# Patient Record
Sex: Female | Born: 1940 | Race: White | Hispanic: No | Smoking: Former smoker
Health system: Southern US, Community
[De-identification: ages and names within clinical notes are randomized; demographics above are authoritative.]

## PROBLEM LIST (undated history)

## (undated) DIAGNOSIS — R911 Solitary pulmonary nodule: Secondary | ICD-10-CM

## (undated) DIAGNOSIS — I10 Essential (primary) hypertension: Secondary | ICD-10-CM

## (undated) DIAGNOSIS — I639 Cerebral infarction, unspecified: Secondary | ICD-10-CM

## (undated) DIAGNOSIS — E079 Disorder of thyroid, unspecified: Secondary | ICD-10-CM

## (undated) DIAGNOSIS — J45909 Unspecified asthma, uncomplicated: Secondary | ICD-10-CM

## (undated) DIAGNOSIS — IMO0001 Reserved for inherently not codable concepts without codable children: Secondary | ICD-10-CM

## (undated) HISTORY — PX: ABDOMINAL HYSTERECTOMY: SHX81

## (undated) HISTORY — PX: BREAST SURGERY: SHX581

## (undated) HISTORY — DX: Reserved for inherently not codable concepts without codable children: IMO0001

## (undated) HISTORY — DX: Cerebral infarction, unspecified: I63.9

## (undated) HISTORY — PX: VENTRICULOPERITONEAL SHUNT: SHX204

## (undated) HISTORY — DX: Solitary pulmonary nodule: R91.1

---

## 2005-10-25 ENCOUNTER — Ambulatory Visit: Payer: Self-pay | Admitting: Family Medicine

## 2005-11-11 ENCOUNTER — Ambulatory Visit: Payer: Self-pay | Admitting: Family Medicine

## 2005-11-14 ENCOUNTER — Ambulatory Visit: Payer: Self-pay | Admitting: Internal Medicine

## 2006-06-24 DIAGNOSIS — M545 Low back pain: Secondary | ICD-10-CM

## 2006-06-24 DIAGNOSIS — I10 Essential (primary) hypertension: Secondary | ICD-10-CM

## 2006-06-24 DIAGNOSIS — D510 Vitamin B12 deficiency anemia due to intrinsic factor deficiency: Secondary | ICD-10-CM | POA: Insufficient documentation

## 2006-06-24 DIAGNOSIS — R259 Unspecified abnormal involuntary movements: Secondary | ICD-10-CM | POA: Insufficient documentation

## 2006-06-24 DIAGNOSIS — E7800 Pure hypercholesterolemia, unspecified: Secondary | ICD-10-CM

## 2007-07-31 ENCOUNTER — Ambulatory Visit: Payer: Self-pay | Admitting: Family Medicine

## 2007-08-03 ENCOUNTER — Telehealth: Payer: Self-pay | Admitting: Family Medicine

## 2007-08-05 ENCOUNTER — Encounter: Admission: RE | Admit: 2007-08-05 | Discharge: 2007-08-05 | Payer: Self-pay | Admitting: Family Medicine

## 2007-08-18 ENCOUNTER — Ambulatory Visit: Payer: Self-pay | Admitting: Family Medicine

## 2007-08-25 ENCOUNTER — Telehealth: Payer: Self-pay | Admitting: Family Medicine

## 2007-08-25 ENCOUNTER — Telehealth (INDEPENDENT_AMBULATORY_CARE_PROVIDER_SITE_OTHER): Payer: Self-pay | Admitting: *Deleted

## 2007-08-25 DIAGNOSIS — R928 Other abnormal and inconclusive findings on diagnostic imaging of breast: Secondary | ICD-10-CM | POA: Insufficient documentation

## 2007-10-23 ENCOUNTER — Ambulatory Visit: Payer: Self-pay | Admitting: Family Medicine

## 2007-11-24 ENCOUNTER — Ambulatory Visit: Payer: Self-pay | Admitting: Family Medicine

## 2007-11-27 ENCOUNTER — Encounter: Payer: Self-pay | Admitting: Family Medicine

## 2007-11-27 ENCOUNTER — Telehealth (INDEPENDENT_AMBULATORY_CARE_PROVIDER_SITE_OTHER): Payer: Self-pay | Admitting: *Deleted

## 2007-11-30 ENCOUNTER — Encounter: Payer: Self-pay | Admitting: Family Medicine

## 2007-11-30 ENCOUNTER — Telehealth (INDEPENDENT_AMBULATORY_CARE_PROVIDER_SITE_OTHER): Payer: Self-pay | Admitting: *Deleted

## 2007-11-30 LAB — CONVERTED CEMR LAB
ALT: 18 units/L (ref 0–35)
AST: 15 units/L (ref 0–37)
Albumin: 4.5 g/dL (ref 3.5–5.2)
Alkaline Phosphatase: 54 units/L (ref 39–117)
Chloride: 95 meq/L — ABNORMAL LOW (ref 96–112)
Estradiol: 58.6 pg/mL
Free T4: 0.68 ng/dL — ABNORMAL LOW (ref 0.89–1.80)
LDL Cholesterol: 144 mg/dL — ABNORMAL HIGH (ref 0–99)
MCHC: 33.7 g/dL (ref 30.0–36.0)
Platelets: 335 10*3/uL (ref 150–400)
Potassium: 4.2 meq/L (ref 3.5–5.3)
RDW: 13.2 % (ref 11.5–15.5)
Sodium: 131 meq/L — ABNORMAL LOW (ref 135–145)
T3, Free: 2.8 pg/mL (ref 2.3–4.2)
TSH: 9.265 microintl units/mL — ABNORMAL HIGH (ref 0.350–5.50)
Total CHOL/HDL Ratio: 5.3
Total Protein: 7.1 g/dL (ref 6.0–8.3)
VLDL: 48 mg/dL — ABNORMAL HIGH (ref 0–40)

## 2007-12-04 ENCOUNTER — Ambulatory Visit: Payer: Self-pay | Admitting: Family Medicine

## 2007-12-04 DIAGNOSIS — E871 Hypo-osmolality and hyponatremia: Secondary | ICD-10-CM | POA: Insufficient documentation

## 2007-12-04 DIAGNOSIS — E039 Hypothyroidism, unspecified: Secondary | ICD-10-CM | POA: Insufficient documentation

## 2007-12-05 ENCOUNTER — Encounter: Payer: Self-pay | Admitting: Family Medicine

## 2007-12-07 LAB — CONVERTED CEMR LAB: Vitamin B-12: 1528 pg/mL — ABNORMAL HIGH (ref 211–911)

## 2007-12-15 ENCOUNTER — Telehealth (INDEPENDENT_AMBULATORY_CARE_PROVIDER_SITE_OTHER): Payer: Self-pay | Admitting: *Deleted

## 2007-12-18 ENCOUNTER — Ambulatory Visit: Payer: Self-pay | Admitting: Family Medicine

## 2008-01-08 ENCOUNTER — Ambulatory Visit: Payer: Self-pay | Admitting: Family Medicine

## 2008-01-08 DIAGNOSIS — R5381 Other malaise: Secondary | ICD-10-CM | POA: Insufficient documentation

## 2008-01-08 DIAGNOSIS — F329 Major depressive disorder, single episode, unspecified: Secondary | ICD-10-CM

## 2008-01-08 DIAGNOSIS — R5383 Other fatigue: Secondary | ICD-10-CM

## 2008-01-11 ENCOUNTER — Telehealth: Payer: Self-pay | Admitting: Family Medicine

## 2008-01-12 LAB — CONVERTED CEMR LAB: Testosterone Free: 6.5 pg/mL (ref 0.6–6.8)

## 2008-11-03 ENCOUNTER — Telehealth: Payer: Self-pay | Admitting: Family Medicine

## 2008-11-04 ENCOUNTER — Ambulatory Visit: Payer: Self-pay | Admitting: Family Medicine

## 2009-03-07 ENCOUNTER — Encounter: Payer: Self-pay | Admitting: Family Medicine

## 2009-03-07 LAB — CONVERTED CEMR LAB: Cholesterol: 211 mg/dL

## 2009-10-06 ENCOUNTER — Ambulatory Visit: Payer: Self-pay | Admitting: Diagnostic Radiology

## 2009-10-06 ENCOUNTER — Emergency Department (HOSPITAL_BASED_OUTPATIENT_CLINIC_OR_DEPARTMENT_OTHER): Admission: EM | Admit: 2009-10-06 | Discharge: 2009-10-06 | Payer: Self-pay | Admitting: Emergency Medicine

## 2009-10-16 ENCOUNTER — Ambulatory Visit: Payer: Self-pay | Admitting: Family Medicine

## 2010-02-27 ENCOUNTER — Encounter: Payer: Self-pay | Admitting: Family Medicine

## 2010-10-16 NOTE — Assessment & Plan Note (Signed)
Summary: Breast lesion, mood, HTN   Vital Signs:  Patient profile:   70 year old female Height:      64 inches Weight:      166 pounds BMI:     28.60 Pulse rate:   85 / minute BP sitting:   120 / 67  (left arm) Cuff size:   regular  Vitals Entered By: Kathlene November (October 16, 2009 10:59 AM) CC: cyst on right breast   Primary Care Provider:  Linford Arnold, C  CC:  cyst on right breast.  History of Present Illness: cyst on right breast.  Had a bx on this lesions previously that was benign. Has had a knot there ever since the bx. Feels it may be changing but not sure.  Last mammo in  2009. No pain or tenderness.    Has lost 13 pounds without  exercising. Getting over bronchitis adn would like me to check her lungs. COmpleted her ABX. Also given steroid but never had that rx filled because her it could cause sexual dysfunction.   Has been feelingmore anxious. Her renter in [place] broke their rental contract and so now she has to go adn work on the house adn try to find another renter. Already on clonazepam for sleep but says has been using someduring the day, it just makes her feel sleepy. Says she can't remember who really prescibes that med for her.   Contraindications/Deferment of Procedures/Staging:    Test/Procedure: FLU VAX    Reason for deferment: patient declined   Current Medications (verified): 1)  Clonazepam 0.5 Mg  Tabs (Clonazepam) 2)  Hyrdochlorothiazide .... Take One Tablet By Mouth Daily 3)  Lisinopril .... Take 1/2 Tablet By Mouth Once A Day 4)  Depo-Estradiol 5 Mg/ml  Oil (Estradiol Cypionate) .... 0.5mg  Im Q 3-4 Weeks. 5)  Synthroid 50 Mcg  Tabs (Levothyroxine Sodium) .... Take 1 Tablet By Mouth Once A Day 6)  Lipitor 40 Mg Tab (Atorvastatin Calcium) .... Take 1 Tablet By Mouth Each Evening 7)  Naltrexone Hcl 50 Mg Tabs (Naltrexone Hcl) .... Take One Tablet By Mouth At Bedtime 8)  Fish Oil Capsules 2000mg  .... Take One Tablet By Mouth Ion The Mornings and One Tablet By  Mouth in The Evenings 9)  Testim 1 % Gel (Testosterone) .... Apply 1/4 Cc Daily  Allergies (verified): 1)  ! Penicillamine (Penicillamine) 2)  ! Sulfa  Comments:  Nurse/Medical Assistant: The patient's medications and allergies were reviewed with the patient and were updated in the Medication and Allergy Lists.  Past History:  Past Surgical History: Last updated: 07/01/2006 breast reduction,  Hystectomy and oophorectomy age 56  Physical Exam  General:  Well-developed,well-nourished,in no acute distress; alert,appropriate and cooperative throughout examination Head:  Normocephalic and atraumatic without obvious abnormalities. No apparent alopecia or balding. Breasts:  No mass, nodules, thickening, tenderness, bulging, retraction, inflamation, nipple discharge notes. She does have dense fibrocystic breats and does have some well healed scars from her breast reduction.  Lungs:  Normal respiratory effort, chest expands symmetrically. Lungs are clear to auscultation, no crackles or wheezes. Heart:  Normal rate and regular rhythm. S1 and S2 normal without gallop, murmur, click, rub or other extra sounds. Skin:  no rashes.   Psych:  Cognition and judgment appear intact. Alert and cooperative with normal attention span and concentration. No apparent delusions, illusions, hallucinations   Impression & Recommendations:  Problem # 1:  ABNORMAL MAMMOGRAM (ICD-793.80)  Dsicussed that I don't feel any changes on her exam but  she is 2 years overdue for her mammogram. Will refer for diagnostic mammogram for further imaging of the area since she feels is has changed. Area is in the right breaset,  Upper inner quadrant that she is worried about.   Orders: T-Mammography, Diagnostic (bilateral) (16109)  Problem # 2:  DEPRESSION (ICD-311) Discussed that we could start an SSR if she would like to control her sxs. I don't rx her clonazepam so will have to call the provider who writes that for her if  feels her dose needs to be adjusted. Let her know if may take 2-3 weeks for the citalopram to start working. Have to be patient with it. calli fsedating or drastic change in mood.  Her updated medication list for this problem includes:    Clonazepam 0.5 Mg Tabs (Clonazepam)    Citalopram Hydrobromide 20 Mg Tabs (Citalopram hydrobromide) .Marland Kitchen... 1/2 tab by mouth daily  for one week, then increase to whole tab daily  Problem # 3:  HYPERTENSION, BENIGN SYSTEMIC (ICD-401.1) Looks great. She brought in a copy of her most recent labs today. I am not really sure who is following this for her. She has multiple MDs, but her last K, CR in June were normal per her labs and she is well controlled.  Her updated medication list for this problem includes:    Lisinopril 10 Mg Tabs (Lisinopril) .Marland Kitchen... Take 1/2 tablet by mouth once a day  Complete Medication List: 1)  Clonazepam 0.5 Mg Tabs (Clonazepam) 2)  Hyrdochlorothiazide  .... Take one tablet by mouth daily 3)  Lisinopril 10 Mg Tabs (Lisinopril) .... Take 1/2 tablet by mouth once a day 4)  Depo-estradiol 5 Mg/ml Oil (Estradiol cypionate) .... 0.5mg  im q 3-4 weeks. 5)  Synthroid 50 Mcg Tabs (Levothyroxine sodium) .... Take 1 tablet by mouth once a day 6)  Lipitor 40 Mg Tab (Atorvastatin calcium) .... Take 1 tablet by mouth each evening 7)  Naltrexone Hcl 50 Mg Tabs (Naltrexone hcl) .... Take one tablet by mouth at bedtime 8)  Fish Oil Capsules 2000mg   .... Take one tablet by mouth ion the mornings and one tablet by mouth in the evenings 9)  Testim 1 % Gel (Testosterone) .... Apply 1/4 cc daily 10)  Citalopram Hydrobromide 20 Mg Tabs (Citalopram hydrobromide) .... 1/2 tab by mouth daily  for one week, then increase to whole tab daily  Other Orders: Pneumococcal Vaccine (60454) Admin 1st Vaccine (09811) Prescriptions: CITALOPRAM HYDROBROMIDE 20 MG TABS (CITALOPRAM HYDROBROMIDE) 1/2 tab by mouth daily  for one week, then increase to whole tab daily  #30 x 0    Entered and Authorized by:   Nani Gasser MD   Signed by:   Nani Gasser MD on 10/16/2009   Method used:   Print then Give to Patient   RxID:   [id number]   Flu Vaccine Next Due:  Not Indicated TD Result Date:  10/02/2009 TD Result:  given TD Next Due:  10 yr PAP Next Due:  Not Indicated   Immunizations Administered:  Pneumonia Vaccine:    Vaccine Type: Pneumovax    Site: right buttock    Mfr: Merck    Dose: 0.5 ml    Route: IM    Given by: Kathlene November    Exp. Date: 10/13/2010    Lot #: [id number]    VIS given: 04/13/96 version given October 16, 2009.   Lipid Panel Test Date: 03/07/2009  Value        Units        H/L   Reference  Cholesterol:          211          mg/dL              (045-409) LDL Cholesterol:      115          mg/dL              (81-191) HDL Cholesterol:      48           mg/dL              (47-82) Triglyceride:         239          mg/dL              (95-621)  Lab name:             [facility]

## 2011-01-10 ENCOUNTER — Encounter: Payer: Self-pay | Admitting: Family Medicine

## 2011-01-10 ENCOUNTER — Telehealth: Payer: Self-pay | Admitting: Family Medicine

## 2011-01-10 NOTE — Telephone Encounter (Signed)
Pt notified and was advised to try allegra and otc cortisone

## 2011-01-10 NOTE — Telephone Encounter (Signed)
Patient has an appt tomorrow for itching but is there something she can take OTC til tomorrow?

## 2011-01-11 ENCOUNTER — Ambulatory Visit (INDEPENDENT_AMBULATORY_CARE_PROVIDER_SITE_OTHER): Payer: Medicare Other | Admitting: Family Medicine

## 2011-01-11 ENCOUNTER — Encounter: Payer: Self-pay | Admitting: Family Medicine

## 2011-01-11 VITALS — BP 127/78 | HR 95 | Temp 98.3°F | Ht 64.0 in | Wt 175.0 lb

## 2011-01-11 DIAGNOSIS — L282 Other prurigo: Secondary | ICD-10-CM

## 2011-01-11 NOTE — Patient Instructions (Addendum)
Stop the nexium and go back to your prilosec for now.   She can schedule a physical any time.

## 2011-01-11 NOTE — Progress Notes (Signed)
  Subjective:    Patient ID: Kathleen Dunn, female    DOB: 26-Apr-1941, 70 y.o.   MRN: [id number]  HPI Iting on her legs, abdomen, neck and upper legs for over 2 weeks.  No rash. No new lotions, soaps, perfumes, fabric softeners, etc.  She is taking vitamin D. Start nexium on 11/29/10.  She is not using any creams or lotions on it. No worsening or alleviating symptoms.  She is also seeing a naturopath. She ws put on Betaine but had a reaction, but can't remember what the reaction was.  She has had a lot of gas and bloating.  She is already taking vit D 1000 and a MVI.     Review of Systems     Objective:   Physical Exam  Constitutional: She appears well-developed and well-nourished.  Cardiovascular: Normal rate, regular rhythm and normal heart sounds.   Pulmonary/Chest: Effort normal and breath sounds normal.  Skin:       Very faint erythematous macules on her upper chest neck and arms. Several excoriations especially of her forearms and around her neck.   BP 127/78  Pulse 95  Temp(Src) 98.3 F (36.8 C) (Oral)  Ht 5\' 4"  (1.626 m)  Wt 175 lb (79.379 kg)  BMI 30.04 kg/m2  SpO2 96%        Assessment & Plan:  Likely hives from some type of allergic reaction. Interestingly this did start after starting the Prilosec but doesn't quite look like a drug reaction. Certainly it could be causing itching. I recommended that she get back to her omeprazole for now. The rash seems to be getting better the last week so she can continue conservative care. It is not completely resolved in the next 10 days and she cannot wean.

## 2011-01-14 ENCOUNTER — Encounter: Payer: Self-pay | Admitting: Family Medicine

## 2011-05-17 ENCOUNTER — Telehealth: Payer: Self-pay | Admitting: Family Medicine

## 2011-05-17 ENCOUNTER — Ambulatory Visit (INDEPENDENT_AMBULATORY_CARE_PROVIDER_SITE_OTHER): Payer: Medicare Other | Admitting: Family Medicine

## 2011-05-17 ENCOUNTER — Encounter: Payer: Self-pay | Admitting: Family Medicine

## 2011-05-17 ENCOUNTER — Ambulatory Visit
Admission: RE | Admit: 2011-05-17 | Discharge: 2011-05-17 | Disposition: A | Discharge status: Home / Self Care | Payer: Medicare Other | Source: Ambulatory Visit | Attending: Family Medicine | Admitting: Family Medicine

## 2011-05-17 VITALS — BP 118/68 | HR 89 | Wt 170.0 lb

## 2011-05-17 DIAGNOSIS — M25569 Pain in unspecified knee: Secondary | ICD-10-CM

## 2011-05-17 MED ORDER — ESOMEPRAZOLE MAGNESIUM 40 MG PO CPDR: 40.0000 mg | DELAYED_RELEASE_CAPSULE | Freq: Every day | ORAL | Status: DC

## 2011-05-17 MED ORDER — ATORVASTATIN CALCIUM 40 MG PO TABS: 20.0000 mg | ORAL_TABLET | Freq: Every day | ORAL | Status: DC

## 2011-05-17 MED ORDER — MOMETASONE FUROATE 50 MCG/ACT NA SUSP: 2.0000 | Freq: Every day | NASAL | Status: DC | PRN

## 2011-05-17 NOTE — Telephone Encounter (Signed)
Pt.notified

## 2011-05-17 NOTE — Telephone Encounter (Signed)
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  No fracture.  Recommend ice, elevate and ACE wrap.  If not improving then can consider injection in the knee if would like.

## 2011-05-17 NOTE — Progress Notes (Signed)
  Subjective:    Patient ID: Kathleen Dunn, female    DOB: Aug 23, 1941, 70 y.o.   MRN: [id number]  HPI LT knee swollen for about 2 days.  Did have an injury to the knee during a fall in [place] when tripped over a phone cord and fell on her knees.  Feel again on her knees on some step in July.  Pian is mild.  She is more concerned about the swelling itself. No redness. Her right knee does not seem to be swelling as much. She has not really taken any pain relievers for it. No worsening or alleviating symptoms. She does note when she's been standing up for long periods such as doing dishes etc. that it will bulge more.  Review of Systems     Objective:   Physical Exam Lt knee with no crepitus.  NROM. Pain with anterior drawer. I was unable to perform a true anterior drawer because patient would extend her leg every time I try. TEnder along the medial and lateal joint lines.  Non patellar tenderness. No pain with full flexion or full extension. She does have some trace edema. No erythema. No increased warmth.       Assessment & Plan:  Lt knee pain/swelling: Will get xray. Will call with results. I suspect she likely has arthritis. She doesn't have any significant pain at this time. PRICE therapy. Can take an Aleve at bedtime if needed. Make sure to take the Aleve with food and water to avoid GI irritation.  She has several other complaints she would like to discuss such as fatigue. I recommended she make a followup appointment to discuss this further. She was actually double booked today for an acute appointment.

## 2011-05-21 ENCOUNTER — Ambulatory Visit: Payer: Medicare Other | Admitting: Family Medicine

## 2011-05-21 ENCOUNTER — Telehealth: Payer: Self-pay | Admitting: Family Medicine

## 2011-05-21 NOTE — Telephone Encounter (Signed)
Pt.calling and had recent knee xray and needs to know when applying the ace wrap that she was given does she apply over the knee only or the knee and the swelling below the knee. Jarvis Newcomer, LPN [facility]

## 2011-05-27 ENCOUNTER — Telehealth: Payer: Self-pay | Admitting: Family Medicine

## 2011-05-27 NOTE — Telephone Encounter (Signed)
ACE wrap starting below the knee cap and then continue until above the knee cap.

## 2011-05-27 NOTE — Telephone Encounter (Signed)
Pt had called in last week 05-21-11 and asked if she should apply ace wrap to both the knee and below where the swelling was or just on the knee.  When reviewing no reponse.  Please advise. Plan:  Routed to the provider. Jarvis Newcomer, LPN [facility]

## 2011-05-27 NOTE — Telephone Encounter (Signed)
Pt informed. [place] Retana, LPN /Triage  

## 2011-05-29 ENCOUNTER — Other Ambulatory Visit: Payer: Self-pay | Admitting: Family Medicine

## 2011-05-29 MED ORDER — BECLOMETHASONE DIPROPIONATE 40 MCG/ACT IN AERS: 2.0000 | INHALATION_SPRAY | Freq: Two times a day (BID) | RESPIRATORY_TRACT | Status: DC

## 2011-05-29 NOTE — Telephone Encounter (Signed)
Pt called and needs refill of her QVAR since she is out.  Trying to get all her meds switched over since moving here.  [facility].  Pt also is saying went sent nasonex to [facility] for her and whoever sent only sent for #1 nasonex and what happened is she was charged the same as if she had gotten 90 day supply.  Plan:  QVAR Medication [facility].  Also told pt to have [facility] call the triage nurse when she talks with them and triage nurse will update the script to a 90 day supply if they will do. Jarvis Newcomer, LPN [facility]

## 2011-05-30 ENCOUNTER — Telehealth: Payer: Self-pay | Admitting: Family Medicine

## 2011-05-30 NOTE — Telephone Encounter (Signed)
[facility] sent correspondence regarding nasonex sent on 05-17-11 from our office electronically.  This is a mail order and only 1 17 gram nasonex was sent. Plan:  Called and spoke with the pharmacist and gave 90 day supply. Jarvis Newcomer, LPN [facility]

## 2011-06-14 ENCOUNTER — Ambulatory Visit: Admitting: Family Medicine

## 2011-06-14 ENCOUNTER — Other Ambulatory Visit: Payer: Self-pay | Admitting: *Deleted

## 2011-06-14 MED ORDER — CLONAZEPAM 0.5 MG PO TABS: 0.5000 mg | ORAL_TABLET | Freq: Two times a day (BID) | ORAL | Status: DC | PRN

## 2011-06-25 ENCOUNTER — Other Ambulatory Visit: Payer: Self-pay | Admitting: *Deleted

## 2011-06-25 MED ORDER — TRIAMTERENE-HCTZ 37.5-25 MG PO CAPS: 1.0000 | ORAL_CAPSULE | ORAL | Status: DC

## 2011-06-25 NOTE — Telephone Encounter (Signed)
Pt called and wants her BP med sent to [facility]. Plan:  Told the pt to wait til she at least pup 30 day supply at the local pharm and then call me back and I will transfer script to [facility] where she prefers for it to be. Jarvis Newcomer, LPN [facility]

## 2011-07-08 ENCOUNTER — Telehealth: Payer: Self-pay | Admitting: Family Medicine

## 2011-07-08 ENCOUNTER — Other Ambulatory Visit: Payer: Self-pay | Admitting: *Deleted

## 2011-07-08 MED ORDER — TRIAMTERENE-HCTZ 37.5-25 MG PO CAPS: 1.0000 | ORAL_CAPSULE | ORAL | Status: DC

## 2011-07-08 MED ORDER — BECLOMETHASONE DIPROPIONATE 40 MCG/ACT IN AERS: 2.0000 | INHALATION_SPRAY | Freq: Two times a day (BID) | RESPIRATORY_TRACT | Status: DC

## 2011-07-08 NOTE — Telephone Encounter (Signed)
Pt called and left her number and asked for a nurse to return her call. Plan:  Pt call was returned but had to [facility] telling pt they could call back at their convenience. Jarvis Newcomer, LPN [facility]

## 2011-07-09 ENCOUNTER — Telehealth: Payer: Self-pay | Admitting: Family Medicine

## 2011-07-09 DIAGNOSIS — R7989 Other specified abnormal findings of blood chemistry: Secondary | ICD-10-CM

## 2011-07-09 DIAGNOSIS — E039 Hypothyroidism, unspecified: Secondary | ICD-10-CM

## 2011-07-09 MED ORDER — TRIAMTERENE-HCTZ 37.5-25 MG PO CAPS: 1.0000 | ORAL_CAPSULE | ORAL | Status: DC

## 2011-07-09 MED ORDER — CLONAZEPAM 1 MG PO TABS: 1.0000 mg | ORAL_TABLET | Freq: Two times a day (BID) | ORAL | Status: DC | PRN

## 2011-07-09 MED ORDER — LEVOTHYROXINE SODIUM 50 MCG PO TABS: 50.0000 ug | ORAL_TABLET | Freq: Every day | ORAL | Status: DC

## 2011-07-09 NOTE — Telephone Encounter (Signed)
Pt calling and saying the amount sent on Triam/HCTZ 37.5/25 mg is incorrect.  Pt wants local and mail order for this medication.  Corrected this problem. Jarvis Newcomer, LPN [facility]   Pt also says the mg of 0.5 on the klonopin  Is incorrect.  She has always gotten this medication as 1 mg twice daily PRN since she has been taking it.  Feels she might have told the doctor the wrong dose when she became a pt here.  Dr. Lawerance Bach gave and this is the way it has always been ordered.  Please advise on this med and if it can be changed and if so, pt needs [facility] to [facility].    Pt also requested a refill to go to [facility] for her levothyroxine, but pt hasn't had labs since 2009.  Will only do 30 day supply once a lab appt is made.   Will notify the pt about this problem.  Ordered TSH level for the pt and instructed her to go to lab to have TSH checked before add'l refills can be sent for her thyroid med.  Only sent a 30 day supply in the interim until levels obtained. Jarvis Newcomer, LPN [facility]

## 2011-07-09 NOTE — Telephone Encounter (Signed)
Medication dose changed and printed out and faxed to pt pharm. Jarvis Newcomer, LPN [facility]

## 2011-07-09 NOTE — Telephone Encounter (Signed)
OK to adjust the klonopin dose.

## 2011-07-15 ENCOUNTER — Other Ambulatory Visit: Payer: Self-pay | Admitting: Family Medicine

## 2011-07-15 NOTE — Telephone Encounter (Signed)
[place] with [facility] called and said she needed to obtain refills for this pt for her progesterone and lipitor medications. Plan:  Reviewed the pt chart file and the last time pt was seen in our office was 10-16-09 for HTN.  Pt has not had a CPE that could be found.  No labs for cholesterol since 09-2009.  Pt needs to schedule an appt before any refills for 30 day supply will be sent.  Cannot do 90 day supplies until after appt satisfied along with fasting labwork.  Pt informed that Dr. Linford Arnold wanted her to schedule a CPE back in 12-2010.  Pt stated she didn't even needs refills at current. Jarvis Newcomer, LPN [facility]

## 2011-07-19 ENCOUNTER — Other Ambulatory Visit: Payer: Self-pay | Admitting: *Deleted

## 2011-07-19 MED ORDER — PROGESTERONE MICRONIZED 100 MG PO CAPS: 100.0000 mg | ORAL_CAPSULE | Freq: Every day | ORAL | Status: DC

## 2011-07-19 MED ORDER — ATORVASTATIN CALCIUM 40 MG PO TABS: 20.0000 mg | ORAL_TABLET | Freq: Every day | ORAL | Status: DC

## 2011-09-02 ENCOUNTER — Other Ambulatory Visit: Payer: Self-pay | Admitting: Family Medicine

## 2011-09-04 ENCOUNTER — Other Ambulatory Visit: Payer: Self-pay | Admitting: *Deleted

## 2011-09-04 MED ORDER — CLONAZEPAM 1 MG PO TABS: 1.0000 mg | ORAL_TABLET | Freq: Two times a day (BID) | ORAL | Status: DC | PRN

## 2011-09-05 ENCOUNTER — Other Ambulatory Visit: Payer: Self-pay | Admitting: *Deleted

## 2011-09-05 MED ORDER — CLONAZEPAM 1 MG PO TABS: 1.0000 mg | ORAL_TABLET | Freq: Two times a day (BID) | ORAL | Status: DC | PRN

## 2011-09-13 ENCOUNTER — Other Ambulatory Visit: Payer: Self-pay | Admitting: Family Medicine

## 2011-09-16 ENCOUNTER — Ambulatory Visit: Payer: Medicare Other | Admitting: Family Medicine

## 2011-09-25 ENCOUNTER — Other Ambulatory Visit: Payer: Self-pay | Admitting: Family Medicine

## 2011-11-06 ENCOUNTER — Other Ambulatory Visit: Payer: Self-pay | Admitting: *Deleted

## 2011-11-06 MED ORDER — LEVOTHYROXINE SODIUM 50 MCG PO TABS: 50.0000 ug | ORAL_TABLET | Freq: Every day | ORAL | Status: DC

## 2011-11-06 MED ORDER — CLONAZEPAM 1 MG PO TABS: 1.0000 mg | ORAL_TABLET | Freq: Two times a day (BID) | ORAL | Status: DC | PRN

## 2011-11-06 MED ORDER — ESOMEPRAZOLE MAGNESIUM 40 MG PO CPDR: 40.0000 mg | DELAYED_RELEASE_CAPSULE | Freq: Every day | ORAL | Status: DC

## 2011-11-08 ENCOUNTER — Other Ambulatory Visit: Payer: Self-pay | Admitting: Family Medicine

## 2011-11-08 ENCOUNTER — Telehealth: Payer: Self-pay | Admitting: Family Medicine

## 2011-11-08 ENCOUNTER — Ambulatory Visit (INDEPENDENT_AMBULATORY_CARE_PROVIDER_SITE_OTHER): Admitting: Family Medicine

## 2011-11-08 ENCOUNTER — Encounter: Payer: Self-pay | Admitting: Family Medicine

## 2011-11-08 VITALS — BP 117/66 | HR 87 | Wt 177.0 lb

## 2011-11-08 DIAGNOSIS — I10 Essential (primary) hypertension: Secondary | ICD-10-CM

## 2011-11-08 DIAGNOSIS — E039 Hypothyroidism, unspecified: Secondary | ICD-10-CM

## 2011-11-08 DIAGNOSIS — F329 Major depressive disorder, single episode, unspecified: Secondary | ICD-10-CM

## 2011-11-08 DIAGNOSIS — E538 Deficiency of other specified B group vitamins: Secondary | ICD-10-CM

## 2011-11-08 MED ORDER — HYDROXYZINE HCL 10 MG PO TABS: 10.0000 mg | ORAL_TABLET | Freq: Every evening | ORAL | Status: AC | PRN

## 2011-11-08 MED ORDER — AMBULATORY NON FORMULARY MEDICATION: Status: DC

## 2011-11-08 MED ORDER — BUPROPION HCL ER (SR) 150 MG PO TB12: 150.0000 mg | ORAL_TABLET | Freq: Two times a day (BID) | ORAL | Status: DC

## 2011-11-08 NOTE — Progress Notes (Signed)
Subjective:    Patient ID: Kathleen Dunn, female    DOB: 1941-03-09, 71 y.o.   MRN: [id number]  Hypertension This is a chronic problem. The current episode started more than 1 year ago. The problem is unchanged. The problem is controlled. Pertinent negatives include no chest pain or shortness of breath. Agents associated with hypertension include NSAIDs. Risk factors for coronary artery disease include no known risk factors. Past treatments include ACE inhibitors. There are no compliance problems.    she has not been taking her lisinopril. She's been off of it for several weeks.   Sharp pain and drainage in her left ear. She holds her phone in that ear. She has been using vinegar in that ear.   She is stilll having vaginal dryness even though on hormones.  Chest chest and abdomen and moisturizers and says she doesn't like them. She does admit she is very inconsistent with her hormones.  Review of Systems  Respiratory: Negative for shortness of breath.   Cardiovascular: Negative for chest pain.       Objective:   Physical Exam  Constitutional: She is oriented to person, place, and time. She appears well-developed and well-nourished.  HENT:  Head: Normocephalic and atraumatic.  Right Ear: External ear normal.  Left Ear: External ear normal.  Nose: Nose normal.  Mouth/Throat: Oropharynx is clear and moist.       TMs and canals are clear.   Eyes: Conjunctivae and EOM are normal. Pupils are equal, round, and reactive to light.  Neck: Neck supple. No thyromegaly present.  Cardiovascular: Normal rate, regular rhythm and normal heart sounds.   Pulmonary/Chest: Effort normal and breath sounds normal. She has no wheezes.  Lymphadenopathy:    She has no cervical adenopathy.  Neurological: She is alert and oriented to person, place, and time.  Skin: Skin is warm and dry.  Psychiatric: She has a normal mood and affect.          Assessment & Plan:  Hypertension-her blood pressure  looks fantastic today. I told her to ahead and discontinue the lisinopril completely since she is RA not been taking it for a couple weeks and her blood pressure looks great. She says this time her blood pressure does elevate taking a half of a Klonopin seems to bring it down immediately. Currently she is only taking half of a metoprolol. This had to adjust the dosing on the bottle. She is due for a CMP and lipid panel. Though she is not fasting today wants to hold off on lipid panel. She is taking her diuretic twice a day. I do want to check and make sure that her renal function is normal on this. If it's elevated then consider decreasing to once a day.  Hypothyroidism-we will check a TSH today.  Otalgia-her ear exam is normal today. Some of her pressure may be due to allergic rhinitis. She can start the Nasonex and see if this helps. Next  5 B12 deficiency-we'll recheck her level today. She says she gives herself shots 3 times per week. This seems to frequent bouts of am not clear on what dosage she is actually administering to herself.  Depression-overall she feels she's doing well. She did want a refill on the Wellbutrin, though she admits she does not take it regularly. She is still mostly taking half a tab of her Klonopin at bedtime for insomnia.  Vaginal dryness-I discussed with her that the best treatment at this point is symptom moisturizers such as Replens.  I do not recommend adjusting her hormone therapy. We also discussed additional sac seen. She says she will think about it right now she feels she is primarily not interested.

## 2011-11-08 NOTE — Telephone Encounter (Signed)
This to her I would like to see her back in one month to make sure she is doing well.

## 2011-11-11 ENCOUNTER — Other Ambulatory Visit: Payer: Self-pay | Admitting: Family Medicine

## 2011-11-11 LAB — BASIC METABOLIC PANEL
CO2: 28 mEq/L (ref 19–32)
Calcium: 9.9 mg/dL (ref 8.4–10.5)
Creat: 0.96 mg/dL (ref 0.50–1.10)
Glucose, Bld: 75 mg/dL (ref 70–99)

## 2011-11-11 MED ORDER — TRIAMTERENE-HCTZ 37.5-25 MG PO CAPS: 1.0000 | ORAL_CAPSULE | ORAL | Status: DC

## 2011-11-11 NOTE — Telephone Encounter (Signed)
LM on VM for pt to schedule f/u appt.

## 2011-11-14 ENCOUNTER — Telehealth: Payer: Self-pay | Admitting: *Deleted

## 2011-11-14 DIAGNOSIS — M545 Low back pain: Secondary | ICD-10-CM

## 2011-11-14 MED ORDER — MOMETASONE FUROATE 50 MCG/ACT NA SUSP: 2.0000 | Freq: Every day | NASAL | Status: DC | PRN

## 2011-11-14 MED ORDER — TRAMADOL HCL 50 MG PO TABS: 50.0000 mg | ORAL_TABLET | Freq: Four times a day (QID) | ORAL | Status: DC | PRN

## 2011-11-14 MED ORDER — ESOMEPRAZOLE MAGNESIUM 40 MG PO CPDR: 40.0000 mg | DELAYED_RELEASE_CAPSULE | Freq: Every day | ORAL | Status: DC

## 2011-11-14 NOTE — Telephone Encounter (Signed)
Having back pain ? Twisted it the other night when getting out of the tub- pt states it is a achy burn feeling now. Pt has been taking Ibuprofen for it but just isn't helping that much. Pt states would like to know if she can have an order for PT like she has done in the past when this has bothered her. Request a referral for PT but in [place] near her house.

## 2011-11-14 NOTE — Telephone Encounter (Signed)
Addended by: Nani Gasser D on: 11/14/2011 03:25 PM   Modules accepted: Orders

## 2011-11-14 NOTE — Telephone Encounter (Signed)
Will place PT order.

## 2011-12-09 ENCOUNTER — Encounter: Payer: Self-pay | Admitting: Family Medicine

## 2011-12-09 ENCOUNTER — Ambulatory Visit (INDEPENDENT_AMBULATORY_CARE_PROVIDER_SITE_OTHER): Admitting: Family Medicine

## 2011-12-09 ENCOUNTER — Other Ambulatory Visit: Payer: Self-pay | Admitting: Family Medicine

## 2011-12-09 VITALS — BP 125/69 | HR 99 | Ht 64.0 in | Wt 176.0 lb

## 2011-12-09 DIAGNOSIS — G4700 Insomnia, unspecified: Secondary | ICD-10-CM | POA: Diagnosis not present

## 2011-12-09 DIAGNOSIS — I10 Essential (primary) hypertension: Secondary | ICD-10-CM

## 2011-12-09 DIAGNOSIS — Z1231 Encounter for screening mammogram for malignant neoplasm of breast: Secondary | ICD-10-CM

## 2011-12-09 DIAGNOSIS — D510 Vitamin B12 deficiency anemia due to intrinsic factor deficiency: Secondary | ICD-10-CM

## 2011-12-09 DIAGNOSIS — Z9181 History of falling: Secondary | ICD-10-CM

## 2011-12-09 DIAGNOSIS — Z1331 Encounter for screening for depression: Secondary | ICD-10-CM

## 2011-12-09 DIAGNOSIS — E871 Hypo-osmolality and hyponatremia: Secondary | ICD-10-CM | POA: Diagnosis not present

## 2011-12-09 NOTE — Patient Instructions (Addendum)
Can try valerian root tea for sleep as well. We will call you with your lab results. If you don't here from [place] in about a week then please give [place] a call at [phone].

## 2011-12-09 NOTE — Progress Notes (Addendum)
  Subjective:    Patient ID: Kathleen Dunn, female    DOB: 10/08/1940, 71 y.o.   MRN: [id number]  HPI Low sodium - When we checked her labs it was low so we dec her fluid pill to once a day.  She says she was constantly thristy on bid dosing. .  She says this has gotten so much better since dec her fluid pill to one a day. She would like to try another fluid pill.  Says when does forget to take she really doesn't notice much difference  Insomnia- Says the hydroxazine makes her mentally sluggish during the day.  She stopped it.  Says sometime when takes 1/2 clonazepam and her progesterone that will help but not consistantly.  Benadryl keeps her awake.  Says melatonin makes her too drowsy.   HTN- Says her BP shoots up about 2-3 times a week.  Only uses her metoprolol occ for rapid heart rate, but does not take it daily.. She thought I still want her to use her lisinopril and her blood pressure shoots up. I reminded her that we are to discuss this at her last office visit and I told her to quit taking the lisinopril at all. She says when she takes it she feels very weak and washed out and then she can barely lift her arms.   Review of Systems     Objective:   Physical Exam  Constitutional: She is oriented to person, place, and time. She appears well-developed and well-nourished.  HENT:  Head: Normocephalic and atraumatic.  Cardiovascular: Normal rate, regular rhythm and normal heart sounds.   Pulmonary/Chest: Effort normal and breath sounds normal.  Neurological: She is alert and oriented to person, place, and time.  Skin: Skin is warm and dry.  Psychiatric: She has a normal mood and affect. Her behavior is normal.          Assessment & Plan:  Hyponatremia - doing well on once fluids pill a day vs twice a day  Due to recheck BMP.  She really wants to change her fluid pill because she feels it's not working. This really doesn't make sense to me since we decreased the dose she has  decreased thirst et Karie Soda. Certainly we could consider changing her to chlorthalidone I really have no good reason. She probably has a little bit more ankle swelling because we have decreased her dose but clearly this is functionally much better for her body chemistry.  HTN -BP looks good today, even with the reduction of her fluid pill..  Can use the metoprolol when BP shoots up tose 2-3 times a week. I still think these episodes are stress related.   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  Try the valerian root tea. Also ask about low dose melatonin since that worked well for her in the past. Removed Vistaril from her med list.  She has not completed stool cards yet, for colon cancer screening, as she did have some questions about that. I answered her questions today.  Depression screening-PHQ 9 score of 3. Negative for depression.  Followup assessment-score of 2, low risk for falls.

## 2011-12-10 ENCOUNTER — Other Ambulatory Visit: Payer: Self-pay | Admitting: Family Medicine

## 2011-12-10 DIAGNOSIS — M545 Low back pain: Secondary | ICD-10-CM | POA: Diagnosis not present

## 2011-12-10 LAB — BASIC METABOLIC PANEL
CO2: 29 mEq/L (ref 19–32)
Chloride: 93 mEq/L — ABNORMAL LOW (ref 96–112)
Glucose, Bld: 86 mg/dL (ref 70–99)
Potassium: 3.7 mEq/L (ref 3.5–5.3)
Sodium: 133 mEq/L — ABNORMAL LOW (ref 135–145)

## 2011-12-10 MED ORDER — CHLORTHALIDONE 50 MG PO TABS: 50.0000 mg | ORAL_TABLET | Freq: Every day | ORAL | Status: DC

## 2011-12-12 DIAGNOSIS — M545 Low back pain: Secondary | ICD-10-CM | POA: Diagnosis not present

## 2012-01-02 ENCOUNTER — Other Ambulatory Visit: Payer: Self-pay | Admitting: Family Medicine

## 2012-01-03 ENCOUNTER — Other Ambulatory Visit: Payer: Self-pay | Admitting: *Deleted

## 2012-01-03 MED ORDER — CLONAZEPAM 1 MG PO TABS: 1.0000 mg | ORAL_TABLET | Freq: Two times a day (BID) | ORAL | Status: DC | PRN

## 2012-01-06 ENCOUNTER — Telehealth: Payer: Self-pay | Admitting: *Deleted

## 2012-01-06 NOTE — Telephone Encounter (Signed)
Pt calls and states she has recureent UTI's and her MD in [place] always gave her nitrofuraton 100mg  1 capsule twice a day to have on hand. Pt staes can not come in too sick and wants to know if you would give her a few until she felt better then would come in and discuss with you. Havuing urinary frequency and urgency, oliguria, dysuria. I informed pt that you usually would not do this without checking urine but pt ask me to ask you anyway.  Pt states also that she was low on chromium and she wants to know what to do about it. SUpposed to come in she thinks for another bloodtest and states she can have this rechecked then if needs to be.

## 2012-01-06 NOTE — Telephone Encounter (Signed)
She really needs to come in, Even if just for nurse visit for the UTI sxs. Than can schedule appt later.  Can take a chromium supplement OTC if needed.

## 2012-01-07 NOTE — Telephone Encounter (Signed)
Lm for pt with instructions and to call back.

## 2012-01-16 ENCOUNTER — Other Ambulatory Visit: Payer: Self-pay | Admitting: *Deleted

## 2012-01-16 MED ORDER — METOPROLOL TARTRATE 25 MG PO TABS: 25.0000 mg | ORAL_TABLET | Freq: Every day | ORAL | Status: DC

## 2012-01-16 MED ORDER — CHLORTHALIDONE 50 MG PO TABS: 50.0000 mg | ORAL_TABLET | Freq: Every day | ORAL | Status: DC

## 2012-01-23 ENCOUNTER — Other Ambulatory Visit: Payer: Self-pay | Admitting: *Deleted

## 2012-01-23 ENCOUNTER — Telehealth: Payer: Self-pay | Admitting: *Deleted

## 2012-01-23 MED ORDER — METOPROLOL TARTRATE 25 MG PO TABS: 25.0000 mg | ORAL_TABLET | Freq: Every day | ORAL | Status: DC

## 2012-01-23 MED ORDER — BECLOMETHASONE DIPROPIONATE 40 MCG/ACT IN AERS: 2.0000 | INHALATION_SPRAY | Freq: Two times a day (BID) | RESPIRATORY_TRACT | Status: DC

## 2012-01-23 NOTE — Telephone Encounter (Signed)
Chlorthalidone does not have potassium in it. She may or may not need extra potassium. Please see what her followup is for blood pressure. It lives in the next month. We will usually check a BMP at that time to see if she is losing potassium and if she needs a supplement. I am unclear about the chromion level. This is not a lab that we actually checked. So not sure who detected a low level.

## 2012-01-23 NOTE — Telephone Encounter (Signed)
Pt calls and wants to know if the chlorthalidone has potassium in it if not she wonders if she needs to be on one with potassium in it. Also states she was told her chromium level was low and wants to know what to do about this.

## 2012-01-27 NOTE — Telephone Encounter (Signed)
Pt notified via VM. KJ LPN 

## 2012-02-27 ENCOUNTER — Telehealth: Payer: Self-pay | Admitting: *Deleted

## 2012-02-27 DIAGNOSIS — E7800 Pure hypercholesterolemia, unspecified: Secondary | ICD-10-CM

## 2012-02-27 DIAGNOSIS — I10 Essential (primary) hypertension: Secondary | ICD-10-CM

## 2012-02-27 NOTE — Telephone Encounter (Signed)
Yes please add CBC as well.

## 2012-02-27 NOTE — Telephone Encounter (Signed)
pt has called stating she needs to have labwork done that you wanted a while back. She is saying a lipid panel and cmp. Would you like for me to also add a cbc? Please advise.

## 2012-02-28 ENCOUNTER — Ambulatory Visit: Payer: Medicare Other | Admitting: Family Medicine

## 2012-02-28 ENCOUNTER — Other Ambulatory Visit: Payer: Self-pay | Admitting: *Deleted

## 2012-02-28 MED ORDER — RANITIDINE HCL 150 MG PO CAPS: 150.0000 mg | ORAL_CAPSULE | Freq: Two times a day (BID) | ORAL | Status: DC | PRN

## 2012-02-28 MED ORDER — MOMETASONE FUROATE 50 MCG/ACT NA SUSP: 2.0000 | Freq: Every day | NASAL | Status: DC | PRN

## 2012-02-28 MED ORDER — ESOMEPRAZOLE MAGNESIUM 40 MG PO CPDR: 40.0000 mg | DELAYED_RELEASE_CAPSULE | Freq: Every day | ORAL | Status: DC

## 2012-03-05 ENCOUNTER — Other Ambulatory Visit: Payer: Self-pay | Admitting: *Deleted

## 2012-03-05 MED ORDER — BUPROPION HCL ER (SR) 150 MG PO TB12: 150.0000 mg | ORAL_TABLET | Freq: Two times a day (BID) | ORAL | Status: DC

## 2012-03-10 ENCOUNTER — Other Ambulatory Visit: Payer: Self-pay | Admitting: *Deleted

## 2012-03-10 MED ORDER — TRAMADOL HCL 50 MG PO TABS: 50.0000 mg | ORAL_TABLET | Freq: Four times a day (QID) | ORAL | Status: DC | PRN

## 2012-03-13 ENCOUNTER — Other Ambulatory Visit: Payer: Self-pay | Admitting: Family Medicine

## 2012-03-13 ENCOUNTER — Other Ambulatory Visit: Payer: Self-pay | Admitting: *Deleted

## 2012-03-13 MED ORDER — CLONAZEPAM 1 MG PO TABS: 1.0000 mg | ORAL_TABLET | Freq: Two times a day (BID) | ORAL | Status: DC | PRN

## 2012-03-13 MED ORDER — TRAMADOL HCL 50 MG PO TABS: 50.0000 mg | ORAL_TABLET | Freq: Four times a day (QID) | ORAL | Status: DC | PRN

## 2012-03-13 NOTE — Progress Notes (Signed)
Clonazepam was called in.

## 2012-04-07 ENCOUNTER — Ambulatory Visit: Payer: Medicare Other | Admitting: Family Medicine

## 2012-04-09 ENCOUNTER — Ambulatory Visit: Payer: Medicare Other | Admitting: Family Medicine

## 2012-05-24 ENCOUNTER — Other Ambulatory Visit: Payer: Self-pay | Admitting: Family Medicine

## 2012-06-10 ENCOUNTER — Other Ambulatory Visit: Payer: Self-pay | Admitting: Family Medicine

## 2012-06-10 ENCOUNTER — Telehealth: Payer: Self-pay | Admitting: *Deleted

## 2012-06-10 NOTE — Telephone Encounter (Signed)
Just ask her to do her best. Lab opens 8AM

## 2012-06-10 NOTE — Telephone Encounter (Signed)
Pt.notified

## 2012-06-10 NOTE — Telephone Encounter (Signed)
I will be happy to refill it when she goes for her mammogram. It is well overdue and estrogen does increase risk of breast cancer.

## 2012-06-10 NOTE — Telephone Encounter (Signed)
Patient call back and states that if she needs labs she not sure when she can do it because she has difficulty fasting for lab work. Due to fibromyalgia pain and she has to eat with pain medications.

## 2012-06-10 NOTE — Telephone Encounter (Signed)
Patient asking for Depo Estradiol injection, you have not refilled this medication in a while

## 2012-06-26 ENCOUNTER — Telehealth: Payer: Self-pay | Admitting: *Deleted

## 2012-06-26 NOTE — Telephone Encounter (Signed)
Pt been taking the chlorthalidone but would like to go back on the triamterine that you had her on.[facility] supply [facility]  [address]. Pt did want you to know that she isn't taking the Lipitor but taking baby ASA and Niacin

## 2012-06-26 NOTE — Telephone Encounter (Signed)
Due for 6 month BP f/u anyway. Does she have enough until she comes in and then we can switch it back or if needs rx now I can change it.

## 2012-06-28 ENCOUNTER — Other Ambulatory Visit: Payer: Self-pay | Admitting: Family Medicine

## 2012-07-03 ENCOUNTER — Telehealth: Payer: Self-pay | Admitting: *Deleted

## 2012-07-03 DIAGNOSIS — R899 Unspecified abnormal finding in specimens from other organs, systems and tissues: Secondary | ICD-10-CM

## 2012-07-03 DIAGNOSIS — R7989 Other specified abnormal findings of blood chemistry: Secondary | ICD-10-CM | POA: Diagnosis not present

## 2012-07-03 DIAGNOSIS — D518 Other vitamin B12 deficiency anemias: Secondary | ICD-10-CM | POA: Diagnosis not present

## 2012-07-03 DIAGNOSIS — R5383 Other fatigue: Secondary | ICD-10-CM | POA: Diagnosis not present

## 2012-07-03 DIAGNOSIS — E349 Endocrine disorder, unspecified: Secondary | ICD-10-CM | POA: Diagnosis not present

## 2012-07-03 DIAGNOSIS — E039 Hypothyroidism, unspecified: Secondary | ICD-10-CM | POA: Diagnosis not present

## 2012-07-03 DIAGNOSIS — R6889 Other general symptoms and signs: Secondary | ICD-10-CM | POA: Diagnosis not present

## 2012-07-03 DIAGNOSIS — E559 Vitamin D deficiency, unspecified: Secondary | ICD-10-CM | POA: Diagnosis not present

## 2012-07-03 DIAGNOSIS — E569 Vitamin deficiency, unspecified: Secondary | ICD-10-CM | POA: Diagnosis not present

## 2012-07-03 LAB — BASIC METABOLIC PANEL: Creatinine: 0.7 mg/dL (ref 0.5–1.1)

## 2012-07-03 LAB — LIPID PANEL
Cholesterol: 252 mg/dL — AB (ref 0–200)
HDL: 47 mg/dL (ref 35–70)

## 2012-07-07 ENCOUNTER — Encounter: Payer: Self-pay | Admitting: Family Medicine

## 2012-07-07 ENCOUNTER — Other Ambulatory Visit (HOSPITAL_COMMUNITY)
Admission: RE | Admit: 2012-07-07 | Discharge: 2012-07-07 | Disposition: A | Discharge status: Home / Self Care | Payer: Medicare Other | Source: Ambulatory Visit | Attending: Family Medicine | Admitting: Family Medicine

## 2012-07-07 ENCOUNTER — Ambulatory Visit (INDEPENDENT_AMBULATORY_CARE_PROVIDER_SITE_OTHER): Admitting: Family Medicine

## 2012-07-07 ENCOUNTER — Other Ambulatory Visit: Payer: Self-pay | Admitting: Family Medicine

## 2012-07-07 VITALS — BP 141/81 | HR 82 | Ht 62.0 in | Wt 176.0 lb

## 2012-07-07 DIAGNOSIS — Z0000 Encounter for general adult medical examination without abnormal findings: Secondary | ICD-10-CM

## 2012-07-07 DIAGNOSIS — Z01419 Encounter for gynecological examination (general) (routine) without abnormal findings: Secondary | ICD-10-CM | POA: Diagnosis not present

## 2012-07-07 DIAGNOSIS — Z1151 Encounter for screening for human papillomavirus (HPV): Secondary | ICD-10-CM | POA: Insufficient documentation

## 2012-07-07 DIAGNOSIS — R319 Hematuria, unspecified: Secondary | ICD-10-CM | POA: Diagnosis not present

## 2012-07-07 DIAGNOSIS — N760 Acute vaginitis: Secondary | ICD-10-CM

## 2012-07-07 DIAGNOSIS — R5383 Other fatigue: Secondary | ICD-10-CM

## 2012-07-07 DIAGNOSIS — H60509 Unspecified acute noninfective otitis externa, unspecified ear: Secondary | ICD-10-CM

## 2012-07-07 DIAGNOSIS — Z9229 Personal history of other drug therapy: Secondary | ICD-10-CM

## 2012-07-07 DIAGNOSIS — E785 Hyperlipidemia, unspecified: Secondary | ICD-10-CM

## 2012-07-07 DIAGNOSIS — B360 Pityriasis versicolor: Secondary | ICD-10-CM

## 2012-07-07 DIAGNOSIS — I10 Essential (primary) hypertension: Secondary | ICD-10-CM | POA: Diagnosis not present

## 2012-07-07 DIAGNOSIS — F329 Major depressive disorder, single episode, unspecified: Secondary | ICD-10-CM

## 2012-07-07 DIAGNOSIS — R51 Headache: Secondary | ICD-10-CM

## 2012-07-07 DIAGNOSIS — Z1231 Encounter for screening mammogram for malignant neoplasm of breast: Secondary | ICD-10-CM

## 2012-07-07 DIAGNOSIS — H60549 Acute eczematoid otitis externa, unspecified ear: Secondary | ICD-10-CM

## 2012-07-07 LAB — POCT URINALYSIS DIPSTICK
Ketones, UA: NEGATIVE
Protein, UA: NEGATIVE
Spec Grav, UA: 1.01
Urobilinogen, UA: 0.2
pH, UA: 7

## 2012-07-07 MED ORDER — TRAMADOL HCL 50 MG PO TABS: 50.0000 mg | ORAL_TABLET | Freq: Four times a day (QID) | ORAL | Status: DC | PRN

## 2012-07-07 MED ORDER — KETOCONAZOLE 2 % EX CREA: TOPICAL_CREAM | Freq: Two times a day (BID) | CUTANEOUS | Status: DC

## 2012-07-07 MED ORDER — METOPROLOL TARTRATE 25 MG PO TABS: 25.0000 mg | ORAL_TABLET | Freq: Every day | ORAL | Status: DC

## 2012-07-07 MED ORDER — CLONAZEPAM 1 MG PO TABS: 1.0000 mg | ORAL_TABLET | Freq: Two times a day (BID) | ORAL | Status: DC | PRN

## 2012-07-07 MED ORDER — HYDROCORTISONE-ACETIC ACID 1-2 % OT SOLN: 3.0000 [drp] | Freq: Two times a day (BID) | OTIC | Status: DC

## 2012-07-07 MED ORDER — BENZONATATE 100 MG PO CAPS: 100.0000 mg | ORAL_CAPSULE | Freq: Three times a day (TID) | ORAL | Status: DC | PRN

## 2012-07-07 MED ORDER — TRIAMTERENE-HCTZ 37.5-25 MG PO CAPS: 1.0000 | ORAL_CAPSULE | ORAL | Status: DC
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
## 2012-07-08 ENCOUNTER — Encounter: Payer: Self-pay | Admitting: Family Medicine

## 2012-07-10 LAB — WET PREP, GENITAL
WBC, Wet Prep HPF POC: NONE SEEN
Yeast Wet Prep HPF POC: NONE SEEN

## 2012-07-13 ENCOUNTER — Telehealth: Payer: Self-pay | Admitting: *Deleted

## 2012-07-13 ENCOUNTER — Encounter: Payer: Self-pay | Admitting: *Deleted

## 2012-07-13 NOTE — Telephone Encounter (Signed)
Patient notified and had some concerns about having a mole removed- wanted sedation but made aware that we do not sedate for small mole removal.

## 2012-07-13 NOTE — Telephone Encounter (Signed)
Message copied by Florestine Avers on Mon Jul 13, 2012  4:17 PM ------      Message from: Nani Gasser D      Created: Mon Jul 13, 2012  3:37 PM       Call pt: PAP smear is normal. No need ot repeat since had hystrectomy

## 2012-07-14 ENCOUNTER — Telehealth: Payer: Self-pay | Admitting: *Deleted

## 2012-07-14 NOTE — Telephone Encounter (Signed)
Patient called back and requested Z-pack or Clindamycin

## 2012-07-14 NOTE — Telephone Encounter (Signed)
Patient left a voice message and states "you forgot to order her ABT's for her head congestion.

## 2012-07-14 NOTE — Telephone Encounter (Signed)
I want her to try over-the-counter Allegra first. Try at least for 4-5 days and is still not better by Friday then please give the office a call back.

## 2012-07-15 ENCOUNTER — Telehealth: Payer: Self-pay | Admitting: *Deleted

## 2012-07-16 ENCOUNTER — Ambulatory Visit: Payer: Medicare Other

## 2012-07-16 ENCOUNTER — Telehealth: Payer: Self-pay | Admitting: Family Medicine

## 2012-07-16 NOTE — Telephone Encounter (Signed)
Patient left a voicemail requesting that Selena Batten call her back. Thanks

## 2012-07-22 ENCOUNTER — Telehealth: Payer: Self-pay | Admitting: Family Medicine

## 2012-07-22 NOTE — Telephone Encounter (Signed)
Left message to call office back

## 2012-07-22 NOTE — Telephone Encounter (Signed)
Patient was returning call to [facility] but I am sending to you since this is Kim's half day.  Pls call her back today.

## 2012-07-28 ENCOUNTER — Telehealth: Payer: Self-pay | Admitting: *Deleted

## 2012-07-28 ENCOUNTER — Ambulatory Visit: Payer: Medicare Other | Admitting: Family Medicine

## 2012-07-28 MED ORDER — AZITHROMYCIN 250 MG PO TABS: ORAL_TABLET | ORAL | Status: DC

## 2012-07-28 NOTE — Telephone Encounter (Signed)
I don't know of anyone specifically but does aquatic therapy in that area. Certainly if she finds one healthy happy in a referral for her. We can also check with Victorino Dike to see if she knows of anyone in that area.

## 2012-07-28 NOTE — Telephone Encounter (Signed)
Pt notified via V/M

## 2012-07-28 NOTE — Telephone Encounter (Signed)
Pt also calls back again and states she done PT you ordered months ago and the therapist made her do exercises that hurt her knees so she stopped. [place] and wanted to know if there was somewhere you could send her to do aquatic therapy- feels this would be much better for to do

## 2012-07-28 NOTE — Telephone Encounter (Signed)
Pt calls and states that you were gonna give her antibiotics for her ears and states you all had discussed this at her appointment. Said you sent in the drops but not the antibiotic. Ones she can take are Clindamycin HCL 300mg  one tab 4 times a day, also a Zpak can take but doesn't think its strong enough. Ear bothering her last night and today

## 2012-07-28 NOTE — Telephone Encounter (Signed)
I didn't forget, I didn't think she needed them at the time. If still haven nasal congsetion and ear fullness I will call in zpack. If ears not better in one week then needs appt.

## 2012-07-29 NOTE — Telephone Encounter (Signed)
Error see other message

## 2012-07-30 ENCOUNTER — Telehealth: Payer: Self-pay

## 2012-07-30 NOTE — Telephone Encounter (Signed)
Kathleen Dunn states she still has ear pain. She thinks the Z - Pack is not working and wants Clindamycin. I advised her if her ear is worse then it would be more appropriate to come in for an appointment. She states she has an appointment tomorrow.

## 2012-07-30 NOTE — Telephone Encounter (Signed)
See other message

## 2012-07-31 ENCOUNTER — Telehealth: Payer: Self-pay

## 2012-07-31 ENCOUNTER — Ambulatory Visit: Payer: Medicare Other | Admitting: Family Medicine

## 2012-07-31 NOTE — Telephone Encounter (Signed)
Vesna called and left a message for a return call.  I called and left message for patient to call back.

## 2012-08-26 ENCOUNTER — Telehealth: Payer: Self-pay | Admitting: *Deleted

## 2012-08-26 DIAGNOSIS — M545 Low back pain: Secondary | ICD-10-CM

## 2012-08-26 NOTE — Telephone Encounter (Signed)
Pt.notified

## 2012-08-26 NOTE — Telephone Encounter (Signed)
Pt wants a referral to [facility] [phone]) for PT specifically ultrasound and heat for her back pain as well as aquatic therapy. Discussed with [place] in past and was told if finds a place she would refer her

## 2012-08-26 NOTE — Telephone Encounter (Signed)
Order sent. I am not sure about what they will do aka heat/ultrasound. I will let them make judgement and order anything they access a need for.

## 2012-08-27 ENCOUNTER — Telehealth: Payer: Self-pay | Admitting: *Deleted

## 2012-08-31 DIAGNOSIS — Z23 Encounter for immunization: Secondary | ICD-10-CM | POA: Diagnosis not present

## 2012-08-31 DIAGNOSIS — S41109A Unspecified open wound of unspecified upper arm, initial encounter: Secondary | ICD-10-CM | POA: Diagnosis not present

## 2012-08-31 DIAGNOSIS — Z283 Underimmunization status: Secondary | ICD-10-CM | POA: Diagnosis not present

## 2012-08-31 NOTE — Telephone Encounter (Signed)
See other note

## 2012-09-03 DIAGNOSIS — M545 Low back pain: Secondary | ICD-10-CM | POA: Diagnosis not present

## 2012-09-04 ENCOUNTER — Other Ambulatory Visit: Payer: Self-pay | Admitting: Family Medicine

## 2012-09-04 DIAGNOSIS — M25569 Pain in unspecified knee: Secondary | ICD-10-CM

## 2012-09-04 NOTE — Telephone Encounter (Signed)
Referral placed.

## 2012-09-04 NOTE — Telephone Encounter (Signed)
Pt calls and would like a referral for her knees as well sent to [facility] in [place]. 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

## 2012-09-07 ENCOUNTER — Other Ambulatory Visit: Payer: Self-pay | Admitting: Family Medicine

## 2012-10-21 ENCOUNTER — Telehealth: Payer: Self-pay

## 2012-10-21 NOTE — Telephone Encounter (Signed)
Kathleen Dunn states her prescription for her tramadol has been wrong for a year. She states it should read 1-2 tablets every six hours as needed # 120. Please send to [facility].

## 2012-10-21 NOTE — Telephone Encounter (Signed)
The maximum that I prescribe is 3 in one day because of increased seizure risk on high doses.

## 2012-10-21 NOTE — Telephone Encounter (Signed)
She is ok with 60 in a 30 day period.

## 2012-10-27 ENCOUNTER — Other Ambulatory Visit: Payer: Self-pay | Admitting: Family Medicine

## 2012-10-28 ENCOUNTER — Telehealth: Payer: Self-pay | Admitting: *Deleted

## 2012-10-28 ENCOUNTER — Other Ambulatory Visit: Payer: Self-pay | Admitting: *Deleted

## 2012-10-28 MED ORDER — TRAMADOL HCL 50 MG PO TABS
50.0000 mg | ORAL_TABLET | Freq: Four times a day (QID) | ORAL | Status: DC | PRN
Start: 2012-10-28 — End: 2013-02-23

## 2012-11-02 ENCOUNTER — Other Ambulatory Visit: Payer: Self-pay | Admitting: Family Medicine

## 2012-11-03 ENCOUNTER — Other Ambulatory Visit: Payer: Self-pay | Admitting: Family Medicine

## 2012-11-06 ENCOUNTER — Ambulatory Visit: Payer: Medicare Other | Admitting: Sports Medicine

## 2012-11-16 ENCOUNTER — Other Ambulatory Visit: Payer: Self-pay | Admitting: Family Medicine

## 2012-12-08 ENCOUNTER — Telehealth: Payer: Self-pay | Admitting: *Deleted

## 2012-12-08 NOTE — Telephone Encounter (Signed)
Patient called advised that she has seen Dr. Linford Arnold about her bp issues and would like to speak to Dr. Judie Petit or nurse to see if she can change meds or try something different. [phone]. Thank you

## 2013-01-05 ENCOUNTER — Ambulatory Visit (INDEPENDENT_AMBULATORY_CARE_PROVIDER_SITE_OTHER): Payer: Medicare Other | Admitting: Family Medicine

## 2013-01-05 ENCOUNTER — Encounter: Payer: Self-pay | Admitting: Family Medicine

## 2013-01-05 VITALS — BP 134/66 | HR 68 | Wt 180.0 lb

## 2013-01-05 DIAGNOSIS — D235 Other benign neoplasm of skin of trunk: Secondary | ICD-10-CM | POA: Diagnosis not present

## 2013-01-05 DIAGNOSIS — D239 Other benign neoplasm of skin, unspecified: Secondary | ICD-10-CM | POA: Diagnosis not present

## 2013-01-05 DIAGNOSIS — L820 Inflamed seborrheic keratosis: Secondary | ICD-10-CM | POA: Diagnosis not present

## 2013-01-05 DIAGNOSIS — D229 Melanocytic nevi, unspecified: Secondary | ICD-10-CM

## 2013-01-05 NOTE — Patient Instructions (Signed)
He can remove the Band-Aid tomorrow. Can wash area with antibacterial soap in the shower. Do not use any alcohol or peroxide on the area. Apply a little bit of Vaseline and cover with a Band-Aid. He can do this once a day. After about 3 days she can stop using the Band-Aid and just apply Vaseline over the lesion for 10-14 days. Call if any sign of infection. We will call you with the biopsy report is available.

## 2013-01-05 NOTE — Progress Notes (Signed)
  Subjective:    Patient ID: Kathleen Dunn, female    DOB: July 19, 1941, 72 y.o.   MRN: [id number]  HPI She's here for mole removal on her left side. She says it has been getting larger. She noticed it turned black and then syncope loss. If she noticed a line in it. She says it will sometimes swell and then go down a little bit. She's never had prior history of skin cancer. Her daughter is with her here today. She's very nervous about the removal. She reports no problems with iodine or was prior anesthesia with lidocaine.   Review of Systems     Objective:   Physical Exam She does have an approximately 1 x 0.5 cm oval tannish brown raised lesion on her left side.       Assessment & Plan:  Atypical nevus-sent for pathology for further evaluation. It may just be a seborrheic keratosis but because of her complaints of change I recommended that we do a shave biopsy. Please see procedure note below. When care instructions given.   Shave Biopsy Procedure Note  Pre-operative Diagnosis: Dysplastic nevi  Post-operative Diagnosis: same  Locations:left flank   Indications: changing color and size  Anesthesia: Lidocaine 1% without epinephrine    Procedure Details  History of allergy to iodine: no  Patient informed of the risks (including bleeding and infection) and benefits of the  procedure and Verbal informed consent obtained.  The lesion and surrounding area were given a sterile prep using betadyne and draped in the usual sterile fashion. A scalpel was used to shave an area of skin approximately 1cm by 1.5cm.  Hemostasis achieved with alumuninum chloride. Antibiotic ointment and a sterile dressing applied.  The specimen was sent for pathologic examination. The patient tolerated the procedure well.  EBL: 0.1 ml  Findings: none  Condition: Stable  Complications: none.  Plan: 1. Instructed to keep the wound dry and covered for 24-48h and clean thereafter. 2. Warning signs  of infection were reviewed.   3. Recommended that the patient use OTC acetaminophen as needed for pain.  4. Return in prn 5. F/U wound care instructions given.

## 2013-01-19 ENCOUNTER — Telehealth: Payer: Self-pay | Admitting: *Deleted

## 2013-01-19 NOTE — Telephone Encounter (Signed)
Pt stated that she accidentally poured rubbing alcohol on her vienna sausage

## 2013-02-08 ENCOUNTER — Emergency Department (HOSPITAL_BASED_OUTPATIENT_CLINIC_OR_DEPARTMENT_OTHER)
Admission: EM | Admit: 2013-02-08 | Discharge: 2013-02-08 | Disposition: A | Discharge status: Home / Self Care | Payer: Medicare Other | Attending: Emergency Medicine | Admitting: Emergency Medicine

## 2013-02-08 ENCOUNTER — Encounter (HOSPITAL_BASED_OUTPATIENT_CLINIC_OR_DEPARTMENT_OTHER): Payer: Self-pay | Admitting: *Deleted

## 2013-02-08 DIAGNOSIS — I10 Essential (primary) hypertension: Secondary | ICD-10-CM | POA: Insufficient documentation

## 2013-02-08 DIAGNOSIS — E079 Disorder of thyroid, unspecified: Secondary | ICD-10-CM | POA: Insufficient documentation

## 2013-02-08 DIAGNOSIS — L738 Other specified follicular disorders: Secondary | ICD-10-CM | POA: Diagnosis not present

## 2013-02-08 DIAGNOSIS — Z87891 Personal history of nicotine dependence: Secondary | ICD-10-CM | POA: Diagnosis not present

## 2013-02-08 DIAGNOSIS — R000 Tachycardia, unspecified: Secondary | ICD-10-CM | POA: Insufficient documentation

## 2013-02-08 DIAGNOSIS — Z79899 Other long term (current) drug therapy: Secondary | ICD-10-CM | POA: Insufficient documentation

## 2013-02-08 DIAGNOSIS — IMO0002 Reserved for concepts with insufficient information to code with codable children: Secondary | ICD-10-CM | POA: Diagnosis not present

## 2013-02-08 DIAGNOSIS — L739 Follicular disorder, unspecified: Secondary | ICD-10-CM

## 2013-02-08 DIAGNOSIS — J45909 Unspecified asthma, uncomplicated: Secondary | ICD-10-CM | POA: Diagnosis not present

## 2013-02-08 HISTORY — DX: Disorder of thyroid, unspecified: E07.9

## 2013-02-08 HISTORY — DX: Unspecified asthma, uncomplicated: J45.909

## 2013-02-08 HISTORY — DX: Essential (primary) hypertension: I10

## 2013-02-08 MED ORDER — TRIAMCINOLONE ACETONIDE 0.1 % EX CREA: TOPICAL_CREAM | Freq: Two times a day (BID) | CUTANEOUS | Status: DC

## 2013-02-08 MED ORDER — DOXYCYCLINE HYCLATE 100 MG PO CAPS: 100.0000 mg | ORAL_CAPSULE | Freq: Two times a day (BID) | ORAL | Status: DC

## 2013-02-08 NOTE — ED Notes (Signed)
MD at bedside. 

## 2013-02-08 NOTE — ED Provider Notes (Signed)
Medical screening examination/treatment/procedure(s) were conducted as a shared visit with non-physician practitioner(s) and myself.  I personally evaluated the patient during the encounter  Pt with non-specific rash on LE likely mild folliculitis. Also has contact dermatitis over her L lower back.   Cace Osorto B. Bernette Mayers, MD 02/08/13 [phone]

## 2013-02-08 NOTE — ED Notes (Signed)
Rash on her legs x 4 days. Started after having mole removed 5 weeks ago. Worse at night.

## 2013-02-08 NOTE — ED Provider Notes (Signed)
History     CSN: [id number]  Arrival date & time 02/08/13  2028   First MD Initiated Contact with Patient 02/08/13 2036      Chief Complaint  Patient presents with  . Rash    (Consider location/radiation/quality/duration/timing/severity/associated sxs/prior treatment) Patient is a 72 y.o. female presenting with rash. The history is provided by the patient. No language interpreter was used.  Rash Pain location: legs. Pain severity:  No pain Timing:  Constant Relieved by:  Nothing Pt complains of a rash on her legs.  Pt also has a persis ant rash on her back  Pt reports her heart rate was fast at home.  Pt reports she took her lopressor and now it seems normal Past Medical History  Diagnosis Date  . Asthma   . Hypertension   . Thyroid disease     Past Surgical History  Procedure Laterality Date  . Breast surgery      benign bx of left breast  . Abdominal hysterectomy  age 28    oophorectomy    Family History  Problem Relation Age of Onset  . Heart attack Father     History  Substance Use Topics  . Smoking status: Former Smoker    Quit date: 09/17/1971  . Smokeless tobacco: Never Used  . Alcohol Use: No    OB History   Grav Para Term Preterm Abortions TAB SAB Ect Mult Living                  Review of Systems  Skin: Positive for rash.  All other systems reviewed and are negative.    Allergies  Codeine; Erythromycin; Morphine and related; Penicillamine; Sulfonamide derivatives; and Vistaril  Home Medications   Current Outpatient Rx  Name  Route  Sig  Dispense  Refill  . acetic acid-hydrocortisone (VOSOL-HC) otic solution      PLACE 3 DROPS INTO BOTH EARS 2 (TWO) TIMES DAILY.   10 mL   0   . albuterol (PROVENTIL HFA;VENTOLIN HFA) 108 (90 BASE) MCG/ACT inhaler   Inhalation   Inhale 2 puffs into the lungs every 6 (six) hours as needed.           Marland Kitchen amitriptyline (ELAVIL) 10 MG tablet   Oral   Take 10-40 mg by mouth daily as needed. For  Headache         . beclomethasone (QVAR) 40 MCG/ACT inhaler   Inhalation   Inhale 2 puffs into the lungs 2 (two) times daily.   3 Inhaler   1   . buPROPion (WELLBUTRIN SR) 150 MG 12 hr tablet   Oral   Take 150 mg by mouth daily as needed.         . Cholecalciferol (VITAMIN D3) 1000 UNITS CAPS   Oral   Take 1 capsule by mouth daily.           . clonazePAM (KLONOPIN) 1 MG tablet   Oral   Take 1 tablet (1 mg total) by mouth 2 (two) times daily as needed.   60 tablet   4   . cyanocobalamin (,VITAMIN B-12,) 1000 MCG/ML injection      Inject 1/2 cc intramuscularly twice weekly          . DEPO-ESTRADIOL 5 MG/ML injection      USE AS DIRECTED   5 mL   2   . doxycycline (VIBRAMYCIN) 100 MG capsule   Oral   Take 1 capsule (100 mg total) by mouth 2 (two)  times daily.   20 capsule   0   . ketoconazole (NIZORAL) 2 % cream   Topical   Apply topically 2 (two) times daily. Apply the circular lesion on torso X 2 weeks.   60 g   0   . levothyroxine (SYNTHROID, LEVOTHROID) 50 MCG tablet      TAKE 1 TABLET (50 MCG TOTAL) BY MOUTH DAILY   30 tablet   2   . metoprolol tartrate (LOPRESSOR) 25 MG tablet      TAKE 1 TABLET DAILY   30 tablet   0     Patient will need to make an appointment for furth ...   . NASONEX 50 MCG/ACT nasal spray      USE 2 SPRAYS NASALLY DAILY AS NEEDED   51 g   3   . NEXIUM 40 MG capsule      TAKE 1 CAPSULE BY MOUTH DAILY BEFORE BREAKFAST   90 capsule   0   . Omega-3 Fatty Acids (FISH OIL) 1000 MG CAPS   Oral   Take 1,000 mg by mouth 2 (two) times daily.           . progesterone (PROMETRIUM) 100 MG capsule   Oral   Take 1 capsule (100 mg total) by mouth daily. Take 1-2 capsules by mouth at bedtimeTake 1-2 capsules by mouth at bedtime   180 capsule   1   . ranitidine (ZANTAC) 150 MG capsule   Oral   Take 1 capsule (150 mg total) by mouth 2 (two) times daily as needed.   180 capsule   1   . Testosterone (TESTIM TD)    Transdermal   Place onto the skin. Apply 1/4 cc daily          . traMADol (ULTRAM) 50 MG tablet   Oral   Take 1 tablet (50 mg total) by mouth every 6 (six) hours as needed.   90 tablet   1   . triamcinolone cream (KENALOG) 0.1 %   Topical   Apply topically 2 (two) times daily.   45 g   0   . triamterene-hydrochlorothiazide (DYAZIDE) 37.5-25 MG per capsule   Oral   Take 1 each (1 capsule total) by mouth every morning.   90 capsule   1     BP 136/62  Pulse 73  Temp(Src) 98.8 F (37.1 C) (Oral)  Resp 18  Wt 174 lb (78.926 kg)  BMI 31.82 kg/m2  SpO2 95%  Physical Exam  Constitutional: She is oriented to person, place, and time. She appears well-developed and well-nourished.  HENT:  Head: Normocephalic and atraumatic.  Eyes: Pupils are equal, round, and reactive to light.  Neck: Normal range of motion.  Cardiovascular: Normal rate and normal heart sounds.   Pulmonary/Chest: Effort normal and breath sounds normal.  Musculoskeletal: Normal range of motion.  Neurological: She is alert and oriented to person, place, and time. She has normal reflexes.  Skin: Rash noted.  Small pimples lower legs, raised, erythematous,   Left back  bandaid size mark,  Erythema,  Wound in center healing  Psychiatric: She has a normal mood and affect.    ED Course  Procedures (including critical care time)  Labs Reviewed - No data to display No results found.   1. Folliculitis       MDM   Date: 02/08/2013  Rate: 61  Rhythm: normal sinus rhythm  QRS Axis: normal  Intervals: normal  ST/T Wave abnormalities: normal  Conduction  Disutrbances:none  Narrative Interpretation:   Old EKG Reviewed: none available  Dr. Bernette Mayers in to see      Elson Areas, PA-C 02/08/13 2322

## 2013-02-09 ENCOUNTER — Telehealth: Payer: Self-pay | Admitting: *Deleted

## 2013-02-09 NOTE — Telephone Encounter (Signed)
Patient calls and ask to speak with you. Barry Dienes, LPN

## 2013-02-11 ENCOUNTER — Other Ambulatory Visit: Payer: Self-pay | Admitting: *Deleted

## 2013-02-11 ENCOUNTER — Ambulatory Visit: Payer: Medicare Other | Admitting: Family Medicine

## 2013-02-11 MED ORDER — TRIAMCINOLONE ACETONIDE 0.1 % EX CREA: TOPICAL_CREAM | Freq: Two times a day (BID) | CUTANEOUS | Status: DC

## 2013-02-12 ENCOUNTER — Ambulatory Visit (INDEPENDENT_AMBULATORY_CARE_PROVIDER_SITE_OTHER): Payer: Medicare Other | Admitting: Family Medicine

## 2013-02-12 ENCOUNTER — Ambulatory Visit: Payer: Medicare Other | Admitting: Family Medicine

## 2013-02-12 ENCOUNTER — Telehealth: Payer: Self-pay

## 2013-02-12 ENCOUNTER — Encounter: Payer: Self-pay | Admitting: Family Medicine

## 2013-02-12 VITALS — BP 135/78 | HR 64 | Wt 179.0 lb

## 2013-02-12 DIAGNOSIS — B86 Scabies: Secondary | ICD-10-CM

## 2013-02-12 MED ORDER — TRIAMCINOLONE ACETONIDE 0.1 % EX CREA: TOPICAL_CREAM | Freq: Two times a day (BID) | CUTANEOUS | Status: DC

## 2013-02-12 MED ORDER — METHYLPREDNISOLONE ACETATE 80 MG/ML IJ SUSP
80.0000 mg | Freq: Once | INTRAMUSCULAR | Status: AC
  Administered 2013-02-12: 80 mg via INTRAMUSCULAR

## 2013-02-12 MED ORDER — PERMETHRIN 5 % EX CREA: TOPICAL_CREAM | CUTANEOUS | Status: DC

## 2013-02-12 NOTE — Telephone Encounter (Signed)
Contagious typically for 3 days after treatment. Scabies can live on clothing or linens for up to 3 days. All of your recently used clothing, towels, and bed linens should be washed in hot water and then dried in a dryer for at least 20 minutes on high heat. Items that cannot be washed should be enclosed in a plastic bag for at least 3 days.

## 2013-02-12 NOTE — Patient Instructions (Signed)
Scabies  Scabies are small bugs (mites) that burrow under the skin and cause red bumps and severe itching. These bugs can only be seen with a microscope. Scabies are highly contagious. They can spread easily from person to person by direct contact. They are also spread through sharing clothing or linens that have the scabies mites living in them. It is not unusual for an entire family to become infected through shared towels, clothing, or bedding.   HOME CARE INSTRUCTIONS   · Your caregiver may prescribe a cream or lotion to kill the mites. If cream is prescribed, massage the cream into the entire body from the neck to the bottom of both feet. Also massage the cream into the scalp and face if your child is less than 1 year old. Avoid the eyes and mouth. Do not wash your hands after application.  · Leave the cream on for 8 to 12 hours. Your child should bathe or shower after the 8 to 12 hour application period. Sometimes it is helpful to apply the cream to your child right before bedtime.  · One treatment is usually effective and will eliminate approximately 95% of infestations. For severe cases, your caregiver may decide to repeat the treatment in 1 week. Everyone in your household should be treated with one application of the cream.  · New rashes or burrows should not appear within 24 to 48 hours after successful treatment. However, the itching and rash may last for 2 to 4 weeks after successful treatment. Your caregiver may prescribe a medicine to help with the itching or to help the rash go away more quickly.  · Scabies can live on clothing or linens for up to 3 days. All of your child's recently used clothing, towels, stuffed toys, and bed linens should be washed in hot water and then dried in a dryer for at least 20 minutes on high heat. Items that cannot be washed should be enclosed in a plastic bag for at least 3 days.  · To help relieve itching, bathe your child in a cool bath or apply cool washcloths to the  affected areas.  · Your child may return to school after treatment with the prescribed cream.  SEEK MEDICAL CARE IF:   · The itching persists longer than 4 weeks after treatment.  · The rash spreads or becomes infected. Signs of infection include red blisters or yellow-tan crust.  Document Released: 09/02/2005 Document Revised: 11/25/2011 Document Reviewed: 01/11/2009  [employer]® Patient Information ©2014 [employer].

## 2013-02-12 NOTE — Telephone Encounter (Signed)
How long is the scabies contagious?

## 2013-02-12 NOTE — Progress Notes (Signed)
CC: Kathleen Dunn is a 72 y.o. female is here for Rash   Subjective: HPI:  Patient complains of a worsening rash that is localized to the legs forearms buttocks and lower back. This started on the legs on Sunday. Is described as itchy of moderate to severe in severity. Symptoms are present all hours of the day often waking her from sleep. Rashes not changed but itching is somewhat improved with Benadryl. A local emergency room gave her doxycycline for suspected folliculitis and triamcinolone cream which she has been using however she still feels the rash is worsening and can confidently say it is spreading.  She's never had this before. She denies any new personal care products. She lives in an extended stay hotel nobody lives with her.  She denies fevers, chills, nausea, flushing, shortness of breath, wheezing, GI disturbance, bruising   Review Of Systems Outlined In HPI  Past Medical History  Diagnosis Date  . Asthma   . Hypertension   . Thyroid disease      Family History  Problem Relation Age of Onset  . Heart attack Father      History  Substance Use Topics  . Smoking status: Former Smoker    Quit date: 09/17/1971  . Smokeless tobacco: Never Used  . Alcohol Use: No     Objective: Filed Vitals:   02/12/13 1419  BP: 135/78  Pulse: 64    Vital signs reviewed. General: Alert and Oriented, No Acute Distress HEENT: Pupils equal, round, reactive to light. Conjunctivae clear.  External ears unremarkable.  Moist mucous membranes. Lungs: Clear and comfortable work of breathing, speaking in full sentences without accessory muscle use. Cardiac: Regular rate and rhythm.  Extremities: No peripheral edema.  Strong peripheral pulses.  Mental Status: No depression, anxiety, nor agitation. Logical though process. Skin: Warm and dry. Papular erythematous rash with excoriations scattered on the lower legs of moderate severity, mild severity on forearms, mild severity lower back.  She  is scratching frequently during exam   Assessment & Plan: Kathleen Dunn was seen today for rash.  Diagnoses and associated orders for this visit:  Scabies - Discontinue: permethrin (ELIMITE) 5 % cream; Apply cream from head to toe; leave on for 8-14 hours before washing off with water. May repeat in 14 days if not improved. - permethrin (ELIMITE) 5 % cream; Apply cream from head to toe; leave on for 8-14 hours before washing off with water. May repeat in 14 days if not improved.     discussed with patient my suspicion for scabies or bedbugs, she may continue it is not causing side effects to prevent bacterial coinfection.  She will receive 80 mg Depo-Medrol for itching and inflammation went over instructions on how to use permethrin cream appropriately and environmental cleaning.  25 minutes spent face-to-face during visit today of which at least 50% was counseling or coordinating care regarding scabies, etiology and treatment.   Return if symptoms worsen or fail to improve.

## 2013-02-12 NOTE — Telephone Encounter (Signed)
Left detailed message.   

## 2013-02-15 NOTE — Telephone Encounter (Signed)
Spoke with pt .Kathleen Dunn  

## 2013-02-16 ENCOUNTER — Encounter: Payer: Self-pay | Admitting: Family Medicine

## 2013-02-16 ENCOUNTER — Ambulatory Visit (INDEPENDENT_AMBULATORY_CARE_PROVIDER_SITE_OTHER): Payer: Medicare Other | Admitting: Family Medicine

## 2013-02-16 VITALS — BP 147/64 | HR 76 | Wt 177.0 lb

## 2013-02-16 DIAGNOSIS — R21 Rash and other nonspecific skin eruption: Secondary | ICD-10-CM

## 2013-02-16 MED ORDER — METHYLPREDNISOLONE SODIUM SUCC 125 MG IJ SOLR
125.0000 mg | Freq: Once | INTRAMUSCULAR | Status: AC
  Administered 2013-02-16: 125 mg via INTRAMUSCULAR

## 2013-02-16 MED ORDER — METOPROLOL TARTRATE 25 MG PO TABS: ORAL_TABLET | ORAL | Status: DC

## 2013-02-16 NOTE — Progress Notes (Signed)
  Subjective:    Patient ID: Kathleen Dunn, female    DOB: 25-Dec-1940, 72 y.o.   MRN: [id number]  HPI Reviewed note from Dr. Ivan Anchors. Went to ED for rash and was given doxycycline. Says she had little yellow pimples. She ws told to come in and have a cultlure done. She has not pustules currently.   Says the steroid shot gave her a lot of relief. Feels it started helping before she even got home. Did use the premethrin cream that evening. Says her itching is much better overall. Has been suing the topical steroid cream as well. No new past. Lesions. No fever. No shortness of breath.  She really wants another steroid shot today.  She is worried that if she doesn't get another one that the rash will come right back. She denies any new soaps, lotions, perfumes etc.  Review of Systems     Objective:   Physical Exam  Constitutional: She appears well-developed and well-nourished.  HENT:  Head: Normocephalic and atraumatic.  Skin:  She has a few scattered scabs on her lower legs and forearms. Most of them are 1-2 mm.  No active pustules.            Assessment & Plan:  Rash - She is better overall. Itching is 95% better.   Lesions are healing. She really wants another steroid injection today. She's worried about the first of wearing off in the itching coming back. Tried to reassure her that she will likely do well without it. Finally after much discussion I decided to give her Solu-Medrol and 25 mg to help relieve her symptoms completely and to give her reassurance. Says the steroid cream is helping as well.  Encouraged her to scrub her nails daily with and nail brush with anti-bacterial soap.  Call if feels ike it starting again. Avoid scratching as much as possible. Looks like folliculitis today, but id defintely healing well.  I also reassured her that we cannot culture the pustules when she does not have any active pustules.  She also complained of abdominal bloating that has been going on  for a long time. She says after she she consistently. She never did have her screening colonoscopy. I encouraged her to make an appointment in the next week or 2 so we can discuss this further a social sense of a chronic ongoing issue.

## 2013-02-17 ENCOUNTER — Other Ambulatory Visit: Payer: Self-pay | Admitting: Family Medicine

## 2013-02-18 ENCOUNTER — Encounter: Payer: Self-pay | Admitting: Family Medicine

## 2013-02-18 ENCOUNTER — Ambulatory Visit (INDEPENDENT_AMBULATORY_CARE_PROVIDER_SITE_OTHER): Payer: Medicare Other | Admitting: Family Medicine

## 2013-02-18 VITALS — BP 105/65 | HR 70 | Wt 179.0 lb

## 2013-02-18 DIAGNOSIS — Z1159 Encounter for screening for other viral diseases: Secondary | ICD-10-CM | POA: Diagnosis not present

## 2013-02-18 DIAGNOSIS — E039 Hypothyroidism, unspecified: Secondary | ICD-10-CM

## 2013-02-18 DIAGNOSIS — Z113 Encounter for screening for infections with a predominantly sexual mode of transmission: Secondary | ICD-10-CM

## 2013-02-18 DIAGNOSIS — R143 Flatulence: Secondary | ICD-10-CM | POA: Diagnosis not present

## 2013-02-18 DIAGNOSIS — R10816 Epigastric abdominal tenderness: Secondary | ICD-10-CM | POA: Diagnosis not present

## 2013-02-18 DIAGNOSIS — R5382 Chronic fatigue, unspecified: Secondary | ICD-10-CM

## 2013-02-18 DIAGNOSIS — R140 Abdominal distension (gaseous): Secondary | ICD-10-CM

## 2013-02-18 DIAGNOSIS — R5381 Other malaise: Secondary | ICD-10-CM | POA: Diagnosis not present

## 2013-02-18 DIAGNOSIS — R141 Gas pain: Secondary | ICD-10-CM | POA: Diagnosis not present

## 2013-02-18 DIAGNOSIS — R5383 Other fatigue: Secondary | ICD-10-CM | POA: Diagnosis not present

## 2013-02-18 NOTE — Patient Instructions (Signed)
Recommend trial of Align.

## 2013-02-18 NOTE — Progress Notes (Signed)
  Subjective:    Patient ID: Kathleen Dunn, female    DOB: Apr 08, 1941, 72 y.o.   MRN: [id number]  HPI Says can eat a tiny amount and will feel her stomach gets hard and bloated. No pain. No change BMs.  No nausea.  No early satiety. No Dysphagia.  No swollen glands. She says she's planning on going to [place] to visit a friend and doesn't want to be in varus by the bloating. She also says she has frequent gas that the uncontrollable at times. She says it's extremely embarrassing and concerning for her. No diarrhea or constipation.   Review of Systems     Objective:   Physical Exam  Constitutional: She is oriented to person, place, and time. She appears well-developed and well-nourished.  HENT:  Head: Normocephalic and atraumatic.  Cardiovascular: Normal rate, regular rhythm and normal heart sounds.   Pulmonary/Chest: Effort normal and breath sounds normal.  Abdominal: Soft. Bowel sounds are normal. She exhibits no distension and no mass. There is tenderness. There is no rebound and no guarding.  Epigastric tenderness with increased tympany.   Neurological: She is alert and oriented to person, place, and time.  Skin: Skin is warm and dry.  Psychiatric: She has a normal mood and affect. Her behavior is normal.          Assessment & Plan:  Bloating - Recommend probiotic. I think this can help with the bloating and gas that she's experiencing. Can consider testing for milk allergy or gluten. She has declined a colonoscopy. Encouraged her to think about it. Also encouraged her to avoid gas producing foods. I'm hoping that the probiotic as helpful in regulating her digestive track a little bit. Check liver enzymes as well as pancreatic enzymes this. We'll check her thyroid as she is hypothyroid. He believes her last level was about 7 months ago.  STD testing-patient requests STD testing. She was married for over 30 years and her husband cheated on her multiple times. She has not had a  new part her since but would like to be tested for STDs. She denies any vaginal pain discomfort or lesions. She does have the abdominal bloating as above. She has had a hysterectomy. We'll test for HIV, syphilis, herpes simplex, acute hepatitis, and gonorrhea and Chlamydia.  Time spent 25 minutes, greater than 50% time spent counseling her about her abdominal bloating discomfort and STD testing.

## 2013-02-19 ENCOUNTER — Telehealth: Payer: Self-pay | Admitting: *Deleted

## 2013-02-19 LAB — HEPATITIS PANEL, ACUTE
HCV Ab: NEGATIVE
Hep B C IgM: NEGATIVE
Hepatitis B Surface Ag: NEGATIVE

## 2013-02-19 LAB — COMPLETE METABOLIC PANEL WITH GFR
Albumin: 4 g/dL (ref 3.5–5.2)
CO2: 29 mEq/L (ref 19–32)
Chloride: 94 mEq/L — ABNORMAL LOW (ref 96–112)
GFR, Est African American: 84 mL/min
GFR, Est Non African American: 73 mL/min
Glucose, Bld: 86 mg/dL (ref 70–99)
Potassium: 3.9 mEq/L (ref 3.5–5.3)
Sodium: 134 mEq/L — ABNORMAL LOW (ref 135–145)
Total Protein: 7 g/dL (ref 6.0–8.3)

## 2013-02-19 LAB — HIV ANTIBODY (ROUTINE TESTING W REFLEX): HIV: NONREACTIVE

## 2013-02-19 LAB — GLIA (IGA/G) + TTG IGA: Gliadin IgA: 8.3 U/mL (ref <20)

## 2013-02-19 NOTE — Telephone Encounter (Signed)
Pt calls at 2:11pm and left VM to return her call.  Returned pt call back at 3:01pm and had to leave message for pt to return my call. Barry Dienes, LPN

## 2013-02-22 NOTE — Telephone Encounter (Signed)
When pt had blood drawn on Friday got really bruised and she states she felt very uncomfortable with the 2 men that were down there. Pt states she felt like they were gay and they were taking her blood and this set alarms off in her head. Pt states she wants a woman to draw her blood.

## 2013-02-22 NOTE — Telephone Encounter (Signed)
I will take her concerns into consider but I cannot promise there will be a woman there to draw her blood.

## 2013-02-22 NOTE — Telephone Encounter (Signed)
Called pt back again and LM to return our call if needs [place]. Barry Dienes, LPN

## 2013-02-22 NOTE — Telephone Encounter (Signed)
Pt LMOM returning my call at 10:25am. Called pt back and had to leave another message to return my call @ 10:46am. Barry Dienes, LPN

## 2013-02-23 ENCOUNTER — Other Ambulatory Visit: Payer: Self-pay | Admitting: *Deleted

## 2013-02-23 MED ORDER — CLONAZEPAM 1 MG PO TABS: 1.0000 mg | ORAL_TABLET | Freq: Two times a day (BID) | ORAL | Status: DC | PRN

## 2013-02-23 MED ORDER — TRAMADOL HCL 50 MG PO TABS: 50.0000 mg | ORAL_TABLET | Freq: Four times a day (QID) | ORAL | Status: DC | PRN

## 2013-02-23 MED ORDER — TRIAMTERENE-HCTZ 37.5-25 MG PO CAPS: 1.0000 | ORAL_CAPSULE | ORAL | Status: DC

## 2013-02-24 ENCOUNTER — Other Ambulatory Visit: Payer: Self-pay | Admitting: Family Medicine

## 2013-02-24 ENCOUNTER — Ambulatory Visit (INDEPENDENT_AMBULATORY_CARE_PROVIDER_SITE_OTHER): Payer: Medicare Other | Admitting: Physician Assistant

## 2013-02-24 ENCOUNTER — Encounter: Payer: Self-pay | Admitting: Physician Assistant

## 2013-02-24 ENCOUNTER — Telehealth: Payer: Self-pay | Admitting: Physician Assistant

## 2013-02-24 VITALS — BP 112/59 | HR 76 | Wt 179.0 lb

## 2013-02-24 DIAGNOSIS — R300 Dysuria: Secondary | ICD-10-CM | POA: Diagnosis not present

## 2013-02-24 DIAGNOSIS — L293 Anogenital pruritus, unspecified: Secondary | ICD-10-CM | POA: Diagnosis not present

## 2013-02-24 DIAGNOSIS — N898 Other specified noninflammatory disorders of vagina: Secondary | ICD-10-CM

## 2013-02-24 LAB — WET PREP FOR TRICH, YEAST, CLUE
Clue Cells Wet Prep HPF POC: NONE SEEN
Yeast Wet Prep HPF POC: NONE SEEN

## 2013-02-24 LAB — POCT URINALYSIS DIPSTICK
Bilirubin, UA: NEGATIVE
Glucose, UA: NEGATIVE
Ketones, UA: NEGATIVE
Nitrite, UA: NEGATIVE
Protein, UA: NEGATIVE
Spec Grav, UA: 1.015
Urobilinogen, UA: 0.2
pH, UA: 7

## 2013-02-24 MED ORDER — NITROFURANTOIN MONOHYD MACRO 100 MG PO CAPS: 100.0000 mg | ORAL_CAPSULE | Freq: Two times a day (BID) | ORAL | Status: DC

## 2013-02-24 MED ORDER — CIPROFLOXACIN HCL 500 MG PO TABS: 500.0000 mg | ORAL_TABLET | Freq: Two times a day (BID) | ORAL | Status: DC

## 2013-02-24 MED ORDER — CLONAZEPAM 1 MG PO TABS: 1.0000 mg | ORAL_TABLET | Freq: Two times a day (BID) | ORAL | Status: DC | PRN

## 2013-02-24 NOTE — Patient Instructions (Addendum)
Will call with Wet prep results.   Sent klonapin to pharmacy.     Vaginitis Vaginitis in a soreness, swelling and redness (inflammation) of the vagina and vulva. This is not a sexually transmitted infection.  CAUSES  Yeast vaginitis is caused by yeast (candida) that is normally found in your vagina. With a yeast infection, the candida has over grown in number to a point that upsets the chemical balance. SYMPTOMS   White thick vaginal discharge.   Swelling, itching, redness and irritation of the vagina and possibly the lips of the vagina (vulva).   Burning or painful urination.   Painful intercourse.  HOME CARE INSTRUCTIONS   Finish all medication as prescribed.   Do not have sex until treatment is completed or instructed by your healthcare giver.   Take warm sitz baths.   Do not douche.   Do not use tampons, especially scented ones.   Wear cotton underwear.   Avoid tight pants and panty hose.   Tell your sexual partner that you have a yeast infection. They should go to their caregiver if they have symptoms such as mild rash or itching.   Your sexual partner should be treated if your infection is difficult to eliminate.   Practice safer sex. Use condoms.   Some vaginal medications cause latex condoms to fail. Ask your caregiver this.  SEEK MEDICAL CARE IF:   You develop a fever.   The infection is getting worse after 2 days of treatment.   The infection is not getting better after 3 days of treatment.   You develop blisters in or around your vagina.   You develop vaginal bleeding, and it is not your menstrual period.   You have pain when you urinate.   You develop intestinal problems.   You have pain with sexual intercourse.  Document Released: 10/10/2004 Document Revised: 08/22/2011 Document Reviewed: 05/18/2009 [facility] Patient Information 2012 [place], [place].

## 2013-02-24 NOTE — Progress Notes (Signed)
  Subjective:    Patient ID: Kathleen Dunn, female    DOB: October 25, 1940, 72 y.o.   MRN: [id number]  HPI Patient presented to the clinic with vaginal itching for 3 days. She recently started align probiotics and another OTC probiotic powder. She wonders if that could have caused itching. She has tried douches with iodine and they help some but the itching just comes back. She does not noticed discharge. She has been using desitin and get's no relief and did one application of monistat. There is some mild pain with urination. No fever, chills, back pain.no history of recurrent UTI's or yeast infections. Post-menopausal/hysterectomy. On HRT.    Review of Systems     Objective:   Physical Exam  Constitutional: She is oriented to person, place, and time. She appears well-developed and well-nourished.  Cardiovascular: Normal rate, regular rhythm and normal heart sounds.   Pulmonary/Chest: Effort normal and breath sounds normal.  No CVA tenderness   Abdominal: Soft. Bowel sounds are normal. She exhibits no distension.  Mild tenderness over epigastric area.   Genitourinary:  Vaginal mucosa very red and irritated. Whitish yellow discharge present on exam.   Neurological: She is alert and oriented to person, place, and time.  Skin: Skin is warm and dry.  Psychiatric: She has a normal mood and affect. Her behavior is normal.          Assessment & Plan:  Vaginal irritation/dysuria- UA small blood and small leuks. Sent stat wet prep. Culture sent for UA. Discussed to stop Douching. Since leaving to go to [place] for vacation. Went ahead and sent Cipro for infection. Pt called with wet prep results being negative for yeast or BV. Pt was scared she would have an allergic reaction to cipro and wanted macrobid called in. I sent to pharm and stated if culture results give [place] any more info will call patient. Discussed I did not think probiotics were the cause of symptoms. Continue to use Align. I  suspect some of vaginal inflammation could also be some atrophy. Follow up if not improving.   Spent 30 minutes with patient and greater than 50 percent of visit spent counseling regarding vaginal irritation and treatment plan.

## 2013-02-26 ENCOUNTER — Telehealth: Payer: Self-pay | Admitting: *Deleted

## 2013-02-26 NOTE — Telephone Encounter (Signed)
Pt calls & states that she wants to try a dif abx than the macrobid.  She states that she's already taken one dose today & she still feels like there is "flames in her vagina area".  Please advise.

## 2013-02-27 ENCOUNTER — Other Ambulatory Visit: Payer: Self-pay | Admitting: Family Medicine

## 2013-02-27 LAB — URINE CULTURE: Organism ID, Bacteria: NO GROWTH

## 2013-03-10 ENCOUNTER — Encounter (HOSPITAL_BASED_OUTPATIENT_CLINIC_OR_DEPARTMENT_OTHER): Payer: Self-pay | Admitting: *Deleted

## 2013-03-10 ENCOUNTER — Emergency Department (HOSPITAL_BASED_OUTPATIENT_CLINIC_OR_DEPARTMENT_OTHER)
Admission: EM | Admit: 2013-03-10 | Discharge: 2013-03-10 | Disposition: A | Discharge status: Home / Self Care | Payer: Medicare Other | Attending: Emergency Medicine | Admitting: Emergency Medicine

## 2013-03-10 DIAGNOSIS — Y9389 Activity, other specified: Secondary | ICD-10-CM | POA: Insufficient documentation

## 2013-03-10 DIAGNOSIS — T23259A Burn of second degree of unspecified palm, initial encounter: Secondary | ICD-10-CM | POA: Insufficient documentation

## 2013-03-10 DIAGNOSIS — Y9289 Other specified places as the place of occurrence of the external cause: Secondary | ICD-10-CM | POA: Insufficient documentation

## 2013-03-10 DIAGNOSIS — T23109A Burn of first degree of unspecified hand, unspecified site, initial encounter: Secondary | ICD-10-CM | POA: Diagnosis not present

## 2013-03-10 DIAGNOSIS — T310 Burns involving less than 10% of body surface: Secondary | ICD-10-CM | POA: Diagnosis not present

## 2013-03-10 DIAGNOSIS — T23269A Burn of second degree of back of unspecified hand, initial encounter: Secondary | ICD-10-CM | POA: Diagnosis not present

## 2013-03-10 DIAGNOSIS — I10 Essential (primary) hypertension: Secondary | ICD-10-CM | POA: Insufficient documentation

## 2013-03-10 DIAGNOSIS — E079 Disorder of thyroid, unspecified: Secondary | ICD-10-CM | POA: Diagnosis not present

## 2013-03-10 DIAGNOSIS — R21 Rash and other nonspecific skin eruption: Secondary | ICD-10-CM | POA: Diagnosis not present

## 2013-03-10 DIAGNOSIS — J45909 Unspecified asthma, uncomplicated: Secondary | ICD-10-CM | POA: Insufficient documentation

## 2013-03-10 DIAGNOSIS — X12XXXA Contact with other hot fluids, initial encounter: Secondary | ICD-10-CM | POA: Insufficient documentation

## 2013-03-10 DIAGNOSIS — T23101A Burn of first degree of right hand, unspecified site, initial encounter: Secondary | ICD-10-CM

## 2013-03-10 DIAGNOSIS — Z79899 Other long term (current) drug therapy: Secondary | ICD-10-CM | POA: Diagnosis not present

## 2013-03-10 DIAGNOSIS — Z87891 Personal history of nicotine dependence: Secondary | ICD-10-CM | POA: Insufficient documentation

## 2013-03-10 MED ORDER — IBUPROFEN 600 MG PO TABS: 600.0000 mg | ORAL_TABLET | Freq: Four times a day (QID) | ORAL | Status: DC | PRN

## 2013-03-10 MED ORDER — CVS SILVER EX GEL: 2.0000 [in_us] | Freq: Every day | CUTANEOUS | Status: DC
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
## 2013-04-15 ENCOUNTER — Telehealth: Payer: Self-pay | Admitting: *Deleted

## 2013-04-15 DIAGNOSIS — L538 Other specified erythematous conditions: Secondary | ICD-10-CM | POA: Diagnosis not present

## 2013-04-15 DIAGNOSIS — B356 Tinea cruris: Secondary | ICD-10-CM | POA: Diagnosis not present

## 2013-04-15 NOTE — Telephone Encounter (Signed)
Pt left message stating that the skin on her groin area is irritated as well as the skin under her belly fold.  She would like to know if you could call her something in.  As I'm typing this andrea is on the phone with her advising her to schedule an appt with you for this.

## 2013-04-16 ENCOUNTER — Ambulatory Visit: Payer: Medicare Other | Admitting: Physician Assistant

## 2013-04-16 NOTE — Telephone Encounter (Signed)
Pt was advised at least 5 times yesterday that she would have to come in.

## 2013-04-16 NOTE — Telephone Encounter (Signed)
Yes, needs appt

## 2013-04-23 ENCOUNTER — Other Ambulatory Visit: Payer: Self-pay | Admitting: Family Medicine

## 2013-06-28 ENCOUNTER — Other Ambulatory Visit: Payer: Self-pay | Admitting: Family Medicine

## 2013-07-02 ENCOUNTER — Ambulatory Visit: Payer: Medicare Other | Admitting: Family Medicine

## 2013-07-11 ENCOUNTER — Other Ambulatory Visit: Payer: Self-pay | Admitting: Family Medicine

## 2013-07-12 NOTE — Telephone Encounter (Signed)
Ok to order TSH, free T3.

## 2013-07-12 NOTE — Telephone Encounter (Signed)
Patient called and request to know if she can have a lab order to have thyroid, T3 and request to have testing done to see why hair is falling out . Patient request to have lab order sent to [facility] lab. Patient request a call to advise that lab has been faxed. Thanks

## 2013-07-14 ENCOUNTER — Other Ambulatory Visit: Payer: Self-pay | Admitting: *Deleted

## 2013-07-14 DIAGNOSIS — E039 Hypothyroidism, unspecified: Secondary | ICD-10-CM

## 2013-07-14 NOTE — Telephone Encounter (Signed)
Pt informed and orders faxed to [phone].  Pt also stated that she had some problems with insurance paying for labs that were ordered for her back in may? She stated that the dx code would need to be changed due to either medicare or tri [facility] not paying for the labs b/c of it being a screening dx?

## 2013-07-15 NOTE — Telephone Encounter (Signed)
She can call the lab and find out what specifically was not covered.

## 2013-07-19 ENCOUNTER — Other Ambulatory Visit: Payer: Self-pay | Admitting: Family Medicine

## 2013-08-03 ENCOUNTER — Telehealth: Payer: Self-pay | Admitting: *Deleted

## 2013-08-03 DIAGNOSIS — E669 Obesity, unspecified: Secondary | ICD-10-CM

## 2013-08-03 NOTE — Telephone Encounter (Signed)
Referral placed.

## 2013-08-03 NOTE — Telephone Encounter (Signed)
Pt called & is asking for a referral to a nutritionist.  She states that she doesn't eat a lot but continues to gain weight.

## 2013-08-06 ENCOUNTER — Other Ambulatory Visit: Payer: Self-pay | Admitting: Family Medicine

## 2013-08-16 ENCOUNTER — Other Ambulatory Visit: Payer: Self-pay | Admitting: Family Medicine

## 2013-08-16 DIAGNOSIS — E039 Hypothyroidism, unspecified: Secondary | ICD-10-CM | POA: Diagnosis not present

## 2013-08-16 LAB — TSH: TSH: 1.765 u[IU]/mL (ref 0.350–4.500)

## 2013-08-17 NOTE — Progress Notes (Signed)
Quick Note:  All labs are normal. ______ 

## 2013-08-26 ENCOUNTER — Ambulatory Visit (INDEPENDENT_AMBULATORY_CARE_PROVIDER_SITE_OTHER): Payer: Medicare Other | Admitting: Family Medicine

## 2013-08-26 ENCOUNTER — Encounter: Payer: Self-pay | Admitting: Family Medicine

## 2013-08-26 VITALS — BP 122/69 | HR 88 | Wt 186.0 lb

## 2013-08-26 DIAGNOSIS — E7800 Pure hypercholesterolemia, unspecified: Secondary | ICD-10-CM

## 2013-08-26 DIAGNOSIS — Z13220 Encounter for screening for lipoid disorders: Secondary | ICD-10-CM

## 2013-08-26 DIAGNOSIS — E669 Obesity, unspecified: Secondary | ICD-10-CM | POA: Insufficient documentation

## 2013-08-26 DIAGNOSIS — M545 Low back pain, unspecified: Secondary | ICD-10-CM

## 2013-08-26 DIAGNOSIS — L659 Nonscarring hair loss, unspecified: Secondary | ICD-10-CM | POA: Diagnosis not present

## 2013-08-26 DIAGNOSIS — M25569 Pain in unspecified knee: Secondary | ICD-10-CM

## 2013-08-26 DIAGNOSIS — N951 Menopausal and female climacteric states: Secondary | ICD-10-CM

## 2013-08-26 DIAGNOSIS — E66811 Obesity, class 1: Secondary | ICD-10-CM | POA: Insufficient documentation

## 2013-08-26 DIAGNOSIS — R5381 Other malaise: Secondary | ICD-10-CM

## 2013-08-26 DIAGNOSIS — M25561 Pain in right knee: Secondary | ICD-10-CM

## 2013-08-26 DIAGNOSIS — Z1231 Encounter for screening mammogram for malignant neoplasm of breast: Secondary | ICD-10-CM

## 2013-08-26 DIAGNOSIS — R635 Abnormal weight gain: Secondary | ICD-10-CM

## 2013-08-26 MED ORDER — KETOCONAZOLE 2 % EX SHAM: 1.0000 "application " | MEDICATED_SHAMPOO | CUTANEOUS | Status: DC

## 2013-08-26 MED ORDER — BETAMETHASONE VALERATE 0.12 % EX FOAM: CUTANEOUS | Status: DC
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
## 2013-08-30 ENCOUNTER — Other Ambulatory Visit: Payer: Self-pay | Admitting: Family Medicine

## 2013-08-30 DIAGNOSIS — R350 Frequency of micturition: Secondary | ICD-10-CM

## 2013-08-31 ENCOUNTER — Ambulatory Visit: Payer: Medicare Other

## 2013-08-31 ENCOUNTER — Other Ambulatory Visit: Payer: Medicare Other

## 2013-08-31 ENCOUNTER — Ambulatory Visit: Payer: Medicare Other | Attending: Family Medicine | Admitting: Rehabilitation

## 2013-08-31 DIAGNOSIS — I10 Essential (primary) hypertension: Secondary | ICD-10-CM | POA: Insufficient documentation

## 2013-08-31 DIAGNOSIS — M25569 Pain in unspecified knee: Secondary | ICD-10-CM | POA: Insufficient documentation

## 2013-08-31 DIAGNOSIS — J45909 Unspecified asthma, uncomplicated: Secondary | ICD-10-CM | POA: Insufficient documentation

## 2013-08-31 DIAGNOSIS — M545 Low back pain, unspecified: Secondary | ICD-10-CM | POA: Insufficient documentation

## 2013-08-31 DIAGNOSIS — IMO0001 Reserved for inherently not codable concepts without codable children: Secondary | ICD-10-CM | POA: Insufficient documentation

## 2013-09-02 ENCOUNTER — Ambulatory Visit: Payer: Medicare Other | Admitting: Rehabilitation

## 2013-09-02 DIAGNOSIS — M545 Low back pain: Secondary | ICD-10-CM | POA: Diagnosis not present

## 2013-09-02 DIAGNOSIS — J45909 Unspecified asthma, uncomplicated: Secondary | ICD-10-CM | POA: Diagnosis not present

## 2013-09-02 DIAGNOSIS — I10 Essential (primary) hypertension: Secondary | ICD-10-CM | POA: Diagnosis not present

## 2013-09-02 DIAGNOSIS — IMO0001 Reserved for inherently not codable concepts without codable children: Secondary | ICD-10-CM | POA: Diagnosis not present

## 2013-09-02 DIAGNOSIS — M25569 Pain in unspecified knee: Secondary | ICD-10-CM | POA: Diagnosis not present

## 2013-09-03 ENCOUNTER — Ambulatory Visit: Payer: Medicare Other | Admitting: Rehabilitation

## 2013-09-03 DIAGNOSIS — M25569 Pain in unspecified knee: Secondary | ICD-10-CM | POA: Diagnosis not present

## 2013-09-03 DIAGNOSIS — I10 Essential (primary) hypertension: Secondary | ICD-10-CM | POA: Diagnosis not present

## 2013-09-03 DIAGNOSIS — M545 Low back pain: Secondary | ICD-10-CM | POA: Diagnosis not present

## 2013-09-03 DIAGNOSIS — IMO0001 Reserved for inherently not codable concepts without codable children: Secondary | ICD-10-CM | POA: Diagnosis not present

## 2013-09-03 DIAGNOSIS — J45909 Unspecified asthma, uncomplicated: Secondary | ICD-10-CM | POA: Diagnosis not present

## 2013-09-06 ENCOUNTER — Telehealth: Payer: Self-pay | Admitting: *Deleted

## 2013-09-06 ENCOUNTER — Other Ambulatory Visit: Payer: Self-pay | Admitting: Family Medicine

## 2013-09-06 ENCOUNTER — Ambulatory Visit: Payer: Medicare Other | Admitting: Rehabilitation

## 2013-09-06 DIAGNOSIS — M545 Low back pain: Secondary | ICD-10-CM | POA: Diagnosis not present

## 2013-09-06 DIAGNOSIS — M25569 Pain in unspecified knee: Secondary | ICD-10-CM | POA: Diagnosis not present

## 2013-09-06 DIAGNOSIS — J45909 Unspecified asthma, uncomplicated: Secondary | ICD-10-CM | POA: Diagnosis not present

## 2013-09-06 DIAGNOSIS — IMO0001 Reserved for inherently not codable concepts without codable children: Secondary | ICD-10-CM | POA: Diagnosis not present

## 2013-09-06 DIAGNOSIS — I10 Essential (primary) hypertension: Secondary | ICD-10-CM | POA: Diagnosis not present

## 2013-09-06 NOTE — Telephone Encounter (Signed)
Pt is requesting a refill on Lisinopril. It hasn't been prescribed. Please advise if we can send a prescription to pt's pharmacy.  Meyer Cory, LPN

## 2013-09-07 ENCOUNTER — Other Ambulatory Visit: Payer: Self-pay | Admitting: Family Medicine

## 2013-09-07 ENCOUNTER — Encounter: Payer: Medicare Other | Admitting: Rehabilitation

## 2013-09-07 ENCOUNTER — Other Ambulatory Visit: Payer: Self-pay | Admitting: *Deleted

## 2013-09-07 MED ORDER — LISINOPRIL 2.5 MG PO TABS: 2.5000 mg | ORAL_TABLET | Freq: Every day | ORAL | Status: DC

## 2013-09-07 MED ORDER — BENZONATATE 100 MG PO CAPS: 100.0000 mg | ORAL_CAPSULE | Freq: Three times a day (TID) | ORAL | Status: DC | PRN

## 2013-09-07 NOTE — Telephone Encounter (Signed)
Ok to fill for 90 days supply. Rx sent.

## 2013-09-14 ENCOUNTER — Ambulatory Visit: Payer: Medicare Other | Admitting: Rehabilitation

## 2013-09-14 DIAGNOSIS — I10 Essential (primary) hypertension: Secondary | ICD-10-CM | POA: Diagnosis not present

## 2013-09-14 DIAGNOSIS — M545 Low back pain: Secondary | ICD-10-CM | POA: Diagnosis not present

## 2013-09-14 DIAGNOSIS — M25569 Pain in unspecified knee: Secondary | ICD-10-CM | POA: Diagnosis not present

## 2013-09-14 DIAGNOSIS — IMO0001 Reserved for inherently not codable concepts without codable children: Secondary | ICD-10-CM | POA: Diagnosis not present

## 2013-09-14 DIAGNOSIS — J45909 Unspecified asthma, uncomplicated: Secondary | ICD-10-CM | POA: Diagnosis not present

## 2013-09-21 ENCOUNTER — Ambulatory Visit: Payer: Medicare Other | Attending: Family Medicine | Admitting: Rehabilitation

## 2013-09-21 DIAGNOSIS — M545 Low back pain, unspecified: Secondary | ICD-10-CM | POA: Insufficient documentation

## 2013-09-21 DIAGNOSIS — M25569 Pain in unspecified knee: Secondary | ICD-10-CM | POA: Insufficient documentation

## 2013-09-21 DIAGNOSIS — IMO0001 Reserved for inherently not codable concepts without codable children: Secondary | ICD-10-CM | POA: Diagnosis not present

## 2013-09-23 ENCOUNTER — Other Ambulatory Visit: Payer: Self-pay | Admitting: Family Medicine

## 2013-09-23 ENCOUNTER — Encounter: Payer: Medicare Other | Admitting: Rehabilitation

## 2013-09-23 ENCOUNTER — Telehealth: Payer: Self-pay | Admitting: *Deleted

## 2013-09-23 MED ORDER — METOPROLOL TARTRATE 25 MG PO TABS: ORAL_TABLET | ORAL | Status: DC

## 2013-09-23 NOTE — Telephone Encounter (Signed)
Pt has called stating that she received her Lisinopril from her pharmacy and you prescribed her 2.5mg  instead of 20mg . Please advise on the dose of Lisinopril she should be on.  Oscar La, LPN

## 2013-09-23 NOTE — Telephone Encounter (Signed)
OK to change to 20mg .  New Rx entered. I didn't send yet, wasn't sure if local or mail-order.  We had never Rx this before so I think it was entered in error.

## 2013-09-24 MED ORDER — LISINOPRIL 20 MG PO TABS: 20.0000 mg | ORAL_TABLET | Freq: Every day | ORAL | Status: DC

## 2013-09-24 NOTE — Telephone Encounter (Signed)
LMOM for pt letting her know that I have sent a 30 day supply to [facility] and also to [facility].  Oscar La, LPN

## 2013-09-27 ENCOUNTER — Ambulatory Visit: Payer: Medicare Other | Admitting: Rehabilitation

## 2013-09-27 DIAGNOSIS — M545 Low back pain, unspecified: Secondary | ICD-10-CM | POA: Diagnosis not present

## 2013-09-27 DIAGNOSIS — M25569 Pain in unspecified knee: Secondary | ICD-10-CM | POA: Diagnosis not present

## 2013-09-27 DIAGNOSIS — IMO0001 Reserved for inherently not codable concepts without codable children: Secondary | ICD-10-CM | POA: Diagnosis not present

## 2013-09-29 ENCOUNTER — Ambulatory Visit: Payer: Medicare Other | Admitting: Rehabilitation

## 2013-09-29 ENCOUNTER — Other Ambulatory Visit: Payer: Self-pay | Admitting: Family Medicine

## 2013-09-30 ENCOUNTER — Ambulatory Visit: Payer: Medicare Other | Admitting: Rehabilitation

## 2013-10-04 ENCOUNTER — Telehealth: Payer: Self-pay

## 2013-10-04 NOTE — Telephone Encounter (Signed)
Kathleen Dunn states she has bronchitis and wants an antibiotic. She complains of a cough with white sputum. She states she does not want to come in. Denies fever, chills, shortness of breath or sweats. She is concerned that she will bring in germs if she comes in. She states she has been able to call in and get an antibiotic for years. I advised her that Dr Madilyn Fireman is out of the office until tomorrow.

## 2013-10-04 NOTE — Telephone Encounter (Signed)
Needs appt

## 2013-10-05 NOTE — Telephone Encounter (Signed)
LMOMOscar La, LPN

## 2013-10-06 DIAGNOSIS — J40 Bronchitis, not specified as acute or chronic: Secondary | ICD-10-CM | POA: Diagnosis not present

## 2013-10-07 ENCOUNTER — Other Ambulatory Visit: Payer: Self-pay | Admitting: Family Medicine

## 2013-10-25 ENCOUNTER — Other Ambulatory Visit: Payer: Self-pay | Admitting: Family Medicine

## 2013-11-12 ENCOUNTER — Other Ambulatory Visit: Payer: Self-pay | Admitting: Family Medicine

## 2013-11-15 ENCOUNTER — Other Ambulatory Visit: Payer: Self-pay

## 2013-11-15 MED ORDER — HYDROCORTISONE-ACETIC ACID 1-2 % OT SOLN: 3.0000 [drp] | Freq: Three times a day (TID) | OTIC | Status: DC

## 2013-11-30 ENCOUNTER — Other Ambulatory Visit: Payer: Self-pay | Admitting: *Deleted

## 2013-11-30 MED ORDER — CLONAZEPAM 1 MG PO TABS: ORAL_TABLET | ORAL | Status: DC

## 2013-11-30 MED ORDER — CYANOCOBALAMIN 1000 MCG/ML IJ SOLN: 100.0000 ug | INTRAMUSCULAR | Status: DC

## 2013-12-03 ENCOUNTER — Other Ambulatory Visit: Payer: Self-pay | Admitting: Family Medicine

## 2013-12-04 ENCOUNTER — Other Ambulatory Visit: Payer: Self-pay | Admitting: Family Medicine

## 2013-12-09 ENCOUNTER — Other Ambulatory Visit: Payer: Self-pay | Admitting: *Deleted

## 2013-12-09 MED ORDER — CYANOCOBALAMIN 1000 MCG/ML IJ SOLN: 100.0000 ug | INTRAMUSCULAR | Status: DC

## 2013-12-13 ENCOUNTER — Other Ambulatory Visit: Payer: Self-pay | Admitting: *Deleted

## 2013-12-13 MED ORDER — CYANOCOBALAMIN 1000 MCG/ML IJ SOLN
1000.0000 ug | INTRAMUSCULAR | Status: DC
Start: 2013-12-13 — End: 2014-05-20

## 2014-02-01 ENCOUNTER — Other Ambulatory Visit: Payer: Self-pay | Admitting: *Deleted

## 2014-02-01 MED ORDER — LEVOTHYROXINE SODIUM 50 MCG PO TABS: ORAL_TABLET | ORAL | Status: DC

## 2014-02-01 MED ORDER — RANITIDINE HCL 150 MG PO CAPS: 150.0000 mg | ORAL_CAPSULE | Freq: Two times a day (BID) | ORAL | Status: DC | PRN

## 2014-02-01 MED ORDER — CLONAZEPAM 1 MG PO TABS: ORAL_TABLET | ORAL | Status: DC

## 2014-02-01 NOTE — Telephone Encounter (Signed)
Pt is in [place] and would like rx to be sent there.Kathleen Dunn

## 2014-02-10 ENCOUNTER — Other Ambulatory Visit: Payer: Self-pay | Admitting: Family Medicine

## 2014-02-14 ENCOUNTER — Other Ambulatory Visit: Payer: Self-pay | Admitting: Family Medicine

## 2014-02-20 ENCOUNTER — Other Ambulatory Visit: Payer: Self-pay | Admitting: Family Medicine

## 2014-02-28 ENCOUNTER — Other Ambulatory Visit: Payer: Self-pay | Admitting: Family Medicine

## 2014-03-04 ENCOUNTER — Other Ambulatory Visit: Payer: Self-pay | Admitting: *Deleted

## 2014-03-04 MED ORDER — LEVOTHYROXINE SODIUM 50 MCG PO TABS: ORAL_TABLET | ORAL | Status: DC

## 2014-03-23 ENCOUNTER — Other Ambulatory Visit: Payer: Self-pay | Admitting: *Deleted

## 2014-03-23 DIAGNOSIS — R079 Chest pain, unspecified: Secondary | ICD-10-CM | POA: Diagnosis not present

## 2014-03-23 DIAGNOSIS — R110 Nausea: Secondary | ICD-10-CM | POA: Diagnosis not present

## 2014-03-23 DIAGNOSIS — R0602 Shortness of breath: Secondary | ICD-10-CM | POA: Diagnosis not present

## 2014-03-23 MED ORDER — CLONAZEPAM 1 MG PO TABS: ORAL_TABLET | ORAL | Status: DC

## 2014-03-23 NOTE — Telephone Encounter (Signed)
Pt informed that she will need to make a f/u appt for future refills of her meds.Kathleen Dunn [place]

## 2014-03-31 ENCOUNTER — Other Ambulatory Visit: Payer: Self-pay | Admitting: Family Medicine

## 2014-04-07 DIAGNOSIS — F411 Generalized anxiety disorder: Secondary | ICD-10-CM | POA: Diagnosis not present

## 2014-04-07 DIAGNOSIS — K589 Irritable bowel syndrome without diarrhea: Secondary | ICD-10-CM | POA: Diagnosis not present

## 2014-04-12 ENCOUNTER — Telehealth: Payer: Self-pay | Admitting: Family Medicine

## 2014-04-12 NOTE — Telephone Encounter (Signed)
FYI. Dr Suzi Roots patient cancelled her appt for 7/30 because she is currently in [place] and depends on her daughter for transportation. Patient states she wants to come in but is  prevented because of transportaion.

## 2014-04-14 ENCOUNTER — Other Ambulatory Visit: Payer: Self-pay

## 2014-04-14 ENCOUNTER — Ambulatory Visit: Payer: Medicare Other | Admitting: Family Medicine

## 2014-04-14 MED ORDER — ESTRADIOL CYPIONATE 5 MG/ML IM OIL: 5.0000 mg | TOPICAL_OIL | INTRAMUSCULAR | Status: DC

## 2014-04-14 MED ORDER — LEVOTHYROXINE SODIUM 50 MCG PO TABS: ORAL_TABLET | ORAL | Status: DC

## 2014-04-19 DIAGNOSIS — R0989 Other specified symptoms and signs involving the circulatory and respiratory systems: Secondary | ICD-10-CM | POA: Diagnosis not present

## 2014-04-19 DIAGNOSIS — N289 Disorder of kidney and ureter, unspecified: Secondary | ICD-10-CM | POA: Diagnosis not present

## 2014-04-19 DIAGNOSIS — R4182 Altered mental status, unspecified: Secondary | ICD-10-CM | POA: Diagnosis not present

## 2014-04-19 DIAGNOSIS — R1012 Left upper quadrant pain: Secondary | ICD-10-CM | POA: Diagnosis not present

## 2014-04-19 DIAGNOSIS — K5732 Diverticulitis of large intestine without perforation or abscess without bleeding: Secondary | ICD-10-CM | POA: Diagnosis not present

## 2014-04-19 DIAGNOSIS — R0602 Shortness of breath: Secondary | ICD-10-CM | POA: Diagnosis not present

## 2014-04-19 DIAGNOSIS — R109 Unspecified abdominal pain: Secondary | ICD-10-CM | POA: Diagnosis not present

## 2014-04-19 DIAGNOSIS — R0609 Other forms of dyspnea: Secondary | ICD-10-CM | POA: Diagnosis not present

## 2014-04-20 ENCOUNTER — Other Ambulatory Visit: Payer: Self-pay | Admitting: Family Medicine

## 2014-04-22 DIAGNOSIS — K5732 Diverticulitis of large intestine without perforation or abscess without bleeding: Secondary | ICD-10-CM | POA: Diagnosis not present

## 2014-04-22 DIAGNOSIS — R1110 Vomiting, unspecified: Secondary | ICD-10-CM | POA: Diagnosis not present

## 2014-04-22 DIAGNOSIS — K5730 Diverticulosis of large intestine without perforation or abscess without bleeding: Secondary | ICD-10-CM | POA: Diagnosis not present

## 2014-04-27 ENCOUNTER — Other Ambulatory Visit: Payer: Self-pay | Admitting: Family Medicine

## 2014-05-02 DIAGNOSIS — R109 Unspecified abdominal pain: Secondary | ICD-10-CM | POA: Diagnosis not present

## 2014-05-02 DIAGNOSIS — K5732 Diverticulitis of large intestine without perforation or abscess without bleeding: Secondary | ICD-10-CM | POA: Diagnosis not present

## 2014-05-03 DIAGNOSIS — R404 Transient alteration of awareness: Secondary | ICD-10-CM | POA: Diagnosis not present

## 2014-05-03 DIAGNOSIS — R509 Fever, unspecified: Secondary | ICD-10-CM | POA: Diagnosis not present

## 2014-05-03 DIAGNOSIS — R5381 Other malaise: Secondary | ICD-10-CM | POA: Diagnosis not present

## 2014-05-03 DIAGNOSIS — J3489 Other specified disorders of nose and nasal sinuses: Secondary | ICD-10-CM | POA: Diagnosis not present

## 2014-05-03 DIAGNOSIS — E871 Hypo-osmolality and hyponatremia: Secondary | ICD-10-CM | POA: Diagnosis not present

## 2014-05-03 DIAGNOSIS — J0110 Acute frontal sinusitis, unspecified: Secondary | ICD-10-CM | POA: Diagnosis not present

## 2014-05-03 DIAGNOSIS — R110 Nausea: Secondary | ICD-10-CM | POA: Diagnosis not present

## 2014-05-03 DIAGNOSIS — Z79899 Other long term (current) drug therapy: Secondary | ICD-10-CM | POA: Diagnosis not present

## 2014-05-03 DIAGNOSIS — E079 Disorder of thyroid, unspecified: Secondary | ICD-10-CM | POA: Diagnosis not present

## 2014-05-03 DIAGNOSIS — R51 Headache: Secondary | ICD-10-CM | POA: Diagnosis not present

## 2014-05-03 DIAGNOSIS — I10 Essential (primary) hypertension: Secondary | ICD-10-CM | POA: Diagnosis not present

## 2014-05-04 ENCOUNTER — Other Ambulatory Visit: Payer: Self-pay | Admitting: Family Medicine

## 2014-05-11 ENCOUNTER — Other Ambulatory Visit: Payer: Self-pay | Admitting: Family Medicine

## 2014-05-20 ENCOUNTER — Encounter: Payer: Self-pay | Admitting: Family Medicine

## 2014-05-20 ENCOUNTER — Ambulatory Visit (INDEPENDENT_AMBULATORY_CARE_PROVIDER_SITE_OTHER): Payer: Medicare Other | Admitting: Family Medicine

## 2014-05-20 VITALS — BP 128/72 | HR 85 | Wt 182.0 lb

## 2014-05-20 DIAGNOSIS — L659 Nonscarring hair loss, unspecified: Secondary | ICD-10-CM

## 2014-05-20 DIAGNOSIS — Z1211 Encounter for screening for malignant neoplasm of colon: Secondary | ICD-10-CM

## 2014-05-20 DIAGNOSIS — IMO0001 Reserved for inherently not codable concepts without codable children: Secondary | ICD-10-CM

## 2014-05-20 DIAGNOSIS — R911 Solitary pulmonary nodule: Secondary | ICD-10-CM | POA: Insufficient documentation

## 2014-05-20 DIAGNOSIS — I10 Essential (primary) hypertension: Secondary | ICD-10-CM

## 2014-05-20 DIAGNOSIS — K5732 Diverticulitis of large intestine without perforation or abscess without bleeding: Secondary | ICD-10-CM

## 2014-05-20 DIAGNOSIS — E039 Hypothyroidism, unspecified: Secondary | ICD-10-CM

## 2014-05-20 MED ORDER — SIMVASTATIN 40 MG PO TABS: 40.0000 mg | ORAL_TABLET | Freq: Every day | ORAL | Status: DC

## 2014-05-20 MED ORDER — TRIAMCINOLONE ACETONIDE 0.1 % EX CREA: TOPICAL_CREAM | Freq: Two times a day (BID) | CUTANEOUS | Status: DC

## 2014-05-20 MED ORDER — KETOCONAZOLE 2 % EX SHAM: 1.0000 "application " | MEDICATED_SHAMPOO | CUTANEOUS | Status: DC

## 2014-05-20 MED ORDER — CLONAZEPAM 1 MG PO TABS: ORAL_TABLET | ORAL | Status: DC

## 2014-05-20 MED ORDER — CYANOCOBALAMIN 1000 MCG/ML IJ SOLN: INTRAMUSCULAR | Status: DC

## 2014-05-20 NOTE — Assessment & Plan Note (Signed)
42mm seen on CT done in [facility] 03/2014.  Recommend repeat CT in 12 mo

## 2014-05-20 NOTE — Progress Notes (Signed)
Subjective:    Patient ID: Kathleen Dunn, female    DOB: Nov 28, 1940, 73 y.o.   MRN: [id number]  HPI Patient went to the emergency department in [place]. On 04/26/2014. She had a CT scan that showed mild diverticulitis and inflammation in the sigmoid colon. She was also noted to have renal cysts. And small right lung nodule is seen as well that was less than 4 mm. Followup was recommended in 12 months because of smoking history. She was given levaquin 750mg  QD and metronidazole 500mg  TID.  It gave her nausea and diarrhea.  The doctoer there stopped her antibiotic prematurely and put her on something else.    She was also seen in July at [facility] urgent care. It looks like based on the note she was diagnosed with possible sinustis. She was treated with azithromycin, prednisone and given an albuterol inhaler. Says has still has some HA. Diarrhea has resolved.    Hypertension- Pt denies chest pain, SOB, dizziness, or heart palpitations.  Taking meds as directed w/o problems.  Denies medication side effects.    Just complains of occasionally getting choked on food. It seems to particularly be red and bacon. She said comes and goes. She said she has even on a Couple years without it bothering her and it started back again. She says it happens only rarely and has not progressed in frequency or occurrence. She notices it seems to be a little bit better when she is on a PPI.  Hyperlipidemia - Says not sure at [place] she no longer is taking a cholesterol pill. Also knows she is overdue for lab.    Review of Systems  BP 128/72  Pulse 85  Wt 182 lb (82.555 kg)    Allergies  Allergen Reactions  . Codeine   . Erythromycin   . Morphine And Related   . Penicillamine   . Sulfonamide Derivatives   . Tramadol Itching  . Vistaril [Hydroxyzine Hcl] Other (See Comments)    Sedating.     Past Medical History  Diagnosis Date  . Asthma   . Hypertension   . Thyroid disease     Past  Surgical History  Procedure Laterality Date  . Breast surgery      benign bx of left breast  . Abdominal hysterectomy  age 21    oophorectomy    History   Social History  . Marital Status: Divorced    Spouse Name: N/A    Number of Children: N/A  . Years of Education: N/A   Occupational History  . Not on file.   Social History Main Topics  . Smoking status: Former Smoker    Quit date: 09/17/1971  . Smokeless tobacco: Never Used  . Alcohol Use: No  . Drug Use: No  . Sexual Activity: Not on file   Other Topics Concern  . Not on file   Social History Narrative  . No narrative on file    Family History  Problem Relation Age of Onset  . Heart attack Father     Outpatient Encounter Prescriptions as of 05/20/2014  Medication Sig  . Betamethasone Valerate 0.12 % foam Apply once day to the scalp prn  . Cholecalciferol (VITAMIN D3) 1000 UNITS CAPS Take 1 capsule by mouth daily.    . clonazePAM (KLONOPIN) 1 MG tablet TAKE 1 TABLET TWICE A DAY AS NEEDED.  . cyanocobalamin (,VITAMIN B-12,) 1000 MCG/ML injection 0.5 mL into the the muscle every 14 days.  Marland Kitchen estradiol  cypionate (DEPO-ESTRADIOL) 5 MG/ML injection Inject 1 mL (5 mg total) into the muscle every 28 (twenty-eight) days.  Marland Kitchen ketoconazole (NIZORAL) 2 % shampoo Apply 1 application topically 2 (two) times a week.  . levothyroxine (SYNTHROID, LEVOTHROID) 50 MCG tablet TAKE 1 TABLET BY MOUTH EVERY DAY  . lisinopril (PRINIVIL,ZESTRIL) 20 MG tablet TAKE 1 TABLET DAILY  . metoprolol tartrate (LOPRESSOR) 25 MG tablet TAKE 2 TABLETS DAILY  . NASONEX 50 MCG/ACT nasal spray USE 2 SPRAYS NASALLY DAILY AS NEEDED  . NEXIUM 40 MG capsule TAKE 1 CAPSULE DAILY BEFORE BREAKFAST  . QVAR 40 MCG/ACT inhaler USE 2 INHALATIONS INTO THE LUNGS TWICE A DAY  . ranitidine (ZANTAC) 150 MG capsule Take 1 capsule (150 mg total) by mouth 2 (two) times daily as needed.  . traMADol (ULTRAM) 50 MG tablet Take 1 tablet (50 mg total) by mouth every 6 (six)  hours as needed.  . triamcinolone cream (KENALOG) 0.1 % Apply topically 2 (two) times daily.  Marland Kitchen triamterene-hydrochlorothiazide (DYAZIDE) 37.5-25 MG per capsule TAKE ONE CAPSULE BY MOUTH EVERY MORNING  . [DISCONTINUED] acetic acid-hydrocortisone (VOSOL-HC) otic solution Place 3 drops into both ears 3 (three) times daily.  . [DISCONTINUED] clonazePAM (KLONOPIN) 1 MG tablet TAKE 1 TABLET TWICE A DAY AS NEEDED. APPOINTMENT REQUIRED FOR FUTURE REFILLS.  . [DISCONTINUED] cyanocobalamin (,VITAMIN B-12,) 1000 MCG/ML injection Inject 1 mL (1,000 mcg total) into the muscle every 30 (thirty) days.  . [DISCONTINUED] ketoconazole (NIZORAL) 2 % shampoo Apply 1 application topically 2 (two) times a week.  . [DISCONTINUED] triamcinolone cream (KENALOG) 0.1 % Apply topically 2 (two) times daily.  . [DISCONTINUED] Wound Dressings ([employer] SILVER) GEL Apply 2 inches topically daily.  . simvastatin (ZOCOR) 40 MG tablet Take 1 tablet (40 mg total) by mouth at bedtime.  . [DISCONTINUED] ibuprofen (ADVIL,MOTRIN) 600 MG tablet Take 1 tablet (600 mg total) by mouth every 6 (six) hours as needed for pain.  . [DISCONTINUED] lisinopril (PRINIVIL,ZESTRIL) 20 MG tablet TAKE 1 TABLET BY MOUTH EVERY DAY  . [DISCONTINUED] NEXIUM 40 MG capsule TAKE ONE CAPSULE DAILY BEFORE BREAKFAST  . [DISCONTINUED] nitrofurantoin, macrocrystal-monohydrate, (MACROBID) 100 MG capsule Take 1 capsule (100 mg total) by mouth 2 (two) times daily. For 7 days.          Objective:   Physical Exam  Constitutional: She is oriented to person, place, and time. She appears well-developed and well-nourished.  HENT:  Head: Normocephalic and atraumatic.  Right Ear: External ear normal.  Left Ear: External ear normal.  Nose: Nose normal.  Mouth/Throat: Oropharynx is clear and moist.  TMs and canals are clear.   Eyes: Conjunctivae and EOM are normal. Pupils are equal, round, and reactive to light.  Neck: Neck supple. No thyromegaly present.   Cardiovascular: Normal rate, regular rhythm and normal heart sounds.   Pulmonary/Chest: Effort normal and breath sounds normal. She has no wheezes.  Abdominal: Soft. Bowel sounds are normal. She exhibits no distension and no mass. There is tenderness. There is no rebound and no guarding.  Some mild tenderness in the left lower quadrant. No rebound or guarding.  Musculoskeletal: She exhibits no edema.  Lymphadenopathy:    She has no cervical adenopathy.  Neurological: She is alert and oriented to person, place, and time.  Skin: Skin is warm and dry.  Psychiatric: She has a normal mood and affect.          Assessment & Plan:  Diverticulitis - Resolved.  Strongly recommended colonoscopy. Spent 15 min just convincing  her to get a colonoscopy. She is 37 and has never had a screening colonoscopy.  CT lung nodule - Recommend repeat CT in one year.  Former smoker.  She did smoke for about 9 years total.  Acute sinusitis - she is getting better though not completely resolved but want to avoid any antibiotics at this point as she has had 4 courses of antibiotic in the last 2.5 months.  If she suddenly gets worse she can let me know.  Dysphagia-encouraged her to keep an eye on it. Certainly if it happens more frequently or progresses then she may need to be evaluated for possible esophageal stricture. If she's noticing she's having any heartburn and encouraged her to use her PPI.  Hypothyroidism-due to recheck levels.  Hyperlipidemia-Due to recheck lipids.  Will start simvastatin 40mg  daily.   HTN - refilled meds today.  Well controlled on recheck of BP.   Time spent 50 min, >50% spent counseling about diverticulitis, her need for colonoscopy, acute sinusitis, dysphagia, hypothyroidism, hyperlipidemia, hypertension

## 2014-05-31 ENCOUNTER — Ambulatory Visit (INDEPENDENT_AMBULATORY_CARE_PROVIDER_SITE_OTHER): Payer: Medicare Other

## 2014-05-31 ENCOUNTER — Other Ambulatory Visit: Payer: Self-pay | Admitting: Family Medicine

## 2014-05-31 ENCOUNTER — Ambulatory Visit: Payer: Medicare Other

## 2014-05-31 DIAGNOSIS — M899 Disorder of bone, unspecified: Secondary | ICD-10-CM | POA: Diagnosis not present

## 2014-05-31 DIAGNOSIS — Z1231 Encounter for screening mammogram for malignant neoplasm of breast: Secondary | ICD-10-CM

## 2014-05-31 DIAGNOSIS — Z13820 Encounter for screening for osteoporosis: Secondary | ICD-10-CM

## 2014-05-31 DIAGNOSIS — M949 Disorder of cartilage, unspecified: Secondary | ICD-10-CM | POA: Diagnosis not present

## 2014-05-31 DIAGNOSIS — N951 Menopausal and female climacteric states: Secondary | ICD-10-CM

## 2014-06-15 ENCOUNTER — Other Ambulatory Visit: Payer: Medicare Other

## 2014-06-27 ENCOUNTER — Other Ambulatory Visit: Payer: Self-pay | Admitting: Family Medicine

## 2014-07-01 ENCOUNTER — Other Ambulatory Visit: Payer: Self-pay | Admitting: Family Medicine

## 2014-07-04 ENCOUNTER — Other Ambulatory Visit: Payer: Self-pay | Admitting: *Deleted

## 2014-07-04 MED ORDER — CYANOCOBALAMIN 1000 MCG/ML IJ SOLN: INTRAMUSCULAR | Status: DC

## 2014-07-04 MED ORDER — TRIAMTERENE-HCTZ 37.5-25 MG PO CAPS: 1.0000 | ORAL_CAPSULE | Freq: Every day | ORAL | Status: DC

## 2014-07-04 MED ORDER — RANITIDINE HCL 150 MG PO CAPS: 150.0000 mg | ORAL_CAPSULE | Freq: Two times a day (BID) | ORAL | Status: DC | PRN

## 2014-07-08 ENCOUNTER — Other Ambulatory Visit: Payer: Self-pay | Admitting: *Deleted

## 2014-07-08 MED ORDER — CYANOCOBALAMIN 1000 MCG/ML IJ SOLN: INTRAMUSCULAR | Status: DC

## 2014-07-13 ENCOUNTER — Other Ambulatory Visit: Payer: Self-pay | Admitting: *Deleted

## 2014-07-13 MED ORDER — BENZONATATE 100 MG PO CAPS: 100.0000 mg | ORAL_CAPSULE | Freq: Three times a day (TID) | ORAL | Status: DC | PRN

## 2014-07-13 MED ORDER — CYANOCOBALAMIN 1000 MCG/ML IJ SOLN: INTRAMUSCULAR | Status: DC

## 2014-07-13 MED ORDER — CLONAZEPAM 1 MG PO TABS: ORAL_TABLET | ORAL | Status: DC

## 2014-07-20 ENCOUNTER — Other Ambulatory Visit: Payer: Self-pay

## 2014-07-20 MED ORDER — BETAMETHASONE VALERATE 0.12 % EX FOAM: CUTANEOUS | Status: DC

## 2014-07-22 ENCOUNTER — Other Ambulatory Visit: Payer: Self-pay | Admitting: Family Medicine

## 2014-08-08 ENCOUNTER — Other Ambulatory Visit: Payer: Self-pay | Admitting: Family Medicine

## 2014-09-06 ENCOUNTER — Other Ambulatory Visit: Payer: Self-pay | Admitting: Family Medicine

## 2014-09-10 ENCOUNTER — Other Ambulatory Visit: Payer: Self-pay | Admitting: Family Medicine

## 2014-09-13 ENCOUNTER — Other Ambulatory Visit: Payer: Self-pay | Admitting: Family Medicine

## 2014-09-25 ENCOUNTER — Other Ambulatory Visit: Payer: Self-pay | Admitting: Family Medicine

## 2014-10-18 ENCOUNTER — Other Ambulatory Visit: Payer: Self-pay

## 2014-10-18 MED ORDER — CYANOCOBALAMIN 1000 MCG/ML IJ SOLN: INTRAMUSCULAR | Status: DC

## 2014-10-29 ENCOUNTER — Other Ambulatory Visit: Payer: Self-pay | Admitting: Family Medicine

## 2014-10-31 ENCOUNTER — Telehealth: Payer: Self-pay | Admitting: Family Medicine

## 2014-10-31 NOTE — Telephone Encounter (Signed)
Patient called advised she left a messg on tonya vm that she needs a refill for Levothyroxine and benzonatate called into pharmacy today especially for the Levothyroxine she is completely out of that med and said she can be without it. Gideon. Thanks.

## 2014-11-01 ENCOUNTER — Other Ambulatory Visit: Payer: Self-pay

## 2014-11-01 DIAGNOSIS — R5382 Chronic fatigue, unspecified: Secondary | ICD-10-CM

## 2014-11-01 DIAGNOSIS — G9332 Myalgic encephalomyelitis/chronic fatigue syndrome: Secondary | ICD-10-CM

## 2014-11-01 MED ORDER — BENZONATATE 100 MG PO CAPS: 100.0000 mg | ORAL_CAPSULE | Freq: Three times a day (TID) | ORAL | Status: DC | PRN

## 2014-11-01 MED ORDER — LEVOTHYROXINE SODIUM 50 MCG PO TABS: ORAL_TABLET | ORAL | Status: DC

## 2014-11-01 MED ORDER — HYDROCORTISONE-ACETIC ACID 1-2 % OT SOLN: 3.0000 [drp] | Freq: Three times a day (TID) | OTIC | Status: DC

## 2014-11-03 NOTE — Telephone Encounter (Signed)
Kathleen Dunn spoke w/pt.Kathleen Dunn [place]

## 2014-11-21 ENCOUNTER — Other Ambulatory Visit: Payer: Self-pay | Admitting: Family Medicine

## 2014-11-23 ENCOUNTER — Other Ambulatory Visit: Payer: Self-pay | Admitting: Family Medicine

## 2014-12-02 ENCOUNTER — Other Ambulatory Visit: Payer: Self-pay | Admitting: Family Medicine

## 2014-12-02 NOTE — Telephone Encounter (Signed)
Talked with patient and advised we will need to see her for an appointment before any further prescriptions can be written. Patient advised we will write for half a month, but she will need to be seen before any more are written. Patient states she is working for the [employer] and does not know her schedule ahead of time. Advised Pt to contact us and we will put her on the schedule. Verbalized understanding.

## 2014-12-05 ENCOUNTER — Other Ambulatory Visit: Payer: Self-pay | Admitting: Family Medicine

## 2014-12-06 ENCOUNTER — Other Ambulatory Visit: Payer: Self-pay | Admitting: Family Medicine

## 2014-12-06 MED ORDER — ESTRADIOL CYPIONATE 5 MG/ML IM OIL: 5.0000 mg | TOPICAL_OIL | INTRAMUSCULAR | Status: DC

## 2014-12-06 NOTE — Telephone Encounter (Signed)
Spoke with Patient in detail regarding medication refills. Informed patient that blood work and a yearly exam needed to be completed. Patient states she works on [employer] trail and never knows when she will be in town. After 25 minutes, an appointment was made for 01/12/15. Patient was advised to call clinic if she needs to change this appointment. Stressed the importance of being seen for any future refills she may need. Verbalized understanding. No further questions.

## 2014-12-23 ENCOUNTER — Telehealth: Payer: Self-pay | Admitting: Family Medicine

## 2014-12-23 ENCOUNTER — Other Ambulatory Visit: Payer: Self-pay | Admitting: Physician Assistant

## 2014-12-23 MED ORDER — BENZONATATE 100 MG PO CAPS: 100.0000 mg | ORAL_CAPSULE | Freq: Three times a day (TID) | ORAL | Status: DC | PRN

## 2014-12-23 MED ORDER — DOXYCYCLINE HYCLATE 100 MG PO TABS: 100.0000 mg | ORAL_TABLET | Freq: Two times a day (BID) | ORAL | Status: DC

## 2014-12-23 NOTE — Telephone Encounter (Signed)
I will send doxycycline and tessalon pearle to pharmacy. If not better then needs appt.

## 2014-12-23 NOTE — Telephone Encounter (Signed)
Contacted Pt, advised that Rx was sent to the pharmacy. Verbalized understanding. Pt states she will go get Rx and if not better will go by Urgent Care or call us back on Monday. Advised this was fine. No further questions.

## 2014-12-23 NOTE — Telephone Encounter (Signed)
Patient has had a cough for the past 2-3 days, Pt reports coughing up a yellow/green substance. Pt wants to know if we can call in an antibiotic and tessalon pearls to her pharmacy. Pt states her car is in the shop and she doesn't have any friends/family who would be able to take her to Urgent Care. Please advise.

## 2014-12-30 ENCOUNTER — Other Ambulatory Visit: Payer: Self-pay | Admitting: *Deleted

## 2014-12-30 ENCOUNTER — Other Ambulatory Visit: Payer: Self-pay | Admitting: Family Medicine

## 2014-12-30 DIAGNOSIS — E039 Hypothyroidism, unspecified: Secondary | ICD-10-CM | POA: Diagnosis not present

## 2014-12-30 DIAGNOSIS — I10 Essential (primary) hypertension: Secondary | ICD-10-CM | POA: Diagnosis not present

## 2014-12-30 DIAGNOSIS — R7989 Other specified abnormal findings of blood chemistry: Secondary | ICD-10-CM

## 2014-12-30 DIAGNOSIS — L659 Nonscarring hair loss, unspecified: Secondary | ICD-10-CM | POA: Diagnosis not present

## 2014-12-30 DIAGNOSIS — R748 Abnormal levels of other serum enzymes: Secondary | ICD-10-CM

## 2014-12-30 DIAGNOSIS — E870 Hyperosmolality and hypernatremia: Secondary | ICD-10-CM

## 2014-12-30 LAB — COMPLETE METABOLIC PANEL WITH GFR
ALBUMIN: 4 g/dL (ref 3.5–5.2)
ALK PHOS: 62 U/L (ref 39–117)
ALT: 22 U/L (ref 0–35)
AST: 19 U/L (ref 0–37)
BILIRUBIN TOTAL: 0.5 mg/dL (ref 0.2–1.2)
BUN: 13 mg/dL (ref 6–23)
CHLORIDE: 90 meq/L — AB (ref 96–112)
CO2: 29 mEq/L (ref 19–32)
Calcium: 9.5 mg/dL (ref 8.4–10.5)
Creat: 0.82 mg/dL (ref 0.50–1.10)
GFR, EST AFRICAN AMERICAN: 83 mL/min
GFR, Est Non African American: 72 mL/min
Glucose, Bld: 120 mg/dL — ABNORMAL HIGH (ref 70–99)
POTASSIUM: 3.8 meq/L (ref 3.5–5.3)
SODIUM: 130 meq/L — AB (ref 135–145)
Total Protein: 6.9 g/dL (ref 6.0–8.3)

## 2014-12-30 LAB — FERRITIN: FERRITIN: 149 ng/mL (ref 10–291)

## 2014-12-30 LAB — TSH: TSH: 1.613 u[IU]/mL (ref 0.350–4.500)

## 2014-12-30 MED ORDER — LEVOTHYROXINE SODIUM 50 MCG PO TABS: ORAL_TABLET | ORAL | Status: DC

## 2015-01-05 ENCOUNTER — Telehealth: Payer: Self-pay

## 2015-01-05 DIAGNOSIS — E7800 Pure hypercholesterolemia, unspecified: Secondary | ICD-10-CM

## 2015-01-05 NOTE — Telephone Encounter (Signed)
Faxed new order form to [facility] lab in [employer]. FAX [phone]

## 2015-01-09 ENCOUNTER — Other Ambulatory Visit: Payer: Self-pay | Admitting: Physician Assistant

## 2015-01-10 ENCOUNTER — Other Ambulatory Visit: Payer: Self-pay

## 2015-01-10 ENCOUNTER — Other Ambulatory Visit: Payer: Self-pay | Admitting: Family Medicine

## 2015-01-10 MED ORDER — BENZONATATE 100 MG PO CAPS: 100.0000 mg | ORAL_CAPSULE | Freq: Three times a day (TID) | ORAL | Status: DC | PRN

## 2015-01-10 MED ORDER — CLONAZEPAM 1 MG PO TABS: 1.0000 mg | ORAL_TABLET | Freq: Two times a day (BID) | ORAL | Status: DC

## 2015-01-12 ENCOUNTER — Telehealth: Payer: Self-pay | Admitting: Family Medicine

## 2015-01-12 ENCOUNTER — Encounter: Payer: Self-pay | Admitting: Family Medicine

## 2015-01-12 ENCOUNTER — Ambulatory Visit: Payer: Medicare Other | Admitting: Family Medicine

## 2015-01-12 ENCOUNTER — Ambulatory Visit (INDEPENDENT_AMBULATORY_CARE_PROVIDER_SITE_OTHER): Payer: Medicare Other | Admitting: Family Medicine

## 2015-01-12 VITALS — BP 152/82 | HR 85 | Temp 98.0°F | Ht 61.0 in | Wt 178.0 lb

## 2015-01-12 DIAGNOSIS — R7309 Other abnormal glucose: Secondary | ICD-10-CM | POA: Diagnosis not present

## 2015-01-12 DIAGNOSIS — E871 Hypo-osmolality and hyponatremia: Secondary | ICD-10-CM

## 2015-01-12 DIAGNOSIS — R519 Headache, unspecified: Secondary | ICD-10-CM

## 2015-01-12 DIAGNOSIS — R51 Headache: Secondary | ICD-10-CM

## 2015-01-12 DIAGNOSIS — IMO0001 Reserved for inherently not codable concepts without codable children: Secondary | ICD-10-CM

## 2015-01-12 DIAGNOSIS — R911 Solitary pulmonary nodule: Secondary | ICD-10-CM | POA: Diagnosis not present

## 2015-01-12 DIAGNOSIS — Z0000 Encounter for general adult medical examination without abnormal findings: Secondary | ICD-10-CM | POA: Diagnosis not present

## 2015-01-12 DIAGNOSIS — E039 Hypothyroidism, unspecified: Secondary | ICD-10-CM | POA: Diagnosis not present

## 2015-01-12 DIAGNOSIS — R7301 Impaired fasting glucose: Secondary | ICD-10-CM | POA: Diagnosis not present

## 2015-01-12 DIAGNOSIS — D510 Vitamin B12 deficiency anemia due to intrinsic factor deficiency: Secondary | ICD-10-CM | POA: Diagnosis not present

## 2015-01-12 LAB — POCT GLYCOSYLATED HEMOGLOBIN (HGB A1C): HEMOGLOBIN A1C: 6

## 2015-01-12 MED ORDER — SIMVASTATIN 40 MG PO TABS: 40.0000 mg | ORAL_TABLET | Freq: Every day | ORAL | Status: DC

## 2015-01-12 MED ORDER — ESTRADIOL CYPIONATE 5 MG/ML IM OIL: 5.0000 mg | TOPICAL_OIL | INTRAMUSCULAR | Status: DC

## 2015-01-12 MED ORDER — LEVOTHYROXINE SODIUM 50 MCG PO TABS: ORAL_TABLET | ORAL | Status: DC

## 2015-01-12 MED ORDER — RANITIDINE HCL 150 MG PO CAPS: ORAL_CAPSULE | ORAL | Status: DC

## 2015-01-12 MED ORDER — BENZONATATE 100 MG PO CAPS: 100.0000 mg | ORAL_CAPSULE | Freq: Three times a day (TID) | ORAL | Status: DC | PRN

## 2015-01-12 MED ORDER — FLUTICASONE PROPIONATE 50 MCG/ACT NA SUSP
2.0000 | Freq: Every day | NASAL | Status: DC
Start: 2015-01-12 — End: 2015-11-03

## 2015-01-12 MED ORDER — METOPROLOL TARTRATE 25 MG PO TABS: 50.0000 mg | ORAL_TABLET | Freq: Every day | ORAL | Status: DC

## 2015-01-12 MED ORDER — LISINOPRIL 20 MG PO TABS: 20.0000 mg | ORAL_TABLET | Freq: Every day | ORAL | Status: DC

## 2015-01-12 NOTE — Telephone Encounter (Signed)
Pt informed of recommendations. She does not have Advanced directives and has not had an eye exam.Kathleen Dunn, [place] Crocker

## 2015-01-12 NOTE — Telephone Encounter (Signed)
Please call patient: Her sodium and chloride were low on recent lab work. She needs to cut her diuretic down. Recommend take 1 tab every other day and then let's recheck a BMP in about 2 weeks. Also her test for diabetes showed that she is prediabetic. Recommend work on avoiding concentrated sweets and watching portion sizes on carbohydrates. This is in addition to regular exercise and weight loss. We will recheck the A1c in 6 months when she comes back in.

## 2015-01-12 NOTE — Progress Notes (Addendum)
Subjective:    Kathleen Dunn is a 74 y.o. female who presents for Medicare Annual/Subsequent preventive examination.  Preventive Screening-Counseling & Management  Tobacco History  Smoking status  . Former Smoker  . Quit date: 09/17/1971  Smokeless tobacco  . Never Used     Problems Prior to Visit 1. GERD/reflux-she was placed on pantoprazole by her previous physician in [place]. She would like to fill that instead of the Nexium.  2. She also complains of a headache that she's had for well over a week. She said originally she was using Flonase as needed help her headaches. Says it doesn't work. But she has not been using it consistently. She says the headache radiates into the back of her head she still thinks it's possibly allergy related.  3. Says went for her colonoscopy and did the clean out and they doctor told her they didn't have enough infor on her to do it so they didn't do the procedure. Will call to find out what happened.   4. Due for CT of chest in august to f/u on lesion seen. Seh wants to get is done early.    Current Problems (verified) Patient Active Problem List   Diagnosis Date Noted  . Lung nodule < 6cm on CT 05/20/2014  . Obesity (BMI 30.0-34.9) 08/26/2013  . Chronic fatigue 02/18/2013  . DEPRESSION 01/08/2008  . FATIGUE 01/08/2008  . Hypothyroidism 12/04/2007  . HYPONATREMIA 12/04/2007  . ABNORMAL MAMMOGRAM 08/25/2007  . HYPERCHOLESTEROLEMIA 06/24/2006  . PERNICIOUS ANEMIA 06/24/2006  . HYPERTENSION, BENIGN SYSTEMIC 06/24/2006  . LOW BACK PAIN 06/24/2006  . TREMOR 06/24/2006    Medications Prior to Visit Current Outpatient Prescriptions on File Prior to Visit  Medication Sig Dispense Refill  . acetic acid-hydrocortisone (VOSOL-HC) otic solution Place 3 drops into both ears 3 (three) times daily. 10 mL 1  . Betamethasone Valerate 0.12 % foam Apply once day to the scalp prn 100 g 1  . Cholecalciferol (VITAMIN D3) 1000 UNITS CAPS Take 1 capsule  by mouth daily.      . clonazePAM (KLONOPIN) 1 MG tablet Take 1 tablet (1 mg total) by mouth 2 (two) times daily. 60 tablet 0  . cyanocobalamin (,VITAMIN B-12,) 1000 MCG/ML injection 0.5 mL into the the muscle every 14 days. 3 mL 0  . ketoconazole (NIZORAL) 2 % shampoo Apply 1 application topically 2 (two) times a week. 120 mL 2  . QVAR 40 MCG/ACT inhaler USE 2 INHALATIONS TWICE A DAY 3 g 3  . traMADol (ULTRAM) 50 MG tablet Take 1 tablet (50 mg total) by mouth every 6 (six) hours as needed. 90 tablet 1  . triamcinolone cream (KENALOG) 0.1 % Apply topically 2 (two) times daily. 85.2 g 0  . triamterene-hydrochlorothiazide (DYAZIDE) 37.5-25 MG per capsule Take 1 each (1 capsule total) by mouth daily. 90 capsule 0   No current facility-administered medications on file prior to visit.    Current Medications (verified) Current Outpatient Prescriptions  Medication Sig Dispense Refill  . acetic acid-hydrocortisone (VOSOL-HC) otic solution Place 3 drops into both ears 3 (three) times daily. 10 mL 1  . benzonatate (TESSALON) 100 MG capsule Take 1-2 capsules (100-200 mg total) by mouth 3 (three) times daily as needed for cough. 60 capsule 5  . Betamethasone Valerate 0.12 % foam Apply once day to the scalp prn 100 g 1  . Cholecalciferol (VITAMIN D3) 1000 UNITS CAPS Take 1 capsule by mouth daily.      . clonazePAM (KLONOPIN) 1 MG  tablet Take 1 tablet (1 mg total) by mouth 2 (two) times daily. 60 tablet 0  . cyanocobalamin (,VITAMIN B-12,) 1000 MCG/ML injection 0.5 mL into the the muscle every 14 days. 3 mL 0  . estradiol cypionate (DEPO-ESTRADIOL) 5 MG/ML injection Inject 1 mL (5 mg total) into the muscle every 28 (twenty-eight) days. 5 mL 5  . fluticasone (FLONASE) 50 MCG/ACT nasal spray Place 2 sprays into both nostrils daily. 16 g 11  . ketoconazole (NIZORAL) 2 % shampoo Apply 1 application topically 2 (two) times a week. 120 mL 2  . levothyroxine (SYNTHROID, LEVOTHROID) 50 MCG tablet Take 1 tablet by  mouth every day 30 tablet 11  . lisinopril (PRINIVIL,ZESTRIL) 20 MG tablet Take 1 tablet (20 mg total) by mouth daily. 90 tablet 1  . metoprolol tartrate (LOPRESSOR) 25 MG tablet Take 2 tablets (50 mg total) by mouth daily. 180 tablet 1  . QVAR 40 MCG/ACT inhaler USE 2 INHALATIONS TWICE A DAY 3 g 3  . ranitidine (ZANTAC) 150 MG capsule TAKE 1 CAPSULE TWICE A DAY AS NEEDED 180 capsule 1  . simvastatin (ZOCOR) 40 MG tablet Take 1 tablet (40 mg total) by mouth at bedtime. 90 tablet 3  . traMADol (ULTRAM) 50 MG tablet Take 1 tablet (50 mg total) by mouth every 6 (six) hours as needed. 90 tablet 1  . triamcinolone cream (KENALOG) 0.1 % Apply topically 2 (two) times daily. 85.2 g 0  . triamterene-hydrochlorothiazide (DYAZIDE) 37.5-25 MG per capsule Take 1 each (1 capsule total) by mouth daily. 90 capsule 0   No current facility-administered medications for this visit.     Allergies (verified) Codeine; Erythromycin; Morphine and related; Penicillamine; Sulfonamide derivatives; Tramadol; and Vistaril   PAST HISTORY  Family History Family History  Problem Relation Age of Onset  . Heart attack Father     Social History History  Substance Use Topics  . Smoking status: Former Smoker    Quit date: 09/17/1971  . Smokeless tobacco: Never Used  . Alcohol Use: No     Are there smokers in your home (other than you)? No  Risk Factors Current exercise habits: The patient does not participate in regular exercise at present.  Dietary issues discussed: none  Cardiac risk factors: advanced age (older than 60 for men, 59 for women) and hypertension.  Depression Screen (Note: if answer to either of the following is "Yes", a more complete depression screening is indicated)   Over the past two weeks, have you felt down, depressed or hopeless? No  Over the past two weeks, have you felt little interest or pleasure in doing things? No  Have you lost interest or pleasure in daily life? No  Do you often  feel hopeless? No  Do you cry easily over simple problems? No  Activities of Daily Living In your present state of health, do you have any difficulty performing the following activities?:  Driving? Yes Managing money?  Yes Feeding yourself? No Getting from bed to chair? No  Climbing a flight of stairs? No Preparing food and eating?: No Bathing or showering? No Getting dressed: No Getting to the toilet? No Using the toilet:No Moving around from place to place: No In the past year have you fallen or had a near fall?:No   Are you sexually active?  No  Do you have more than one partner?  No  Hearing Difficulties: No Do you often ask people to speak up or repeat themselves? No Do you experience ringing or  noises in your ears? No Do you have difficulty understanding soft or whispered voices? No   Do you feel that you have a problem with memory? No  Do you often misplace items? No  Do you feel safe at home?  Yes  Cognitive Testing  Alert? Yes  Normal Appearance?Yes  Oriented to person? Yes  Place? Yes   Time? Yes  Recall of three objects?  Yes  Can perform simple calculations? Yes  Displays appropriate judgment?Yes  Can read the correct time from a watch face?Yes   6 CIT score of 6/28 ( normal)    Advanced Directives have been discussed with the patient? Not asked.   List the Names of Other Physician/Practitioners you currently use: 1.    Indicate any recent Medical Services you may have received from other than [facility] providers in the past year (date may be approximate).  Immunization History  Administered Date(s) Administered  . Pneumococcal Polysaccharide-23 10/16/2009  . Td 10/02/2009    Screening Tests Health Maintenance  Topic Date Due  . PNA vac Low Risk Adult (2 of 2 - PCV13) 10/16/2010  . INFLUENZA VACCINE  04/17/2015  . MAMMOGRAM  05/31/2016  . TETANUS/TDAP  10/03/2019  . COLONOSCOPY  12/08/2021  . DEXA SCAN  Completed  . ZOSTAVAX  Addressed    All  answers were reviewed with the patient and necessary referrals were made:  Praise Stennett, MD   01/12/2015   History reviewed: allergies, current medications, past family history, past medical history, past social history, past surgical history and problem list  Review of Systems A comprehensive review of systems was negative.    Objective:     Vision by Snellen chart: Patient reports is UTD.   Body mass index is 33.65 kg/(m^2). BP 152/82 mmHg  Pulse 85  Temp(Src) 98 F (36.7 C) (Oral)  Ht 5\' 1"  (1.549 m)  Wt 178 lb (80.74 kg)  BMI 33.65 kg/m2  SpO2 92%  BP 152/82 mmHg  Pulse 85  Temp(Src) 98 F (36.7 C) (Oral)  Ht 5\' 1"  (1.549 m)  Wt 178 lb (80.74 kg)  BMI 33.65 kg/m2  SpO2 92% General appearance: alert, cooperative and appears stated age Head: Normocephalic, without obvious abnormality, atraumatic Eyes: conj clear, EOMi, PEERLA Ears: normal TM's and external ear canals both ears Nose: Nares normal. Septum midline. Mucosa normal. No drainage or sinus tenderness. Throat: lips, mucosa, and tongue normal; teeth and gums normal Neck: no adenopathy, no carotid bruit, no JVD, supple, symmetrical, trachea midline and thyroid not enlarged, symmetric, no tenderness/mass/nodules Back: symmetric, no curvature. ROM normal. No CVA tenderness. Lungs: clear to auscultation bilaterally Heart: regular rate and rhythm, S1, S2 normal, no murmur, click, rub or gallop Abdomen: soft, non-tender; bowel sounds normal; no masses,  no organomegaly Extremities: extremities normal, atraumatic, no cyanosis or edema Pulses: 2+ and symmetric Skin: Skin color, texture, turgor normal. No rashes or lesions Lymph nodes: Cervical, supraclavicular, and axillary nodes normal. Neurologic: Alert and oriented X 3, normal strength and tone. Normal symmetric reflexes. Normal coordination and gait     Assessment:     Annual Medicare Wellness Exam       Plan:     During the course of the visit the  patient was educated and counseled about appropriate screening and preventive services including:    Pneumococcal vaccine    Headache- offered toradol. She declined.  Recommend restart her flonase.    Mammogram is UTD  We did call to get blood work that she  had done about a week ago. It was a CMP, thyroid and ferritin. The glucose was mildly elevated at 120 that she was not fasting. We did a hemoglobin A1c here in the office for further evaluation. A1c was 6.0 which is in the impaired fasting glucose range. Work on diet, weight loss and exercise and we will recheck in 6 months. Thyroid level was normal. Ferritin looked great.  Sodium and chloride were low. Will call patient and have her decrease her diuretic to every other day. And then recheck labs in 2 weeks.  Says went for her colonoscopy and did the clean out and they doctor told her they didn't have enough infor on her to do it so they didn't do the procedure. Will call to find out what happened.    B12 def- it was ordered with her labs it wasn't drawn.  Will refill B12 but will need to recheck soon.  Due to screen lipids     Lung nodule < 6cm on CT - Hali Marry, MD at 05/20/2014 3:43 PM     Status: Written Related Problem: Lung nodule < 6cm on CT   Expand All Collapse All   32mm seen on CT done in [facility] 03/2014. Recommend repeat CT in 12 mo         Diet review for nutrition referral? Yes ____  Not Indicated _X__   Patient Instructions (the written plan) was given to the patient.  Medicare Attestation I have personally reviewed: The patient's medical and social history Their use of alcohol, tobacco or illicit drugs Their current medications and supplements The patient's functional ability including ADLs,fall risks, home safety risks, cognitive, and hearing and visual impairment Diet and physical activities Evidence for depression or mood disorders  The patient's weight, height, BMI, and visual acuity  have been recorded in the chart.  I have made referrals, counseling, and provided education to the patient based on review of the above and I have provided the patient with a written personalized care plan for preventive services.     Madeline Bebout, MD   01/12/2015

## 2015-01-12 NOTE — Telephone Encounter (Signed)
Order for bmp faxed to [facility] for pt to have drawn in 2 wks.Kathleen Dunn [place]

## 2015-01-24 ENCOUNTER — Other Ambulatory Visit: Payer: Medicare Other

## 2015-01-26 ENCOUNTER — Ambulatory Visit (INDEPENDENT_AMBULATORY_CARE_PROVIDER_SITE_OTHER): Payer: Medicare Other

## 2015-01-26 DIAGNOSIS — IMO0001 Reserved for inherently not codable concepts without codable children: Secondary | ICD-10-CM

## 2015-01-26 DIAGNOSIS — R918 Other nonspecific abnormal finding of lung field: Secondary | ICD-10-CM | POA: Diagnosis not present

## 2015-01-26 DIAGNOSIS — R911 Solitary pulmonary nodule: Secondary | ICD-10-CM

## 2015-01-26 DIAGNOSIS — J8410 Pulmonary fibrosis, unspecified: Secondary | ICD-10-CM | POA: Diagnosis not present

## 2015-01-27 ENCOUNTER — Other Ambulatory Visit: Payer: Self-pay | Admitting: Family Medicine

## 2015-01-27 ENCOUNTER — Telehealth: Payer: Self-pay | Admitting: *Deleted

## 2015-01-27 NOTE — Telephone Encounter (Signed)
Pt called back to discuss the results of her CT. I went over her results with her and advised her to continue taking the zocor. Pt reports that she has not been taking the zocor she was given samples of Livalo and states that she was given this a while ago and wanted to know does this do the same as the zocor. She also wanted dr. Madilyn Fireman to know that since she has had so many gi issues she is skeptical about what she takes. She said that she would finish the livalo and start on the zocor. I advised that should she begin to get any stomach upset from the zocor to please call back. Pt voiced understanding and agreed.Audelia Hives [place]

## 2015-02-14 DIAGNOSIS — E780 Pure hypercholesterolemia: Secondary | ICD-10-CM | POA: Diagnosis not present

## 2015-02-14 DIAGNOSIS — E039 Hypothyroidism, unspecified: Secondary | ICD-10-CM | POA: Diagnosis not present

## 2015-02-14 DIAGNOSIS — D510 Vitamin B12 deficiency anemia due to intrinsic factor deficiency: Secondary | ICD-10-CM | POA: Diagnosis not present

## 2015-02-15 ENCOUNTER — Other Ambulatory Visit: Payer: Self-pay | Admitting: Family Medicine

## 2015-02-15 LAB — LIPID PANEL
CHOLESTEROL: 198 mg/dL (ref 0–200)
HDL: 34 mg/dL — AB (ref >=46)
LDL Cholesterol: 105 mg/dL — ABNORMAL HIGH (ref 0–99)
TRIGLYCERIDES: 293 mg/dL — AB (ref <150)
Total CHOL/HDL Ratio: 5.8 Ratio
VLDL: 59 mg/dL — AB (ref 0–40)

## 2015-02-15 LAB — TSH: TSH: 2.429 u[IU]/mL (ref 0.350–4.500)

## 2015-02-15 LAB — VITAMIN B12: Vitamin B-12: >2000 pg/mL — ABNORMAL HIGH (ref 211–911)

## 2015-02-16 ENCOUNTER — Other Ambulatory Visit: Payer: Self-pay

## 2015-02-16 MED ORDER — CLONAZEPAM 1 MG PO TABS: 1.0000 mg | ORAL_TABLET | Freq: Two times a day (BID) | ORAL | Status: DC

## 2015-02-17 ENCOUNTER — Encounter: Payer: Self-pay | Admitting: Family Medicine

## 2015-02-17 ENCOUNTER — Ambulatory Visit (INDEPENDENT_AMBULATORY_CARE_PROVIDER_SITE_OTHER): Payer: Medicare Other | Admitting: Family Medicine

## 2015-02-17 VITALS — BP 143/78 | HR 79 | Ht 61.0 in | Wt 180.0 lb

## 2015-02-17 DIAGNOSIS — R7989 Other specified abnormal findings of blood chemistry: Secondary | ICD-10-CM | POA: Diagnosis not present

## 2015-02-17 DIAGNOSIS — I2510 Atherosclerotic heart disease of native coronary artery without angina pectoris: Secondary | ICD-10-CM

## 2015-02-17 DIAGNOSIS — IMO0001 Reserved for inherently not codable concepts without codable children: Secondary | ICD-10-CM

## 2015-02-17 DIAGNOSIS — R7301 Impaired fasting glucose: Secondary | ICD-10-CM | POA: Diagnosis not present

## 2015-02-17 DIAGNOSIS — R0602 Shortness of breath: Secondary | ICD-10-CM | POA: Diagnosis not present

## 2015-02-17 DIAGNOSIS — R911 Solitary pulmonary nodule: Secondary | ICD-10-CM

## 2015-02-17 DIAGNOSIS — Z87891 Personal history of nicotine dependence: Secondary | ICD-10-CM

## 2015-02-17 DIAGNOSIS — I2584 Coronary atherosclerosis due to calcified coronary lesion: Secondary | ICD-10-CM

## 2015-02-17 MED ORDER — ALBUTEROL SULFATE (2.5 MG/3ML) 0.083% IN NEBU: 2.5000 mg | INHALATION_SOLUTION | Freq: Once | RESPIRATORY_TRACT | Status: DC

## 2015-02-17 MED ORDER — PITAVASTATIN CALCIUM 2 MG PO TABS: 2.0000 mg | ORAL_TABLET | Freq: Every day | ORAL | Status: DC

## 2015-02-17 NOTE — Progress Notes (Signed)
   Subjective:    Patient ID: Kathleen Dunn, female    DOB: 08-07-1941, 74 y.o.   MRN: [id number]  HPI Here for spirometry today. Patient complains of occasional shortness of breath. She's also noted to have some nodules on chest CT which we have been following. She is due to repeat this in August. She is a former smoker. She says she smoked for about 9 years total.  She's also requesting a refill on her tramadol for joint pain. Was last filled it about 2 years ago. Her joints have been bothering her more recently.  She called the office yesterday about this. Hx of low back pain.  Says her neck and wrist bother her as well.  Occ her right sciatic nerve bothers her when she rides in other people's cars.    Coronary artery calcification - She wants to retry the livalo.  Took samples for about 3 weeks a year ago and did well.  Has had intolerance to simvastatin and lipitor    High B12 - we had called t tell her stop B12.  She c/o low energy. She is hesitant to stop it bc of her low energy and she feels like ti does help her    Review of Systems     Objective:   Physical Exam  Constitutional: She is oriented to person, place, and time. She appears well-developed and well-nourished.  HENT:  Head: Normocephalic and atraumatic.  Eyes: Conjunctivae and EOM are normal.  Cardiovascular: Normal rate.   Pulmonary/Chest: Effort normal.  Neurological: She is alert and oriented to person, place, and time.  Skin: Skin is dry. No pallor.  Psychiatric: She has a normal mood and affect. Her behavior is normal.          Assessment & Plan:  Shortness of breath-spirometry today shows FVC of 74% and FEV1 of 80% with a ratio of 83%. She did not want to take the albuterol so we did not do a post nebulizer reading. This would put her into the normal category with her spirometry though with somewhat decreased FVC she may have some early restriction going on.  Recurrent low back pain - Will refill the  tramadol.  Last time she has for refill was about 2 years ago. Lorenz Coaster think she'll be wheezing sparingly. If pain persists then we'll consider further workup and possible physical therapy referral.  Coronary artery calcification - will start livalo. New Rx sent since tolrerate this well.   B12 - Stop B12. Will recheck B12 in a few weeks. Can call before goes to the lab.    Pulm nodule - due to repeat the scan in July.    IFG - due for repeat A1C in October.    Fatigue - unclear etiology.  Stop B12

## 2015-02-23 ENCOUNTER — Other Ambulatory Visit: Payer: Self-pay | Admitting: Family Medicine

## 2015-02-23 MED ORDER — PITAVASTATIN CALCIUM 2 MG PO TABS: 2.0000 mg | ORAL_TABLET | Freq: Every day | ORAL | Status: DC

## 2015-02-23 NOTE — Telephone Encounter (Signed)
Pharmacy requested 90 day supply.

## 2015-03-07 NOTE — Addendum Note (Signed)
Addended by: Darla Lesches T on: 03/07/2015 08:38 AM   Modules accepted: Orders

## 2015-03-24 ENCOUNTER — Other Ambulatory Visit: Payer: Self-pay | Admitting: Family Medicine

## 2015-03-27 ENCOUNTER — Other Ambulatory Visit: Payer: Self-pay | Admitting: Family Medicine

## 2015-03-27 DIAGNOSIS — R5382 Chronic fatigue, unspecified: Secondary | ICD-10-CM

## 2015-03-27 DIAGNOSIS — D510 Vitamin B12 deficiency anemia due to intrinsic factor deficiency: Secondary | ICD-10-CM

## 2015-03-27 DIAGNOSIS — I10 Essential (primary) hypertension: Secondary | ICD-10-CM

## 2015-04-27 ENCOUNTER — Other Ambulatory Visit: Payer: Self-pay | Admitting: Family Medicine

## 2015-04-27 DIAGNOSIS — D510 Vitamin B12 deficiency anemia due to intrinsic factor deficiency: Secondary | ICD-10-CM

## 2015-04-27 DIAGNOSIS — E559 Vitamin D deficiency, unspecified: Secondary | ICD-10-CM

## 2015-04-27 DIAGNOSIS — I10 Essential (primary) hypertension: Secondary | ICD-10-CM

## 2015-05-11 ENCOUNTER — Other Ambulatory Visit: Payer: Self-pay | Admitting: Family Medicine

## 2015-05-11 NOTE — Telephone Encounter (Signed)
Please call the pharmacy back. I'm wondering if this is actually a medication her. We have never prescribed Flovent for her but we have prescribed fluticasone nasal spray which is Flonase. I'm wondering if they may have mixed up the nasal spray versus the inhaler.

## 2015-05-11 NOTE — Telephone Encounter (Signed)
Called and left voicemail for Pt questioning if this Rx for Flovent HFA was correct. Provided callback information.

## 2015-05-19 ENCOUNTER — Other Ambulatory Visit: Payer: Self-pay | Admitting: Family Medicine

## 2015-06-06 ENCOUNTER — Other Ambulatory Visit: Payer: Self-pay | Admitting: Family Medicine

## 2015-07-11 ENCOUNTER — Other Ambulatory Visit: Payer: Self-pay | Admitting: Family Medicine

## 2015-07-14 ENCOUNTER — Telehealth: Payer: Self-pay

## 2015-07-14 MED ORDER — CLONAZEPAM 1 MG PO TABS: 1.0000 mg | ORAL_TABLET | Freq: Two times a day (BID) | ORAL | Status: DC

## 2015-07-14 NOTE — Telephone Encounter (Signed)
Patient left a message for a refill on clonazepam. She will need to schedule an appointment before refills.

## 2015-07-14 NOTE — Telephone Encounter (Signed)
Forwarding to PCP.

## 2015-07-14 NOTE — Telephone Encounter (Signed)
Patient states she would like a [facility] Please advise.

## 2015-07-17 ENCOUNTER — Other Ambulatory Visit: Payer: Self-pay | Admitting: Family Medicine

## 2015-07-17 NOTE — Telephone Encounter (Signed)
Ok to sent 30 day as she is due for appt and then we can refill for 90 day when she is here.

## 2015-07-17 NOTE — Telephone Encounter (Signed)
Left message advising of recommendations.  

## 2015-07-21 ENCOUNTER — Telehealth: Payer: Self-pay | Admitting: *Deleted

## 2015-07-21 MED ORDER — CLONAZEPAM 1 MG PO TABS: 1.0000 mg | ORAL_TABLET | Freq: Two times a day (BID) | ORAL | Status: DC

## 2015-07-21 NOTE — Telephone Encounter (Signed)
Pt called stating that she has not received her clonazepam from [facility] yet, it was shipped out on 10/31.  VO from Dr. Madilyn Fireman to send #14 tabs to local pharm.

## 2015-08-01 DIAGNOSIS — D510 Vitamin B12 deficiency anemia due to intrinsic factor deficiency: Secondary | ICD-10-CM | POA: Diagnosis not present

## 2015-08-01 DIAGNOSIS — E559 Vitamin D deficiency, unspecified: Secondary | ICD-10-CM | POA: Diagnosis not present

## 2015-08-01 DIAGNOSIS — I10 Essential (primary) hypertension: Secondary | ICD-10-CM | POA: Diagnosis not present

## 2015-08-02 DIAGNOSIS — R799 Abnormal finding of blood chemistry, unspecified: Secondary | ICD-10-CM | POA: Diagnosis not present

## 2015-08-02 DIAGNOSIS — D510 Vitamin B12 deficiency anemia due to intrinsic factor deficiency: Secondary | ICD-10-CM | POA: Diagnosis not present

## 2015-08-02 DIAGNOSIS — E039 Hypothyroidism, unspecified: Secondary | ICD-10-CM | POA: Diagnosis not present

## 2015-08-02 DIAGNOSIS — E569 Vitamin deficiency, unspecified: Secondary | ICD-10-CM | POA: Diagnosis not present

## 2015-08-02 DIAGNOSIS — R5382 Chronic fatigue, unspecified: Secondary | ICD-10-CM | POA: Diagnosis not present

## 2015-08-02 DIAGNOSIS — E28310 Symptomatic premature menopause: Secondary | ICD-10-CM | POA: Diagnosis not present

## 2015-08-02 DIAGNOSIS — E559 Vitamin D deficiency, unspecified: Secondary | ICD-10-CM | POA: Diagnosis not present

## 2015-08-02 LAB — VITAMIN B12: Vitamin B-12: 999 pg/mL — ABNORMAL HIGH (ref 211–911)

## 2015-08-02 LAB — COMPLETE METABOLIC PANEL WITH GFR
ALT: 12 U/L (ref 6–29)
AST: 14 U/L (ref 10–35)
Albumin: 3.9 g/dL (ref 3.6–5.1)
Alkaline Phosphatase: 64 U/L (ref 33–130)
BILIRUBIN TOTAL: 0.6 mg/dL (ref 0.2–1.2)
BUN: 15 mg/dL (ref 7–25)
CO2: 28 mmol/L (ref 20–31)
Calcium: 9.5 mg/dL (ref 8.6–10.4)
Chloride: 91 mmol/L — ABNORMAL LOW (ref 98–110)
Creat: 0.84 mg/dL (ref 0.60–0.93)
GFR, Est African American: 80 mL/min (ref >=60)
GFR, Est Non African American: 70 mL/min (ref >=60)
GLUCOSE: 85 mg/dL (ref 65–99)
POTASSIUM: 4.7 mmol/L (ref 3.5–5.3)
SODIUM: 132 mmol/L — AB (ref 135–146)
TOTAL PROTEIN: 7 g/dL (ref 6.1–8.1)

## 2015-08-02 LAB — VITAMIN D 25 HYDROXY (VIT D DEFICIENCY, FRACTURES): Vit D, 25-Hydroxy: 34 ng/mL (ref 30–100)

## 2015-08-25 ENCOUNTER — Other Ambulatory Visit: Payer: Self-pay | Admitting: Family Medicine

## 2015-08-31 ENCOUNTER — Ambulatory Visit: Payer: Medicare Other | Admitting: Osteopathic Medicine

## 2015-09-15 ENCOUNTER — Other Ambulatory Visit: Payer: Self-pay

## 2015-09-15 MED ORDER — METOPROLOL TARTRATE 25 MG PO TABS: 25.0000 mg | ORAL_TABLET | Freq: Two times a day (BID) | ORAL | Status: DC

## 2015-09-15 NOTE — Telephone Encounter (Signed)
Patient adv she spoke with Levada Dy and Levada Dy needed the address to [employer] in [place], [place]. Address [address] [place] [address] [phone] she is out of town and need Clonazepam and one of her heart medicines called to that pharmacy. If any question pt said please call.

## 2015-09-15 NOTE — Telephone Encounter (Signed)
I did send a refill for the metoprolol but I do not think that they will honor the clonazepam. We can certainly call the pharmacy in [place] and see if they would fill a limited supply.

## 2015-09-22 ENCOUNTER — Other Ambulatory Visit: Payer: Self-pay

## 2015-09-22 MED ORDER — CLONAZEPAM 1 MG PO TABS: 1.0000 mg | ORAL_TABLET | Freq: Two times a day (BID) | ORAL | Status: DC | PRN

## 2015-10-04 ENCOUNTER — Telehealth: Payer: Self-pay

## 2015-10-04 MED ORDER — CLONAZEPAM 1 MG PO TABS: 1.0000 mg | ORAL_TABLET | Freq: Two times a day (BID) | ORAL | Status: DC | PRN

## 2015-10-04 NOTE — Telephone Encounter (Signed)
Zanyia advised to come in for a follow up visit before more refills.

## 2015-10-06 ENCOUNTER — Ambulatory Visit: Payer: Medicare Other | Admitting: Family Medicine

## 2015-10-08 ENCOUNTER — Other Ambulatory Visit: Payer: Self-pay | Admitting: Family Medicine

## 2015-10-09 ENCOUNTER — Other Ambulatory Visit: Payer: Self-pay | Admitting: Family Medicine

## 2015-11-03 ENCOUNTER — Encounter: Payer: Self-pay | Admitting: Family Medicine

## 2015-11-03 ENCOUNTER — Ambulatory Visit (INDEPENDENT_AMBULATORY_CARE_PROVIDER_SITE_OTHER): Payer: Medicare Other | Admitting: Family Medicine

## 2015-11-03 VITALS — BP 137/70 | HR 81 | Wt 182.0 lb

## 2015-11-03 DIAGNOSIS — K219 Gastro-esophageal reflux disease without esophagitis: Secondary | ICD-10-CM

## 2015-11-03 DIAGNOSIS — I10 Essential (primary) hypertension: Secondary | ICD-10-CM | POA: Diagnosis not present

## 2015-11-03 DIAGNOSIS — E039 Hypothyroidism, unspecified: Secondary | ICD-10-CM | POA: Diagnosis not present

## 2015-11-03 DIAGNOSIS — R7301 Impaired fasting glucose: Secondary | ICD-10-CM | POA: Diagnosis not present

## 2015-11-03 DIAGNOSIS — F411 Generalized anxiety disorder: Secondary | ICD-10-CM

## 2015-11-03 DIAGNOSIS — E7800 Pure hypercholesterolemia, unspecified: Secondary | ICD-10-CM

## 2015-11-03 LAB — POCT GLYCOSYLATED HEMOGLOBIN (HGB A1C): HEMOGLOBIN A1C: 6

## 2015-11-03 MED ORDER — RANITIDINE HCL 150 MG PO CAPS: ORAL_CAPSULE | ORAL | Status: DC

## 2015-11-03 MED ORDER — LOSARTAN POTASSIUM 25 MG PO TABS: 25.0000 mg | ORAL_TABLET | Freq: Every day | ORAL | Status: DC

## 2015-11-03 MED ORDER — TRIAMTERENE-HCTZ 37.5-25 MG PO CAPS: 1.0000 | ORAL_CAPSULE | Freq: Every day | ORAL | Status: DC

## 2015-11-03 MED ORDER — BETAMETHASONE VALERATE 0.12 % EX FOAM: CUTANEOUS | Status: DC

## 2015-11-03 MED ORDER — ESTRADIOL CYPIONATE 5 MG/ML IM OIL: 5.0000 mg | TOPICAL_OIL | INTRAMUSCULAR | Status: DC

## 2015-11-03 MED ORDER — VITAMIN D3 25 MCG (1000 UT) PO CAPS: 1.0000 | ORAL_CAPSULE | Freq: Every day | ORAL | Status: DC

## 2015-11-03 MED ORDER — PITAVASTATIN CALCIUM 2 MG PO TABS: 2.0000 mg | ORAL_TABLET | Freq: Every day | ORAL | Status: DC

## 2015-11-03 MED ORDER — CLONAZEPAM 1 MG PO TABS: 1.0000 mg | ORAL_TABLET | Freq: Two times a day (BID) | ORAL | Status: DC | PRN

## 2015-11-03 MED ORDER — KETOCONAZOLE 2 % EX SHAM: MEDICATED_SHAMPOO | CUTANEOUS | Status: DC

## 2015-11-03 MED ORDER — LEVOTHYROXINE SODIUM 50 MCG PO TABS: ORAL_TABLET | ORAL | Status: DC

## 2015-11-03 MED ORDER — METOPROLOL TARTRATE 25 MG PO TABS: 25.0000 mg | ORAL_TABLET | Freq: Every day | ORAL | Status: DC

## 2015-11-03 NOTE — Progress Notes (Signed)
   Subjective:    Patient ID: Kathleen Dunn, female    DOB: 04-04-1941, 75 y.o.   MRN: [id number]  HPI Hypothyroid- she denies any significant changes with skin or hair or energy levels. She's been taking an extra half a tab of levothyroxin every third day. She felt like it improved her mood and so that's what she's been doing.  Hypertension- Pt denies chest pain, SOB, dizziness, or heart palpitations.  Taking meds as directed w/o problems.  Denies medication side effects.    He has noticed a intermittent spasmodic type cough. She was unable to tell me how long it's actually been happening. She was hoping to get a refill on [employer]. She denies any recent upper respiratory infections. She denies any fevers or chills.  IFG  - no inc thirst or urination. Last A1c was 6.0. Lab Results  Component Value Date   HGBA1C 6.0 01/12/2015   Also been under a lot of stress recently. Her ex-husband of 36 years died in a plane crash. Says she's been going through a lot of the belongings and he also wrote her a lot of alimony. She says it's been very emotional for her one of her 3 children has been very supportive of the other to have not.  He is also taking some extra vitamin C as well as an immune support which primarily has magnesium minute. She has noticed more loose stools since starting it. That she admits she also feels more hungry. She is requesting refills on her alprazolam.  Review of Systems     Objective:   Physical Exam  Constitutional: She is oriented to person, place, and time. She appears well-developed and well-nourished.  HENT:  Head: Normocephalic and atraumatic.  Cardiovascular: Normal rate, regular rhythm and normal heart sounds.   Pulmonary/Chest: Effort normal and breath sounds normal.  Neurological: She is alert and oriented to person, place, and time.  Skin: Skin is warm and dry.  Psychiatric: She has a normal mood and affect. Her behavior is normal.           Assessment & Plan:  Hypothyroid - she feels like things are overall well controlled but she did adjust her dose on her own. Recommend recheck thyroid level.  IFG - will check A1C. stable. We'll continue to follow this every 6 months. Continue work on diet and exercise.  Cough-I suspect medication reaction. Cough on ACE- will change to losartan.    HTN-well-controlled. We'll switch her losinopril  to losartan. Recommend follow-up in 6 months. I would normally see her back sooner but she travels a lot.  Anxiety - refilled alprazolam. Return to use sparingly.

## 2015-11-06 ENCOUNTER — Encounter: Payer: Self-pay | Admitting: Family Medicine

## 2015-11-06 ENCOUNTER — Telehealth: Payer: Self-pay

## 2015-11-06 DIAGNOSIS — F411 Generalized anxiety disorder: Secondary | ICD-10-CM | POA: Insufficient documentation

## 2015-11-06 MED ORDER — CLONAZEPAM 1 MG PO TABS: 1.0000 mg | ORAL_TABLET | Freq: Two times a day (BID) | ORAL | Status: DC | PRN

## 2015-11-06 NOTE — Telephone Encounter (Signed)
90 day sent 

## 2015-11-06 NOTE — Telephone Encounter (Signed)
Laurren states Dr Madilyn Fireman said ok to a 90 day prescription of clonazepam. The prescription was written for 30 day supply. Please advise.

## 2015-11-06 NOTE — Telephone Encounter (Signed)
If she has Artie filled the prescription then we can refill for 90 day supply when she is due for refill. If she has not filled it and and we can confirm that with the pharmacy then she can send over 90 day supply and have them cancel the 30 day supply.

## 2015-11-24 ENCOUNTER — Telehealth: Payer: Self-pay

## 2015-11-24 NOTE — Telephone Encounter (Signed)
Kathleen Dunn to update her list. I would encourage her to try to take the simvastatin at least twice a week if possible.

## 2015-11-24 NOTE — Telephone Encounter (Signed)
Kathleen Dunn states she is not taking the Livalo. She states she is taking simvastatin 40 mg half a tablet once a week. She wants the Livalo taken off her medication list. She does not need a refill of the simvastatin. 20 minute conversation.

## 2015-11-27 ENCOUNTER — Telehealth: Payer: Self-pay

## 2015-11-27 NOTE — Telephone Encounter (Signed)
Left message to call for recommendation.

## 2015-11-27 NOTE — Telephone Encounter (Signed)
Pt called back and gave recommendation regarding medication.

## 2015-12-22 ENCOUNTER — Other Ambulatory Visit: Payer: Self-pay | Admitting: Family Medicine

## 2016-02-10 DIAGNOSIS — Z8639 Personal history of other endocrine, nutritional and metabolic disease: Secondary | ICD-10-CM | POA: Diagnosis not present

## 2016-02-10 DIAGNOSIS — R531 Weakness: Secondary | ICD-10-CM | POA: Diagnosis not present

## 2016-02-10 DIAGNOSIS — M25511 Pain in right shoulder: Secondary | ICD-10-CM | POA: Diagnosis not present

## 2016-02-10 DIAGNOSIS — G250 Essential tremor: Secondary | ICD-10-CM | POA: Diagnosis not present

## 2016-02-10 DIAGNOSIS — R079 Chest pain, unspecified: Secondary | ICD-10-CM | POA: Diagnosis not present

## 2016-02-10 DIAGNOSIS — R42 Dizziness and giddiness: Secondary | ICD-10-CM | POA: Diagnosis not present

## 2016-02-10 DIAGNOSIS — M791 Myalgia: Secondary | ICD-10-CM | POA: Diagnosis not present

## 2016-02-10 DIAGNOSIS — R296 Repeated falls: Secondary | ICD-10-CM | POA: Diagnosis not present

## 2016-02-10 DIAGNOSIS — R221 Localized swelling, mass and lump, neck: Secondary | ICD-10-CM | POA: Diagnosis not present

## 2016-02-10 DIAGNOSIS — M25561 Pain in right knee: Secondary | ICD-10-CM | POA: Diagnosis not present

## 2016-02-10 DIAGNOSIS — E860 Dehydration: Secondary | ICD-10-CM | POA: Diagnosis not present

## 2016-02-10 DIAGNOSIS — M542 Cervicalgia: Secondary | ICD-10-CM | POA: Diagnosis not present

## 2016-02-11 DIAGNOSIS — R42 Dizziness and giddiness: Secondary | ICD-10-CM | POA: Diagnosis not present

## 2016-02-11 DIAGNOSIS — R079 Chest pain, unspecified: Secondary | ICD-10-CM | POA: Diagnosis not present

## 2016-02-11 DIAGNOSIS — M542 Cervicalgia: Secondary | ICD-10-CM | POA: Diagnosis not present

## 2016-02-20 ENCOUNTER — Other Ambulatory Visit: Payer: Self-pay

## 2016-02-20 NOTE — Telephone Encounter (Signed)
Kathleen Dunn called and states she has chronic bilateral knee pain. She would like a refill on the Tramadol. She hasn't had it filled since 2014. Please advise. She would like it to be faxed to [facility] [address] in [facility], in chart.

## 2016-02-20 NOTE — Telephone Encounter (Signed)
Ok to refill but let her know they may not fill it out of state.

## 2016-02-21 MED ORDER — TRAMADOL HCL 50 MG PO TABS: 50.0000 mg | ORAL_TABLET | Freq: Two times a day (BID) | ORAL | Status: DC | PRN

## 2016-02-21 NOTE — Telephone Encounter (Signed)
Sent prescription

## 2016-02-22 ENCOUNTER — Telehealth: Payer: Self-pay | Admitting: *Deleted

## 2016-02-22 NOTE — Telephone Encounter (Signed)
Patient called wanting to know what type of doctor she can see for her knee pain, chiropractor vs ortho. Told pt ( since she was referred to an ortho doc back in 2013) that an ortho do would be best. The patient then asked if Dr. Madilyn Fireman could send her a referral to [place] where she is currently living. Advised the patient would need to establish care there if that is where she is going to be living and the PCP there could refer after evaluating her for knee pain. Patient voiced understanding.

## 2016-02-28 ENCOUNTER — Telehealth: Payer: Self-pay

## 2016-02-29 NOTE — Telephone Encounter (Signed)
Left message that Dr. Madilyn Fireman said she could try the Crazy water if she would like, and it is up to her if she wants to try #2, #3, or #4.

## 2016-03-09 ENCOUNTER — Other Ambulatory Visit: Payer: Self-pay | Admitting: Family Medicine

## 2016-03-13 ENCOUNTER — Other Ambulatory Visit: Payer: Self-pay | Admitting: Family Medicine

## 2016-03-29 ENCOUNTER — Telehealth: Payer: Self-pay

## 2016-03-29 DIAGNOSIS — K219 Gastro-esophageal reflux disease without esophagitis: Secondary | ICD-10-CM

## 2016-03-29 NOTE — Telephone Encounter (Signed)
Fronnie wants a refill on benzonatate. She states she doesn't need it all the time just every now and then. She would also like a 3 month refill of clonazepam. [facility] She will not be able to follow up next month. She will not be back from [place] until September or October.

## 2016-04-01 MED ORDER — CLONAZEPAM 1 MG PO TABS: 1.0000 mg | ORAL_TABLET | Freq: Two times a day (BID) | ORAL | Status: DC | PRN

## 2016-04-01 MED ORDER — BENZONATATE 100 MG PO CAPS: 100.0000 mg | ORAL_CAPSULE | Freq: Three times a day (TID) | ORAL | Status: DC | PRN

## 2016-04-01 NOTE — Telephone Encounter (Signed)
I refilled the benzonatate. I did refill her clonazepam but only for 90 tabs not 180 tabs. Technically her six-month follow-up would be in August. I am aware that she will not be able to make it until the fall but I'm not going to refill the benzodiazepine for another 180 day supply

## 2016-04-03 NOTE — Telephone Encounter (Signed)
Left a message for patient to call back to give the local pharmacy in [place]. Dr Madilyn Fireman did a 30 day refill for the clonazepam so it can not go to mail order. The prescription is on Kathleen Dunn's deck beside the fan.

## 2016-04-04 DIAGNOSIS — R42 Dizziness and giddiness: Secondary | ICD-10-CM | POA: Diagnosis not present

## 2016-04-04 DIAGNOSIS — Z880 Allergy status to penicillin: Secondary | ICD-10-CM | POA: Diagnosis not present

## 2016-04-04 DIAGNOSIS — R7303 Prediabetes: Secondary | ICD-10-CM | POA: Diagnosis not present

## 2016-04-04 DIAGNOSIS — R55 Syncope and collapse: Secondary | ICD-10-CM | POA: Diagnosis not present

## 2016-04-04 DIAGNOSIS — Z743 Need for continuous supervision: Secondary | ICD-10-CM | POA: Diagnosis not present

## 2016-04-04 DIAGNOSIS — E869 Volume depletion, unspecified: Secondary | ICD-10-CM | POA: Diagnosis not present

## 2016-04-04 DIAGNOSIS — R404 Transient alteration of awareness: Secondary | ICD-10-CM | POA: Diagnosis not present

## 2016-04-04 DIAGNOSIS — Z882 Allergy status to sulfonamides status: Secondary | ICD-10-CM | POA: Diagnosis not present

## 2016-04-04 DIAGNOSIS — Z79899 Other long term (current) drug therapy: Secondary | ICD-10-CM | POA: Diagnosis not present

## 2016-04-04 DIAGNOSIS — Z885 Allergy status to narcotic agent status: Secondary | ICD-10-CM | POA: Diagnosis not present

## 2016-04-04 DIAGNOSIS — R112 Nausea with vomiting, unspecified: Secondary | ICD-10-CM | POA: Diagnosis not present

## 2016-04-04 DIAGNOSIS — R531 Weakness: Secondary | ICD-10-CM | POA: Diagnosis not present

## 2016-04-04 DIAGNOSIS — Z883 Allergy status to other anti-infective agents status: Secondary | ICD-10-CM | POA: Diagnosis not present

## 2016-04-04 DIAGNOSIS — F419 Anxiety disorder, unspecified: Secondary | ICD-10-CM | POA: Diagnosis not present

## 2016-04-04 DIAGNOSIS — I10 Essential (primary) hypertension: Secondary | ICD-10-CM | POA: Diagnosis not present

## 2016-04-05 DIAGNOSIS — E876 Hypokalemia: Secondary | ICD-10-CM | POA: Insufficient documentation

## 2016-04-05 DIAGNOSIS — R42 Dizziness and giddiness: Secondary | ICD-10-CM | POA: Diagnosis not present

## 2016-04-05 DIAGNOSIS — R55 Syncope and collapse: Secondary | ICD-10-CM | POA: Diagnosis not present

## 2016-04-05 DIAGNOSIS — I10 Essential (primary) hypertension: Secondary | ICD-10-CM | POA: Diagnosis not present

## 2016-04-06 DIAGNOSIS — R55 Syncope and collapse: Secondary | ICD-10-CM | POA: Diagnosis not present

## 2016-04-06 DIAGNOSIS — E039 Hypothyroidism, unspecified: Secondary | ICD-10-CM | POA: Diagnosis not present

## 2016-04-06 DIAGNOSIS — I10 Essential (primary) hypertension: Secondary | ICD-10-CM | POA: Diagnosis not present

## 2016-04-07 DIAGNOSIS — E039 Hypothyroidism, unspecified: Secondary | ICD-10-CM | POA: Diagnosis not present

## 2016-04-07 DIAGNOSIS — I10 Essential (primary) hypertension: Secondary | ICD-10-CM | POA: Diagnosis not present

## 2016-04-07 DIAGNOSIS — R55 Syncope and collapse: Secondary | ICD-10-CM | POA: Diagnosis not present

## 2016-04-08 DIAGNOSIS — I340 Nonrheumatic mitral (valve) insufficiency: Secondary | ICD-10-CM | POA: Diagnosis not present

## 2016-04-08 DIAGNOSIS — I10 Essential (primary) hypertension: Secondary | ICD-10-CM | POA: Diagnosis not present

## 2016-04-08 DIAGNOSIS — I6523 Occlusion and stenosis of bilateral carotid arteries: Secondary | ICD-10-CM | POA: Diagnosis not present

## 2016-04-08 DIAGNOSIS — R55 Syncope and collapse: Secondary | ICD-10-CM | POA: Diagnosis not present

## 2016-04-11 DIAGNOSIS — G4700 Insomnia, unspecified: Secondary | ICD-10-CM | POA: Diagnosis not present

## 2016-04-11 DIAGNOSIS — F419 Anxiety disorder, unspecified: Secondary | ICD-10-CM | POA: Diagnosis not present

## 2016-04-11 DIAGNOSIS — R7303 Prediabetes: Secondary | ICD-10-CM | POA: Diagnosis not present

## 2016-04-11 DIAGNOSIS — M6281 Muscle weakness (generalized): Secondary | ICD-10-CM | POA: Diagnosis not present

## 2016-04-11 DIAGNOSIS — E039 Hypothyroidism, unspecified: Secondary | ICD-10-CM | POA: Diagnosis not present

## 2016-04-11 DIAGNOSIS — R55 Syncope and collapse: Secondary | ICD-10-CM | POA: Diagnosis not present

## 2016-04-11 DIAGNOSIS — R251 Tremor, unspecified: Secondary | ICD-10-CM | POA: Diagnosis not present

## 2016-04-11 DIAGNOSIS — I10 Essential (primary) hypertension: Secondary | ICD-10-CM | POA: Diagnosis not present

## 2016-04-11 DIAGNOSIS — J45909 Unspecified asthma, uncomplicated: Secondary | ICD-10-CM | POA: Diagnosis not present

## 2016-04-12 DIAGNOSIS — G4700 Insomnia, unspecified: Secondary | ICD-10-CM | POA: Diagnosis not present

## 2016-04-12 DIAGNOSIS — F419 Anxiety disorder, unspecified: Secondary | ICD-10-CM | POA: Diagnosis not present

## 2016-04-12 DIAGNOSIS — M6281 Muscle weakness (generalized): Secondary | ICD-10-CM | POA: Diagnosis not present

## 2016-04-12 DIAGNOSIS — R7303 Prediabetes: Secondary | ICD-10-CM | POA: Diagnosis not present

## 2016-04-12 DIAGNOSIS — I10 Essential (primary) hypertension: Secondary | ICD-10-CM | POA: Diagnosis not present

## 2016-04-12 DIAGNOSIS — R55 Syncope and collapse: Secondary | ICD-10-CM | POA: Diagnosis not present

## 2016-04-15 DIAGNOSIS — E669 Obesity, unspecified: Secondary | ICD-10-CM | POA: Diagnosis not present

## 2016-04-15 DIAGNOSIS — I10 Essential (primary) hypertension: Secondary | ICD-10-CM | POA: Diagnosis not present

## 2016-04-15 DIAGNOSIS — R55 Syncope and collapse: Secondary | ICD-10-CM | POA: Diagnosis not present

## 2016-04-15 DIAGNOSIS — M150 Primary generalized (osteo)arthritis: Secondary | ICD-10-CM | POA: Diagnosis not present

## 2016-04-16 DIAGNOSIS — M6281 Muscle weakness (generalized): Secondary | ICD-10-CM | POA: Diagnosis not present

## 2016-04-16 DIAGNOSIS — G4700 Insomnia, unspecified: Secondary | ICD-10-CM | POA: Diagnosis not present

## 2016-04-16 DIAGNOSIS — I10 Essential (primary) hypertension: Secondary | ICD-10-CM | POA: Diagnosis not present

## 2016-04-16 DIAGNOSIS — R7303 Prediabetes: Secondary | ICD-10-CM | POA: Diagnosis not present

## 2016-04-16 DIAGNOSIS — F419 Anxiety disorder, unspecified: Secondary | ICD-10-CM | POA: Diagnosis not present

## 2016-04-16 DIAGNOSIS — R55 Syncope and collapse: Secondary | ICD-10-CM | POA: Diagnosis not present

## 2016-04-17 DIAGNOSIS — G4700 Insomnia, unspecified: Secondary | ICD-10-CM | POA: Diagnosis not present

## 2016-04-17 DIAGNOSIS — F419 Anxiety disorder, unspecified: Secondary | ICD-10-CM | POA: Diagnosis not present

## 2016-04-17 DIAGNOSIS — R7303 Prediabetes: Secondary | ICD-10-CM | POA: Diagnosis not present

## 2016-04-17 DIAGNOSIS — I10 Essential (primary) hypertension: Secondary | ICD-10-CM | POA: Diagnosis not present

## 2016-04-17 DIAGNOSIS — R55 Syncope and collapse: Secondary | ICD-10-CM | POA: Diagnosis not present

## 2016-04-17 DIAGNOSIS — M6281 Muscle weakness (generalized): Secondary | ICD-10-CM | POA: Diagnosis not present

## 2016-04-23 DIAGNOSIS — M6281 Muscle weakness (generalized): Secondary | ICD-10-CM | POA: Diagnosis not present

## 2016-04-23 DIAGNOSIS — G4700 Insomnia, unspecified: Secondary | ICD-10-CM | POA: Diagnosis not present

## 2016-04-23 DIAGNOSIS — F419 Anxiety disorder, unspecified: Secondary | ICD-10-CM | POA: Diagnosis not present

## 2016-04-23 DIAGNOSIS — R55 Syncope and collapse: Secondary | ICD-10-CM | POA: Diagnosis not present

## 2016-04-23 DIAGNOSIS — R7303 Prediabetes: Secondary | ICD-10-CM | POA: Diagnosis not present

## 2016-04-23 DIAGNOSIS — I10 Essential (primary) hypertension: Secondary | ICD-10-CM | POA: Diagnosis not present

## 2016-04-25 DIAGNOSIS — F419 Anxiety disorder, unspecified: Secondary | ICD-10-CM | POA: Diagnosis not present

## 2016-04-25 DIAGNOSIS — M6281 Muscle weakness (generalized): Secondary | ICD-10-CM | POA: Diagnosis not present

## 2016-04-25 DIAGNOSIS — R7303 Prediabetes: Secondary | ICD-10-CM | POA: Diagnosis not present

## 2016-04-25 DIAGNOSIS — G4700 Insomnia, unspecified: Secondary | ICD-10-CM | POA: Diagnosis not present

## 2016-04-25 DIAGNOSIS — R55 Syncope and collapse: Secondary | ICD-10-CM | POA: Diagnosis not present

## 2016-04-25 DIAGNOSIS — I10 Essential (primary) hypertension: Secondary | ICD-10-CM | POA: Diagnosis not present

## 2016-04-26 DIAGNOSIS — N390 Urinary tract infection, site not specified: Secondary | ICD-10-CM | POA: Diagnosis not present

## 2016-04-26 DIAGNOSIS — F419 Anxiety disorder, unspecified: Secondary | ICD-10-CM | POA: Diagnosis not present

## 2016-04-26 DIAGNOSIS — I10 Essential (primary) hypertension: Secondary | ICD-10-CM | POA: Diagnosis not present

## 2016-04-26 DIAGNOSIS — G4700 Insomnia, unspecified: Secondary | ICD-10-CM | POA: Diagnosis not present

## 2016-04-26 DIAGNOSIS — R7303 Prediabetes: Secondary | ICD-10-CM | POA: Diagnosis not present

## 2016-04-26 DIAGNOSIS — R55 Syncope and collapse: Secondary | ICD-10-CM | POA: Diagnosis not present

## 2016-04-26 DIAGNOSIS — M6281 Muscle weakness (generalized): Secondary | ICD-10-CM | POA: Diagnosis not present

## 2016-04-30 DIAGNOSIS — M6281 Muscle weakness (generalized): Secondary | ICD-10-CM | POA: Diagnosis not present

## 2016-04-30 DIAGNOSIS — G4700 Insomnia, unspecified: Secondary | ICD-10-CM | POA: Diagnosis not present

## 2016-04-30 DIAGNOSIS — R7303 Prediabetes: Secondary | ICD-10-CM | POA: Diagnosis not present

## 2016-04-30 DIAGNOSIS — I10 Essential (primary) hypertension: Secondary | ICD-10-CM | POA: Diagnosis not present

## 2016-04-30 DIAGNOSIS — F419 Anxiety disorder, unspecified: Secondary | ICD-10-CM | POA: Diagnosis not present

## 2016-04-30 DIAGNOSIS — R55 Syncope and collapse: Secondary | ICD-10-CM | POA: Diagnosis not present

## 2016-05-01 DIAGNOSIS — I10 Essential (primary) hypertension: Secondary | ICD-10-CM | POA: Diagnosis not present

## 2016-05-01 DIAGNOSIS — G4700 Insomnia, unspecified: Secondary | ICD-10-CM | POA: Diagnosis not present

## 2016-05-01 DIAGNOSIS — R55 Syncope and collapse: Secondary | ICD-10-CM | POA: Diagnosis not present

## 2016-05-01 DIAGNOSIS — R7303 Prediabetes: Secondary | ICD-10-CM | POA: Diagnosis not present

## 2016-05-01 DIAGNOSIS — F419 Anxiety disorder, unspecified: Secondary | ICD-10-CM | POA: Diagnosis not present

## 2016-05-01 DIAGNOSIS — M6281 Muscle weakness (generalized): Secondary | ICD-10-CM | POA: Diagnosis not present

## 2016-05-03 DIAGNOSIS — I10 Essential (primary) hypertension: Secondary | ICD-10-CM | POA: Diagnosis not present

## 2016-05-03 DIAGNOSIS — E876 Hypokalemia: Secondary | ICD-10-CM | POA: Diagnosis not present

## 2016-05-03 DIAGNOSIS — R9431 Abnormal electrocardiogram [ECG] [EKG]: Secondary | ICD-10-CM | POA: Diagnosis not present

## 2016-05-03 DIAGNOSIS — R42 Dizziness and giddiness: Secondary | ICD-10-CM | POA: Diagnosis not present

## 2016-05-03 LAB — BASIC METABOLIC PANEL
BUN: 18 mg/dL (ref 4–21)
Creatinine: 0.8 mg/dL (ref 0.5–1.1)
GLUCOSE: 98 mg/dL
Potassium: 4.8 mmol/L (ref 3.4–5.3)
SODIUM: 137 mmol/L (ref 137–147)

## 2016-05-03 LAB — HEPATIC FUNCTION PANEL
ALT: 16 U/L (ref 7–35)
AST: 12 U/L — AB (ref 13–35)
Alkaline Phosphatase: 58 U/L (ref 25–125)
BILIRUBIN, TOTAL: 0.3 mg/dL

## 2016-05-06 DIAGNOSIS — M6281 Muscle weakness (generalized): Secondary | ICD-10-CM | POA: Diagnosis not present

## 2016-05-06 DIAGNOSIS — R7303 Prediabetes: Secondary | ICD-10-CM | POA: Diagnosis not present

## 2016-05-06 DIAGNOSIS — G4700 Insomnia, unspecified: Secondary | ICD-10-CM | POA: Diagnosis not present

## 2016-05-06 DIAGNOSIS — I10 Essential (primary) hypertension: Secondary | ICD-10-CM | POA: Diagnosis not present

## 2016-05-06 DIAGNOSIS — R55 Syncope and collapse: Secondary | ICD-10-CM | POA: Diagnosis not present

## 2016-05-06 DIAGNOSIS — F419 Anxiety disorder, unspecified: Secondary | ICD-10-CM | POA: Diagnosis not present

## 2016-05-07 DIAGNOSIS — I10 Essential (primary) hypertension: Secondary | ICD-10-CM | POA: Diagnosis not present

## 2016-05-07 DIAGNOSIS — M6281 Muscle weakness (generalized): Secondary | ICD-10-CM | POA: Diagnosis not present

## 2016-05-07 DIAGNOSIS — G4700 Insomnia, unspecified: Secondary | ICD-10-CM | POA: Diagnosis not present

## 2016-05-07 DIAGNOSIS — F419 Anxiety disorder, unspecified: Secondary | ICD-10-CM | POA: Diagnosis not present

## 2016-05-07 DIAGNOSIS — R7303 Prediabetes: Secondary | ICD-10-CM | POA: Diagnosis not present

## 2016-05-07 DIAGNOSIS — R55 Syncope and collapse: Secondary | ICD-10-CM | POA: Diagnosis not present

## 2016-05-08 DIAGNOSIS — G4700 Insomnia, unspecified: Secondary | ICD-10-CM | POA: Diagnosis not present

## 2016-05-08 DIAGNOSIS — E039 Hypothyroidism, unspecified: Secondary | ICD-10-CM | POA: Diagnosis not present

## 2016-05-08 DIAGNOSIS — R7303 Prediabetes: Secondary | ICD-10-CM | POA: Diagnosis not present

## 2016-05-08 DIAGNOSIS — I10 Essential (primary) hypertension: Secondary | ICD-10-CM | POA: Diagnosis not present

## 2016-05-08 DIAGNOSIS — M6281 Muscle weakness (generalized): Secondary | ICD-10-CM | POA: Diagnosis not present

## 2016-05-08 DIAGNOSIS — F419 Anxiety disorder, unspecified: Secondary | ICD-10-CM | POA: Diagnosis not present

## 2016-05-08 DIAGNOSIS — J45909 Unspecified asthma, uncomplicated: Secondary | ICD-10-CM | POA: Diagnosis not present

## 2016-05-08 DIAGNOSIS — R55 Syncope and collapse: Secondary | ICD-10-CM | POA: Diagnosis not present

## 2016-05-08 DIAGNOSIS — E785 Hyperlipidemia, unspecified: Secondary | ICD-10-CM | POA: Diagnosis not present

## 2016-05-10 ENCOUNTER — Encounter: Payer: Self-pay | Admitting: Family Medicine

## 2016-05-13 DIAGNOSIS — R55 Syncope and collapse: Secondary | ICD-10-CM | POA: Diagnosis not present

## 2016-05-13 DIAGNOSIS — R7303 Prediabetes: Secondary | ICD-10-CM | POA: Diagnosis not present

## 2016-05-13 DIAGNOSIS — G4700 Insomnia, unspecified: Secondary | ICD-10-CM | POA: Diagnosis not present

## 2016-05-13 DIAGNOSIS — F419 Anxiety disorder, unspecified: Secondary | ICD-10-CM | POA: Diagnosis not present

## 2016-05-13 DIAGNOSIS — M6281 Muscle weakness (generalized): Secondary | ICD-10-CM | POA: Diagnosis not present

## 2016-05-13 DIAGNOSIS — I10 Essential (primary) hypertension: Secondary | ICD-10-CM | POA: Diagnosis not present

## 2016-05-15 DIAGNOSIS — M6281 Muscle weakness (generalized): Secondary | ICD-10-CM | POA: Diagnosis not present

## 2016-05-15 DIAGNOSIS — I10 Essential (primary) hypertension: Secondary | ICD-10-CM | POA: Diagnosis not present

## 2016-05-15 DIAGNOSIS — F419 Anxiety disorder, unspecified: Secondary | ICD-10-CM | POA: Diagnosis not present

## 2016-05-15 DIAGNOSIS — R7303 Prediabetes: Secondary | ICD-10-CM | POA: Diagnosis not present

## 2016-05-15 DIAGNOSIS — R55 Syncope and collapse: Secondary | ICD-10-CM | POA: Diagnosis not present

## 2016-05-15 DIAGNOSIS — G4700 Insomnia, unspecified: Secondary | ICD-10-CM | POA: Diagnosis not present

## 2016-05-16 DIAGNOSIS — M6281 Muscle weakness (generalized): Secondary | ICD-10-CM | POA: Diagnosis not present

## 2016-05-16 DIAGNOSIS — I10 Essential (primary) hypertension: Secondary | ICD-10-CM | POA: Diagnosis not present

## 2016-05-16 DIAGNOSIS — R55 Syncope and collapse: Secondary | ICD-10-CM | POA: Diagnosis not present

## 2016-05-16 DIAGNOSIS — F419 Anxiety disorder, unspecified: Secondary | ICD-10-CM | POA: Diagnosis not present

## 2016-05-17 DIAGNOSIS — G4700 Insomnia, unspecified: Secondary | ICD-10-CM | POA: Diagnosis not present

## 2016-05-17 DIAGNOSIS — M6281 Muscle weakness (generalized): Secondary | ICD-10-CM | POA: Diagnosis not present

## 2016-05-17 DIAGNOSIS — R7303 Prediabetes: Secondary | ICD-10-CM | POA: Diagnosis not present

## 2016-05-17 DIAGNOSIS — R55 Syncope and collapse: Secondary | ICD-10-CM | POA: Diagnosis not present

## 2016-05-17 DIAGNOSIS — F419 Anxiety disorder, unspecified: Secondary | ICD-10-CM | POA: Diagnosis not present

## 2016-05-17 DIAGNOSIS — I10 Essential (primary) hypertension: Secondary | ICD-10-CM | POA: Diagnosis not present

## 2016-05-20 DIAGNOSIS — I10 Essential (primary) hypertension: Secondary | ICD-10-CM | POA: Diagnosis not present

## 2016-05-20 DIAGNOSIS — R55 Syncope and collapse: Secondary | ICD-10-CM | POA: Diagnosis not present

## 2016-05-20 DIAGNOSIS — M6281 Muscle weakness (generalized): Secondary | ICD-10-CM | POA: Diagnosis not present

## 2016-05-20 DIAGNOSIS — F419 Anxiety disorder, unspecified: Secondary | ICD-10-CM | POA: Diagnosis not present

## 2016-05-20 DIAGNOSIS — R7303 Prediabetes: Secondary | ICD-10-CM | POA: Diagnosis not present

## 2016-05-20 DIAGNOSIS — G4700 Insomnia, unspecified: Secondary | ICD-10-CM | POA: Diagnosis not present

## 2016-05-22 DIAGNOSIS — I10 Essential (primary) hypertension: Secondary | ICD-10-CM | POA: Diagnosis not present

## 2016-05-22 DIAGNOSIS — R7303 Prediabetes: Secondary | ICD-10-CM | POA: Diagnosis not present

## 2016-05-22 DIAGNOSIS — F419 Anxiety disorder, unspecified: Secondary | ICD-10-CM | POA: Diagnosis not present

## 2016-05-22 DIAGNOSIS — R55 Syncope and collapse: Secondary | ICD-10-CM | POA: Diagnosis not present

## 2016-05-22 DIAGNOSIS — G4700 Insomnia, unspecified: Secondary | ICD-10-CM | POA: Diagnosis not present

## 2016-05-22 DIAGNOSIS — M6281 Muscle weakness (generalized): Secondary | ICD-10-CM | POA: Diagnosis not present

## 2016-05-24 DIAGNOSIS — I10 Essential (primary) hypertension: Secondary | ICD-10-CM | POA: Diagnosis not present

## 2016-05-24 DIAGNOSIS — F419 Anxiety disorder, unspecified: Secondary | ICD-10-CM | POA: Diagnosis not present

## 2016-05-24 DIAGNOSIS — R55 Syncope and collapse: Secondary | ICD-10-CM | POA: Diagnosis not present

## 2016-05-24 DIAGNOSIS — M6281 Muscle weakness (generalized): Secondary | ICD-10-CM | POA: Diagnosis not present

## 2016-05-24 DIAGNOSIS — G4700 Insomnia, unspecified: Secondary | ICD-10-CM | POA: Diagnosis not present

## 2016-05-24 DIAGNOSIS — R7303 Prediabetes: Secondary | ICD-10-CM | POA: Diagnosis not present

## 2016-05-28 DIAGNOSIS — R55 Syncope and collapse: Secondary | ICD-10-CM | POA: Diagnosis not present

## 2016-05-28 DIAGNOSIS — G4700 Insomnia, unspecified: Secondary | ICD-10-CM | POA: Diagnosis not present

## 2016-05-28 DIAGNOSIS — F419 Anxiety disorder, unspecified: Secondary | ICD-10-CM | POA: Diagnosis not present

## 2016-05-28 DIAGNOSIS — I10 Essential (primary) hypertension: Secondary | ICD-10-CM | POA: Diagnosis not present

## 2016-05-28 DIAGNOSIS — R7303 Prediabetes: Secondary | ICD-10-CM | POA: Diagnosis not present

## 2016-05-28 DIAGNOSIS — M6281 Muscle weakness (generalized): Secondary | ICD-10-CM | POA: Diagnosis not present

## 2016-05-29 DIAGNOSIS — F419 Anxiety disorder, unspecified: Secondary | ICD-10-CM | POA: Diagnosis not present

## 2016-05-29 DIAGNOSIS — I10 Essential (primary) hypertension: Secondary | ICD-10-CM | POA: Diagnosis not present

## 2016-05-29 DIAGNOSIS — R55 Syncope and collapse: Secondary | ICD-10-CM | POA: Diagnosis not present

## 2016-05-29 DIAGNOSIS — R7303 Prediabetes: Secondary | ICD-10-CM | POA: Diagnosis not present

## 2016-05-29 DIAGNOSIS — M6281 Muscle weakness (generalized): Secondary | ICD-10-CM | POA: Diagnosis not present

## 2016-05-29 DIAGNOSIS — G4700 Insomnia, unspecified: Secondary | ICD-10-CM | POA: Diagnosis not present

## 2016-05-30 DIAGNOSIS — I10 Essential (primary) hypertension: Secondary | ICD-10-CM | POA: Diagnosis not present

## 2016-05-30 DIAGNOSIS — G4700 Insomnia, unspecified: Secondary | ICD-10-CM | POA: Diagnosis not present

## 2016-05-30 DIAGNOSIS — M6281 Muscle weakness (generalized): Secondary | ICD-10-CM | POA: Diagnosis not present

## 2016-05-30 DIAGNOSIS — R55 Syncope and collapse: Secondary | ICD-10-CM | POA: Diagnosis not present

## 2016-05-30 DIAGNOSIS — F419 Anxiety disorder, unspecified: Secondary | ICD-10-CM | POA: Diagnosis not present

## 2016-05-30 DIAGNOSIS — R7303 Prediabetes: Secondary | ICD-10-CM | POA: Diagnosis not present

## 2016-05-31 DIAGNOSIS — G4700 Insomnia, unspecified: Secondary | ICD-10-CM | POA: Diagnosis not present

## 2016-05-31 DIAGNOSIS — I10 Essential (primary) hypertension: Secondary | ICD-10-CM | POA: Diagnosis not present

## 2016-05-31 DIAGNOSIS — F419 Anxiety disorder, unspecified: Secondary | ICD-10-CM | POA: Diagnosis not present

## 2016-05-31 DIAGNOSIS — M6281 Muscle weakness (generalized): Secondary | ICD-10-CM | POA: Diagnosis not present

## 2016-05-31 DIAGNOSIS — R55 Syncope and collapse: Secondary | ICD-10-CM | POA: Diagnosis not present

## 2016-05-31 DIAGNOSIS — R7303 Prediabetes: Secondary | ICD-10-CM | POA: Diagnosis not present

## 2016-06-03 DIAGNOSIS — F419 Anxiety disorder, unspecified: Secondary | ICD-10-CM | POA: Diagnosis not present

## 2016-06-03 DIAGNOSIS — G4700 Insomnia, unspecified: Secondary | ICD-10-CM | POA: Diagnosis not present

## 2016-06-03 DIAGNOSIS — R7303 Prediabetes: Secondary | ICD-10-CM | POA: Diagnosis not present

## 2016-06-03 DIAGNOSIS — I10 Essential (primary) hypertension: Secondary | ICD-10-CM | POA: Diagnosis not present

## 2016-06-03 DIAGNOSIS — M6281 Muscle weakness (generalized): Secondary | ICD-10-CM | POA: Diagnosis not present

## 2016-06-03 DIAGNOSIS — R55 Syncope and collapse: Secondary | ICD-10-CM | POA: Diagnosis not present

## 2016-06-04 DIAGNOSIS — M6281 Muscle weakness (generalized): Secondary | ICD-10-CM | POA: Diagnosis not present

## 2016-06-04 DIAGNOSIS — R55 Syncope and collapse: Secondary | ICD-10-CM | POA: Diagnosis not present

## 2016-06-04 DIAGNOSIS — F419 Anxiety disorder, unspecified: Secondary | ICD-10-CM | POA: Diagnosis not present

## 2016-06-04 DIAGNOSIS — G4700 Insomnia, unspecified: Secondary | ICD-10-CM | POA: Diagnosis not present

## 2016-06-04 DIAGNOSIS — I10 Essential (primary) hypertension: Secondary | ICD-10-CM | POA: Diagnosis not present

## 2016-06-04 DIAGNOSIS — R7303 Prediabetes: Secondary | ICD-10-CM | POA: Diagnosis not present

## 2016-06-05 DIAGNOSIS — I10 Essential (primary) hypertension: Secondary | ICD-10-CM | POA: Diagnosis not present

## 2016-06-05 DIAGNOSIS — M6281 Muscle weakness (generalized): Secondary | ICD-10-CM | POA: Diagnosis not present

## 2016-06-05 DIAGNOSIS — F419 Anxiety disorder, unspecified: Secondary | ICD-10-CM | POA: Diagnosis not present

## 2016-06-05 DIAGNOSIS — R55 Syncope and collapse: Secondary | ICD-10-CM | POA: Diagnosis not present

## 2016-06-05 DIAGNOSIS — R7303 Prediabetes: Secondary | ICD-10-CM | POA: Diagnosis not present

## 2016-06-05 DIAGNOSIS — G4700 Insomnia, unspecified: Secondary | ICD-10-CM | POA: Diagnosis not present

## 2016-06-06 DIAGNOSIS — R7303 Prediabetes: Secondary | ICD-10-CM | POA: Diagnosis not present

## 2016-06-06 DIAGNOSIS — M6281 Muscle weakness (generalized): Secondary | ICD-10-CM | POA: Diagnosis not present

## 2016-06-06 DIAGNOSIS — G4700 Insomnia, unspecified: Secondary | ICD-10-CM | POA: Diagnosis not present

## 2016-06-06 DIAGNOSIS — R55 Syncope and collapse: Secondary | ICD-10-CM | POA: Diagnosis not present

## 2016-06-06 DIAGNOSIS — I10 Essential (primary) hypertension: Secondary | ICD-10-CM | POA: Diagnosis not present

## 2016-06-06 DIAGNOSIS — F419 Anxiety disorder, unspecified: Secondary | ICD-10-CM | POA: Diagnosis not present

## 2016-06-10 DIAGNOSIS — E039 Hypothyroidism, unspecified: Secondary | ICD-10-CM | POA: Diagnosis not present

## 2016-06-10 DIAGNOSIS — R251 Tremor, unspecified: Secondary | ICD-10-CM | POA: Diagnosis not present

## 2016-06-10 DIAGNOSIS — G4700 Insomnia, unspecified: Secondary | ICD-10-CM | POA: Diagnosis not present

## 2016-06-10 DIAGNOSIS — J45909 Unspecified asthma, uncomplicated: Secondary | ICD-10-CM | POA: Diagnosis not present

## 2016-06-10 DIAGNOSIS — M6281 Muscle weakness (generalized): Secondary | ICD-10-CM | POA: Diagnosis not present

## 2016-06-10 DIAGNOSIS — I10 Essential (primary) hypertension: Secondary | ICD-10-CM | POA: Diagnosis not present

## 2016-06-10 DIAGNOSIS — F419 Anxiety disorder, unspecified: Secondary | ICD-10-CM | POA: Diagnosis not present

## 2016-06-11 DIAGNOSIS — F419 Anxiety disorder, unspecified: Secondary | ICD-10-CM | POA: Diagnosis not present

## 2016-06-11 DIAGNOSIS — G4700 Insomnia, unspecified: Secondary | ICD-10-CM | POA: Diagnosis not present

## 2016-06-11 DIAGNOSIS — J45909 Unspecified asthma, uncomplicated: Secondary | ICD-10-CM | POA: Diagnosis not present

## 2016-06-11 DIAGNOSIS — M6281 Muscle weakness (generalized): Secondary | ICD-10-CM | POA: Diagnosis not present

## 2016-06-11 DIAGNOSIS — I10 Essential (primary) hypertension: Secondary | ICD-10-CM | POA: Diagnosis not present

## 2016-06-11 DIAGNOSIS — R251 Tremor, unspecified: Secondary | ICD-10-CM | POA: Diagnosis not present

## 2016-06-12 ENCOUNTER — Other Ambulatory Visit: Payer: Self-pay | Admitting: Family Medicine

## 2016-06-12 DIAGNOSIS — M6281 Muscle weakness (generalized): Secondary | ICD-10-CM | POA: Diagnosis not present

## 2016-06-12 DIAGNOSIS — J45909 Unspecified asthma, uncomplicated: Secondary | ICD-10-CM | POA: Diagnosis not present

## 2016-06-12 DIAGNOSIS — I10 Essential (primary) hypertension: Secondary | ICD-10-CM | POA: Diagnosis not present

## 2016-06-12 DIAGNOSIS — R251 Tremor, unspecified: Secondary | ICD-10-CM | POA: Diagnosis not present

## 2016-06-13 DIAGNOSIS — M6281 Muscle weakness (generalized): Secondary | ICD-10-CM | POA: Diagnosis not present

## 2016-06-13 DIAGNOSIS — I10 Essential (primary) hypertension: Secondary | ICD-10-CM | POA: Diagnosis not present

## 2016-06-13 DIAGNOSIS — R251 Tremor, unspecified: Secondary | ICD-10-CM | POA: Diagnosis not present

## 2016-06-13 DIAGNOSIS — G4700 Insomnia, unspecified: Secondary | ICD-10-CM | POA: Diagnosis not present

## 2016-06-13 DIAGNOSIS — J45909 Unspecified asthma, uncomplicated: Secondary | ICD-10-CM | POA: Diagnosis not present

## 2016-06-13 DIAGNOSIS — F419 Anxiety disorder, unspecified: Secondary | ICD-10-CM | POA: Diagnosis not present

## 2016-06-17 DIAGNOSIS — I10 Essential (primary) hypertension: Secondary | ICD-10-CM | POA: Diagnosis not present

## 2016-06-17 DIAGNOSIS — M6281 Muscle weakness (generalized): Secondary | ICD-10-CM | POA: Diagnosis not present

## 2016-06-17 DIAGNOSIS — R55 Syncope and collapse: Secondary | ICD-10-CM | POA: Diagnosis not present

## 2016-06-21 ENCOUNTER — Other Ambulatory Visit: Payer: Self-pay

## 2016-06-21 MED ORDER — HYDROCORTISONE-ACETIC ACID 1-2 % OT SOLN: 3.0000 [drp] | Freq: Three times a day (TID) | OTIC | 1 refills | Status: DC

## 2016-06-21 MED ORDER — DEPO-ESTRADIOL 5 MG/ML IM OIL: 5.0000 mg | TOPICAL_OIL | INTRAMUSCULAR | 0 refills | Status: DC

## 2016-07-02 DIAGNOSIS — M6281 Muscle weakness (generalized): Secondary | ICD-10-CM | POA: Diagnosis not present

## 2016-07-02 DIAGNOSIS — M2480 Other specific joint derangements of unspecified joint, not elsewhere classified: Secondary | ICD-10-CM | POA: Diagnosis not present

## 2016-07-02 DIAGNOSIS — M25569 Pain in unspecified knee: Secondary | ICD-10-CM | POA: Diagnosis not present

## 2016-07-02 DIAGNOSIS — I10 Essential (primary) hypertension: Secondary | ICD-10-CM | POA: Diagnosis not present

## 2016-07-02 DIAGNOSIS — R278 Other lack of coordination: Secondary | ICD-10-CM | POA: Diagnosis not present

## 2016-07-02 DIAGNOSIS — M150 Primary generalized (osteo)arthritis: Secondary | ICD-10-CM | POA: Diagnosis not present

## 2016-07-02 DIAGNOSIS — R262 Difficulty in walking, not elsewhere classified: Secondary | ICD-10-CM | POA: Diagnosis not present

## 2016-07-08 DIAGNOSIS — M2480 Other specific joint derangements of unspecified joint, not elsewhere classified: Secondary | ICD-10-CM | POA: Diagnosis not present

## 2016-07-08 DIAGNOSIS — M25569 Pain in unspecified knee: Secondary | ICD-10-CM | POA: Diagnosis not present

## 2016-07-08 DIAGNOSIS — R262 Difficulty in walking, not elsewhere classified: Secondary | ICD-10-CM | POA: Diagnosis not present

## 2016-07-08 DIAGNOSIS — M150 Primary generalized (osteo)arthritis: Secondary | ICD-10-CM | POA: Diagnosis not present

## 2016-07-08 DIAGNOSIS — M6281 Muscle weakness (generalized): Secondary | ICD-10-CM | POA: Diagnosis not present

## 2016-07-08 DIAGNOSIS — R278 Other lack of coordination: Secondary | ICD-10-CM | POA: Diagnosis not present

## 2016-07-10 DIAGNOSIS — M2480 Other specific joint derangements of unspecified joint, not elsewhere classified: Secondary | ICD-10-CM | POA: Diagnosis not present

## 2016-07-10 DIAGNOSIS — M6281 Muscle weakness (generalized): Secondary | ICD-10-CM | POA: Diagnosis not present

## 2016-07-10 DIAGNOSIS — R278 Other lack of coordination: Secondary | ICD-10-CM | POA: Diagnosis not present

## 2016-07-10 DIAGNOSIS — R262 Difficulty in walking, not elsewhere classified: Secondary | ICD-10-CM | POA: Diagnosis not present

## 2016-07-10 DIAGNOSIS — M150 Primary generalized (osteo)arthritis: Secondary | ICD-10-CM | POA: Diagnosis not present

## 2016-07-10 DIAGNOSIS — M25569 Pain in unspecified knee: Secondary | ICD-10-CM | POA: Diagnosis not present

## 2016-07-16 DIAGNOSIS — R278 Other lack of coordination: Secondary | ICD-10-CM | POA: Diagnosis not present

## 2016-07-16 DIAGNOSIS — M2480 Other specific joint derangements of unspecified joint, not elsewhere classified: Secondary | ICD-10-CM | POA: Diagnosis not present

## 2016-07-16 DIAGNOSIS — M6281 Muscle weakness (generalized): Secondary | ICD-10-CM | POA: Diagnosis not present

## 2016-07-16 DIAGNOSIS — M25569 Pain in unspecified knee: Secondary | ICD-10-CM | POA: Diagnosis not present

## 2016-07-16 DIAGNOSIS — M150 Primary generalized (osteo)arthritis: Secondary | ICD-10-CM | POA: Diagnosis not present

## 2016-07-16 DIAGNOSIS — R262 Difficulty in walking, not elsewhere classified: Secondary | ICD-10-CM | POA: Diagnosis not present

## 2016-07-23 DIAGNOSIS — M2480 Other specific joint derangements of unspecified joint, not elsewhere classified: Secondary | ICD-10-CM | POA: Diagnosis not present

## 2016-07-23 DIAGNOSIS — M6281 Muscle weakness (generalized): Secondary | ICD-10-CM | POA: Diagnosis not present

## 2016-07-23 DIAGNOSIS — R262 Difficulty in walking, not elsewhere classified: Secondary | ICD-10-CM | POA: Diagnosis not present

## 2016-07-23 DIAGNOSIS — I10 Essential (primary) hypertension: Secondary | ICD-10-CM | POA: Diagnosis not present

## 2016-07-23 DIAGNOSIS — R278 Other lack of coordination: Secondary | ICD-10-CM | POA: Diagnosis not present

## 2016-07-23 DIAGNOSIS — M150 Primary generalized (osteo)arthritis: Secondary | ICD-10-CM | POA: Diagnosis not present

## 2016-07-23 DIAGNOSIS — M25569 Pain in unspecified knee: Secondary | ICD-10-CM | POA: Diagnosis not present

## 2016-07-29 DIAGNOSIS — M150 Primary generalized (osteo)arthritis: Secondary | ICD-10-CM | POA: Diagnosis not present

## 2016-07-29 DIAGNOSIS — M2480 Other specific joint derangements of unspecified joint, not elsewhere classified: Secondary | ICD-10-CM | POA: Diagnosis not present

## 2016-07-29 DIAGNOSIS — M6281 Muscle weakness (generalized): Secondary | ICD-10-CM | POA: Diagnosis not present

## 2016-07-29 DIAGNOSIS — R278 Other lack of coordination: Secondary | ICD-10-CM | POA: Diagnosis not present

## 2016-07-29 DIAGNOSIS — R262 Difficulty in walking, not elsewhere classified: Secondary | ICD-10-CM | POA: Diagnosis not present

## 2016-07-29 DIAGNOSIS — M25569 Pain in unspecified knee: Secondary | ICD-10-CM | POA: Diagnosis not present

## 2016-07-30 DIAGNOSIS — R278 Other lack of coordination: Secondary | ICD-10-CM | POA: Diagnosis not present

## 2016-07-30 DIAGNOSIS — M150 Primary generalized (osteo)arthritis: Secondary | ICD-10-CM | POA: Diagnosis not present

## 2016-07-30 DIAGNOSIS — M25569 Pain in unspecified knee: Secondary | ICD-10-CM | POA: Diagnosis not present

## 2016-07-30 DIAGNOSIS — M6281 Muscle weakness (generalized): Secondary | ICD-10-CM | POA: Diagnosis not present

## 2016-07-30 DIAGNOSIS — R262 Difficulty in walking, not elsewhere classified: Secondary | ICD-10-CM | POA: Diagnosis not present

## 2016-07-30 DIAGNOSIS — M2480 Other specific joint derangements of unspecified joint, not elsewhere classified: Secondary | ICD-10-CM | POA: Diagnosis not present

## 2016-08-06 ENCOUNTER — Other Ambulatory Visit: Payer: Self-pay | Admitting: Family Medicine

## 2016-08-06 DIAGNOSIS — R0602 Shortness of breath: Secondary | ICD-10-CM | POA: Diagnosis not present

## 2016-08-14 DIAGNOSIS — M25562 Pain in left knee: Secondary | ICD-10-CM | POA: Diagnosis not present

## 2016-08-14 DIAGNOSIS — M170 Bilateral primary osteoarthritis of knee: Secondary | ICD-10-CM | POA: Diagnosis not present

## 2016-08-14 DIAGNOSIS — M25561 Pain in right knee: Secondary | ICD-10-CM | POA: Diagnosis not present

## 2016-08-14 DIAGNOSIS — Y9389 Activity, other specified: Secondary | ICD-10-CM | POA: Diagnosis not present

## 2016-09-17 DIAGNOSIS — E669 Obesity, unspecified: Secondary | ICD-10-CM | POA: Diagnosis not present

## 2016-09-17 DIAGNOSIS — I6523 Occlusion and stenosis of bilateral carotid arteries: Secondary | ICD-10-CM | POA: Diagnosis not present

## 2016-09-17 DIAGNOSIS — N182 Chronic kidney disease, stage 2 (mild): Secondary | ICD-10-CM | POA: Diagnosis not present

## 2016-09-17 DIAGNOSIS — E559 Vitamin D deficiency, unspecified: Secondary | ICD-10-CM | POA: Diagnosis not present

## 2016-09-17 DIAGNOSIS — G252 Other specified forms of tremor: Secondary | ICD-10-CM | POA: Diagnosis not present

## 2016-09-17 DIAGNOSIS — F419 Anxiety disorder, unspecified: Secondary | ICD-10-CM | POA: Diagnosis not present

## 2016-09-17 DIAGNOSIS — Z6837 Body mass index (BMI) 37.0-37.9, adult: Secondary | ICD-10-CM | POA: Diagnosis not present

## 2016-09-17 DIAGNOSIS — M170 Bilateral primary osteoarthritis of knee: Secondary | ICD-10-CM | POA: Diagnosis not present

## 2016-09-17 DIAGNOSIS — R2681 Unsteadiness on feet: Secondary | ICD-10-CM | POA: Diagnosis not present

## 2016-09-17 DIAGNOSIS — I129 Hypertensive chronic kidney disease with stage 1 through stage 4 chronic kidney disease, or unspecified chronic kidney disease: Secondary | ICD-10-CM | POA: Diagnosis not present

## 2016-09-17 DIAGNOSIS — R55 Syncope and collapse: Secondary | ICD-10-CM | POA: Diagnosis not present

## 2016-09-17 DIAGNOSIS — E039 Hypothyroidism, unspecified: Secondary | ICD-10-CM | POA: Diagnosis not present

## 2016-11-16 DIAGNOSIS — R2689 Other abnormalities of gait and mobility: Secondary | ICD-10-CM | POA: Diagnosis not present

## 2016-11-16 DIAGNOSIS — M6281 Muscle weakness (generalized): Secondary | ICD-10-CM | POA: Diagnosis not present

## 2016-11-16 DIAGNOSIS — Z9181 History of falling: Secondary | ICD-10-CM | POA: Diagnosis not present

## 2016-11-16 DIAGNOSIS — R262 Difficulty in walking, not elsewhere classified: Secondary | ICD-10-CM | POA: Diagnosis not present

## 2016-11-19 DIAGNOSIS — M25561 Pain in right knee: Secondary | ICD-10-CM | POA: Diagnosis not present

## 2016-11-19 DIAGNOSIS — G8929 Other chronic pain: Secondary | ICD-10-CM | POA: Diagnosis not present

## 2016-11-19 DIAGNOSIS — M1710 Unilateral primary osteoarthritis, unspecified knee: Secondary | ICD-10-CM | POA: Diagnosis not present

## 2016-11-19 DIAGNOSIS — M170 Bilateral primary osteoarthritis of knee: Secondary | ICD-10-CM | POA: Diagnosis not present

## 2016-11-19 DIAGNOSIS — M25562 Pain in left knee: Secondary | ICD-10-CM | POA: Diagnosis not present

## 2016-11-20 DIAGNOSIS — G250 Essential tremor: Secondary | ICD-10-CM | POA: Diagnosis not present

## 2016-11-20 DIAGNOSIS — M1710 Unilateral primary osteoarthritis, unspecified knee: Secondary | ICD-10-CM | POA: Insufficient documentation

## 2016-11-20 DIAGNOSIS — F411 Generalized anxiety disorder: Secondary | ICD-10-CM | POA: Diagnosis not present

## 2016-11-20 DIAGNOSIS — R208 Other disturbances of skin sensation: Secondary | ICD-10-CM | POA: Diagnosis not present

## 2016-11-20 DIAGNOSIS — R2689 Other abnormalities of gait and mobility: Secondary | ICD-10-CM | POA: Diagnosis not present

## 2016-11-20 DIAGNOSIS — G4700 Insomnia, unspecified: Secondary | ICD-10-CM | POA: Diagnosis not present

## 2016-11-26 DIAGNOSIS — M1710 Unilateral primary osteoarthritis, unspecified knee: Secondary | ICD-10-CM | POA: Diagnosis not present

## 2016-11-26 DIAGNOSIS — M170 Bilateral primary osteoarthritis of knee: Secondary | ICD-10-CM | POA: Diagnosis not present

## 2016-11-29 DIAGNOSIS — M6281 Muscle weakness (generalized): Secondary | ICD-10-CM | POA: Diagnosis not present

## 2016-11-29 DIAGNOSIS — R2689 Other abnormalities of gait and mobility: Secondary | ICD-10-CM | POA: Diagnosis not present

## 2016-11-29 DIAGNOSIS — Z9181 History of falling: Secondary | ICD-10-CM | POA: Diagnosis not present

## 2016-11-29 DIAGNOSIS — R262 Difficulty in walking, not elsewhere classified: Secondary | ICD-10-CM | POA: Diagnosis not present

## 2016-12-09 DIAGNOSIS — R2689 Other abnormalities of gait and mobility: Secondary | ICD-10-CM | POA: Diagnosis not present

## 2016-12-09 DIAGNOSIS — R262 Difficulty in walking, not elsewhere classified: Secondary | ICD-10-CM | POA: Diagnosis not present

## 2016-12-09 DIAGNOSIS — M6281 Muscle weakness (generalized): Secondary | ICD-10-CM | POA: Diagnosis not present

## 2016-12-09 DIAGNOSIS — Z9181 History of falling: Secondary | ICD-10-CM | POA: Diagnosis not present

## 2016-12-10 DIAGNOSIS — M170 Bilateral primary osteoarthritis of knee: Secondary | ICD-10-CM | POA: Diagnosis not present

## 2016-12-10 DIAGNOSIS — M1710 Unilateral primary osteoarthritis, unspecified knee: Secondary | ICD-10-CM | POA: Diagnosis not present

## 2016-12-11 DIAGNOSIS — R2689 Other abnormalities of gait and mobility: Secondary | ICD-10-CM | POA: Diagnosis not present

## 2016-12-11 DIAGNOSIS — R262 Difficulty in walking, not elsewhere classified: Secondary | ICD-10-CM | POA: Diagnosis not present

## 2016-12-11 DIAGNOSIS — Z9181 History of falling: Secondary | ICD-10-CM | POA: Diagnosis not present

## 2016-12-11 DIAGNOSIS — M6281 Muscle weakness (generalized): Secondary | ICD-10-CM | POA: Diagnosis not present

## 2016-12-16 DIAGNOSIS — M6281 Muscle weakness (generalized): Secondary | ICD-10-CM | POA: Diagnosis not present

## 2016-12-16 DIAGNOSIS — Z9181 History of falling: Secondary | ICD-10-CM | POA: Diagnosis not present

## 2016-12-16 DIAGNOSIS — R2689 Other abnormalities of gait and mobility: Secondary | ICD-10-CM | POA: Diagnosis not present

## 2016-12-16 DIAGNOSIS — R262 Difficulty in walking, not elsewhere classified: Secondary | ICD-10-CM | POA: Diagnosis not present

## 2016-12-20 DIAGNOSIS — R2689 Other abnormalities of gait and mobility: Secondary | ICD-10-CM | POA: Diagnosis not present

## 2016-12-20 DIAGNOSIS — Z9181 History of falling: Secondary | ICD-10-CM | POA: Diagnosis not present

## 2016-12-20 DIAGNOSIS — M6281 Muscle weakness (generalized): Secondary | ICD-10-CM | POA: Diagnosis not present

## 2016-12-20 DIAGNOSIS — R262 Difficulty in walking, not elsewhere classified: Secondary | ICD-10-CM | POA: Diagnosis not present

## 2017-01-15 DIAGNOSIS — M6281 Muscle weakness (generalized): Secondary | ICD-10-CM | POA: Diagnosis not present

## 2017-01-15 DIAGNOSIS — Z9181 History of falling: Secondary | ICD-10-CM | POA: Diagnosis not present

## 2017-01-15 DIAGNOSIS — R2689 Other abnormalities of gait and mobility: Secondary | ICD-10-CM | POA: Diagnosis not present

## 2017-01-15 DIAGNOSIS — R262 Difficulty in walking, not elsewhere classified: Secondary | ICD-10-CM | POA: Diagnosis not present

## 2017-01-20 DIAGNOSIS — R2689 Other abnormalities of gait and mobility: Secondary | ICD-10-CM | POA: Diagnosis not present

## 2017-01-20 DIAGNOSIS — M6281 Muscle weakness (generalized): Secondary | ICD-10-CM | POA: Diagnosis not present

## 2017-01-20 DIAGNOSIS — R262 Difficulty in walking, not elsewhere classified: Secondary | ICD-10-CM | POA: Diagnosis not present

## 2017-01-20 DIAGNOSIS — Z9181 History of falling: Secondary | ICD-10-CM | POA: Diagnosis not present

## 2017-01-29 DIAGNOSIS — E559 Vitamin D deficiency, unspecified: Secondary | ICD-10-CM | POA: Diagnosis not present

## 2017-01-29 DIAGNOSIS — E039 Hypothyroidism, unspecified: Secondary | ICD-10-CM | POA: Diagnosis not present

## 2017-01-29 DIAGNOSIS — N182 Chronic kidney disease, stage 2 (mild): Secondary | ICD-10-CM | POA: Diagnosis not present

## 2017-01-29 DIAGNOSIS — R7303 Prediabetes: Secondary | ICD-10-CM | POA: Diagnosis not present

## 2017-01-29 DIAGNOSIS — E784 Other hyperlipidemia: Secondary | ICD-10-CM | POA: Diagnosis not present

## 2017-01-29 DIAGNOSIS — K219 Gastro-esophageal reflux disease without esophagitis: Secondary | ICD-10-CM | POA: Diagnosis not present

## 2017-01-29 DIAGNOSIS — F419 Anxiety disorder, unspecified: Secondary | ICD-10-CM | POA: Diagnosis not present

## 2017-01-29 DIAGNOSIS — M170 Bilateral primary osteoarthritis of knee: Secondary | ICD-10-CM | POA: Diagnosis not present

## 2017-01-29 DIAGNOSIS — S3141XA Laceration without foreign body of vagina and vulva, initial encounter: Secondary | ICD-10-CM | POA: Diagnosis not present

## 2017-01-29 DIAGNOSIS — I129 Hypertensive chronic kidney disease with stage 1 through stage 4 chronic kidney disease, or unspecified chronic kidney disease: Secondary | ICD-10-CM | POA: Diagnosis not present

## 2017-02-27 ENCOUNTER — Emergency Department (HOSPITAL_BASED_OUTPATIENT_CLINIC_OR_DEPARTMENT_OTHER): Payer: Medicare Other

## 2017-02-27 ENCOUNTER — Encounter (HOSPITAL_BASED_OUTPATIENT_CLINIC_OR_DEPARTMENT_OTHER): Payer: Self-pay | Admitting: Emergency Medicine

## 2017-02-27 ENCOUNTER — Emergency Department (HOSPITAL_BASED_OUTPATIENT_CLINIC_OR_DEPARTMENT_OTHER)
Admission: EM | Admit: 2017-02-27 | Discharge: 2017-02-27 | Disposition: A | Discharge status: Home / Self Care | Payer: Medicare Other | Attending: Emergency Medicine | Admitting: Emergency Medicine

## 2017-02-27 DIAGNOSIS — I10 Essential (primary) hypertension: Secondary | ICD-10-CM | POA: Insufficient documentation

## 2017-02-27 DIAGNOSIS — R55 Syncope and collapse: Secondary | ICD-10-CM | POA: Diagnosis not present

## 2017-02-27 DIAGNOSIS — J45909 Unspecified asthma, uncomplicated: Secondary | ICD-10-CM | POA: Insufficient documentation

## 2017-02-27 DIAGNOSIS — Z79899 Other long term (current) drug therapy: Secondary | ICD-10-CM | POA: Insufficient documentation

## 2017-02-27 DIAGNOSIS — R531 Weakness: Secondary | ICD-10-CM | POA: Diagnosis not present

## 2017-02-27 DIAGNOSIS — R404 Transient alteration of awareness: Secondary | ICD-10-CM | POA: Diagnosis not present

## 2017-02-27 DIAGNOSIS — Z87891 Personal history of nicotine dependence: Secondary | ICD-10-CM | POA: Insufficient documentation

## 2017-02-27 DIAGNOSIS — R220 Localized swelling, mass and lump, head: Secondary | ICD-10-CM | POA: Diagnosis not present

## 2017-02-27 LAB — URINALYSIS, ROUTINE W REFLEX MICROSCOPIC
BILIRUBIN URINE: NEGATIVE
Glucose, UA: NEGATIVE mg/dL
Hgb urine dipstick: NEGATIVE
KETONES UR: NEGATIVE mg/dL
LEUKOCYTES UA: NEGATIVE
NITRITE: NEGATIVE
Protein, ur: NEGATIVE mg/dL
SPECIFIC GRAVITY, URINE: 1.014 (ref 1.005–1.030)
pH: 7 (ref 5.0–8.0)

## 2017-02-27 LAB — CBC WITH DIFFERENTIAL/PLATELET
BASOS ABS: 0 10*3/uL (ref 0.0–0.1)
BASOS PCT: 0 %
EOS ABS: 0.3 10*3/uL (ref 0.0–0.7)
Eosinophils Relative: 3 %
HEMATOCRIT: 43.7 % (ref 36.0–46.0)
HEMOGLOBIN: 15 g/dL (ref 12.0–15.0)
Lymphocytes Relative: 21 %
Lymphs Abs: 2 10*3/uL (ref 0.7–4.0)
MCH: 32.1 pg (ref 26.0–34.0)
MCHC: 34.3 g/dL (ref 30.0–36.0)
MCV: 93.6 fL (ref 78.0–100.0)
MONOS PCT: 8 %
Monocytes Absolute: 0.8 10*3/uL (ref 0.1–1.0)
NEUTROS ABS: 6.3 10*3/uL (ref 1.7–7.7)
NEUTROS PCT: 68 %
Platelets: 271 10*3/uL (ref 150–400)
RBC: 4.67 MIL/uL (ref 3.87–5.11)
RDW: 13.2 % (ref 11.5–15.5)
WBC: 9.3 10*3/uL (ref 4.0–10.5)

## 2017-02-27 LAB — BASIC METABOLIC PANEL
Anion gap: 9 (ref 5–15)
BUN: 16 mg/dL (ref 6–20)
CALCIUM: 9.4 mg/dL (ref 8.9–10.3)
CHLORIDE: 100 mmol/L — AB (ref 101–111)
CO2: 30 mmol/L (ref 22–32)
CREATININE: 0.88 mg/dL (ref 0.44–1.00)
GFR calc non Af Amer: >60 mL/min (ref >60)
Glucose, Bld: 107 mg/dL — ABNORMAL HIGH (ref 65–99)
Potassium: 4.4 mmol/L (ref 3.5–5.1)
SODIUM: 139 mmol/L (ref 135–145)

## 2017-02-27 MED ORDER — SODIUM CHLORIDE 0.9 % IV BOLUS (SEPSIS)
1000.0000 mL | Freq: Once | INTRAVENOUS | Status: AC
  Administered 2017-02-27: 1000 mL via INTRAVENOUS

## 2017-02-27 NOTE — ED Notes (Signed)
Pt on monitor 

## 2017-02-27 NOTE — ED Provider Notes (Signed)
[facility] Provider Note   CSN: [id number] Arrival date & time: 02/27/17  1824  By signing my name below, I, Jeanell Sparrow, attest that this documentation has been prepared under the direction and in the presence of Veryl Speak, MD. Electronically Signed: Jeanell Sparrow, Scribe. 02/27/2017. 6:36 PM.  History   Chief Complaint Chief Complaint  Patient presents with  . Weakness   The history is provided by the patient. No language interpreter was used.  Weakness  Primary symptoms include no focal weakness. This is a new problem. The current episode started 3 to 5 hours ago. The problem has been rapidly improving. There was no focality noted. There has been no fever. Pertinent negatives include no confusion.   HPI Comments: Kathleen Dunn is a 76 y.o. female who presents to the Emergency Department complaining of constant moderate generalized weakness that started today. She reports she ate pie and meatballs today at a hotel before she started feeling nauseous, weak, and lightheaded. No LOC. Her symptoms improved after she laid down, but hotel staff insisted she'd get ED evaluation. She resumed an antibiotic 2 days ago for a UTI. She admits to head injury 3 days ago. Denies any decreased appetite, abdominal pain, dysuria, or other complaints at this time.  Past Medical History:  Diagnosis Date  . Asthma   . Hypertension   . Lung nodule < 6cm on CT   . Thyroid disease     Patient Active Problem List   Diagnosis Date Noted  . GAD (generalized anxiety disorder) 11/06/2015  . Coronary artery calcification 02/17/2015  . IFG (impaired fasting glucose) 01/12/2015  . Lung nodule < 6cm on CT 05/20/2014  . Obesity (BMI 30.0-34.9) 08/26/2013  . Chronic fatigue 02/18/2013  . DEPRESSION 01/08/2008  . FATIGUE 01/08/2008  . Hypothyroidism 12/04/2007  . HYPONATREMIA 12/04/2007  . ABNORMAL MAMMOGRAM 08/25/2007  . HYPERCHOLESTEROLEMIA 06/24/2006  . PERNICIOUS ANEMIA 06/24/2006    . HYPERTENSION, BENIGN SYSTEMIC 06/24/2006  . LOW BACK PAIN 06/24/2006  . TREMOR 06/24/2006    Past Surgical History:  Procedure Laterality Date  . ABDOMINAL HYSTERECTOMY  age 21   oophorectomy  . BREAST SURGERY     benign bx of left breast    OB History    No data available       Home Medications    Prior to Admission medications   Medication Sig Start Date End Date Taking? Authorizing Provider  acetic acid-hydrocortisone (VOSOL-HC) otic solution Place 3 drops into both ears 3 (three) times daily. 06/21/16   Hali Marry, MD  benzonatate (TESSALON) 100 MG capsule Take 1-2 capsules (100-200 mg total) by mouth 3 (three) times daily as needed for cough. 04/01/16   Hali Marry, MD  Cholecalciferol (VITAMIN D3) 1000 units CAPS Take 1 capsule (1,000 Units total) by mouth daily. 11/03/15   Hali Marry, MD  clonazePAM (KLONOPIN) 1 MG tablet Take 1 tablet (1 mg total) by mouth 2 (two) times daily as needed for anxiety. 04/01/16   Hali Marry, MD  DEPO-ESTRADIOL 5 MG/ML injection Inject 1 mL (5 mg total) into the muscle every 28 (twenty-eight) days. 06/21/16   Hali Marry, MD  ketoconazole (NIZORAL) 2 % shampoo APPLY TO AFFECTED AREA TWICE A WEEK 11/03/15   Hali Marry, MD  levothyroxine (SYNTHROID, LEVOTHROID) 50 MCG tablet Take 1 tablet (50 mcg total) by mouth daily. NEED FOLLOW UP VISIT FOR MORE REFILLS 08/06/16   Hali Marry, MD  losartan (COZAAR) 25 MG  tablet Take 1 tablet (25 mg total) by mouth daily. NEED FOLLOW UP VISIT FOR MORE REFILLS 06/12/16   Hali Marry, MD  metoprolol tartrate (LOPRESSOR) 25 MG tablet Take 1 tablet (25 mg total) by mouth daily. 11/03/15   Hali Marry, MD  QVAR 40 MCG/ACT inhaler USE 2 INHALATIONS TWICE A DAY 07/22/14   Hali Marry, MD  ranitidine (ZANTAC) 150 MG capsule TAKE 1 CAPSULE TWICE A DAY AS NEEDED FOR HEARTBURN 11/03/15   Hali Marry, MD  Red Yeast  Rice Extract (RED YEAST RICE PO) Take by mouth.    [provider]  simvastatin (ZOCOR) 40 MG tablet Take 20 mg by mouth daily.    [provider]  traMADol (ULTRAM) 50 MG tablet Take 1 tablet (50 mg total) by mouth every 12 (twelve) hours as needed (knee pain). 02/21/16   Hali Marry, MD  triamterene-hydrochlorothiazide (DYAZIDE) 37.5-25 MG capsule TAKE 1 CAPSULE DAILY 03/14/16   Hali Marry, MD    Family History Family History  Problem Relation Age of Onset  . Heart attack Father     Social History Social History  Substance Use Topics  . Smoking status: Former Smoker    Quit date: 09/17/1971  . Smokeless tobacco: Never Used  . Alcohol use No     Allergies   Albuterol; Codeine; Erythromycin; Lipitor [atorvastatin]; Morphine and related; Penicillamine; Simvastatin; Sulfonamide derivatives; and Vistaril [hydroxyzine hcl]   Review of Systems Review of Systems  Constitutional: Negative for appetite change.  Gastrointestinal: Positive for nausea. Negative for abdominal pain.  Genitourinary: Negative for dysuria.  Neurological: Positive for weakness and light-headedness. Negative for focal weakness and syncope.  Psychiatric/Behavioral: Negative for confusion.  All other systems reviewed and are negative.    Physical Exam Updated Vital Signs BP (!) 149/83 (BP Location: Right Arm)   Pulse (!) 59   Temp 97.7 F (36.5 C) (Oral)   Resp 18   Ht 5\' 1"  (1.549 m)   Wt 190 lb (86.2 kg)   SpO2 96%   BMI 35.90 kg/m   Physical Exam  Constitutional: She is oriented to person, place, and time. She appears well-developed and well-nourished. No distress.  HENT:  Head: Normocephalic and atraumatic.  Mouth/Throat: Oropharynx is clear and moist. No oropharyngeal exudate.  Eyes: Conjunctivae and EOM are normal. Pupils are equal, round, and reactive to light.  Neck: Normal range of motion. Neck supple.  No meningismus.  Cardiovascular: Normal rate,  regular rhythm, normal heart sounds and intact distal pulses.   No murmur heard. Pulmonary/Chest: Effort normal and breath sounds normal. No respiratory distress.  Abdominal: Soft. Bowel sounds are normal. She exhibits no distension. There is no tenderness. There is no rebound and no guarding.  Musculoskeletal: Normal range of motion. She exhibits no edema or tenderness.  Neurological: She is alert and oriented to person, place, and time. No cranial nerve deficit. She exhibits normal muscle tone. Coordination normal.   5/5 strength throughout. CN 2-12 intact.Equal grip strength.   Skin: Skin is warm.  Psychiatric: She has a normal mood and affect. Her behavior is normal.  Nursing note and vitals reviewed.    ED Treatments / Results  DIAGNOSTIC STUDIES: Oxygen Saturation is 96% on RA, normal by my interpretation.    COORDINATION OF CARE: 6:40 PM- Pt advised of plan for treatment and pt agrees.  Labs (all labs ordered are listed, but only abnormal results are displayed) Labs Reviewed  CBC WITH DIFFERENTIAL/PLATELET  BASIC METABOLIC  PANEL  URINALYSIS, ROUTINE W REFLEX MICROSCOPIC    EKG  EKG Interpretation None       Radiology No results found.  Procedures Procedures (including critical care time)  Medications Ordered in ED Medications  sodium chloride 0.9 % bolus 1,000 mL (not administered)     Initial Impression / Assessment and Plan / ED Course  I have reviewed the triage vital signs and the nursing notes.  Pertinent labs & imaging results that were available during my care of the patient were reviewed by me and considered in my medical decision making (see chart for details).  Patient presents here with complaints of dizziness and near syncope that occurred at home. She reports eating meatballs this evening, then became lightheaded and felt as if she was going to pass out. This lasted for several moments, then resolved. She was with a friend at the time who  insisted she come to be evaluated. 911 was called and patient was transported by EMS.  Her physical examination is unremarkable and workup is reassuring. Her laboratory studies are normal, urinalysis is clear, and head CT is negative. Orthostatic vital signs were obtained in the emergency department and did not fluctuate. She was also ambulated throughout the department without difficulty after receiving normal saline.  She will be discharged, to follow-up as needed for any problems.  Final Clinical Impressions(s) / ED Diagnoses   Final diagnoses:  None    New Prescriptions New Prescriptions   No medications on file   I personally performed the services described in this documentation, which was scribed in my presence. The recorded information has been reviewed and is accurate.        Veryl Speak, MD 02/27/17 [phone]

## 2017-02-27 NOTE — ED Triage Notes (Signed)
Per ems: The patient was standing up and started to feel weak all over and the hotel staff where she lives called EMS. The patient reports that she has had multiple respiratory "issues" since December and over the last week she has had a decrease in PO intake. The patient reports that she stood up and did not think she could make it back to her room. Patient reports that she did not even want to come, but the staff called EMS - The patient is awake / a/o x 4 - patient reports denies any SOB, patient able to walk without any difficulty.

## 2017-02-27 NOTE — Discharge Instructions (Signed)
Continue your medications as before.  Follow-up with your primary Dr. if your symptoms are not improving in the next 2-3 days, and return to the ER if symptoms significantly worsen or change.

## 2017-03-15 ENCOUNTER — Encounter (HOSPITAL_BASED_OUTPATIENT_CLINIC_OR_DEPARTMENT_OTHER): Payer: Self-pay | Admitting: *Deleted

## 2017-03-15 ENCOUNTER — Emergency Department (HOSPITAL_BASED_OUTPATIENT_CLINIC_OR_DEPARTMENT_OTHER): Payer: Medicare Other

## 2017-03-15 ENCOUNTER — Observation Stay (HOSPITAL_COMMUNITY): Payer: Medicare Other

## 2017-03-15 ENCOUNTER — Inpatient Hospital Stay (HOSPITAL_BASED_OUTPATIENT_CLINIC_OR_DEPARTMENT_OTHER)
Admission: EM | Admit: 2017-03-15 | Discharge: 2017-04-01 | DRG: 024 | Disposition: A | Discharge status: Skilled Nursing Facility | Payer: Medicare Other | Attending: Family Medicine | Admitting: Family Medicine

## 2017-03-15 DIAGNOSIS — Z888 Allergy status to other drugs, medicaments and biological substances status: Secondary | ICD-10-CM

## 2017-03-15 DIAGNOSIS — I672 Cerebral atherosclerosis: Secondary | ICD-10-CM | POA: Diagnosis present

## 2017-03-15 DIAGNOSIS — E039 Hypothyroidism, unspecified: Secondary | ICD-10-CM | POA: Diagnosis not present

## 2017-03-15 DIAGNOSIS — Z880 Allergy status to penicillin: Secondary | ICD-10-CM

## 2017-03-15 DIAGNOSIS — K5900 Constipation, unspecified: Secondary | ICD-10-CM | POA: Diagnosis not present

## 2017-03-15 DIAGNOSIS — M1711 Unilateral primary osteoarthritis, right knee: Secondary | ICD-10-CM | POA: Diagnosis not present

## 2017-03-15 DIAGNOSIS — R55 Syncope and collapse: Secondary | ICD-10-CM | POA: Diagnosis not present

## 2017-03-15 DIAGNOSIS — Z882 Allergy status to sulfonamides status: Secondary | ICD-10-CM

## 2017-03-15 DIAGNOSIS — F411 Generalized anxiety disorder: Secondary | ICD-10-CM | POA: Diagnosis not present

## 2017-03-15 DIAGNOSIS — I651 Occlusion and stenosis of basilar artery: Secondary | ICD-10-CM

## 2017-03-15 DIAGNOSIS — I6302 Cerebral infarction due to thrombosis of basilar artery: Principal | ICD-10-CM | POA: Diagnosis present

## 2017-03-15 DIAGNOSIS — Z6835 Body mass index (BMI) 35.0-35.9, adult: Secondary | ICD-10-CM

## 2017-03-15 DIAGNOSIS — R402253 Coma scale, best verbal response, oriented, at hospital admission: Secondary | ICD-10-CM | POA: Diagnosis present

## 2017-03-15 DIAGNOSIS — Z7951 Long term (current) use of inhaled steroids: Secondary | ICD-10-CM

## 2017-03-15 DIAGNOSIS — R402363 Coma scale, best motor response, obeys commands, at hospital admission: Secondary | ICD-10-CM | POA: Diagnosis present

## 2017-03-15 DIAGNOSIS — G250 Essential tremor: Secondary | ICD-10-CM | POA: Diagnosis present

## 2017-03-15 DIAGNOSIS — I10 Essential (primary) hypertension: Secondary | ICD-10-CM | POA: Diagnosis present

## 2017-03-15 DIAGNOSIS — E782 Mixed hyperlipidemia: Secondary | ICD-10-CM | POA: Diagnosis present

## 2017-03-15 DIAGNOSIS — E669 Obesity, unspecified: Secondary | ICD-10-CM | POA: Diagnosis present

## 2017-03-15 DIAGNOSIS — R42 Dizziness and giddiness: Secondary | ICD-10-CM | POA: Diagnosis present

## 2017-03-15 DIAGNOSIS — I639 Cerebral infarction, unspecified: Secondary | ICD-10-CM | POA: Diagnosis not present

## 2017-03-15 DIAGNOSIS — J45909 Unspecified asthma, uncomplicated: Secondary | ICD-10-CM | POA: Diagnosis present

## 2017-03-15 DIAGNOSIS — I6300 Cerebral infarction due to thrombosis of unspecified precerebral artery: Secondary | ICD-10-CM

## 2017-03-15 DIAGNOSIS — Z8673 Personal history of transient ischemic attack (TIA), and cerebral infarction without residual deficits: Secondary | ICD-10-CM

## 2017-03-15 DIAGNOSIS — R0982 Postnasal drip: Secondary | ICD-10-CM | POA: Diagnosis not present

## 2017-03-15 DIAGNOSIS — R29700 NIHSS score 0: Secondary | ICD-10-CM | POA: Diagnosis present

## 2017-03-15 DIAGNOSIS — I6322 Cerebral infarction due to unspecified occlusion or stenosis of basilar arteries: Secondary | ICD-10-CM

## 2017-03-15 DIAGNOSIS — R0902 Hypoxemia: Secondary | ICD-10-CM

## 2017-03-15 DIAGNOSIS — Z885 Allergy status to narcotic agent status: Secondary | ICD-10-CM

## 2017-03-15 DIAGNOSIS — Z87891 Personal history of nicotine dependence: Secondary | ICD-10-CM

## 2017-03-15 DIAGNOSIS — I771 Stricture of artery: Secondary | ICD-10-CM

## 2017-03-15 DIAGNOSIS — I739 Peripheral vascular disease, unspecified: Secondary | ICD-10-CM | POA: Diagnosis present

## 2017-03-15 DIAGNOSIS — F329 Major depressive disorder, single episode, unspecified: Secondary | ICD-10-CM | POA: Diagnosis present

## 2017-03-15 DIAGNOSIS — Z79890 Hormone replacement therapy (postmenopausal): Secondary | ICD-10-CM

## 2017-03-15 DIAGNOSIS — Z881 Allergy status to other antibiotic agents status: Secondary | ICD-10-CM

## 2017-03-15 DIAGNOSIS — R51 Headache: Secondary | ICD-10-CM | POA: Diagnosis present

## 2017-03-15 DIAGNOSIS — M545 Low back pain: Secondary | ICD-10-CM | POA: Diagnosis not present

## 2017-03-15 DIAGNOSIS — R0689 Other abnormalities of breathing: Secondary | ICD-10-CM

## 2017-03-15 DIAGNOSIS — E7800 Pure hypercholesterolemia, unspecified: Secondary | ICD-10-CM | POA: Diagnosis present

## 2017-03-15 DIAGNOSIS — R402143 Coma scale, eyes open, spontaneous, at hospital admission: Secondary | ICD-10-CM | POA: Diagnosis present

## 2017-03-15 DIAGNOSIS — I6523 Occlusion and stenosis of bilateral carotid arteries: Secondary | ICD-10-CM

## 2017-03-15 LAB — COMPREHENSIVE METABOLIC PANEL
ALT: 18 U/L (ref 14–54)
ANION GAP: 8 (ref 5–15)
AST: 21 U/L (ref 15–41)
Albumin: 3.7 g/dL (ref 3.5–5.0)
Alkaline Phosphatase: 43 U/L (ref 38–126)
BILIRUBIN TOTAL: 0.4 mg/dL (ref 0.3–1.2)
BUN: 14 mg/dL (ref 6–20)
CHLORIDE: 101 mmol/L (ref 101–111)
CO2: 28 mmol/L (ref 22–32)
Calcium: 9.5 mg/dL (ref 8.9–10.3)
Creatinine, Ser: 0.79 mg/dL (ref 0.44–1.00)
Glucose, Bld: 116 mg/dL — ABNORMAL HIGH (ref 65–99)
POTASSIUM: 4.1 mmol/L (ref 3.5–5.1)
Sodium: 137 mmol/L (ref 135–145)
TOTAL PROTEIN: 6.8 g/dL (ref 6.5–8.1)

## 2017-03-15 LAB — URINALYSIS, ROUTINE W REFLEX MICROSCOPIC
BILIRUBIN URINE: NEGATIVE
Glucose, UA: NEGATIVE mg/dL
Hgb urine dipstick: NEGATIVE
Ketones, ur: NEGATIVE mg/dL
Leukocytes, UA: NEGATIVE
Nitrite: NEGATIVE
PH: 6.5 (ref 5.0–8.0)
Protein, ur: NEGATIVE mg/dL
SPECIFIC GRAVITY, URINE: 1.01 (ref 1.005–1.030)

## 2017-03-15 LAB — CBC WITH DIFFERENTIAL/PLATELET
BASOS ABS: 0 10*3/uL (ref 0.0–0.1)
Basophils Relative: 1 %
EOS PCT: 5 %
Eosinophils Absolute: 0.4 10*3/uL (ref 0.0–0.7)
HEMATOCRIT: 42.6 % (ref 36.0–46.0)
Hemoglobin: 14.9 g/dL (ref 12.0–15.0)
LYMPHS ABS: 2.6 10*3/uL (ref 0.7–4.0)
Lymphocytes Relative: 31 %
MCH: 32.3 pg (ref 26.0–34.0)
MCHC: 35 g/dL (ref 30.0–36.0)
MCV: 92.2 fL (ref 78.0–100.0)
MONO ABS: 0.9 10*3/uL (ref 0.1–1.0)
MONOS PCT: 10 %
Neutro Abs: 4.5 10*3/uL (ref 1.7–7.7)
Neutrophils Relative %: 53 %
PLATELETS: 261 10*3/uL (ref 150–400)
RBC: 4.62 MIL/uL (ref 3.87–5.11)
RDW: 13.2 % (ref 11.5–15.5)
WBC: 8.4 10*3/uL (ref 4.0–10.5)

## 2017-03-15 LAB — RAPID URINE DRUG SCREEN, HOSP PERFORMED
AMPHETAMINES: NOT DETECTED
BENZODIAZEPINES: NOT DETECTED
Barbiturates: NOT DETECTED
Cocaine: NOT DETECTED
OPIATES: NOT DETECTED
Tetrahydrocannabinol: NOT DETECTED

## 2017-03-15 LAB — MAGNESIUM: MAGNESIUM: 1.7 mg/dL (ref 1.7–2.4)

## 2017-03-15 MED ORDER — CLONAZEPAM 1 MG PO TABS
1.0000 mg | ORAL_TABLET | Freq: Two times a day (BID) | ORAL | Status: DC | PRN
  Administered 2017-03-15 – 2017-03-18 (×5): 1 mg via ORAL
  Filled 2017-03-15 (×5): qty 1

## 2017-03-15 MED ORDER — LEVOTHYROXINE SODIUM 50 MCG PO TABS
50.0000 ug | ORAL_TABLET | Freq: Every day | ORAL | Status: DC
  Administered 2017-03-16 – 2017-04-01 (×17): 50 ug via ORAL
  Filled 2017-03-15 (×18): qty 1

## 2017-03-15 MED ORDER — LOSARTAN POTASSIUM 50 MG PO TABS
25.0000 mg | ORAL_TABLET | Freq: Every day | ORAL | Status: DC
  Administered 2017-03-15: 25 mg via ORAL
  Filled 2017-03-15: qty 1

## 2017-03-15 MED ORDER — ACETAMINOPHEN 160 MG/5ML PO SOLN: 650.0000 mg | ORAL | Status: DC | PRN

## 2017-03-15 MED ORDER — METOPROLOL TARTRATE 25 MG PO TABS
25.0000 mg | ORAL_TABLET | Freq: Every day | ORAL | Status: DC
  Administered 2017-03-15 – 2017-03-17 (×3): 25 mg via ORAL
  Administered 2017-03-18: 12.5 mg via ORAL
  Filled 2017-03-15 (×4): qty 1

## 2017-03-15 MED ORDER — BUDESONIDE 0.25 MG/2ML IN SUSP
0.2500 mg | Freq: Two times a day (BID) | RESPIRATORY_TRACT | Status: DC
  Administered 2017-03-18 – 2017-03-30 (×12): 0.25 mg via RESPIRATORY_TRACT
  Filled 2017-03-15 (×29): qty 2

## 2017-03-15 MED ORDER — ACETAMINOPHEN 325 MG PO TABS
650.0000 mg | ORAL_TABLET | Freq: Once | ORAL | Status: AC
  Administered 2017-03-15: 650 mg via ORAL

## 2017-03-15 MED ORDER — ENOXAPARIN SODIUM 40 MG/0.4ML ~~LOC~~ SOLN
40.0000 mg | SUBCUTANEOUS | Status: DC
  Administered 2017-03-15: 40 mg via SUBCUTANEOUS
  Filled 2017-03-15: qty 0.4

## 2017-03-15 MED ORDER — TRIAMTERENE-HCTZ 37.5-25 MG PO CAPS
1.0000 | ORAL_CAPSULE | Freq: Every day | ORAL | Status: DC
  Filled 2017-03-15: qty 1

## 2017-03-15 MED ORDER — STROKE: EARLY STAGES OF RECOVERY BOOK
Freq: Once | Status: DC
  Filled 2017-03-15: qty 1

## 2017-03-15 MED ORDER — SIMVASTATIN 40 MG PO TABS
40.0000 mg | ORAL_TABLET | Freq: Every day | ORAL | Status: DC
  Administered 2017-03-16: 40 mg via ORAL
  Filled 2017-03-15: qty 1

## 2017-03-15 MED ORDER — CLOPIDOGREL BISULFATE 75 MG PO TABS
75.0000 mg | ORAL_TABLET | Freq: Every day | ORAL | Status: DC
  Administered 2017-03-15 – 2017-03-25 (×11): 75 mg via ORAL
  Filled 2017-03-15 (×12): qty 1

## 2017-03-15 MED ORDER — HYDROCORTISONE-ACETIC ACID 1-2 % OT SOLN
3.0000 [drp] | Freq: Three times a day (TID) | OTIC | Status: DC
  Administered 2017-03-16: 3 [drp] via OTIC

## 2017-03-15 MED ORDER — SENNOSIDES-DOCUSATE SODIUM 8.6-50 MG PO TABS
1.0000 | ORAL_TABLET | Freq: Every evening | ORAL | Status: DC | PRN
  Administered 2017-03-29 – 2017-03-30 (×2): 1 via ORAL
  Filled 2017-03-15 (×4): qty 1

## 2017-03-15 MED ORDER — ASPIRIN 81 MG PO CHEW
81.0000 mg | CHEWABLE_TABLET | Freq: Every day | ORAL | Status: DC
  Filled 2017-03-15: qty 1

## 2017-03-15 MED ORDER — LORAZEPAM 2 MG/ML IJ SOLN
2.0000 mg | Freq: Once | INTRAMUSCULAR | Status: AC
  Administered 2017-03-15: 2 mg via INTRAVENOUS
  Filled 2017-03-15: qty 1

## 2017-03-15 MED ORDER — SIMVASTATIN 20 MG PO TABS: 20.0000 mg | ORAL_TABLET | Freq: Every day | ORAL | Status: DC

## 2017-03-15 MED ORDER — ACETAMINOPHEN 650 MG RE SUPP: 650.0000 mg | RECTAL | Status: DC | PRN

## 2017-03-15 MED ORDER — FAMOTIDINE 20 MG PO TABS
20.0000 mg | ORAL_TABLET | Freq: Two times a day (BID) | ORAL | Status: DC
  Administered 2017-03-15 – 2017-04-01 (×33): 20 mg via ORAL
  Filled 2017-03-15 (×34): qty 1

## 2017-03-15 MED ORDER — ACETAMINOPHEN 325 MG PO TABS
ORAL_TABLET | ORAL | Status: AC
  Administered 2017-03-15: 650 mg via ORAL
  Filled 2017-03-15: qty 2

## 2017-03-15 MED ORDER — ACETAMINOPHEN 325 MG PO TABS
650.0000 mg | ORAL_TABLET | ORAL | Status: DC | PRN
  Administered 2017-03-16 – 2017-03-25 (×19): 650 mg via ORAL
  Administered 2017-03-26: 325 mg via ORAL
  Administered 2017-03-26 – 2017-04-01 (×14): 650 mg via ORAL
  Filled 2017-03-15 (×34): qty 2

## 2017-03-15 NOTE — ED Triage Notes (Signed)
Arrived ems  C/o feeling like she is going to pass out but denies passing out  2 days ago her left arm had a "convulsion"  Denies pain

## 2017-03-15 NOTE — ED Notes (Signed)
Pt remains on cardiac monitor and auto VS 

## 2017-03-15 NOTE — H&P (Signed)
History and Physical  Kathleen Dunn EXH:[id number] DOB: Mar 02, 1941 DOA: 03/15/2017  Referring physician: Dr Regenia Skeeter, ED physician PCP: Hali Marry, MD  Outpatient Specialists: none  Patient Coming From: home  Chief Complaint: lightheadedness  HPI: Kathleen Dunn is a 76 y.o. female with a history of asthma, hypertension, thyroid disease, anxiety, on Desogen therapy for hormone replacement. Patient was seen at [facility] for near syncope on June 14 and received a head CT which showed no acute intracranial abnormality but evidence of an old small left cerebellar infarct with small vessel ischemic changes in the white matter. Over the past 2 days the patient has had intermittent shaking and "twisting" of her left hand which last for several minutes. She has been feeling lightheaded over the past few days and took Dramamine twice, which preceded the episodes of shaking and twisting. She denies vertigo, chest pain, weakness, sensory changes. Her CT scan today showed worsening areas of low density in the cerebellum consistent with small acute/subacute cerebellar infarctions.   Review of Systems:   Pt denies any fevers, chills, nausea, vomiting, diarrhea, constipation, abdominal pain, shortness of breath, dyspnea on exertion, orthopnea, cough, wheezing, palpitations, headache, vision changes, lightheadedness, dizziness, melena, rectal bleeding.  Review of systems are otherwise negative  Past Medical History:  Diagnosis Date  . Asthma   . Hypertension   . Lung nodule < 6cm on CT   . Thyroid disease    Past Surgical History:  Procedure Laterality Date  . ABDOMINAL HYSTERECTOMY  age 46   oophorectomy  . BREAST SURGERY     benign bx of left breast   Social History:  reports that she quit smoking about 45 years ago. She has never used smokeless tobacco. She reports that she does not drink alcohol or use drugs. Patient lives at [place]  . Codeine  Shortness Of Breath and Other (See Comments)    Chest pain and swelling  . Hydrocodone-Acetaminophen Anaphylaxis  . Morphine Shortness Of Breath and Other (See Comments)    Chest swelling  . Penicillins Rash    Has patient had a PCN reaction causing immediate rash, facial/tongue/throat swelling, SOB or lightheadedness with hypotension: Yes Has patient had a PCN reaction causing severe rash involving mucus membranes or skin necrosis: No Has patient had a PCN reaction that required hospitalization: No Has patient had a PCN reaction occurring within the last 10 years: No If all of the above answers are "NO", then may proceed with Cephalosporin use.  . Albuterol Other (See Comments)    shaking  . Hydroxyzine Pamoate Nausea And Vomiting    Sedating.   . Lipitor [Atorvastatin] Other (See Comments)    Myalgias  . Morphine And Related   . Simvastatin Other (See Comments)    Stomach ache  . Vistaril [Hydroxyzine Hcl] Other (See Comments)    Sedating.   . Erythromycin Rash  . Sulfa Antibiotics Rash  . Sulfonamide Derivatives Rash    Family History  Problem Relation Age of Onset  . Heart attack Father       Prior to Admission medications   Medication Sig Start Date End Date Taking? Authorizing Provider  acetic acid-hydrocortisone (VOSOL-HC) otic solution Place 3 drops into both ears 3 (three) times daily. 06/21/16   Hali Marry, MD  benzonatate (TESSALON) 100 MG capsule Take 1-2 capsules (100-200 mg total) by mouth 3 (three) times daily as needed for cough. 04/01/16   Hali Marry, MD  Cholecalciferol (VITAMIN D3) 1000  units CAPS Take 1 capsule (1,000 Units total) by mouth daily. 11/03/15   Hali Marry, MD  clonazePAM (KLONOPIN) 1 MG tablet Take 1 tablet (1 mg total) by mouth 2 (two) times daily as needed for anxiety. 04/01/16   Hali Marry, MD  DEPO-ESTRADIOL 5 MG/ML injection Inject 1 mL (5 mg total) into the muscle every 28 (twenty-eight) days. 06/21/16    Hali Marry, MD  ketoconazole (NIZORAL) 2 % shampoo APPLY TO AFFECTED AREA TWICE A WEEK 11/03/15   Hali Marry, MD  levothyroxine (SYNTHROID, LEVOTHROID) 50 MCG tablet Take 1 tablet (50 mcg total) by mouth daily. NEED FOLLOW UP VISIT FOR MORE REFILLS 08/06/16   Hali Marry, MD  losartan (COZAAR) 25 MG tablet Take 1 tablet (25 mg total) by mouth daily. NEED FOLLOW UP VISIT FOR MORE REFILLS 06/12/16   Hali Marry, MD  metoprolol tartrate (LOPRESSOR) 25 MG tablet Take 1 tablet (25 mg total) by mouth daily. 11/03/15   Hali Marry, MD  QVAR 40 MCG/ACT inhaler USE 2 INHALATIONS TWICE A DAY 07/22/14   Hali Marry, MD  ranitidine (ZANTAC) 150 MG capsule TAKE 1 CAPSULE TWICE A DAY AS NEEDED FOR HEARTBURN 11/03/15   Hali Marry, MD  Red Yeast Rice Extract (RED YEAST RICE PO) Take by mouth.    [provider]  simvastatin (ZOCOR) 40 MG tablet Take 20 mg by mouth daily.    [provider]  traMADol (ULTRAM) 50 MG tablet Take 1 tablet (50 mg total) by mouth every 12 (twelve) hours as needed (knee pain). 02/21/16   Hali Marry, MD  triamterene-hydrochlorothiazide (DYAZIDE) 37.5-25 MG capsule TAKE 1 CAPSULE DAILY 03/14/16   Hali Marry, MD    Physical Exam: BP 129/61   Pulse 70   Temp 97.8 F (36.6 C) (Oral)   Resp 18   Ht 5\' 1"  (1.549 m)   Wt 86.2 kg (190 lb)   SpO2 95%   BMI 35.90 kg/m   General: Elderly Caucasian female. Awake and alert and oriented x3. No acute cardiopulmonary distress.  HEENT: Normocephalic atraumatic.  Right and left ears normal in appearance.  Pupils equal, round, reactive to light. Extraocular muscles are intact. Sclerae anicteric and noninjected.  Moist mucosal membranes. No mucosal lesions.  Neck: Neck supple without lymphadenopathy. No carotid bruits. No masses palpated.  Cardiovascular: Regular rate with normal S1-S2 sounds. No murmurs, rubs, gallops auscultated. No JVD.    Respiratory: Good respiratory effort with no wheezes, rales, rhonchi. Lungs clear to auscultation bilaterally.  No accessory muscle use. Abdomen: Soft, nontender, nondistended. Active bowel sounds. No masses or hepatosplenomegaly  Skin: No rashes, lesions, or ulcerations.  Dry, warm to touch. 2+ dorsalis pedis and radial pulses. Musculoskeletal: No calf or leg pain. All major joints not erythematous nontender.  No upper or lower joint deformation.  Good ROM.  No contractures  Psychiatric: Intact judgment and insight. Pleasant and cooperative. Neurologic: No focal neurological deficits. Strength is 5/5 and symmetric in upper and lower extremities.  Coordination is intact. Cranial nerves II through XII are grossly intact.           Labs on Admission: I have personally reviewed following labs and imaging studies  CBC:  Recent Labs Lab 03/15/17 0753  WBC 8.4  NEUTROABS 4.5  HGB 14.9  HCT 42.6  MCV 92.2  PLT 607   Basic Metabolic Panel:  Recent Labs Lab 03/15/17 0753  NA 137  K 4.1  CL  101  CO2 28  GLUCOSE 116*  BUN 14  CREATININE 0.79  CALCIUM 9.5  MG 1.7   GFR: Estimated Creatinine Clearance: 61.6 mL/min (by C-G formula based on SCr of 0.79 mg/dL). Liver Function Tests:  Recent Labs Lab 03/15/17 0753  AST 21  ALT 18  ALKPHOS 43  BILITOT 0.4  PROT 6.8  ALBUMIN 3.7   No results for input(s): LIPASE, AMYLASE in the last 168 hours. No results for input(s): AMMONIA in the last 168 hours. Coagulation Profile: No results for input(s): INR, PROTIME in the last 168 hours. Cardiac Enzymes: No results for input(s): CKTOTAL, CKMB, CKMBINDEX, TROPONINI in the last 168 hours. BNP (last 3 results) No results for input(s): PROBNP in the last 8760 hours. HbA1C: No results for input(s): HGBA1C in the last 72 hours. CBG: No results for input(s): GLUCAP in the last 168 hours. Lipid Profile: No results for input(s): CHOL, HDL, LDLCALC, TRIG, CHOLHDL, LDLDIRECT in the last  72 hours. Thyroid Function Tests: No results for input(s): TSH, T4TOTAL, FREET4, T3FREE, THYROIDAB in the last 72 hours. Anemia Panel: No results for input(s): VITAMINB12, FOLATE, FERRITIN, TIBC, IRON, RETICCTPCT in the last 72 hours. Urine analysis:    Component Value Date/Time   COLORURINE YELLOW 03/15/2017 0825   APPEARANCEUR CLEAR 03/15/2017 0825   LABSPEC 1.010 03/15/2017 0825   PHURINE 6.5 03/15/2017 0825   GLUCOSEU NEGATIVE 03/15/2017 0825   HGBUR NEGATIVE 03/15/2017 0825   BILIRUBINUR NEGATIVE 03/15/2017 0825   BILIRUBINUR neg 02/24/2013 1601   KETONESUR NEGATIVE 03/15/2017 0825   PROTEINUR NEGATIVE 03/15/2017 0825   UROBILINOGEN 0.2 02/24/2013 1601   NITRITE NEGATIVE 03/15/2017 0825   LEUKOCYTESUR NEGATIVE 03/15/2017 0825   Sepsis Labs: @LABRCNTIP (procalcitonin:4,lacticidven:4) )No results found for this or any previous visit (from the past 240 hour(s)).   Radiological Exams on Admission: Dg Lumbar Spine Complete  Result Date: 03/15/2017 CLINICAL DATA:  Pt complains of lower back pain x 5 days with fall, near syncopal episode and dizziness x 2 days EXAM: LUMBAR SPINE - COMPLETE 4+ VIEW COMPARISON:  None. FINDINGS: No fracture.  No spondylolisthesis. Moderate to marked loss of disc height at L1-L2 with endplate sclerosis and osteophytes. Mild loss disc height at T12-L1 and L3-L4. There facet degenerative changes most evident in the lower lumbar spine. Dense aortic vascular calcifications are noted. No evidence of an aneurysm. There are other scattered vascular calcifications. Soft tissues otherwise unremarkable. IMPRESSION: 1. No fracture or acute finding. 2. Degenerative changes as detailed. Electronically Signed   By: Lajean Manes M.D.   On: 03/15/2017 08:26   Ct Head Wo Contrast  Result Date: 03/15/2017 CLINICAL DATA:  Near syncope.  Left arm shaking. EXAM: CT HEAD WITHOUT CONTRAST TECHNIQUE: Contiguous axial images were obtained from the base of the skull through the  vertex without intravenous contrast. COMPARISON:  02/27/2017 FINDINGS: Brain: Worsening of areas of low-density in the cerebellar hemispheres, more notable on the right, consistent with subacute cerebellar infarctions. No sign of hemorrhage or swelling. Chronic small-vessel ischemic changes affect the pons. Cerebral hemispheres show chronic small-vessel ischemic changes of the white matter. No cortical supratentorial infarction identified. No mass lesion, hemorrhage, hydrocephalus or extra-axial collection. Vascular: There is atherosclerotic calcification of the major vessels at the base of the brain. Skull: Negative Sinuses/Orbits: Clear/normal Other: None significant IMPRESSION: Worsening of areas of low-density in the cerebellum consistent with small acute/ subacute cerebellar infarctions. No sign of hemorrhage or mass effect. Electronically Signed   By: Nelson Chimes M.D.   On:  03/15/2017 08:11    EKG: Independently reviewed. Sinus rhythm with low voltage in the precordial leads. No acute ST elevation or depression. No significant change from prior.  Assessment/Plan: Principal Problem:   Cerebellar stroke (HCC) Active Problems:   Hypothyroidism   HYPERCHOLESTEROLEMIA   HYPERTENSION, BENIGN SYSTEMIC   GAD (generalized anxiety disorder)    This patient was discussed with the ED physician, including pertinent vitals, physical exam findings, labs, and imaging.  We also discussed care given by the ED provider.  #1 cerebellar stroke  Observation on telemetry  Appears subacute - no definitive deficits on my exam. At this point, it appears that this may be subacute. Question of whether allowing permissive hypertension would be beneficial. Will allow neurology to weigh in on this.  MRI/MRA head  Carotid Dopplers   Echocardiogram tomorrow  Hemoglobin A1c, lipid panel in the morning  PT/OT/speech therapy consult  Full aspirin #2 hypertension  Continued antihypertensives for now #3  hyperlipidemia  Continue Zocor - will maximize dose #4 hypothyroidism  Continue Synthroid #5 anxiety  We'll continue antianxiety medications.  DVT prophylaxis: Lovenox Consultants: Neurology Code Status: Full code Family Communication: None  Disposition Plan: Observation with Telemetry monitoring   Truett Mainland DO [facility] Pager [phone]  If 7PM-7AM, please contact night-coverage [email or web address] Password [email or web address]

## 2017-03-15 NOTE — ED Notes (Signed)
Patient denies pain and is resting comfortably.  

## 2017-03-15 NOTE — ED Notes (Addendum)
ED Provider at bedside. Pt on cardiac monitor and auto VS 

## 2017-03-15 NOTE — ED Notes (Signed)
Patient transported to CT 

## 2017-03-15 NOTE — ED Provider Notes (Signed)
[facility] Provider Note   CSN: [id number] Arrival date & time: 03/15/17  0712     History   Chief Complaint Chief Complaint  Patient presents with  . Near Syncope    HPI Kathleen Dunn is a 76 y.o. female.  HPI  76 year old female presents with a chief complaint of weakness and left hand shaking. She states that she was seen here on June 14 for near-syncope. However symptoms today are different. Over the last 2 days she's been having intermittent shaking and "twisting" of her left hand. She states it lasts for minutes but less than 10 minutes. No obvious preceding symptoms. No other signs of shaking or twisting in her other extremities. She's been feeling diffusely weak during this time. Since yesterday she's had some intermittent headaches. No neck pain. No nausea or vomiting. Denies chest pain or abdominal pain. She has had some left lower back pain for the last 3 days. No prior history of seizures. She talked to a friend who is a doctor who told her she needs an EEG. She has not had a fever during this time. She took a Dramamine for nausea a little before this started but has not taken any since. She feels off-balance like she needs to hold on to something while walking over the last 2 days. She has also been having intermittent trouble speaking and trouble hearing. These come and go. She's not sure if it's trouble getting the right word out or slurred speech. She thinks these might be coming on at the same time as her hand shaking.  Past Medical History:  Diagnosis Date  . Asthma   . Hypertension   . Lung nodule < 6cm on CT   . Thyroid disease     Patient Active Problem List   Diagnosis Date Noted  . 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 GAD (generalized anxiety disorder) 11/06/2015  . Coronary artery calcification 02/17/2015  . IFG (impaired fasting glucose) 01/12/2015  . Lung nodule < 6cm on CT 05/20/2014  . Obesity (BMI 30.0-34.9) 08/26/2013  . Chronic  fatigue 02/18/2013  . DEPRESSION 01/08/2008  . FATIGUE 01/08/2008  . Hypothyroidism 12/04/2007  . HYPONATREMIA 12/04/2007  . ABNORMAL MAMMOGRAM 08/25/2007  . HYPERCHOLESTEROLEMIA 06/24/2006  . PERNICIOUS ANEMIA 06/24/2006  . HYPERTENSION, BENIGN SYSTEMIC 06/24/2006  . LOW BACK PAIN 06/24/2006  . TREMOR 06/24/2006    Past Surgical History:  Procedure Laterality Date  . ABDOMINAL HYSTERECTOMY  age 77   oophorectomy  . BREAST SURGERY     benign bx of left breast    OB History    No data available       Home Medications    Prior to Admission medications   Medication Sig Start Date End Date Taking? Authorizing Provider  acetic acid-hydrocortisone (VOSOL-HC) otic solution Place 3 drops into both ears 3 (three) times daily. 06/21/16   Hali Marry, MD  benzonatate (TESSALON) 100 MG capsule Take 1-2 capsules (100-200 mg total) by mouth 3 (three) times daily as needed for cough. 04/01/16   Hali Marry, MD  Cholecalciferol (VITAMIN D3) 1000 units CAPS Take 1 capsule (1,000 Units total) by mouth daily. 11/03/15   Hali Marry, MD  clonazePAM (KLONOPIN) 1 MG tablet Take 1 tablet (1 mg total) by mouth 2 (two) times daily as needed for anxiety. 04/01/16   Hali Marry, MD  DEPO-ESTRADIOL 5 MG/ML injection Inject 1 mL (5 mg total) into the muscle every 28 (twenty-eight) days. 06/21/16  Hali Marry, MD  ketoconazole (NIZORAL) 2 % shampoo APPLY TO AFFECTED AREA TWICE A WEEK 11/03/15   Hali Marry, MD  levothyroxine (SYNTHROID, LEVOTHROID) 50 MCG tablet Take 1 tablet (50 mcg total) by mouth daily. NEED FOLLOW UP VISIT FOR MORE REFILLS 08/06/16   Hali Marry, MD  losartan (COZAAR) 25 MG tablet Take 1 tablet (25 mg total) by mouth daily. NEED FOLLOW UP VISIT FOR MORE REFILLS 06/12/16   Hali Marry, MD  metoprolol tartrate (LOPRESSOR) 25 MG tablet Take 1 tablet (25 mg total) by mouth daily. 11/03/15   Hali Marry, MD    QVAR 40 MCG/ACT inhaler USE 2 INHALATIONS TWICE A DAY 07/22/14   Hali Marry, MD  ranitidine (ZANTAC) 150 MG capsule TAKE 1 CAPSULE TWICE A DAY AS NEEDED FOR HEARTBURN 11/03/15   Hali Marry, MD  Red Yeast Rice Extract (RED YEAST RICE PO) Take by mouth.    [provider]  simvastatin (ZOCOR) 40 MG tablet Take 20 mg by mouth daily.    [provider]  traMADol (ULTRAM) 50 MG tablet Take 1 tablet (50 mg total) by mouth every 12 (twelve) hours as needed (knee pain). 02/21/16   Hali Marry, MD  triamterene-hydrochlorothiazide (DYAZIDE) 37.5-25 MG capsule TAKE 1 CAPSULE DAILY 03/14/16   Hali Marry, MD    Family History Family History  Problem Relation Age of Onset  . Heart attack Father     Social History Social History  Substance Use Topics  . Smoking status: Former Smoker    Quit date: 09/17/1971  . Smokeless tobacco: Never Used  . Alcohol use No     Allergies   Albuterol; Codeine; Erythromycin; Lipitor [atorvastatin]; Morphine and related; Penicillamine; Simvastatin; Sulfonamide derivatives; and Vistaril [hydroxyzine hcl]   Review of Systems Review of Systems  Constitutional: Negative for fever.  Respiratory: Negative for shortness of breath.   Cardiovascular: Negative for chest pain.  Gastrointestinal: Negative for abdominal pain and vomiting.  Musculoskeletal: Positive for back pain (left low back).  Neurological: Positive for speech difficulty, weakness and headaches. Negative for dizziness and numbness.  All other systems reviewed and are negative.    Physical Exam Updated Vital Signs BP (!) 154/88   Pulse 61   Temp 97.2 F (36.2 C)   Resp 14   Ht 5\' 1"  (1.549 m)   Wt 86.2 kg (190 lb)   SpO2 98%   BMI 35.90 kg/m   Physical Exam  Constitutional: She is oriented to person, place, and time. She appears well-developed and well-nourished. No distress.  HENT:  Head: Normocephalic and atraumatic.  Right Ear:  External ear normal.  Left Ear: External ear normal.  Nose: Nose normal.  Eyes: EOM are normal. Pupils are equal, round, and reactive to light. Right eye exhibits no discharge. Left eye exhibits no discharge.  Neck: Normal range of motion. Neck supple.  Cardiovascular: Normal rate, regular rhythm and normal heart sounds.   No murmur heard. Pulmonary/Chest: Effort normal and breath sounds normal. She has no wheezes. She has no rales.  Abdominal: Soft. There is no tenderness.  Musculoskeletal:       Lumbar back: She exhibits tenderness (just left of midline of lower lumbar back).       Back:  Neurological: She is alert and oriented to person, place, and time.  CN 3-12 grossly intact. 5/5 strength in all 4 extremities. Grossly normal sensation. Normal finger to nose.   Skin: Skin is warm and  dry. She is not diaphoretic.  Nursing note and vitals reviewed.    ED Treatments / Results  Labs (all labs ordered are listed, but only abnormal results are displayed) Labs Reviewed  COMPREHENSIVE METABOLIC PANEL - Abnormal; Notable for the following:       Result Value   Glucose, Bld 116 (*)    All other components within normal limits  CBC WITH DIFFERENTIAL/PLATELET  MAGNESIUM  RAPID URINE DRUG SCREEN, HOSP PERFORMED  URINALYSIS, ROUTINE W REFLEX MICROSCOPIC    EKG  EKG Interpretation  Date/Time:  Saturday March 15 2017 07:29:27 EDT Ventricular Rate:  60 PR Interval:    QRS Duration: 88 QT Interval:  424 QTC Calculation: 424 R Axis:   66 Text Interpretation:  Sinus rhythm Low voltage, precordial leads Borderline T abnormalities, lateral leads no significant change compared to February 27 2017 Confirmed by Sherwood Gambler [id number]) on 03/15/2017 7:42:43 AM       Radiology Dg Lumbar Spine Complete  Result Date: 03/15/2017 CLINICAL DATA:  Pt complains of lower back pain x 5 days with fall, near syncopal episode and dizziness x 2 days EXAM: LUMBAR SPINE - COMPLETE 4+ VIEW COMPARISON:  None.  FINDINGS: No fracture.  No spondylolisthesis. Moderate to marked loss of disc height at L1-L2 with endplate sclerosis and osteophytes. Mild loss disc height at T12-L1 and L3-L4. There facet degenerative changes most evident in the lower lumbar spine. Dense aortic vascular calcifications are noted. No evidence of an aneurysm. There are other scattered vascular calcifications. Soft tissues otherwise unremarkable. IMPRESSION: 1. No fracture or acute finding. 2. Degenerative changes as detailed. Electronically Signed   By: Lajean Manes M.D.   On: 03/15/2017 08:26   Ct Head Wo Contrast  Result Date: 03/15/2017 CLINICAL DATA:  Near syncope.  Left arm shaking. EXAM: CT HEAD WITHOUT CONTRAST TECHNIQUE: Contiguous axial images were obtained from the base of the skull through the vertex without intravenous contrast. COMPARISON:  02/27/2017 FINDINGS: Brain: Worsening of areas of low-density in the cerebellar hemispheres, more notable on the right, consistent with subacute cerebellar infarctions. No sign of hemorrhage or swelling. Chronic small-vessel ischemic changes affect the pons. Cerebral hemispheres show chronic small-vessel ischemic changes of the white matter. No cortical supratentorial infarction identified. No mass lesion, hemorrhage, hydrocephalus or extra-axial collection. Vascular: There is atherosclerotic calcification of the major vessels at the base of the brain. Skull: Negative Sinuses/Orbits: Clear/normal Other: None significant IMPRESSION: Worsening of areas of low-density in the cerebellum consistent with small acute/ subacute cerebellar infarctions. No sign of hemorrhage or mass effect. Electronically Signed   By: Nelson Chimes M.D.   On: 03/15/2017 08:11    Procedures Procedures (including critical care time)  Medications Ordered in ED Medications - No data to display   Initial Impression / Assessment and Plan / ED Course  I have reviewed the triage vital signs and the nursing  notes.  Pertinent labs & imaging results that were available during my care of the patient were reviewed by me and considered in my medical decision making (see chart for details).     No seizure-like activity in the ED. However her history is concerning for a partial seizure involving her left hand. CT shows what appears to be a developing cerebellar infarct. This would possibly explain why she's been feeling off balanced over the last few days. Given these findings, she will need neurologic consultation and likely EEG and MRI. Discussed with neurology on call, Dr. Orlena Sheldon, who will see patient at [facility] (  needs EEG). Dr. Nehemiah Settle accepts for hospitalist admission and transfer.   Final Clinical Impressions(s) / ED Diagnoses   Final diagnoses:  Cerebellar stroke [facility])    New Prescriptions New Prescriptions   No medications on file     Sherwood Gambler, MD 03/15/17 [phone]

## 2017-03-15 NOTE — Progress Notes (Addendum)
MRI and MRA of brain completed, revealing multiple bilateral subacute cerebellar infarctions and left occipital lobe infarction, as well as severe stenosis of the basilar artery at the location of prominent arterial calcification which was seen on CT head performed earlier today.   The cerebellar infarctions are roughly bilaterally symmetric and may represent watershed infarctions based upon their locations as correlated with images of cerebellar watershed infarctions from a recent (2015) article in the literature. Hypoperfusion secondary to the severe focal atherosclerotic basilar artery stenosis seen on MRA is one possible mechanism. In situ thrombosis of ruptured plaque followed by distal embolization would be a better explanation for the left occipital lobe acute infarction also seen on imaging.   A/R: 1. Adding Plavix to ASA (first dose now). 2. Continue simvastatin. 3. CTA of head to further assess the stenosis at higher resolution (ordered). 4. CTA of neck (ordered).  5. TTE.  6. Endovascular Radiology consult for consideration of cerebral arteriogram and possible basilar artery stenting next week.  7. PT/OT.  Electronically signed: Dr. Kerney Elbe

## 2017-03-15 NOTE — Consult Note (Addendum)
Reason for Consult:  Seizure/stroke Referring Physician:Dr. Glorian Mcdonell is an 76 y.o. female.  HPI: Transferred from outside ER.  She presented with 2 episodes of left arm twisting and uncontrollable movements.  She is a poor historian and cannot demonstrate what happened or corroborate if movements were rhythmic or not.  It lasted a few minutes.  Each time it happened after taking Dramamine for dizziness and imbalance.  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  She has a history of HTN.  No DM, smoking.  She is on a statin.    Past Medical History:  Diagnosis Date  . Asthma   . Hypertension   . Lung nodule < 6cm on CT   . Thyroid disease     Past Surgical History:  Procedure Laterality Date  . ABDOMINAL HYSTERECTOMY  age 45   oophorectomy  . BREAST SURGERY     benign bx of left breast    Family History  Problem Relation Age of Onset  . Heart attack Father     Social History:  reports that she quit smoking about 45 years ago. She has never used smokeless tobacco. She reports that she does not drink alcohol or use drugs.  Allergies:  Allergies  Allergen Reactions  . Codeine Shortness Of Breath and Other (See Comments)    Chest pain and swelling  . Hydrocodone-Acetaminophen Anaphylaxis  . Morphine Shortness Of Breath and Other (See Comments)    Chest swelling  . Penicillins Rash    Has patient had a PCN reaction causing immediate rash, facial/tongue/throat swelling, SOB or lightheadedness with hypotension: Yes Has patient had a PCN reaction causing severe rash involving mucus membranes or skin necrosis: No Has patient had a PCN reaction that required hospitalization: No Has patient had a PCN reaction occurring within the last 10 years: No If all of the above answers are "NO", then may proceed with Cephalosporin use.  . Albuterol Other (See Comments)    shaking   . Hydroxyzine Pamoate Nausea And Vomiting    Sedating.   . Lipitor [Atorvastatin] Other (See Comments)    Myalgias  . Morphine And Related   . Simvastatin Other (See Comments)    Stomach ache  . Vistaril [Hydroxyzine Hcl] Other (See Comments)    Sedating.   . Erythromycin Rash  . Sulfa Antibiotics Rash  . Sulfonamide Derivatives Rash    Prior to Admission medications   Medication Sig Start Date End Date Taking? Authorizing Provider  acetic acid-hydrocortisone (VOSOL-HC) otic solution Place 3 drops into both ears 3 (three) times daily. 06/21/16   Hali Marry, MD  benzonatate (TESSALON) 100 MG capsule Take 1-2 capsules (100-200 mg total) by mouth 3 (three) times daily as needed for cough. 04/01/16   Hali Marry, MD  Cholecalciferol (VITAMIN D3) 1000 units CAPS Take 1 capsule (1,000 Units total) by mouth daily. 11/03/15   Hali Marry, MD  clonazePAM (KLONOPIN) 1 MG tablet Take 1 tablet (1 mg total) by mouth 2 (two) times daily as needed for anxiety. 04/01/16   Hali Marry, MD  DEPO-ESTRADIOL 5 MG/ML injection Inject 1 mL (5 mg total) into the muscle every 28 (twenty-eight) days. 06/21/16   Hali Marry, MD  ketoconazole (NIZORAL) 2 % shampoo APPLY TO AFFECTED AREA TWICE A WEEK 11/03/15   Hali Marry, MD  levothyroxine (SYNTHROID, LEVOTHROID) 50 MCG tablet  Take 1 tablet (50 mcg total) by mouth daily. NEED FOLLOW UP VISIT FOR MORE REFILLS 08/06/16   Hali Marry, MD  losartan (COZAAR) 25 MG tablet Take 1 tablet (25 mg total) by mouth daily. NEED FOLLOW UP VISIT FOR MORE REFILLS 06/12/16   Hali Marry, MD  metoprolol tartrate (LOPRESSOR) 25 MG tablet Take 1 tablet (25 mg total) by mouth daily. 11/03/15   Hali Marry, MD  QVAR 40 MCG/ACT inhaler USE 2 INHALATIONS TWICE A DAY 07/22/14   Hali Marry, MD  ranitidine (ZANTAC) 150 MG capsule TAKE 1 CAPSULE TWICE A DAY AS NEEDED FOR HEARTBURN 11/03/15   Hali Marry, MD  Red Yeast Rice Extract (RED YEAST RICE PO) Take by mouth.    [provider]  simvastatin (ZOCOR) 40 MG tablet Take 20 mg by mouth daily.    [provider]  traMADol (ULTRAM) 50 MG tablet Take 1 tablet (50 mg total) by mouth every 12 (twelve) hours as needed (knee pain). 02/21/16   Hali Marry, MD  triamterene-hydrochlorothiazide (DYAZIDE) 37.5-25 MG capsule TAKE 1 CAPSULE DAILY 03/14/16   Hali Marry, MD    Medications:  Scheduled: .  stroke: mapping our early stages of recovery book   Does not apply Once  . acetic acid-hydrocortisone  3 drop Both EARS TID  . budesonide (PULMICORT) nebulizer solution  0.25 mg Nebulization BID  . enoxaparin (LOVENOX) injection  40 mg Subcutaneous Q24H  . famotidine  20 mg Oral BID  . levothyroxine  50 mcg Oral Daily  . LORazepam  2 mg Intravenous Once  . losartan  25 mg Oral Daily  . metoprolol tartrate  25 mg Oral Daily  . [START ON 03/16/2017] simvastatin  40 mg Oral Daily  . triamterene-hydrochlorothiazide  1 capsule Oral Daily    Results for orders placed or performed during the hospital encounter of 03/15/17 (from the past 48 hour(s))  CBC WITH DIFFERENTIAL     Status: None   Collection Time: 03/15/17  7:53 AM  Result Value Ref Range   WBC 8.4 4.0 - 10.5 K/uL   RBC 4.62 3.87 - 5.11 MIL/uL   Hemoglobin 14.9 12.0 - 15.0 g/dL   HCT 42.6 36.0 - 46.0 %   MCV 92.2 78.0 - 100.0 fL   MCH 32.3 26.0 - 34.0 pg   MCHC 35.0 30.0 - 36.0 g/dL   RDW 13.2 11.5 - 15.5 %   Platelets 261 150 - 400 K/uL   Neutrophils Relative % 53 %   Neutro Abs 4.5 1.7 - 7.7 K/uL   Lymphocytes Relative 31 %   Lymphs Abs 2.6 0.7 - 4.0 K/uL   Monocytes Relative 10 %   Monocytes Absolute 0.9 0.1 - 1.0 K/uL   Eosinophils Relative 5 %   Eosinophils Absolute 0.4 0.0 - 0.7 K/uL   Basophils Relative 1 %   Basophils Absolute 0.0 0.0 - 0.1 K/uL  Comprehensive metabolic panel     Status: Abnormal   Collection Time: 03/15/17  7:53  AM  Result Value Ref Range   Sodium 137 135 - 145 mmol/L   Potassium 4.1 3.5 - 5.1 mmol/L   Chloride 101 101 - 111 mmol/L   CO2 28 22 - 32 mmol/L   Glucose, Bld 116 (H) 65 - 99 mg/dL   BUN 14 6 - 20 mg/dL   Creatinine, Ser 0.79 0.44 - 1.00 mg/dL   Calcium 9.5 8.9 - 10.3 mg/dL   Total Protein 6.8 6.5 -  8.1 g/dL   Albumin 3.7 3.5 - 5.0 g/dL   AST 21 15 - 41 U/L   ALT 18 14 - 54 U/L   Alkaline Phosphatase 43 38 - 126 U/L   Total Bilirubin 0.4 0.3 - 1.2 mg/dL   GFR calc non Af Amer >60 >60 mL/min   GFR calc Af Amer >60 >60 mL/min    Comment: (NOTE) The eGFR has been calculated using the CKD EPI equation. This calculation has not been validated in all clinical situations. eGFR's persistently <60 mL/min signify possible Chronic Kidney Disease.    Anion gap 8 5 - 15  Magnesium     Status: None   Collection Time: 03/15/17  7:53 AM  Result Value Ref Range   Magnesium 1.7 1.7 - 2.4 mg/dL  Urine rapid drug screen (hosp performed)     Status: None   Collection Time: 03/15/17  8:25 AM  Result Value Ref Range   Opiates NONE DETECTED NONE DETECTED   Cocaine NONE DETECTED NONE DETECTED   Benzodiazepines NONE DETECTED NONE DETECTED   Amphetamines NONE DETECTED NONE DETECTED   Tetrahydrocannabinol NONE DETECTED NONE DETECTED   Barbiturates NONE DETECTED NONE DETECTED    Comment:        DRUG SCREEN FOR MEDICAL PURPOSES ONLY.  IF CONFIRMATION IS NEEDED FOR ANY PURPOSE, NOTIFY LAB WITHIN 5 DAYS.        LOWEST DETECTABLE LIMITS FOR URINE DRUG SCREEN Drug Class       Cutoff (ng/mL) Amphetamine      1000 Barbiturate      200 Benzodiazepine   924 Tricyclics       268 Opiates          300 Cocaine          300 THC              50   Urinalysis, Routine w reflex microscopic     Status: None   Collection Time: 03/15/17  8:25 AM  Result Value Ref Range   Color, Urine YELLOW YELLOW   APPearance CLEAR CLEAR   Specific Gravity, Urine 1.010 1.005 - 1.030   pH 6.5 5.0 - 8.0   Glucose, UA  NEGATIVE NEGATIVE mg/dL   Hgb urine dipstick NEGATIVE NEGATIVE   Bilirubin Urine NEGATIVE NEGATIVE   Ketones, ur NEGATIVE NEGATIVE mg/dL   Protein, ur NEGATIVE NEGATIVE mg/dL   Nitrite NEGATIVE NEGATIVE   Leukocytes, UA NEGATIVE NEGATIVE    Comment: Microscopic not done on urines with negative protein, blood, leukocytes, nitrite, or glucose < 500 mg/dL.    Dg Lumbar Spine Complete  Result Date: 03/15/2017 CLINICAL DATA:  Pt complains of lower back pain x 5 days with fall, near syncopal episode and dizziness x 2 days EXAM: LUMBAR SPINE - COMPLETE 4+ VIEW COMPARISON:  None. FINDINGS: No fracture.  No spondylolisthesis. Moderate to marked loss of disc height at L1-L2 with endplate sclerosis and osteophytes. Mild loss disc height at T12-L1 and L3-L4. There facet degenerative changes most evident in the lower lumbar spine. Dense aortic vascular calcifications are noted. No evidence of an aneurysm. There are other scattered vascular calcifications. Soft tissues otherwise unremarkable. IMPRESSION: 1. No fracture or acute finding. 2. Degenerative changes as detailed. Electronically Signed   By: Lajean Manes M.D.   On: 03/15/2017 08:26   Ct Head Wo Contrast  Result Date: 03/15/2017 CLINICAL DATA:  Near syncope.  Left arm shaking. EXAM: CT HEAD WITHOUT CONTRAST TECHNIQUE: Contiguous axial images were obtained from the  base of the skull through the vertex without intravenous contrast. COMPARISON:  02/27/2017 FINDINGS: Brain: Worsening of areas of low-density in the cerebellar hemispheres, more notable on the right, consistent with subacute cerebellar infarctions. No sign of hemorrhage or swelling. Chronic small-vessel ischemic changes affect the pons. Cerebral hemispheres show chronic small-vessel ischemic changes of the white matter. No cortical supratentorial infarction identified. No mass lesion, hemorrhage, hydrocephalus or extra-axial collection. Vascular: There is atherosclerotic calcification of the  major vessels at the base of the brain. Skull: Negative Sinuses/Orbits: Clear/normal Other: None significant IMPRESSION: Worsening of areas of low-density in the cerebellum consistent with small acute/ subacute cerebellar infarctions. No sign of hemorrhage or mass effect. Electronically Signed   By: Nelson Chimes M.D.   On: 03/15/2017 08:11    ROS Blood pressure 129/61, pulse 70, temperature 97.8 F (36.6 C), temperature source Oral, resp. rate 18, height '5\' 1"'$  (1.549 m), weight 86.2 kg (190 lb), SpO2 95 %. Neurologic Examination:  Awake, alert, fully oriented.   Language - intact. Moderate 10 Hz essential tremors in both hands.   No resting tremor.   No weakness. No sensory disturbances. Finger to nose, heel to shin, and RAM are intact bilaterally. Gait- normal.    Assessment/Plan:  1. ?left arm focal motor seizures vs dystonic reaction to Dramamine - EEG. 2. ?subacute right cerebellar infarct- may have happened 2 weeks ago and currently has no symptoms of cerebellar dysfunction - MRI / MRA  Brain pending.  Will start ASA for secondary stroke prevention.   3. Essential tremor- will consider increasing the Metoprolol to get better control while here.    Rogue Jury, MD 03/15/2017, 6:51 PM

## 2017-03-15 NOTE — ED Notes (Addendum)
Gait slightly unsteady, assisted up to BR,  tol well. States she thinks taking dramamine has been causing her to have shaking to her arms . Last dose was 2 days ago

## 2017-03-16 ENCOUNTER — Observation Stay (HOSPITAL_BASED_OUTPATIENT_CLINIC_OR_DEPARTMENT_OTHER): Payer: Medicare Other

## 2017-03-16 ENCOUNTER — Encounter (HOSPITAL_COMMUNITY): Payer: Self-pay | Admitting: General Surgery

## 2017-03-16 DIAGNOSIS — R41841 Cognitive communication deficit: Secondary | ICD-10-CM | POA: Diagnosis not present

## 2017-03-16 DIAGNOSIS — Z87891 Personal history of nicotine dependence: Secondary | ICD-10-CM | POA: Diagnosis not present

## 2017-03-16 DIAGNOSIS — M1711 Unilateral primary osteoarthritis, right knee: Secondary | ICD-10-CM | POA: Diagnosis not present

## 2017-03-16 DIAGNOSIS — E7800 Pure hypercholesterolemia, unspecified: Secondary | ICD-10-CM

## 2017-03-16 DIAGNOSIS — I63231 Cerebral infarction due to unspecified occlusion or stenosis of right carotid arteries: Secondary | ICD-10-CM | POA: Diagnosis not present

## 2017-03-16 DIAGNOSIS — I688 Other cerebrovascular disorders in diseases classified elsewhere: Secondary | ICD-10-CM | POA: Diagnosis not present

## 2017-03-16 DIAGNOSIS — G464 Cerebellar stroke syndrome: Secondary | ICD-10-CM | POA: Diagnosis not present

## 2017-03-16 DIAGNOSIS — G8190 Hemiplegia, unspecified affecting unspecified side: Secondary | ICD-10-CM | POA: Diagnosis not present

## 2017-03-16 DIAGNOSIS — R55 Syncope and collapse: Secondary | ICD-10-CM | POA: Diagnosis not present

## 2017-03-16 DIAGNOSIS — G250 Essential tremor: Secondary | ICD-10-CM | POA: Diagnosis present

## 2017-03-16 DIAGNOSIS — I639 Cerebral infarction, unspecified: Secondary | ICD-10-CM | POA: Diagnosis not present

## 2017-03-16 DIAGNOSIS — R0982 Postnasal drip: Secondary | ICD-10-CM | POA: Diagnosis not present

## 2017-03-16 DIAGNOSIS — I771 Stricture of artery: Secondary | ICD-10-CM | POA: Diagnosis present

## 2017-03-16 DIAGNOSIS — I10 Essential (primary) hypertension: Secondary | ICD-10-CM | POA: Diagnosis not present

## 2017-03-16 DIAGNOSIS — I658 Occlusion and stenosis of other precerebral arteries: Secondary | ICD-10-CM | POA: Diagnosis not present

## 2017-03-16 DIAGNOSIS — M6281 Muscle weakness (generalized): Secondary | ICD-10-CM | POA: Diagnosis not present

## 2017-03-16 DIAGNOSIS — R402363 Coma scale, best motor response, obeys commands, at hospital admission: Secondary | ICD-10-CM | POA: Diagnosis present

## 2017-03-16 DIAGNOSIS — R278 Other lack of coordination: Secondary | ICD-10-CM | POA: Diagnosis not present

## 2017-03-16 DIAGNOSIS — I5030 Unspecified diastolic (congestive) heart failure: Secondary | ICD-10-CM

## 2017-03-16 DIAGNOSIS — I6521 Occlusion and stenosis of right carotid artery: Secondary | ICD-10-CM | POA: Diagnosis not present

## 2017-03-16 DIAGNOSIS — F411 Generalized anxiety disorder: Secondary | ICD-10-CM

## 2017-03-16 DIAGNOSIS — R402253 Coma scale, best verbal response, oriented, at hospital admission: Secondary | ICD-10-CM | POA: Diagnosis present

## 2017-03-16 DIAGNOSIS — Z8673 Personal history of transient ischemic attack (TIA), and cerebral infarction without residual deficits: Secondary | ICD-10-CM | POA: Diagnosis not present

## 2017-03-16 DIAGNOSIS — R0689 Other abnormalities of breathing: Secondary | ICD-10-CM | POA: Diagnosis not present

## 2017-03-16 DIAGNOSIS — I6309 Cerebral infarction due to thrombosis of other precerebral artery: Secondary | ICD-10-CM | POA: Diagnosis not present

## 2017-03-16 DIAGNOSIS — R51 Headache: Secondary | ICD-10-CM | POA: Diagnosis present

## 2017-03-16 DIAGNOSIS — E039 Hypothyroidism, unspecified: Secondary | ICD-10-CM

## 2017-03-16 DIAGNOSIS — F329 Major depressive disorder, single episode, unspecified: Secondary | ICD-10-CM | POA: Diagnosis not present

## 2017-03-16 DIAGNOSIS — I6523 Occlusion and stenosis of bilateral carotid arteries: Secondary | ICD-10-CM | POA: Diagnosis not present

## 2017-03-16 DIAGNOSIS — E669 Obesity, unspecified: Secondary | ICD-10-CM | POA: Diagnosis not present

## 2017-03-16 DIAGNOSIS — I651 Occlusion and stenosis of basilar artery: Secondary | ICD-10-CM | POA: Diagnosis not present

## 2017-03-16 DIAGNOSIS — I739 Peripheral vascular disease, unspecified: Secondary | ICD-10-CM | POA: Diagnosis present

## 2017-03-16 DIAGNOSIS — E782 Mixed hyperlipidemia: Secondary | ICD-10-CM | POA: Diagnosis not present

## 2017-03-16 DIAGNOSIS — I672 Cerebral atherosclerosis: Secondary | ICD-10-CM | POA: Diagnosis present

## 2017-03-16 DIAGNOSIS — R402143 Coma scale, eyes open, spontaneous, at hospital admission: Secondary | ICD-10-CM | POA: Diagnosis present

## 2017-03-16 DIAGNOSIS — K5900 Constipation, unspecified: Secondary | ICD-10-CM | POA: Diagnosis not present

## 2017-03-16 DIAGNOSIS — R42 Dizziness and giddiness: Secondary | ICD-10-CM | POA: Diagnosis not present

## 2017-03-16 DIAGNOSIS — I6302 Cerebral infarction due to thrombosis of basilar artery: Secondary | ICD-10-CM | POA: Diagnosis present

## 2017-03-16 DIAGNOSIS — Z6835 Body mass index (BMI) 35.0-35.9, adult: Secondary | ICD-10-CM | POA: Diagnosis not present

## 2017-03-16 DIAGNOSIS — J45909 Unspecified asthma, uncomplicated: Secondary | ICD-10-CM | POA: Diagnosis present

## 2017-03-16 DIAGNOSIS — R2681 Unsteadiness on feet: Secondary | ICD-10-CM | POA: Diagnosis not present

## 2017-03-16 DIAGNOSIS — R29700 NIHSS score 0: Secondary | ICD-10-CM | POA: Diagnosis present

## 2017-03-16 DIAGNOSIS — I669 Occlusion and stenosis of unspecified cerebral artery: Secondary | ICD-10-CM | POA: Diagnosis not present

## 2017-03-16 LAB — ECHOCARDIOGRAM COMPLETE
HEIGHTINCHES: 61 in
Weight: 3040 oz

## 2017-03-16 LAB — LIPID PANEL
Cholesterol: 272 mg/dL — ABNORMAL HIGH (ref 0–200)
HDL: 30 mg/dL — ABNORMAL LOW (ref >40)
LDL CALC: UNDETERMINED mg/dL (ref 0–99)
Total CHOL/HDL Ratio: 9.1 RATIO
Triglycerides: 783 mg/dL — ABNORMAL HIGH (ref <150)
VLDL: UNDETERMINED mg/dL (ref 0–40)

## 2017-03-16 MED ORDER — IOPAMIDOL (ISOVUE-370) INJECTION 76%
INTRAVENOUS | Status: AC
  Filled 2017-03-16: qty 50

## 2017-03-16 MED ORDER — HYDROCORTISONE-ACETIC ACID 1-2 % OT SOLN: 3.0000 [drp] | Freq: Three times a day (TID) | OTIC | Status: DC | PRN

## 2017-03-16 MED ORDER — ONDANSETRON HCL 4 MG/2ML IJ SOLN
4.0000 mg | Freq: Four times a day (QID) | INTRAMUSCULAR | Status: DC | PRN
  Administered 2017-03-16 – 2017-04-01 (×19): 4 mg via INTRAVENOUS
  Filled 2017-03-16 (×20): qty 2

## 2017-03-16 MED ORDER — ROSUVASTATIN CALCIUM 40 MG PO TABS
40.0000 mg | ORAL_TABLET | Freq: Every day | ORAL | Status: DC
  Administered 2017-03-16 – 2017-03-31 (×16): 40 mg via ORAL
  Filled 2017-03-16: qty 1
  Filled 2017-03-16 (×2): qty 2
  Filled 2017-03-16 (×2): qty 1
  Filled 2017-03-16: qty 2
  Filled 2017-03-16 (×2): qty 1
  Filled 2017-03-16 (×5): qty 2
  Filled 2017-03-16: qty 1
  Filled 2017-03-16 (×2): qty 2

## 2017-03-16 MED ORDER — ENOXAPARIN SODIUM 40 MG/0.4ML ~~LOC~~ SOLN
40.0000 mg | SUBCUTANEOUS | Status: DC
  Administered 2017-03-17 – 2017-03-24 (×8): 40 mg via SUBCUTANEOUS
  Filled 2017-03-16 (×8): qty 0.4

## 2017-03-16 MED ORDER — CLOPIDOGREL BISULFATE 75 MG PO TABS
150.0000 mg | ORAL_TABLET | Freq: Once | ORAL | Status: AC
  Administered 2017-03-16: 150 mg via ORAL
  Filled 2017-03-16: qty 2

## 2017-03-16 MED ORDER — ASPIRIN EC 325 MG PO TBEC
325.0000 mg | DELAYED_RELEASE_TABLET | Freq: Every day | ORAL | Status: DC
  Administered 2017-03-16 – 2017-03-28 (×12): 325 mg via ORAL
  Filled 2017-03-16 (×13): qty 1

## 2017-03-16 MED ORDER — EZETIMIBE 10 MG PO TABS
10.0000 mg | ORAL_TABLET | Freq: Every day | ORAL | Status: DC
  Administered 2017-03-16 – 2017-04-01 (×16): 10 mg via ORAL
  Filled 2017-03-16 (×17): qty 1

## 2017-03-16 MED ORDER — SODIUM CHLORIDE 0.9 % IV SOLN
INTRAVENOUS | Status: DC
  Administered 2017-03-16 – 2017-03-28 (×12): via INTRAVENOUS

## 2017-03-16 NOTE — Progress Notes (Signed)
PROGRESS NOTE    Kathleen Dunn  XTK:[id number] DOB: November 30, 1940 DOA: 03/15/2017 PCP: Hali Marry, MD   Chief Complaint  Patient presents with  . Near Syncope    Brief Narrative:  HPI on 03/15/2017 by Dr. Loma Boston Kathleen Dunn is a 76 y.o. female with a history of asthma, hypertension, thyroid disease, anxiety, on Desogen therapy for hormone replacement. Patient was seen at [facility] for near syncope on June 14 and received a head CT which showed no acute intracranial abnormality but evidence of an old small left cerebellar infarct with small vessel ischemic changes in the white matter. Over the past 2 days the patient has had intermittent shaking and "twisting" of her left hand which last for several minutes. She has been feeling lightheaded over the past few days and took Dramamine twice, which preceded the episodes of shaking and twisting. She denies vertigo, chest pain, weakness, sensory changes. Her CT scan today showed worsening areas of low density in the cerebellum consistent with small acute/subacute cerebellar infarctions. Assessment & Plan   Acute CVA/Cerebellar stroke -Presented with near syncope, complains of visual changes -Patient currently having word/phrase repetition -CT head: Worsening of areas of low density in the cerebellum consistent with small acute/subacute cerebellar infarctions -MRI brain: Scattered acute infarcts in both cerebellar hemispheres and left PCA territory -MRA head: Age-indeterminate occlusion of the nondominant distal right vertebral artery, severe proximal basilar artery stenosis -Pending carotid Doppler, echocardiogram -Hemoglobin A1c pending -LDL unable to calculate -Pending PT, OT, speech therapy consults -Continue aspirin, statin -Neurology consulted and appreciated -Interventional radiology consulted for cerebral angiogram -Plavix added  Essential hypertension -Continue metoprolol, will discontinue  triamterene/HCTZ  Hyperlipidemia -Lipid panel: Total cholesterol 272, HDL 30, LDL unable to calculate, triglycerides 783 -Currently on statin, will likely need to increase dose or switch statins and add fibrate  Hypothyroidism -Continue Synthroid  Anxiety -Continue Klonopin as needed  DVT Prophylaxis  Lovenox  Code Status: Full  Family Communication: None at bedside  Disposition Plan: Currently in observation. Pending further stroke workup  Consultants Neurology  Procedures  None  Antibiotics   Anti-infectives    None      Subjective:   Angelice Piech seen and examined today.  Patient states she's not feeling well, feels sick. 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 Currently denies chest pain, shortness of breath, abdominal pain, constipation, diarrhea. Denies headache.  Objective:   Vitals:   03/15/17 1459 03/15/17 1500 03/15/17 1700 03/15/17 2236  BP:  126/63 129/61 128/62  Pulse:   70 (!) 105  Resp:  16 18 18   Temp: 98.3 F (36.8 C)  97.8 F (36.6 C) 97.7 F (36.5 C)  TempSrc: Oral  Oral Oral  SpO2:   95% 96%  Weight:      Height:       No intake or output data in the 24 hours ending 03/16/17 1130 Filed Weights   03/15/17 0726  Weight: 86.2 kg (190 lb)    Exam  General: Well developed, well nourished, NAD, appears stated age  HEENT: NCAT, PERRLA, EOMI, Anicteic Sclera, mucous membranes moist.   Neck: Supple, no JVD, no masses  Cardiovascular: S1 S2 auscultated, no rubs, murmurs or gallops. Regular rate and rhythm.  Respiratory: Clear to auscultation bilaterally with equal chest rise  Abdomen: Soft, obese, nontender, nondistended, + bowel sounds  Extremities: warm dry without cyanosis clubbing or edema  Neuro: AAOx3, repeats stories, otherwise nonfocal  Skin: Without rashes exudates  or nodules  Psych: appropriate   Data Reviewed: I have personally reviewed following labs and imaging  studies  CBC:  Recent Labs Lab 03/15/17 0753  WBC 8.4  NEUTROABS 4.5  HGB 14.9  HCT 42.6  MCV 92.2  PLT 921   Basic Metabolic Panel:  Recent Labs Lab 03/15/17 0753  NA 137  K 4.1  CL 101  CO2 28  GLUCOSE 116*  BUN 14  CREATININE 0.79  CALCIUM 9.5  MG 1.7   GFR: Estimated Creatinine Clearance: 61.6 mL/min (by C-G formula based on SCr of 0.79 mg/dL). Liver Function Tests:  Recent Labs Lab 03/15/17 0753  AST 21  ALT 18  ALKPHOS 43  BILITOT 0.4  PROT 6.8  ALBUMIN 3.7   No results for input(s): LIPASE, AMYLASE in the last 168 hours. No results for input(s): AMMONIA in the last 168 hours. Coagulation Profile: No results for input(s): INR, PROTIME in the last 168 hours. Cardiac Enzymes: No results for input(s): CKTOTAL, CKMB, CKMBINDEX, TROPONINI in the last 168 hours. BNP (last 3 results) No results for input(s): PROBNP in the last 8760 hours. HbA1C: No results for input(s): HGBA1C in the last 72 hours. CBG: No results for input(s): GLUCAP in the last 168 hours. Lipid Profile:  Recent Labs  03/16/17 0717  CHOL 272*  HDL 30*  LDLCALC UNABLE TO CALCULATE IF TRIGLYCERIDE OVER 400 mg/dL  TRIG 783*  CHOLHDL 9.1   Thyroid Function Tests: No results for input(s): TSH, T4TOTAL, FREET4, T3FREE, THYROIDAB in the last 72 hours. Anemia Panel: No results for input(s): VITAMINB12, FOLATE, FERRITIN, TIBC, IRON, RETICCTPCT in the last 72 hours. Urine analysis:    Component Value Date/Time   COLORURINE YELLOW 03/15/2017 0825   APPEARANCEUR CLEAR 03/15/2017 0825   LABSPEC 1.010 03/15/2017 0825   PHURINE 6.5 03/15/2017 0825   GLUCOSEU NEGATIVE 03/15/2017 0825   HGBUR NEGATIVE 03/15/2017 0825   BILIRUBINUR NEGATIVE 03/15/2017 0825   BILIRUBINUR neg 02/24/2013 1601   KETONESUR NEGATIVE 03/15/2017 0825   PROTEINUR NEGATIVE 03/15/2017 0825   UROBILINOGEN 0.2 02/24/2013 1601   NITRITE NEGATIVE 03/15/2017 0825   LEUKOCYTESUR NEGATIVE 03/15/2017 0825   Sepsis  Labs: @LABRCNTIP (procalcitonin:4,lacticidven:4)  )No results found for this or any previous visit (from the past 240 hour(s)).    Radiology Studies: Dg Lumbar Spine Complete  Result Date: 03/15/2017 CLINICAL DATA:  Pt complains of lower back pain x 5 days with fall, near syncopal episode and dizziness x 2 days EXAM: LUMBAR SPINE - COMPLETE 4+ VIEW COMPARISON:  None. FINDINGS: No fracture.  No spondylolisthesis. Moderate to marked loss of disc height at L1-L2 with endplate sclerosis and osteophytes. Mild loss disc height at T12-L1 and L3-L4. There facet degenerative changes most evident in the lower lumbar spine. Dense aortic vascular calcifications are noted. No evidence of an aneurysm. There are other scattered vascular calcifications. Soft tissues otherwise unremarkable. IMPRESSION: 1. No fracture or acute finding. 2. Degenerative changes as detailed. Electronically Signed   By: Lajean Manes M.D.   On: 03/15/2017 08:26   Ct Head Wo Contrast  Result Date: 03/15/2017 CLINICAL DATA:  Near syncope.  Left arm shaking. EXAM: CT HEAD WITHOUT CONTRAST TECHNIQUE: Contiguous axial images were obtained from the base of the skull through the vertex without intravenous contrast. COMPARISON:  02/27/2017 FINDINGS: Brain: Worsening of areas of low-density in the cerebellar hemispheres, more notable on the right, consistent with subacute cerebellar infarctions. No sign of hemorrhage or swelling. Chronic small-vessel ischemic changes affect the pons. Cerebral hemispheres show  chronic small-vessel ischemic changes of the white matter. No cortical supratentorial infarction identified. No mass lesion, hemorrhage, hydrocephalus or extra-axial collection. Vascular: There is atherosclerotic calcification of the major vessels at the base of the brain. Skull: Negative Sinuses/Orbits: Clear/normal Other: None significant IMPRESSION: Worsening of areas of low-density in the cerebellum consistent with small acute/ subacute  cerebellar infarctions. No sign of hemorrhage or mass effect. Electronically Signed   By: Nelson Chimes M.D.   On: 03/15/2017 08:11   Mr Brain Wo Contrast  Result Date: 03/15/2017 CLINICAL DATA:  76 year old female with with evidence of new cerebellar infarcts from earlier this month on head CT today performed for abnormal left upper extremity movements and near syncope. EXAM: MRI HEAD WITHOUT CONTRAST MRA HEAD WITHOUT CONTRAST TECHNIQUE: Multiplanar, multiecho pulse sequences of the brain and surrounding structures were obtained without intravenous contrast. Angiographic images of the head were obtained using MRA technique without contrast. COMPARISON:  Head CT 0805 hours today.  Head CT 02/27/2017. FINDINGS: MRI HEAD FINDINGS Brain: Patchy restricted diffusion in both cerebellar hemispheres (series 6, image 12). No brainstem involvement. No deep gray matter nuclei involvement, but there are also several scattered small foci of restricted diffusion in the left occipital lobe. Associated T2 and FLAIR hyperintensity. No associated hemorrhage or mass effect. No anterior circulation restricted diffusion. No midline shift, mass effect, evidence of mass lesion, ventriculomegaly, extra-axial collection or acute intracranial hemorrhage. Cervicomedullary junction and pituitary are within normal limits. Patchy bilateral cerebral white matter T2 and FLAIR hyperintensity. No chronic cortical encephalomalacia or chronic cerebral blood products are identified. Signal in the deep gray matter nuclei and brainstem is normal for age. Vascular: Major intracranial vascular flow voids are preserved, the distal left vertebral artery appears dominant and is somewhat dolichoectatic. There is mild generalized intracranial artery tortuosity. Skull and upper cervical spine: Negative. Sinuses/Orbits: Normal orbits soft tissues. Visualized paranasal sinuses and mastoids are stable and well pneumatized. Other: Visible internal auditory  structures appear normal. Negative scalp soft tissues. MRA HEAD FINDINGS Antegrade flow in the dominant distal left vertebral artery. No distal left vertebral artery stenosis. Absent flow in the non dominant appearing right vertebral artery V4 segment, although there is faint flow signal in the distal right vertebral artery at the upper cervical spine. The basilar artery is patent, but there is severe proximal basilar stenosis, corresponding to an area of prominent calcified plaque on the CT head today (series 755, image 10). This is proximal to a dominant appearing right AICA origin. The distal basilar is irregular without additional stenosis. SCA and left PCA origins are normal. There is a fetal type right PCA origin. The left posterior communicating artery is diminutive or absent. There is mild irregularity and attenuated distal flow in the left PCA. The distal right PCA appears normal. Antegrade flow in both ICA siphons. Evidence of a tortuous retropharyngeal course of 1 or board carotid arteries in the neck. No ICA siphon stenosis. Normal ophthalmic artery origins. Patent carotid termini. Normal MCA and ACA origins. Dominant left A1 segment. Anterior communicating artery and visible ACA branches are within normal limits. Visible bilateral MCA branches are within normal limits. IMPRESSION: 1. 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 No associated hemorrhage or mass effect. 2. 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 3. 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 4. Attenuated flow in the distal left PCA. 5. Mild to moderate for  age nonspecific cerebral white matter signal changes. 6. Negative anterior circulation. 7. Salient findings discussed by telephone with Dr. Jeanene Erb on 03/15/2017 at 20:33 . Electronically Signed   By: Genevie Ann M.D.   On: 03/15/2017 20:33   Mr Jodene Nam  Head/brain BH Cm  Result Date: 03/15/2017 CLINICAL DATA:  76 year old female with with evidence of new cerebellar infarcts from earlier this month on head CT today performed for abnormal left upper extremity movements and near syncope. EXAM: MRI HEAD WITHOUT CONTRAST MRA HEAD WITHOUT CONTRAST TECHNIQUE: Multiplanar, multiecho pulse sequences of the brain and surrounding structures were obtained without intravenous contrast. Angiographic images of the head were obtained using MRA technique without contrast. COMPARISON:  Head CT 0805 hours today.  Head CT 02/27/2017. FINDINGS: MRI HEAD FINDINGS Brain: Patchy restricted diffusion in both cerebellar hemispheres (series 6, image 12). No brainstem involvement. No deep gray matter nuclei involvement, but there are also several scattered small foci of restricted diffusion in the left occipital lobe. Associated T2 and FLAIR hyperintensity. No associated hemorrhage or mass effect. No anterior circulation restricted diffusion. No midline shift, mass effect, evidence of mass lesion, ventriculomegaly, extra-axial collection or acute intracranial hemorrhage. Cervicomedullary junction and pituitary are within normal limits. Patchy bilateral cerebral white matter T2 and FLAIR hyperintensity. No chronic cortical encephalomalacia or chronic cerebral blood products are identified. Signal in the deep gray matter nuclei and brainstem is normal for age. Vascular: Major intracranial vascular flow voids are preserved, the distal left vertebral artery appears dominant and is somewhat dolichoectatic. There is mild generalized intracranial artery tortuosity. Skull and upper cervical spine: Negative. Sinuses/Orbits: Normal orbits soft tissues. Visualized paranasal sinuses and mastoids are stable and well pneumatized. Other: Visible internal auditory structures appear normal. Negative scalp soft tissues. MRA HEAD FINDINGS Antegrade flow in the dominant distal left vertebral artery. No distal  left vertebral artery stenosis. Absent flow in the non dominant appearing right vertebral artery V4 segment, although there is faint flow signal in the distal right vertebral artery at the upper cervical spine. The basilar artery is patent, but there is severe proximal basilar stenosis, corresponding to an area of prominent calcified plaque on the CT head today (series 755, image 10). This is proximal to a dominant appearing right AICA origin. The distal basilar is irregular without additional stenosis. SCA and left PCA origins are normal. There is a fetal type right PCA origin. The left posterior communicating artery is diminutive or absent. There is mild irregularity and attenuated distal flow in the left PCA. The distal right PCA appears normal. Antegrade flow in both ICA siphons. Evidence of a tortuous retropharyngeal course of 1 or board carotid arteries in the neck. No ICA siphon stenosis. Normal ophthalmic artery origins. Patent carotid termini. Normal MCA and ACA origins. Dominant left A1 segment. Anterior communicating artery and visible ACA branches are within normal limits. Visible bilateral MCA branches are within normal limits. IMPRESSION: 1. 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 No associated hemorrhage or mass effect. 2. 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 3. 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 4. Attenuated flow in the distal left PCA. 5. Mild to moderate for age nonspecific cerebral white matter signal changes. 6. Negative anterior circulation. 7. Salient findings discussed by telephone with Dr. Jeanene Erb on 03/15/2017 at 20:33 . Electronically Signed   By: Genevie Ann M.D.   On: 03/15/2017  20:33     Scheduled Meds: . iopamidol      .  stroke: mapping our early stages of recovery book   Does not apply Once  . acetic acid-hydrocortisone  3  drop Both EARS TID  . aspirin EC  325 mg Oral Daily  . budesonide (PULMICORT) nebulizer solution  0.25 mg Nebulization BID  . clopidogrel  150 mg Oral Once  . clopidogrel  75 mg Oral Daily  . [START ON 03/17/2017] enoxaparin (LOVENOX) injection  40 mg Subcutaneous Q24H  . famotidine  20 mg Oral BID  . iopamidol      . levothyroxine  50 mcg Oral QAC breakfast  . metoprolol tartrate  25 mg Oral Daily  . simvastatin  40 mg Oral Daily  . triamterene-hydrochlorothiazide  1 capsule Oral Daily   Continuous Infusions: . sodium chloride       LOS: 0 days   Time Spent in minutes   30 minutes  Chelse Matas D.O. on 03/16/2017 at 11:30 AM  Between 7am to 7pm - Pager - [phone]  After 7pm go to [email or web address] - password [email or web address]  And look for the night coverage person covering for me after hours  [facility] Office  [phone]

## 2017-03-16 NOTE — Evaluation (Signed)
Physical Therapy Evaluation Patient Details Name: Kathleen Dunn MRN: [id number] DOB: Jul 15, 1941 Today's Date: 03/16/2017   History of Present Illness  Pt is a 76 yo female admitted through ED on 03/15/17 due to an episode of L arm twisting, dizziness and fatigue. Pt was diagnosed with subacute cerebellar stroke. PMH significant for asthma, HTN, thyroid disease, anxiety.    Clinical Impression  Pt presents with the above diagnosis and below deficits for therapy evaluation. Prior to admission, pt lived alone in a single level apartment at a corporate apartment complex. Pt requires Min guard to supervision for mobility this session with no LOB noted. Pt is considering moving back to [place] with her daughter at discharge depending on her function at discharge. Pt will benefit from continued acute rehab services in order to improve mobility prior to discharge.     Follow Up Recommendations No PT follow up    Equipment Recommendations  None recommended by PT    Recommendations for Other Services       Precautions / Restrictions Precautions Precautions: Fall Restrictions Weight Bearing Restrictions: No      Mobility  Bed Mobility Overal bed mobility: Modified Independent             General bed mobility comments: Pt able to get EOB without any assistance. Just increased time  Transfers Overall transfer level: Needs assistance Equipment used: None Transfers: Sit to/from Stand Sit to Stand: Min guard         General transfer comment: Min gaurd for safety from EOB  Ambulation/Gait Ambulation/Gait assistance: Supervision Ambulation Distance (Feet): 500 Feet Assistive device: None Gait Pattern/deviations: Step-through pattern Gait velocity: decreased Gait velocity interpretation: Below normal speed for age/gender General Gait Details: slow, steady gait. No LOB noted, pt does have increased c/o fatigue and dizziness following activity  Stairs             Wheelchair Mobility    Modified Rankin (Stroke Patients Only)       Balance Overall balance assessment: Needs assistance Sitting-balance support: No upper extremity supported;Feet supported Sitting balance-Leahy Scale: Normal     Standing balance support: No upper extremity supported Standing balance-Leahy Scale: Fair                               Pertinent Vitals/Pain Pain Assessment: No/denies pain    Home Living Family/patient expects to be discharged to:: Private residence Living Arrangements: Alone Available Help at Discharge: Family;Available PRN/intermittently Type of Home: Apartment Home Access: Elevator     Home Layout: One level Home Equipment: Cane - single point      Prior Function Level of Independence: Independent               Hand Dominance   Dominant Hand: Right    Extremity/Trunk Assessment   Upper Extremity Assessment Upper Extremity Assessment: Defer to OT evaluation    Lower Extremity Assessment Lower Extremity Assessment: Generalized weakness    Cervical / Trunk Assessment Cervical / Trunk Assessment: Normal  Communication   Communication: No difficulties  Cognition Arousal/Alertness: Awake/alert Behavior During Therapy: WFL for tasks assessed/performed Overall Cognitive Status: Within Functional Limits for tasks assessed                                        General Comments      Exercises  Assessment/Plan    PT Assessment Patient needs continued PT services  PT Problem List Decreased strength;Decreased activity tolerance;Decreased balance;Decreased mobility       PT Treatment Interventions Gait training;Stair training;Functional mobility training;Therapeutic activities;Therapeutic exercise;Balance training    PT Goals (Current goals can be found in the Care Plan section)  Acute Rehab PT Goals Patient Stated Goal: to feel better and get back home PT Goal Formulation: With  patient Time For Goal Achievement: 03/30/17 Potential to Achieve Goals: Good    Frequency Min 3X/week   Barriers to discharge        Co-evaluation               AM-PAC PT "6 Clicks" Daily Activity  Outcome Measure Difficulty turning over in bed (including adjusting bedclothes, sheets and blankets)?: None Difficulty moving from lying on back to sitting on the side of the bed? : None Difficulty sitting down on and standing up from a chair with arms (e.g., wheelchair, bedside commode, etc,.)?: A Little Help needed moving to and from a bed to chair (including a wheelchair)?: A Little Help needed walking in hospital room?: A Little Help needed climbing 3-5 steps with a railing? : A Little 6 Click Score: 20    End of Session Equipment Utilized During Treatment: Gait belt Activity Tolerance: Patient tolerated treatment well Patient left: with call bell/phone within reach;in bed Nurse Communication: Mobility status PT Visit Diagnosis: Unsteadiness on feet (R26.81)    Time: 1019-1050 PT Time Calculation (min) (ACUTE ONLY): 31 min   Charges:   PT Evaluation $PT Eval Low Complexity: 1 Procedure PT Treatments $Gait Training: 8-22 mins   PT G Codes:   PT G-Codes **NOT FOR INPATIENT CLASS** Functional Assessment Tool Used: AM-PAC 6 Clicks Basic Mobility;Clinical judgement Functional Limitation: Mobility: Walking and moving around Mobility: Walking and Moving Around Current Status (D5520): At least 20 percent but less than 40 percent impaired, limited or restricted Mobility: Walking and Moving Around Goal Status [phone]): 0 percent impaired, limited or restricted    Scheryl Marten PT, DPT  [phone]   Shanon Rosser 03/16/2017, 1:10 PM

## 2017-03-16 NOTE — Consult Note (Signed)
Chief Complaint: CVA  Referring Physician:Dr. Kerney Elbe  Supervising Physician: [name]  Patient Status: [facility] - In-pt  HPI: Kathleen Dunn is a 76 y.o. female with multiple medical problems who presented to the ED with uncontrolled movements of her LUE and weakness along with weakness of her LLE.  She felt dizzy.  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  She was brought to the ED where she was found to have a new right cerebellar infarct compared to a CT she had 2 weeks ago.  She then had an MRA as well.  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  NIR has been asked to see her for evaluation for a diagnostic angiogram.    Past Medical History:  Past Medical History:  Diagnosis Date  . Asthma   . Hypertension   . Lung nodule < 6cm on CT   . Thyroid disease     Past Surgical History:  Past Surgical History:  Procedure Laterality Date  . ABDOMINAL HYSTERECTOMY  age 81   oophorectomy  . BREAST SURGERY     benign bx of left breast    Family History:  Family History  Problem Relation Age of Onset  . Heart attack Father     Social History:  reports that she quit smoking about 45 years ago. She has never used smokeless tobacco. She reports that she does not drink alcohol or use drugs.  Allergies:  Allergies  Allergen Reactions  . Codeine Shortness Of Breath and Other (See Comments)    Chest pain and swelling  . Hydrocodone-Acetaminophen Anaphylaxis  . Morphine Shortness Of Breath and Other (See Comments)    Chest swelling  . Penicillins Rash    Has patient had a PCN reaction causing immediate rash, facial/tongue/throat swelling, SOB or lightheadedness with hypotension: Yes Has patient had a PCN reaction causing severe rash involving mucus membranes or skin necrosis: No Has patient had a PCN reaction that required hospitalization: No Has patient had a PCN reaction occurring  within the last 10 years: No If all of the above answers are "NO", then may proceed with Cephalosporin use.  . Albuterol Other (See Comments)    Shaking (1 puff does not cause the shaking)  . Hydroxyzine Pamoate Nausea And Vomiting    Sedating.   . Lipitor [Atorvastatin] Other (See Comments)    Myalgias  . Morphine And Related   . Simvastatin Other (See Comments)    Stomach ache  . Vistaril [Hydroxyzine Hcl] Other (See Comments)    Sedating.   . Erythromycin Rash  . Sulfa Antibiotics Rash  . Sulfonamide Derivatives Rash    Medications: Medications reviewed in epic  Please HPI for pertinent positives, otherwise complete 10 system ROS negative.  Mallampati Score: MD Evaluation Airway: WNL Heart: WNL Abdomen: WNL Chest/ Lungs: WNL ASA  Classification: 3 Mallampati/Airway Score: One  Physical Exam: BP 128/62 (BP Location: Right Arm)   Pulse (!) 105   Temp 97.7 F (36.5 C) (Oral)   Resp 18   Ht '5\' 1"'$  (1.549 m)   Wt 190 lb (86.2 kg)   SpO2 96%   BMI 35.90 kg/m  Body mass index is 35.9 kg/m. General: elderly obese white female who is laying in bed in NAD HEENT: head is normocephalic, atraumatic.  Sclera are noninjected.  PERRL.  Ears and nose without any masses or lesions.  Mouth is pink  and moist Heart: regular, rate, and rhythm.  Normal s1,s2. No obvious murmurs, gallops, or rubs noted.  Palpable radial and pedal pulses bilaterally Lungs: CTAB, no wheezes, rhonchi, or rales noted.  Respiratory effort nonlabored Abd: soft, NT, ND, +BS, no masses, hernias, or organomegaly Psych: A&Ox3 with an appropriate affect, but at times likes to talk about random things that do not pertain to the conversation at hand.   Labs: Results for orders placed or performed during the hospital encounter of 03/15/17 (from the past 48 hour(s))  CBC WITH DIFFERENTIAL     Status: None   Collection Time: 03/15/17  7:53 AM  Result Value Ref Range   WBC 8.4 4.0 - 10.5 K/uL   RBC 4.62 3.87 -  5.11 MIL/uL   Hemoglobin 14.9 12.0 - 15.0 g/dL   HCT 42.6 36.0 - 46.0 %   MCV 92.2 78.0 - 100.0 fL   MCH 32.3 26.0 - 34.0 pg   MCHC 35.0 30.0 - 36.0 g/dL   RDW 13.2 11.5 - 15.5 %   Platelets 261 150 - 400 K/uL   Neutrophils Relative % 53 %   Neutro Abs 4.5 1.7 - 7.7 K/uL   Lymphocytes Relative 31 %   Lymphs Abs 2.6 0.7 - 4.0 K/uL   Monocytes Relative 10 %   Monocytes Absolute 0.9 0.1 - 1.0 K/uL   Eosinophils Relative 5 %   Eosinophils Absolute 0.4 0.0 - 0.7 K/uL   Basophils Relative 1 %   Basophils Absolute 0.0 0.0 - 0.1 K/uL  Comprehensive metabolic panel     Status: Abnormal   Collection Time: 03/15/17  7:53 AM  Result Value Ref Range   Sodium 137 135 - 145 mmol/L   Potassium 4.1 3.5 - 5.1 mmol/L   Chloride 101 101 - 111 mmol/L   CO2 28 22 - 32 mmol/L   Glucose, Bld 116 (H) 65 - 99 mg/dL   BUN 14 6 - 20 mg/dL   Creatinine, Ser 0.79 0.44 - 1.00 mg/dL   Calcium 9.5 8.9 - 10.3 mg/dL   Total Protein 6.8 6.5 - 8.1 g/dL   Albumin 3.7 3.5 - 5.0 g/dL   AST 21 15 - 41 U/L   ALT 18 14 - 54 U/L   Alkaline Phosphatase 43 38 - 126 U/L   Total Bilirubin 0.4 0.3 - 1.2 mg/dL   GFR calc non Af Amer >60 >60 mL/min   GFR calc Af Amer >60 >60 mL/min    Comment: (NOTE) The eGFR has been calculated using the CKD EPI equation. This calculation has not been validated in all clinical situations. eGFR's persistently <60 mL/min signify possible Chronic Kidney Disease.    Anion gap 8 5 - 15  Magnesium     Status: None   Collection Time: 03/15/17  7:53 AM  Result Value Ref Range   Magnesium 1.7 1.7 - 2.4 mg/dL  Urine rapid drug screen (hosp performed)     Status: None   Collection Time: 03/15/17  8:25 AM  Result Value Ref Range   Opiates NONE DETECTED NONE DETECTED   Cocaine NONE DETECTED NONE DETECTED   Benzodiazepines NONE DETECTED NONE DETECTED   Amphetamines NONE DETECTED NONE DETECTED   Tetrahydrocannabinol NONE DETECTED NONE DETECTED   Barbiturates NONE DETECTED NONE DETECTED     Comment:        DRUG SCREEN FOR MEDICAL PURPOSES ONLY.  IF CONFIRMATION IS NEEDED FOR ANY PURPOSE, NOTIFY LAB WITHIN 5 DAYS.  LOWEST DETECTABLE LIMITS FOR URINE DRUG SCREEN Drug Class       Cutoff (ng/mL) Amphetamine      1000 Barbiturate      200 Benzodiazepine   062 Tricyclics       376 Opiates          300 Cocaine          300 THC              50   Urinalysis, Routine w reflex microscopic     Status: None   Collection Time: 03/15/17  8:25 AM  Result Value Ref Range   Color, Urine YELLOW YELLOW   APPearance CLEAR CLEAR   Specific Gravity, Urine 1.010 1.005 - 1.030   pH 6.5 5.0 - 8.0   Glucose, UA NEGATIVE NEGATIVE mg/dL   Hgb urine dipstick NEGATIVE NEGATIVE   Bilirubin Urine NEGATIVE NEGATIVE   Ketones, ur NEGATIVE NEGATIVE mg/dL   Protein, ur NEGATIVE NEGATIVE mg/dL   Nitrite NEGATIVE NEGATIVE   Leukocytes, UA NEGATIVE NEGATIVE    Comment: Microscopic not done on urines with negative protein, blood, leukocytes, nitrite, or glucose < 500 mg/dL.  Lipid panel     Status: Abnormal   Collection Time: 03/16/17  7:17 AM  Result Value Ref Range   Cholesterol 272 (H) 0 - 200 mg/dL   Triglycerides 783 (H) <150 mg/dL   HDL 30 (L) >40 mg/dL   Total CHOL/HDL Ratio 9.1 RATIO   VLDL UNABLE TO CALCULATE IF TRIGLYCERIDE OVER 400 mg/dL 0 - 40 mg/dL   LDL Cholesterol UNABLE TO CALCULATE IF TRIGLYCERIDE OVER 400 mg/dL 0 - 99 mg/dL    Comment:        Total Cholesterol/HDL:CHD Risk Coronary Heart Disease Risk Table                     Men   Women  1/2 Average Risk   3.4   3.3  Average Risk       5.0   4.4  2 X Average Risk   9.6   7.1  3 X Average Risk  23.4   11.0        Use the calculated Patient Ratio above and the CHD Risk Table to determine the patient's CHD Risk.        ATP III CLASSIFICATION (LDL):  <100     mg/dL   Optimal  100-129  mg/dL   Near or Above                    Optimal  130-159  mg/dL   Borderline  160-189  mg/dL   High  >190     mg/dL   Very High      Imaging: Dg Lumbar Spine Complete  Result Date: 03/15/2017 CLINICAL DATA:  Pt complains of lower back pain x 5 days with fall, near syncopal episode and dizziness x 2 days EXAM: LUMBAR SPINE - COMPLETE 4+ VIEW COMPARISON:  None. FINDINGS: No fracture.  No spondylolisthesis. Moderate to marked loss of disc height at L1-L2 with endplate sclerosis and osteophytes. Mild loss disc height at T12-L1 and L3-L4. There facet degenerative changes most evident in the lower lumbar spine. Dense aortic vascular calcifications are noted. No evidence of an aneurysm. There are other scattered vascular calcifications. Soft tissues otherwise unremarkable. IMPRESSION: 1. No fracture or acute finding. 2. Degenerative changes as detailed. Electronically Signed   By: Lajean Manes M.D.   On: 03/15/2017 08:26  Ct Head Wo Contrast  Result Date: 03/15/2017 CLINICAL DATA:  Near syncope.  Left arm shaking. EXAM: CT HEAD WITHOUT CONTRAST TECHNIQUE: Contiguous axial images were obtained from the base of the skull through the vertex without intravenous contrast. COMPARISON:  02/27/2017 FINDINGS: Brain: Worsening of areas of low-density in the cerebellar hemispheres, more notable on the right, consistent with subacute cerebellar infarctions. No sign of hemorrhage or swelling. Chronic small-vessel ischemic changes affect the pons. Cerebral hemispheres show chronic small-vessel ischemic changes of the white matter. No cortical supratentorial infarction identified. No mass lesion, hemorrhage, hydrocephalus or extra-axial collection. Vascular: There is atherosclerotic calcification of the major vessels at the base of the brain. Skull: Negative Sinuses/Orbits: Clear/normal Other: None significant IMPRESSION: Worsening of areas of low-density in the cerebellum consistent with small acute/ subacute cerebellar infarctions. No sign of hemorrhage or mass effect. Electronically Signed   By: Nelson Chimes M.D.   On: 03/15/2017 08:11   Mr Brain  Wo Contrast  Result Date: 03/15/2017 CLINICAL DATA:  76 year old female with with evidence of new cerebellar infarcts from earlier this month on head CT today performed for abnormal left upper extremity movements and near syncope. EXAM: MRI HEAD WITHOUT CONTRAST MRA HEAD WITHOUT CONTRAST TECHNIQUE: Multiplanar, multiecho pulse sequences of the brain and surrounding structures were obtained without intravenous contrast. Angiographic images of the head were obtained using MRA technique without contrast. COMPARISON:  Head CT 0805 hours today.  Head CT 02/27/2017. FINDINGS: MRI HEAD FINDINGS Brain: Patchy restricted diffusion in both cerebellar hemispheres (series 6, image 12). No brainstem involvement. No deep gray matter nuclei involvement, but there are also several scattered small foci of restricted diffusion in the left occipital lobe. Associated T2 and FLAIR hyperintensity. No associated hemorrhage or mass effect. No anterior circulation restricted diffusion. No midline shift, mass effect, evidence of mass lesion, ventriculomegaly, extra-axial collection or acute intracranial hemorrhage. Cervicomedullary junction and pituitary are within normal limits. Patchy bilateral cerebral white matter T2 and FLAIR hyperintensity. No chronic cortical encephalomalacia or chronic cerebral blood products are identified. Signal in the deep gray matter nuclei and brainstem is normal for age. Vascular: Major intracranial vascular flow voids are preserved, the distal left vertebral artery appears dominant and is somewhat dolichoectatic. There is mild generalized intracranial artery tortuosity. Skull and upper cervical spine: Negative. Sinuses/Orbits: Normal orbits soft tissues. Visualized paranasal sinuses and mastoids are stable and well pneumatized. Other: Visible internal auditory structures appear normal. Negative scalp soft tissues. MRA HEAD FINDINGS Antegrade flow in the dominant distal left vertebral artery. No distal left  vertebral artery stenosis. Absent flow in the non dominant appearing right vertebral artery V4 segment, although there is faint flow signal in the distal right vertebral artery at the upper cervical spine. The basilar artery is patent, but there is severe proximal basilar stenosis, corresponding to an area of prominent calcified plaque on the CT head today (series 755, image 10). This is proximal to a dominant appearing right AICA origin. The distal basilar is irregular without additional stenosis. SCA and left PCA origins are normal. There is a fetal type right PCA origin. The left posterior communicating artery is diminutive or absent. There is mild irregularity and attenuated distal flow in the left PCA. The distal right PCA appears normal. Antegrade flow in both ICA siphons. Evidence of a tortuous retropharyngeal course of 1 or board carotid arteries in the neck. No ICA siphon stenosis. Normal ophthalmic artery origins. Patent carotid termini. Normal MCA and ACA origins. Dominant left A1 segment. Anterior  communicating artery and visible ACA branches are within normal limits. Visible bilateral MCA branches are within normal limits. IMPRESSION: 1. 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 No associated hemorrhage or mass effect. 2. 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 3. 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 4. Attenuated flow in the distal left PCA. 5. Mild to moderate for age nonspecific cerebral white matter signal changes. 6. Negative anterior circulation. 7. Salient findings discussed by telephone with Dr. Jeanene Erb on 03/15/2017 at 20:33 . Electronically Signed   By: Genevie Ann M.D.   On: 03/15/2017 20:33   Mr Jodene Nam Head/brain EH Cm  Result Date: 03/15/2017 CLINICAL DATA:  76 year old female with with evidence of new cerebellar infarcts from earlier this month  on head CT today performed for abnormal left upper extremity movements and near syncope. EXAM: MRI HEAD WITHOUT CONTRAST MRA HEAD WITHOUT CONTRAST TECHNIQUE: Multiplanar, multiecho pulse sequences of the brain and surrounding structures were obtained without intravenous contrast. Angiographic images of the head were obtained using MRA technique without contrast. COMPARISON:  Head CT 0805 hours today.  Head CT 02/27/2017. FINDINGS: MRI HEAD FINDINGS Brain: Patchy restricted diffusion in both cerebellar hemispheres (series 6, image 12). No brainstem involvement. No deep gray matter nuclei involvement, but there are also several scattered small foci of restricted diffusion in the left occipital lobe. Associated T2 and FLAIR hyperintensity. No associated hemorrhage or mass effect. No anterior circulation restricted diffusion. No midline shift, mass effect, evidence of mass lesion, ventriculomegaly, extra-axial collection or acute intracranial hemorrhage. Cervicomedullary junction and pituitary are within normal limits. Patchy bilateral cerebral white matter T2 and FLAIR hyperintensity. No chronic cortical encephalomalacia or chronic cerebral blood products are identified. Signal in the deep gray matter nuclei and brainstem is normal for age. Vascular: Major intracranial vascular flow voids are preserved, the distal left vertebral artery appears dominant and is somewhat dolichoectatic. There is mild generalized intracranial artery tortuosity. Skull and upper cervical spine: Negative. Sinuses/Orbits: Normal orbits soft tissues. Visualized paranasal sinuses and mastoids are stable and well pneumatized. Other: Visible internal auditory structures appear normal. Negative scalp soft tissues. MRA HEAD FINDINGS Antegrade flow in the dominant distal left vertebral artery. No distal left vertebral artery stenosis. Absent flow in the non dominant appearing right vertebral artery V4 segment, although there is faint flow signal in  the distal right vertebral artery at the upper cervical spine. The basilar artery is patent, but there is severe proximal basilar stenosis, corresponding to an area of prominent calcified plaque on the CT head today (series 755, image 10). This is proximal to a dominant appearing right AICA origin. The distal basilar is irregular without additional stenosis. SCA and left PCA origins are normal. There is a fetal type right PCA origin. The left posterior communicating artery is diminutive or absent. There is mild irregularity and attenuated distal flow in the left PCA. The distal right PCA appears normal. Antegrade flow in both ICA siphons. Evidence of a tortuous retropharyngeal course of 1 or board carotid arteries in the neck. No ICA siphon stenosis. Normal ophthalmic artery origins. Patent carotid termini. Normal MCA and ACA origins. Dominant left A1 segment. Anterior communicating artery and visible ACA branches are within normal limits. Visible bilateral MCA branches are within normal limits. IMPRESSION: 1. 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 No associated hemorrhage or mass effect. 2.  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 3. 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 4. Attenuated flow in the distal left PCA. 5. Mild to moderate for age nonspecific cerebral white matter signal changes. 6. Negative anterior circulation. 7. Salient findings discussed by telephone with Dr. Jeanene Erb on 03/15/2017 at 20:33 . Electronically Signed   By: Genevie Ann M.D.   On: 03/15/2017 20:33    Assessment/Plan 1. CVA, basilar artery stenosis  I have had a thorough discussion with the patient regarding a diagnostic cerebral angiogram to further evaluate this area of stenosis.  At times, the patient felt overwhelmed and broke down and cried.  She stated she did not want to consent to  this yet until she could talk to her daughters.  One of her daughters is in [place] and one in [place].  She was hoping that both daughters could be here, which I think, at least for the [place] daughter, is unreasonable to think she is going to be here by tomorrow to help her consent for this procedure.  I told her to call her children today and discuss this with them on the phone and try to determine what she would like to do.  I informed her that our scheduling would allow for an angio either Monday or Tuesday.  I have spoken with Dr. Estanislado Pandy and he would like to proceed on Monday if the patient becomes agreeable by then.  We will check on her in the am to see what she has decided after speaking to her family today.  Thank you for this interesting consult.  I greatly enjoyed meeting Kathleen Dunn and look forward to participating in their care.  A copy of this report was sent to the requesting provider on this date.  Electronically Signed: Henreitta Cea 03/16/2017, 10:29 AM   I spent a total of 40 Minutes    in face to face in clinical consultation, greater than 50% of which was counseling/coordinating care for basilar artery stenosis

## 2017-03-16 NOTE — Progress Notes (Signed)
  Echocardiogram 2D Echocardiogram has been performed.  Jennette Dubin 03/16/2017, 4:16 PM

## 2017-03-16 NOTE — Progress Notes (Signed)
STROKE TEAM PROGRESS NOTE   HISTORY OF PRESENT ILLNESS (per record) Kathleen Dunn is a 76 y.o. female transferred from an outside ER.  She presented with 2 episodes of left arm twisting and uncontrollable movements. She is a poor historian and cannot demonstrate what happened or corroborate if movements were rhythmic or not.  It lasted a few minutes.  Each time it happened after taking Dramamine for dizziness and imbalance.  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  She has a history of HTN.  No DM, smoking.  She is on a statin.     SUBJECTIVE (INTERVAL HISTORY) No family is at the bedside.  Pt stated that she had slight HA this am and some nausea but otherwise not complains. Denies any dizziness, weakness.    OBJECTIVE Temp:  [97.7 F (36.5 C)-98.3 F (36.8 C)] 97.7 F (36.5 C) (06/30 2236) Pulse Rate:  [61-105] 105 (06/30 2236) Cardiac Rhythm: Sinus bradycardia (07/01 0701) Resp:  [15-25] 18 (06/30 2236) BP: (103-129)/(48-63) 128/62 (06/30 2236) SpO2:  [95 %-96 %] 96 % (06/30 2236)  CBC:   Recent Labs Lab 03/15/17 0753  WBC 8.4  NEUTROABS 4.5  HGB 14.9  HCT 42.6  MCV 92.2  PLT 175    Basic Metabolic Panel:   Recent Labs Lab 03/15/17 0753  NA 137  K 4.1  CL 101  CO2 28  GLUCOSE 116*  BUN 14  CREATININE 0.79  CALCIUM 9.5  MG 1.7    Lipid Panel:     Component Value Date/Time   CHOL 272 (H) 03/16/2017 0717   TRIG 783 (H) 03/16/2017 0717   TRIG 239 03/07/2009   HDL 30 (L) 03/16/2017 0717   CHOLHDL 9.1 03/16/2017 0717   VLDL UNABLE TO CALCULATE IF TRIGLYCERIDE OVER 400 mg/dL 03/16/2017 0717   LDLCALC UNABLE TO CALCULATE IF TRIGLYCERIDE OVER 400 mg/dL 03/16/2017 0717   HgbA1c:  Lab Results  Component Value Date   HGBA1C 6.0 11/03/2015   Urine Drug Screen:     Component Value Date/Time   LABOPIA NONE DETECTED 03/15/2017 0825   COCAINSCRNUR NONE  DETECTED 03/15/2017 0825   LABBENZ NONE DETECTED 03/15/2017 0825   AMPHETMU NONE DETECTED 03/15/2017 0825   THCU NONE DETECTED 03/15/2017 0825   LABBARB NONE DETECTED 03/15/2017 0825    Alcohol Level No results found for: [place] I have personally reviewed the radiological images below and agree with the radiology interpretations.  Ct Head Wo Contrast 03/15/2017 Worsening of areas of low-density in the cerebellum consistent with small acute/ subacute cerebellar infarctions. No sign of hemorrhage or mass effect.    Mr Jodene Nam Head/brain Wo Cm 03/15/2017 1. 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 No associated hemorrhage or mass effect.  2. 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  3. 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  4. Attenuated flow in the distal left PCA.  5. Mild to moderate for age nonspecific cerebral white matter signal changes.  6. Negative anterior circulation.   CTA head and neck pending   DSA pending  TTE pending  EEG pending   PHYSICAL EXAM  Temp:  [97.7 F (36.5 C)-98.3 F (36.8 C)] 97.7 F (36.5 C) (06/30 2236) Pulse Rate:  [70-105] 105 (06/30 2236) Resp:  [15-25] 18 (06/30 2236) BP: (  108-129)/(59-63) 128/62 (06/30 2236) SpO2:  [95 %-96 %] 96 % (06/30 2236)  General - Well nourished, well developed, in no apparent distress.  Ophthalmologic - Fundi not visualized due to noncooperation.  Cardiovascular - Regular rate and rhythm.  Mental Status -  Level of arousal and orientation to time, place, and person were intact. Language including expression, naming, repetition, comprehension was assessed and found intact. Fund of Knowledge was assessed and was intact.  Cranial Nerves II - XII - II - Visual field intact OU. III, IV, VI - Extraocular movements intact. V - Facial sensation intact  bilaterally. VII - Facial movement intact bilaterally. VIII - Hearing & vestibular intact bilaterally. X - Palate elevates symmetrically. XI - Chin turning & shoulder shrug intact bilaterally. XII - Tongue protrusion intact.  Motor Strength - The patient's strength was normal in all extremities and pronator drift was absent.  Bulk was normal and fasciculations were absent.   Motor Tone - Muscle tone was assessed at the neck and appendages and was normal.  Reflexes - The patient's reflexes were 1+ in all extremities and she had no pathological reflexes.  Sensory - Light touch, temperature/pinprick were assessed and were symmetrical.    Coordination - The patient had normal movements in the hands and feet with no ataxia or dysmetria.  Tremor was absent.  Gait and Station - deferred   ASSESSMENT/PLAN [name] is a 76 y.o. female with history of hypertension, asthma, known lung nodule, and thyroid disease presenting with uncontrollable movements of the left upper extremity. She did not receive IV t-PA due to transient deficits.  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  Resultant  No deficit  CT head - small acute/ subacute cerebellar infarctions.  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  CTA H&N - pending  Cerebral angio - pending  2D Echo - pending  LDL - unable to calculate - triglycerides 783  HgbA1c - pending  VTE prophylaxis - Lovenox Diet Heart Room service appropriate? Yes; Fluid consistency: Thin Diet NPO time specified Except for: Sips with Meds  No antithrombotic prior to admission, now on aspirin 325 mg daily and clopidogrel 75 mg daily. Will give plavix load.   Patient counseled to be compliant with her  antithrombotic medications  Ongoing aggressive stroke risk factor management  Therapy recommendations: pending  Disposition:  Pending  Posterior circulation athero  MRA showed BA severe stenosis, right VA occlusion and left PCA decreased flow  CTA head and neck pending  DSA pending  On DAPT with plavix load  Hypertension  Stable  Permissive hypertension (OK if < 220/120) but gradually normalize in 5-7 days  BP goal 130-160 due to intracranial stenosis  Hyperlipidemia  Home meds:  No lipid lowering medications prior to admission  LDL could not be calculated secondary to elevated triglycerides, goal < 70  Now on crestor 40 and zetia 10  Continue at discharge  Other Stroke Risk Factors  Advanced age  Former cigarette smoker - quit 45 years ago.  Obesity, Body mass index is 35.9 kg/m., recommend weight loss, diet and exercise as appropriate   Depo Estradiol PTA  Other Active Problems   Hospital day # 0  Rosalin Hawking, MD PhD Stroke Neurology 03/16/2017 2:19 PM  To contact Stroke Continuity provider, please refer to [email or web address] After hours, contact General Neurology

## 2017-03-17 ENCOUNTER — Other Ambulatory Visit (HOSPITAL_COMMUNITY): Payer: Self-pay | Admitting: *Deleted

## 2017-03-17 ENCOUNTER — Inpatient Hospital Stay (HOSPITAL_COMMUNITY): Payer: Medicare Other

## 2017-03-17 DIAGNOSIS — I639 Cerebral infarction, unspecified: Secondary | ICD-10-CM

## 2017-03-17 LAB — CBC
HEMATOCRIT: 40.9 % (ref 36.0–46.0)
Hemoglobin: 13.5 g/dL (ref 12.0–15.0)
MCH: 30.8 pg (ref 26.0–34.0)
MCHC: 33 g/dL (ref 30.0–36.0)
MCV: 93.2 fL (ref 78.0–100.0)
PLATELETS: 232 10*3/uL (ref 150–400)
RBC: 4.39 MIL/uL (ref 3.87–5.11)
RDW: 13.3 % (ref 11.5–15.5)
WBC: 7.3 10*3/uL (ref 4.0–10.5)

## 2017-03-17 LAB — BASIC METABOLIC PANEL
ANION GAP: 8 (ref 5–15)
BUN: 16 mg/dL (ref 6–20)
CALCIUM: 8.6 mg/dL — AB (ref 8.9–10.3)
CO2: 24 mmol/L (ref 22–32)
Chloride: 104 mmol/L (ref 101–111)
Creatinine, Ser: 0.92 mg/dL (ref 0.44–1.00)
GFR, EST NON AFRICAN AMERICAN: 60 mL/min — AB (ref >60)
Glucose, Bld: 98 mg/dL (ref 65–99)
POTASSIUM: 4.2 mmol/L (ref 3.5–5.1)
Sodium: 136 mmol/L (ref 135–145)

## 2017-03-17 LAB — HEMOGLOBIN A1C
Hgb A1c MFr Bld: 5.8 % — ABNORMAL HIGH (ref 4.8–5.6)
MEAN PLASMA GLUCOSE: 120 mg/dL

## 2017-03-17 LAB — PROTIME-INR
INR: 1.09
Prothrombin Time: 14.1 seconds (ref 11.4–15.2)

## 2017-03-17 MED ORDER — CLONAZEPAM 0.125 MG PO TBDP
0.2500 mg | ORAL_TABLET | Freq: Once | ORAL | Status: AC
  Administered 2017-03-18: 0.25 mg via ORAL
  Filled 2017-03-17: qty 2

## 2017-03-17 MED ORDER — LOSARTAN POTASSIUM 50 MG PO TABS
25.0000 mg | ORAL_TABLET | Freq: Every day | ORAL | Status: DC
  Administered 2017-03-17 – 2017-03-18 (×2): 25 mg via ORAL
  Filled 2017-03-17 (×2): qty 1

## 2017-03-17 MED ORDER — IOPAMIDOL (ISOVUE-370) INJECTION 76%
INTRAVENOUS | Status: AC
  Administered 2017-03-17: 50 mL
  Filled 2017-03-17: qty 50

## 2017-03-17 NOTE — Progress Notes (Signed)
OT Cancellation Note  Patient Details Name: Kathleen Dunn MRN: [id number] DOB: 08-12-1941   Cancelled Treatment:    Reason Eval/Treat Not Completed: Fatigue/lethargy limiting ability to participate;Patient declined, no reason specified. Pt politely declined participation in OT evaluation this afternoon despite encouragement and education concerning benefits of participation in ADL to maximize recovery. She reports that "I have been very busy today and need to rest." OT will check back as able to complete evaluation.   Norman Herrlich, MS OTR/L  Pager: [phone]   Norman Herrlich 03/17/2017, 4:48 PM

## 2017-03-17 NOTE — Progress Notes (Signed)
STROKE TEAM PROGRESS NOTE   SUBJECTIVE (INTERVAL HISTORY) No family is at the bedside.  No acute event overnight. Pt has b/l essential tremor and she said if she is not on her BP meds, she will have the tremor.    OBJECTIVE Temp:  [97.4 F (36.3 C)-98 F (36.7 C)] 97.4 F (36.3 C) (07/02 0621) Pulse Rate:  [56-72] 58 (07/02 0621) Cardiac Rhythm: Sinus bradycardia (07/02 0700) Resp:  [16-20] 20 (07/02 0621) BP: (125-146)/(45-57) 128/45 (07/02 0621) SpO2:  [94 %-97 %] 94 % (07/02 0621)  CBC:   Recent Labs Lab 03/15/17 0753 03/17/17 0630  WBC 8.4 7.3  NEUTROABS 4.5  --   HGB 14.9 13.5  HCT 42.6 40.9  MCV 92.2 93.2  PLT 261 253    Basic Metabolic Panel:   Recent Labs Lab 03/15/17 0753 03/17/17 0630  NA 137 136  K 4.1 4.2  CL 101 104  CO2 28 24  GLUCOSE 116* 98  BUN 14 16  CREATININE 0.79 0.92  CALCIUM 9.5 8.6*  MG 1.7  --     Lipid Panel:     Component Value Date/Time   CHOL 272 (H) 03/16/2017 0717   TRIG 783 (H) 03/16/2017 0717   TRIG 239 03/07/2009   HDL 30 (L) 03/16/2017 0717   CHOLHDL 9.1 03/16/2017 0717   VLDL UNABLE TO CALCULATE IF TRIGLYCERIDE OVER 400 mg/dL 03/16/2017 0717   LDLCALC UNABLE TO CALCULATE IF TRIGLYCERIDE OVER 400 mg/dL 03/16/2017 0717   HgbA1c:  Lab Results  Component Value Date   HGBA1C 5.8 (H) 03/16/2017   Urine Drug Screen:     Component Value Date/Time   LABOPIA NONE DETECTED 03/15/2017 0825   COCAINSCRNUR NONE DETECTED 03/15/2017 0825   LABBENZ NONE DETECTED 03/15/2017 0825   AMPHETMU NONE DETECTED 03/15/2017 0825   THCU NONE DETECTED 03/15/2017 0825   LABBARB NONE DETECTED 03/15/2017 0825    Alcohol Level No results found for: [place] I have personally reviewed the radiological images below and agree with the radiology interpretations.  Ct Head Wo Contrast 03/15/2017 Worsening of areas of low-density in the cerebellum consistent with small acute/ subacute cerebellar infarctions. No sign of hemorrhage or mass  effect.    Mr Jodene Nam Head/brain Wo Cm 03/15/2017 1. 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 No associated hemorrhage or mass effect.  2. 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  3. 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  4. Attenuated flow in the distal left PCA.  5. Mild to moderate for age nonspecific cerebral white matter signal changes.  6. Negative anterior circulation.   CTA head and neck pending   DSA pending  TTE - Left ventricle: The cavity size was normal. Wall thickness was   increased in a pattern of moderate LVH. Systolic function was   normal. The estimated ejection fraction was in the range of 60%   to 65%. Wall motion was normal; there were no regional wall   motion abnormalities. Doppler parameters are consistent with   abnormal left ventricular relaxation (grade 1 diastolic   dysfunction). - Aortic valve: There was trivial regurgitation. - Pericardium, extracardiac: A trivial pericardial effusion was   identified.  EEG pending   PHYSICAL EXAM  Temp:  [97.4 F (36.3 C)-98 F (36.7 C)] 97.4 F (36.3 C) (07/02 0621) Pulse Rate:  [56-72] 58 (07/02 0621) Resp:  [16-20] 20 (07/02  7253) BP: (125-146)/(45-57) 128/45 (07/02 0621) SpO2:  [94 %-97 %] 94 % (07/02 0621)  General - Well nourished, well developed, in no apparent distress.  Ophthalmologic - Fundi not visualized due to noncooperation.  Cardiovascular - Regular rate and rhythm.  Mental Status -  Level of arousal and orientation to time, place, and person were intact. Language including expression, naming, repetition, comprehension was assessed and found intact. Fund of Knowledge was assessed and was intact.  Cranial Nerves II - XII - II - Visual field intact OU. III, IV, VI - Extraocular movements intact. V - Facial sensation intact bilaterally. VII -  Facial movement intact bilaterally. VIII - Hearing & vestibular intact bilaterally. X - Palate elevates symmetrically. XI - Chin turning & shoulder shrug intact bilaterally. XII - Tongue protrusion intact.  Motor Strength - The patient's strength was normal in all extremities and pronator drift was absent.  Bulk was normal and fasciculations were absent.   Motor Tone - Muscle tone was assessed at the neck and appendages and was normal.  Reflexes - The patient's reflexes were 1+ in all extremities and she had no pathological reflexes.  Sensory - Light touch, temperature/pinprick were assessed and were symmetrical.    Coordination - The patient had normal movements in the hands and feet with no ataxia or dysmetria.  Tremor was absent.  Gait and Station - deferred   ASSESSMENT/PLAN [name] is a 76 y.o. female with history of hypertension, asthma, known lung nodule, and thyroid disease presenting with uncontrollable movements of the left upper extremity. She did not receive IV t-PA due to transient deficits.  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  Resultant  No deficit  CT head - small acute/ subacute cerebellar infarctions.  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  CTA H&N - pending  Cerebral angio - pending  EEG - pending  2D Echo - unremarkable  LDL - unable to calculate - triglycerides 783  HgbA1c 5.8  VTE prophylaxis - Lovenox Diet NPO time specified Except for: Sips with Meds  No antithrombotic prior to admission, now on aspirin 325 mg daily and clopidogrel 75 mg daily.  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  Obesity, Body mass index is 35.9 kg/m., recommend weight loss, diet and exercise as appropriate   Depo Estradiol PTA  Other Active Problems  Essential tremor - on metoprolol  Hospital day # 1  Rosalin Hawking, MD PhD Stroke Neurology 03/17/2017 12:29 PM   To contact  Stroke Continuity provider, please refer to [email or web address] After hours, contact General Neurology

## 2017-03-17 NOTE — Progress Notes (Signed)
Pt anxious about test scheduled for tomorrow, klonopin already given at 2144. According to pt it did not help. Pt requested ativan to help her sleep. Baltazar Najjar, NP notified. Awaiting response.

## 2017-03-17 NOTE — Procedures (Signed)
ELECTROENCEPHALOGRAM REPORT  Date of Study: 03/17/2017  Patient's Name: Kathleen Dunn MRN: [id number] Date of Birth: 05/03/1941  Referring Provider: Rogue Jury, MD  Clinical History: 76 year old woman with cerebellar infarcts and episodes of shaking.  Medications: acetaminophen (TYLENOL) tablet 650 mg  aspirin EC tablet 325 mg  clonazePAM (KLONOPIN) tablet 1 mg  clopidogrel (PLAVIX) tablet 75 mg  enoxaparin (LOVENOX) injection 40 mg  ezetimibe (ZETIA) tablet 10 mg  famotidine (PEPCID) tablet 20 mg  levothyroxine (SYNTHROID, LEVOTHROID) tablet 50 mcg  metoprolol tartrate (LOPRESSOR) tablet 25 mg  ondansetron (ZOFRAN) injection 4 mg  rosuvastatin (CRESTOR) tablet 40 mg   Technical Summary: A multichannel digital EEG recording measured by the international 10-20 system with electrodes applied with paste and impedances below 5000 ohms performed in our laboratory with EKG monitoring in an awake and drowsy patient.  Hyperventilation and photic stimulation were not performed.  The digital EEG was referentially recorded, reformatted, and digitally filtered in a variety of bipolar and referential montages for optimal display.    Description: The patient is awake and drowsy during the recording.  During maximal wakefulness, there is a symmetric, medium voltage 10 Hz posterior dominant rhythm that attenuates with eye opening.  The record is symmetric.  During drowsiness, there is an increase in theta slowing of the background.  Stage 2 sleep is not seen.  There were no epileptiform discharges or electrographic seizures seen.    EKG lead was unremarkable.  Impression: This awake and drowsy EEG is normal.    Clinical Correlation: A normal EEG does not exclude a clinical diagnosis of epilepsy.  If further clinical questions remain, prolonged EEG may be helpful.  Clinical correlation is advised.   Metta Clines, DO

## 2017-03-17 NOTE — Progress Notes (Signed)
PROGRESS NOTE    Kathleen Dunn  IWP:[id number] DOB: 05-24-1941 DOA: 03/15/2017 PCP: Hali Marry, MD   Chief Complaint  Patient presents with  . Near Syncope    Brief Narrative:  HPI on 03/15/2017 by Dr. Loma Boston Kathleen Dunn is a 76 y.o. female with a history of asthma, hypertension, thyroid disease, anxiety, on Desogen therapy for hormone replacement. Patient was seen at [facility] for near syncope on June 14 and received a head CT which showed no acute intracranial abnormality but evidence of an old small left cerebellar infarct with small vessel ischemic changes in the white matter. Over the past 2 days the patient has had intermittent shaking and "twisting" of her left hand which last for several minutes. She has been feeling lightheaded over the past few days and took Dramamine twice, which preceded the episodes of shaking and twisting. She denies vertigo, chest pain, weakness, sensory changes. Her CT scan today showed worsening areas of low density in the cerebellum consistent with small acute/subacute cerebellar infarctions.  Interim history  Found to have Acute CVA on MRI. Neuro consulted. Plan for cerebral angiogram today.  Assessment & Plan   Acute CVA/Cerebellar stroke -Presented with near syncope, complains of visual changes -Patient currently having word/phrase repetition -CT head: Worsening of areas of low density in the cerebellum consistent with small acute/subacute cerebellar infarctions -MRI brain: Scattered acute infarcts in both cerebellar hemispheres and left PCA territory -MRA head: Age-indeterminate occlusion of the nondominant distal right vertebral artery, severe proximal basilar artery stenosis -Echocardiogram: EF 50-53%, grade 1 diastolic dysfunction. No RWMA -Hemoglobin A1c 5.8 -LDL unable to calculate -PT consulted and patient has no further needs -pending speech and OT consults (patient does live alone) -Continue aspirin, statin,  plavix -Neurology consulted and appreciated -Interventional radiology consulted for cerebral angiogram  Essential hypertension -Continue metoprolol, will hold triamterene/HCTZ  Hyperlipidemia -Lipid panel: Total cholesterol 272, HDL 30, LDL unable to calculate, triglycerides 783 -Currently on statin, however switched to Crestor 40mg  daily and Zetia added  Hypothyroidism -Continue Synthroid  Anxiety -Continue Klonopin as needed  DVT Prophylaxis  Lovenox  Code Status: Full  Family Communication: None at bedside  Disposition Plan: Admitted. Pending further workup and evaluations. Dispo TBD  Consultants Neurology Interventional radiology   Procedures  Echocardiogram  Antibiotics   Anti-infectives    None      Subjective:   Kathleen Dunn seen and examined today.  Patient state it is too early to ask her questions. Denies current chest pain, shortness of breath, abdominal pain, headache.  Objective:   Vitals:   03/16/17 1635 03/16/17 2212 03/17/17 0218 03/17/17 0621  BP: (!) 125/57 (!) 131/56 (!) 146/55 (!) 128/45  Pulse: (!) 56 63 72 (!) 58  Resp: 16 16 16 20   Temp: 97.4 F (36.3 C) 98 F (36.7 C) 97.6 F (36.4 C) 97.4 F (36.3 C)  TempSrc: Oral Oral    SpO2: 97% 97% 95% 94%  Weight:      Height:        Intake/Output Summary (Last 24 hours) at 03/17/17 0942 Last data filed at 03/16/17 1859  Gross per 24 hour  Intake           165.83 ml  Output                0 ml  Net           165.83 ml   Filed Weights   03/15/17 0726  Weight: 86.2 kg (190 lb)  Exam  General: Well developed, well nourished, no distress  HEENT: NCAT, mucous membranes moist.   Cardiovascular: S1 S2 auscultated, no rubs, murmurs or gallops. Regular rate and rhythm.  Respiratory: Clear to auscultation bilaterally with equal chest rise  Abdomen: Soft, nontender, nondistended, + bowel sounds  Extremities: warm dry without cyanosis clubbing or edema  Neuro: AAOx3,  nonfocal  Skin: Without rashes exudates or nodules  Psych: Appropriate and pleasant  Data Reviewed: I have personally reviewed following labs and imaging studies  CBC:  Recent Labs Lab 03/15/17 0753 03/17/17 0630  WBC 8.4 7.3  NEUTROABS 4.5  --   HGB 14.9 13.5  HCT 42.6 40.9  MCV 92.2 93.2  PLT 261 130   Basic Metabolic Panel:  Recent Labs Lab 03/15/17 0753 03/17/17 0630  NA 137 136  K 4.1 4.2  CL 101 104  CO2 28 24  GLUCOSE 116* 98  BUN 14 16  CREATININE 0.79 0.92  CALCIUM 9.5 8.6*  MG 1.7  --    GFR: Estimated Creatinine Clearance: 53.5 mL/min (by C-G formula based on SCr of 0.92 mg/dL). Liver Function Tests:  Recent Labs Lab 03/15/17 0753  AST 21  ALT 18  ALKPHOS 43  BILITOT 0.4  PROT 6.8  ALBUMIN 3.7   No results for input(s): LIPASE, AMYLASE in the last 168 hours. No results for input(s): AMMONIA in the last 168 hours. Coagulation Profile:  Recent Labs Lab 03/17/17 0630  INR 1.09   Cardiac Enzymes: No results for input(s): CKTOTAL, CKMB, CKMBINDEX, TROPONINI in the last 168 hours. BNP (last 3 results) No results for input(s): PROBNP in the last 8760 hours. HbA1C:  Recent Labs  03/16/17 0717  HGBA1C 5.8*   CBG: No results for input(s): GLUCAP in the last 168 hours. Lipid Profile:  Recent Labs  03/16/17 0717  CHOL 272*  HDL 30*  LDLCALC UNABLE TO CALCULATE IF TRIGLYCERIDE OVER 400 mg/dL  TRIG 783*  CHOLHDL 9.1   Thyroid Function Tests: No results for input(s): TSH, T4TOTAL, FREET4, T3FREE, THYROIDAB in the last 72 hours. Anemia Panel: No results for input(s): VITAMINB12, FOLATE, FERRITIN, TIBC, IRON, RETICCTPCT in the last 72 hours. Urine analysis:    Component Value Date/Time   COLORURINE YELLOW 03/15/2017 0825   APPEARANCEUR CLEAR 03/15/2017 0825   LABSPEC 1.010 03/15/2017 0825   PHURINE 6.5 03/15/2017 0825   GLUCOSEU NEGATIVE 03/15/2017 0825   HGBUR NEGATIVE 03/15/2017 0825   BILIRUBINUR NEGATIVE 03/15/2017 0825    BILIRUBINUR neg 02/24/2013 1601   KETONESUR NEGATIVE 03/15/2017 0825   PROTEINUR NEGATIVE 03/15/2017 0825   UROBILINOGEN 0.2 02/24/2013 1601   NITRITE NEGATIVE 03/15/2017 0825   LEUKOCYTESUR NEGATIVE 03/15/2017 0825   Sepsis Labs: @LABRCNTIP (procalcitonin:4,lacticidven:4)  )No results found for this or any previous visit (from the past 240 hour(s)).    Radiology Studies: Mr Brain 15 Contrast  Result Date: 03/15/2017 CLINICAL DATA:  76 year old female with with evidence of new cerebellar infarcts from earlier this month on head CT today performed for abnormal left upper extremity movements and near syncope. EXAM: MRI HEAD WITHOUT CONTRAST MRA HEAD WITHOUT CONTRAST TECHNIQUE: Multiplanar, multiecho pulse sequences of the brain and surrounding structures were obtained without intravenous contrast. Angiographic images of the head were obtained using MRA technique without contrast. COMPARISON:  Head CT 0805 hours today.  Head CT 02/27/2017. FINDINGS: MRI HEAD FINDINGS Brain: Patchy restricted diffusion in both cerebellar hemispheres (series 6, image 12). No brainstem involvement. No deep gray matter nuclei involvement, but there are also several scattered  small foci of restricted diffusion in the left occipital lobe. Associated T2 and FLAIR hyperintensity. No associated hemorrhage or mass effect. No anterior circulation restricted diffusion. No midline shift, mass effect, evidence of mass lesion, ventriculomegaly, extra-axial collection or acute intracranial hemorrhage. Cervicomedullary junction and pituitary are within normal limits. Patchy bilateral cerebral white matter T2 and FLAIR hyperintensity. No chronic cortical encephalomalacia or chronic cerebral blood products are identified. Signal in the deep gray matter nuclei and brainstem is normal for age. Vascular: Major intracranial vascular flow voids are preserved, the distal left vertebral artery appears dominant and is somewhat dolichoectatic.  There is mild generalized intracranial artery tortuosity. Skull and upper cervical spine: Negative. Sinuses/Orbits: Normal orbits soft tissues. Visualized paranasal sinuses and mastoids are stable and well pneumatized. Other: Visible internal auditory structures appear normal. Negative scalp soft tissues. MRA HEAD FINDINGS Antegrade flow in the dominant distal left vertebral artery. No distal left vertebral artery stenosis. Absent flow in the non dominant appearing right vertebral artery V4 segment, although there is faint flow signal in the distal right vertebral artery at the upper cervical spine. The basilar artery is patent, but there is severe proximal basilar stenosis, corresponding to an area of prominent calcified plaque on the CT head today (series 755, image 10). This is proximal to a dominant appearing right AICA origin. The distal basilar is irregular without additional stenosis. SCA and left PCA origins are normal. There is a fetal type right PCA origin. The left posterior communicating artery is diminutive or absent. There is mild irregularity and attenuated distal flow in the left PCA. The distal right PCA appears normal. Antegrade flow in both ICA siphons. Evidence of a tortuous retropharyngeal course of 1 or board carotid arteries in the neck. No ICA siphon stenosis. Normal ophthalmic artery origins. Patent carotid termini. Normal MCA and ACA origins. Dominant left A1 segment. Anterior communicating artery and visible ACA branches are within normal limits. Visible bilateral MCA branches are within normal limits. IMPRESSION: 1. 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 No associated hemorrhage or mass effect. 2. 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 3. 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 4. Attenuated flow in the distal left PCA. 5. Mild to moderate for  age nonspecific cerebral white matter signal changes. 6. Negative anterior circulation. 7. Salient findings discussed by telephone with Dr. Jeanene Erb on 03/15/2017 at 20:33 . Electronically Signed   By: Genevie Ann M.D.   On: 03/15/2017 20:33   Mr Jodene Nam Head/brain IH Cm  Result Date: 03/15/2017 CLINICAL DATA:  76 year old female with with evidence of new cerebellar infarcts from earlier this month on head CT today performed for abnormal left upper extremity movements and near syncope. EXAM: MRI HEAD WITHOUT CONTRAST MRA HEAD WITHOUT CONTRAST TECHNIQUE: Multiplanar, multiecho pulse sequences of the brain and surrounding structures were obtained without intravenous contrast. Angiographic images of the head were obtained using MRA technique without contrast. COMPARISON:  Head CT 0805 hours today.  Head CT 02/27/2017. FINDINGS: MRI HEAD FINDINGS Brain: Patchy restricted diffusion in both cerebellar hemispheres (series 6, image 12). No brainstem involvement. No deep gray matter nuclei involvement, but there are also several scattered small foci of restricted diffusion in the left occipital lobe. Associated T2 and FLAIR hyperintensity. No associated hemorrhage or mass effect. No anterior circulation restricted diffusion. No midline shift, mass effect, evidence of mass lesion, ventriculomegaly, extra-axial collection or  acute intracranial hemorrhage. Cervicomedullary junction and pituitary are within normal limits. Patchy bilateral cerebral white matter T2 and FLAIR hyperintensity. No chronic cortical encephalomalacia or chronic cerebral blood products are identified. Signal in the deep gray matter nuclei and brainstem is normal for age. Vascular: Major intracranial vascular flow voids are preserved, the distal left vertebral artery appears dominant and is somewhat dolichoectatic. There is mild generalized intracranial artery tortuosity. Skull and upper cervical spine: Negative. Sinuses/Orbits: Normal orbits soft tissues.  Visualized paranasal sinuses and mastoids are stable and well pneumatized. Other: Visible internal auditory structures appear normal. Negative scalp soft tissues. MRA HEAD FINDINGS Antegrade flow in the dominant distal left vertebral artery. No distal left vertebral artery stenosis. Absent flow in the non dominant appearing right vertebral artery V4 segment, although there is faint flow signal in the distal right vertebral artery at the upper cervical spine. The basilar artery is patent, but there is severe proximal basilar stenosis, corresponding to an area of prominent calcified plaque on the CT head today (series 755, image 10). This is proximal to a dominant appearing right AICA origin. The distal basilar is irregular without additional stenosis. SCA and left PCA origins are normal. There is a fetal type right PCA origin. The left posterior communicating artery is diminutive or absent. There is mild irregularity and attenuated distal flow in the left PCA. The distal right PCA appears normal. Antegrade flow in both ICA siphons. Evidence of a tortuous retropharyngeal course of 1 or board carotid arteries in the neck. No ICA siphon stenosis. Normal ophthalmic artery origins. Patent carotid termini. Normal MCA and ACA origins. Dominant left A1 segment. Anterior communicating artery and visible ACA branches are within normal limits. Visible bilateral MCA branches are within normal limits. IMPRESSION: 1. 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 No associated hemorrhage or mass effect. 2. 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 3. 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 4. Attenuated flow in the distal left PCA. 5. Mild to moderate for age nonspecific cerebral white matter signal changes. 6. Negative anterior circulation. 7. Salient findings discussed by telephone with Dr.  Jeanene Erb on 03/15/2017 at 20:33 . Electronically Signed   By: Genevie Ann M.D.   On: 03/15/2017 20:33     Scheduled Meds: .  stroke: mapping our early stages of recovery book   Does not apply Once  . aspirin EC  325 mg Oral Daily  . budesonide (PULMICORT) nebulizer solution  0.25 mg Nebulization BID  . clopidogrel  75 mg Oral Daily  . enoxaparin (LOVENOX) injection  40 mg Subcutaneous Q24H  . ezetimibe  10 mg Oral Daily  . famotidine  20 mg Oral BID  . levothyroxine  50 mcg Oral QAC breakfast  . metoprolol tartrate  25 mg Oral Daily  . rosuvastatin  40 mg Oral q1800   Continuous Infusions: . sodium chloride 50 mL/hr at 03/16/17 1806     LOS: 1 day   Time Spent in minutes   30 minutes  Saoirse Legere D.O. on 03/17/2017 at 9:42 AM  Between 7am to 7pm - Pager - [phone]  After 7pm go to [email or web address] - password [email or web address]  And look for the night coverage person covering for me after hours  [facility] Office  [phone]

## 2017-03-17 NOTE — Progress Notes (Signed)
EEG completed, results pending. 

## 2017-03-17 NOTE — Evaluation (Signed)
Speech Language Pathology Evaluation Patient Details Name: Kathleen Dunn MRN: [id number] DOB: [birth year] Today's Date: 03/17/2017 Time: 0160-1093 SLP Time Calculation (min) (ACUTE ONLY): 23 min  Problem List:  Patient Active Problem List   Diagnosis Date Noted  . Stroke (cerebrum) ([place]) 03/15/2017  . GAD (generalized anxiety disorder) 11/06/2015  . Coronary artery calcification 02/17/2015  . IFG (impaired fasting glucose) 01/12/2015  . Lung nodule < 6cm on CT 05/20/2014  . Obesity (BMI 30.0-34.9) 08/26/2013  . Chronic fatigue 02/18/2013  . DEPRESSION 01/08/2008  . FATIGUE 01/08/2008  . Hypothyroidism 12/04/2007  . HYPONATREMIA 12/04/2007  . ABNORMAL MAMMOGRAM 08/25/2007  . HYPERCHOLESTEROLEMIA 06/24/2006  . PERNICIOUS ANEMIA 06/24/2006  . HYPERTENSION, BENIGN SYSTEMIC 06/24/2006  . LOW BACK PAIN 06/24/2006  . TREMOR 06/24/2006   Past Medical History:  Past Medical History:  Diagnosis Date  . Asthma   . Hypertension   . Lung nodule < 6cm on CT   . Thyroid disease    Past Surgical History:  Past Surgical History:  Procedure Laterality Date  . ABDOMINAL HYSTERECTOMY  age 51   oophorectomy  . BREAST SURGERY     benign bx of left breast   HPI:  Kathleen Saundersis a 76 y.o.femalewith a history of asthma, hypertension, thyroid disease, anxiety. Per chart patient was seen at [facility] for near syncope on June 14 and received a head CT which showed no acute intracranial abnormality but evidence of an old small left cerebellar infarct with small vessel ischemic changes in the white matter. Admitted with intermittent shaking and "twisting" of her left hand which last for several minutes. CT scan today showed worsening areas of low density in the cerebellum consistent with small acute/subacute cerebellar infarctions.   Assessment / Plan / Recommendation Clinical Impression  Pt given most subtests of the Cognistat. She demonstrated mild difficulty in the areas of sustained  attention, pragmatics and word retrieval and reports she is presently very tire, hasn't eaten today and isn't thinking "at her best" but agreeable to participate in evaluation. She is tangential and verbose. No family present to confirm but suspect this may baseline personality versus new impairments. She states her cognition was not effected. SLP recommends one additional treatment to further establish pt's cognitive safety and function.       SLP Assessment  SLP Recommendation/Assessment: Patient needs continued Speech Lanaguage Pathology Services SLP Visit Diagnosis: Cognitive communication deficit (R41.841)    Follow Up Recommendations  None    Frequency and Duration min 1 x/week  1 week      SLP Evaluation Cognition  Overall Cognitive Status: Difficult to assess Arousal/Alertness: Awake/alert Orientation Level: Oriented X4 Attention: Sustained Sustained Attention: Impaired Sustained Attention Impairment: Verbal basic Memory: Impaired Memory Impairment: Retrieval deficit Awareness: Appears intact Problem Solving: Appears intact Safety/Judgment: Appears intact       Comprehension  Auditory Comprehension Overall Auditory Comprehension: Appears within functional limits for tasks assessed Interfering Components: Attention Visual Recognition/Discrimination Discrimination: Not tested Reading Comprehension Reading Status:  (TBA)    Expression Expression Primary Mode of Expression: Verbal Verbal Expression Overall Verbal Expression: Appears within functional limits for tasks assessed Level of Generative/Spontaneous Verbalization: Conversation Repetition: No impairment Naming: No impairment Pragmatics: Impairment Impairments: Turn Taking;Topic maintenance Interfering Components: Attention Written Expression Dominant Hand: Right Written Expression:  (TBA)   Oral / Motor  Oral Motor/Sensory Function Overall Oral Motor/Sensory Function: Within functional limits Motor  Speech Overall Motor Speech: Appears within functional limits for tasks assessed Respiration: Within functional limits Phonation: Normal Resonance: Within  functional limits Articulation: Within functional limitis Intelligibility: Intelligible Motor Planning: Witnin functional limits   GO                    Houston Siren 03/17/2017, 3:15 PM  Orbie Pyo Colvin Caroli.[employer] [phone]

## 2017-03-18 ENCOUNTER — Inpatient Hospital Stay (HOSPITAL_COMMUNITY): Payer: Medicare Other

## 2017-03-18 DIAGNOSIS — I6523 Occlusion and stenosis of bilateral carotid arteries: Secondary | ICD-10-CM

## 2017-03-18 DIAGNOSIS — E782 Mixed hyperlipidemia: Secondary | ICD-10-CM

## 2017-03-18 DIAGNOSIS — I651 Occlusion and stenosis of basilar artery: Secondary | ICD-10-CM

## 2017-03-18 MED ORDER — LOSARTAN POTASSIUM 25 MG PO TABS: 12.5000 mg | ORAL_TABLET | Freq: Every day | ORAL | Status: DC

## 2017-03-18 MED ORDER — MELOXICAM 7.5 MG PO TABS
3.7500 mg | ORAL_TABLET | Freq: Every day | ORAL | Status: DC | PRN
  Administered 2017-03-19: 3.75 mg via ORAL
  Filled 2017-03-18: qty 0.5

## 2017-03-18 MED ORDER — LOSARTAN POTASSIUM 50 MG PO TABS
25.0000 mg | ORAL_TABLET | Freq: Every day | ORAL | Status: DC
  Administered 2017-03-19: 25 mg via ORAL
  Filled 2017-03-18 (×2): qty 1

## 2017-03-18 MED ORDER — LORAZEPAM 1 MG PO TABS
1.0000 mg | ORAL_TABLET | Freq: Two times a day (BID) | ORAL | Status: DC | PRN
  Administered 2017-03-18: 1 mg via ORAL
  Filled 2017-03-18: qty 1

## 2017-03-18 MED ORDER — METOPROLOL TARTRATE 12.5 MG HALF TABLET: 12.5000 mg | ORAL_TABLET | Freq: Every day | ORAL | Status: DC

## 2017-03-18 NOTE — Progress Notes (Signed)
PROGRESS NOTE    Kathleen Dunn  XIP:[id number] DOB: 1940/09/17 DOA: 03/15/2017 PCP: Hali Marry, MD   Chief Complaint  Patient presents with  . Near Syncope    Brief Narrative:  HPI on 03/15/2017 by Dr. Loma Boston Kathleen Dunn is a 76 y.o. female with a history of asthma, hypertension, thyroid disease, anxiety, on Desogen therapy for hormone replacement. Patient was seen at [facility] for near syncope on June 14 and received a head CT which showed no acute intracranial abnormality but evidence of an old small left cerebellar infarct with small vessel ischemic changes in the white matter. Over the past 2 days the patient has had intermittent shaking and "twisting" of her left hand which last for several minutes. She has been feeling lightheaded over the past few days and took Dramamine twice, which preceded the episodes of shaking and twisting. She denies vertigo, chest pain, weakness, sensory changes. Her CT scan today showed worsening areas of low density in the cerebellum consistent with small acute/subacute cerebellar infarctions.  Interim history  Found to have Acute CVA on MRI. Neuro consulted. Plan for cerebral angiogram.  Assessment & Plan   Acute CVA/Cerebellar stroke -Presented with near syncope, complains of visual changes -Patient currently having word/phrase repetition -CT head: Worsening of areas of low density in the cerebellum consistent with small acute/subacute cerebellar infarctions -MRI brain: Scattered acute infarcts in both cerebellar hemispheres and left PCA territory -MRA head: Age-indeterminate occlusion of the nondominant distal right vertebral artery, severe proximal basilar artery stenosis -Echocardiogram: EF 67-34%, grade 1 diastolic dysfunction. No RWMA -Hemoglobin A1c 5.8 -LDL unable to calculate -PT and speech consulted and patient has no further needs -pending speech and OT consults (patient does live alone) -Continue aspirin, statin,  plavix -Neurology consulted and appreciated -Interventional radiology consulted for cerebral angiogram- pending.  -Patient is hesitant and wanting more information before having additional testing down. Explained to patient that she should discuss this with her family and neurology.  -EEG: normal    Essential hypertension -Continue metoprolol, will hold triamterene/HCTZ  Hyperlipidemia -Lipid panel: Total cholesterol 272, HDL 30, LDL unable to calculate, triglycerides 783 -Currently on statin, however switched to Crestor 40mg  daily and Zetia added  Hypothyroidism -Continue Synthroid  Anxiety -Continue Klonopin as needed  DVT Prophylaxis  Lovenox  Code Status: Full  Family Communication: None at bedside  Disposition Plan: Admitted. Pending further workup and evaluations. Dispo TBD  Consultants Neurology Interventional radiology   Procedures  Echocardiogram  Antibiotics   Anti-infectives    None      Subjective:   Carrieanne Kleen seen and examined today.  Patient feels she is confused regarding upcoming testing that she is to have. She feels she has received too much information and cannot make a decision. She currently denies chest pain, shortness of breath, abdominal pain, N/V/D/C.   Objective:   Vitals:   03/18/17 0329 03/18/17 0607 03/18/17 0900 03/18/17 0924  BP: (!) 125/52 (!) 126/47    Pulse: (!) 55 (!) 58  66  Resp: 18 20    Temp: 97.8 F (36.6 C) 97.6 F (36.4 C)    TempSrc: Oral Oral    SpO2: 95% 99% 94%   Weight:      Height:        Intake/Output Summary (Last 24 hours) at 03/18/17 1148 Last data filed at 03/18/17 0521  Gross per 24 hour  Intake          1718.33 ml  Output  400 ml  Net          1318.33 ml   Filed Weights   03/15/17 0726  Weight: 86.2 kg (190 lb)   Exam  General: Well developed, well nourished, NAD, appears stated age  42: NCAT, mucous membranes moist.   Cardiovascular: S1 S2 auscultated, RRR, no  murmurs   Respiratory: Clear to auscultation bilaterally with equal chest rise  Abdomen: Soft, nontender, nondistended, + bowel sounds  Extremities: warm dry without cyanosis clubbing or edema  Neuro: AAOx3, nonfocal. Gait normal   Psych: Anxoius, however appropriate  Data Reviewed: I have personally reviewed following labs and imaging studies  CBC:  Recent Labs Lab 03/15/17 0753 03/17/17 0630  WBC 8.4 7.3  NEUTROABS 4.5  --   HGB 14.9 13.5  HCT 42.6 40.9  MCV 92.2 93.2  PLT 261 161   Basic Metabolic Panel:  Recent Labs Lab 03/15/17 0753 03/17/17 0630  NA 137 136  K 4.1 4.2  CL 101 104  CO2 28 24  GLUCOSE 116* 98  BUN 14 16  CREATININE 0.79 0.92  CALCIUM 9.5 8.6*  MG 1.7  --    GFR: Estimated Creatinine Clearance: 53.5 mL/min (by C-G formula based on SCr of 0.92 mg/dL). Liver Function Tests:  Recent Labs Lab 03/15/17 0753  AST 21  ALT 18  ALKPHOS 43  BILITOT 0.4  PROT 6.8  ALBUMIN 3.7   No results for input(s): LIPASE, AMYLASE in the last 168 hours. No results for input(s): AMMONIA in the last 168 hours. Coagulation Profile:  Recent Labs Lab 03/17/17 0630  INR 1.09   Cardiac Enzymes: No results for input(s): CKTOTAL, CKMB, CKMBINDEX, TROPONINI in the last 168 hours. BNP (last 3 results) No results for input(s): PROBNP in the last 8760 hours. HbA1C:  Recent Labs  03/16/17 0717  HGBA1C 5.8*   CBG: No results for input(s): GLUCAP in the last 168 hours. Lipid Profile:  Recent Labs  03/16/17 0717  CHOL 272*  HDL 30*  LDLCALC UNABLE TO CALCULATE IF TRIGLYCERIDE OVER 400 mg/dL  TRIG 783*  CHOLHDL 9.1   Thyroid Function Tests: No results for input(s): TSH, T4TOTAL, FREET4, T3FREE, THYROIDAB in the last 72 hours. Anemia Panel: No results for input(s): VITAMINB12, FOLATE, FERRITIN, TIBC, IRON, RETICCTPCT in the last 72 hours. Urine analysis:    Component Value Date/Time   COLORURINE YELLOW 03/15/2017 0825   APPEARANCEUR CLEAR  03/15/2017 0825   LABSPEC 1.010 03/15/2017 0825   PHURINE 6.5 03/15/2017 0825   GLUCOSEU NEGATIVE 03/15/2017 0825   HGBUR NEGATIVE 03/15/2017 0825   BILIRUBINUR NEGATIVE 03/15/2017 0825   BILIRUBINUR neg 02/24/2013 1601   KETONESUR NEGATIVE 03/15/2017 0825   PROTEINUR NEGATIVE 03/15/2017 0825   UROBILINOGEN 0.2 02/24/2013 1601   NITRITE NEGATIVE 03/15/2017 0825   LEUKOCYTESUR NEGATIVE 03/15/2017 0825   Sepsis Labs: @LABRCNTIP (procalcitonin:4,lacticidven:4)  )No results found for this or any previous visit (from the past 240 hour(s)).    Radiology Studies: Ct Angio Head W Or Wo Contrast  Result Date: 03/17/2017 CLINICAL DATA:  Stroke follow-up. EXAM: CT ANGIOGRAPHY HEAD AND NECK TECHNIQUE: Multidetector CT imaging of the head and neck was performed using the standard protocol during bolus administration of intravenous contrast. Multiplanar CT image reconstructions and MIPs were obtained to evaluate the vascular anatomy. Carotid stenosis measurements (when applicable) are obtained utilizing NASCET criteria, using the distal internal carotid diameter as the denominator. CONTRAST:  50 cc Isovue 350 intravenous COMPARISON:  Brain MRI 03/15/2017 FINDINGS: CT HEAD FINDINGS Brain: Patchy  acute to subacute infarcts in the bilateral cerebrum and parasagittal left occipital lobe as seen on previous brain MRI. No evidence of infarct progression. No hemorrhagic conversion. No hydrocephalus. Chronic microvascular ischemia in the cerebral white matter. Vascular: See below Skull: No acute or aggressive finding Sinuses: Negative Orbits: Negative Review of the MIP images confirms the above findings CTA NECK FINDINGS Aortic arch: Atherosclerosis without aneurysm or acute finding. Three vessel branching. Right carotid system: Atheromatous plaque at the common carotid origin without stenosis. There is prominent noncalcified plaque on the common carotid distally; stenosis measures 40-50%. Moderate calcified plaque at  the common carotid bifurcation with stenosis measuring up to 60%. No superimposed ulceration. ICA tortuosity with vessel reaching the midline retropharynx. Left carotid system: Comparatively mild calcified and noncalcified plaque at the common carotid bifurcation and ICA bulb without stenosis or ulceration. Prominent ICA tortuosity reaching the midline retropharynx. Vertebral arteries: Left dominant vertebral artery that is widely patent to the dura. Thready flow in the diminutive right vertebral artery. Skeleton: No acute or aggressive finding. Other neck: No evidence of incidental mass or inflammation. Upper chest: Calcified granuloma in the left upper lobe. Review of the MIP images confirms the above findings CTA HEAD FINDINGS Anterior circulation: Calcified plaquing in the carotid siphons without flow limiting stenosis. Hypoplastic right A1 segment. No major branch occlusion is seen. There is atheromatous irregularity of bilateral MCA vessels without flow limiting and reversible stenosis. Posterior circulation: No visible flow within the hypoplastic distal right V4 segment. 2 left vertebral stenoses, mild proximally and moderate distally. At the anticipated level of proximal basilar artery there is severe short segment stenosis or occlusion. The mid basilar shows a more moderate narrowing. The right P1 segment is aplastic with fetal type circulation. There is atheromatous irregularity of the left more than right PCA with up to moderate left P2 segment stenoses Venous sinuses: Patent Anatomic variants: Fetal type right PCA Delayed phase: No abnormal intracranial enhancement. Review of the MIP images confirms the above findings IMPRESSION: 1. Advanced atherosclerosis in the head and neck. 2. Short segment atheromatous occlusion or critical stenosis in the proximal basilar. 3. Moderate atheromatous narrowing in the distal left V4 segment and mid basilar. The hypoplastic right vertebral artery that is effectively  occluded at the dura. 4. 60-70% proximal right ICA and 40-50% right common carotid stenosis due to atherosclerotic plaque. 5. Known acute infarcts in the bilateral cerebellum and left occipital lobe (there is a fetal type right PCA). No hemorrhagic conversion or evidence of progression. Electronically Signed   By: Monte Fantasia M.D.   On: 03/17/2017 14:25   Ct Angio Neck W Or Wo Contrast  Result Date: 03/17/2017 CLINICAL DATA:  Stroke follow-up. EXAM: CT ANGIOGRAPHY HEAD AND NECK TECHNIQUE: Multidetector CT imaging of the head and neck was performed using the standard protocol during bolus administration of intravenous contrast. Multiplanar CT image reconstructions and MIPs were obtained to evaluate the vascular anatomy. Carotid stenosis measurements (when applicable) are obtained utilizing NASCET criteria, using the distal internal carotid diameter as the denominator. CONTRAST:  50 cc Isovue 350 intravenous COMPARISON:  Brain MRI 03/15/2017 FINDINGS: CT HEAD FINDINGS Brain: Patchy acute to subacute infarcts in the bilateral cerebrum and parasagittal left occipital lobe as seen on previous brain MRI. No evidence of infarct progression. No hemorrhagic conversion. No hydrocephalus. Chronic microvascular ischemia in the cerebral white matter. Vascular: See below Skull: No acute or aggressive finding Sinuses: Negative Orbits: Negative Review of the MIP images confirms the above findings CTA  NECK FINDINGS Aortic arch: Atherosclerosis without aneurysm or acute finding. Three vessel branching. Right carotid system: Atheromatous plaque at the common carotid origin without stenosis. There is prominent noncalcified plaque on the common carotid distally; stenosis measures 40-50%. Moderate calcified plaque at the common carotid bifurcation with stenosis measuring up to 60%. No superimposed ulceration. ICA tortuosity with vessel reaching the midline retropharynx. Left carotid system: Comparatively mild calcified and  noncalcified plaque at the common carotid bifurcation and ICA bulb without stenosis or ulceration. Prominent ICA tortuosity reaching the midline retropharynx. Vertebral arteries: Left dominant vertebral artery that is widely patent to the dura. Thready flow in the diminutive right vertebral artery. Skeleton: No acute or aggressive finding. Other neck: No evidence of incidental mass or inflammation. Upper chest: Calcified granuloma in the left upper lobe. Review of the MIP images confirms the above findings CTA HEAD FINDINGS Anterior circulation: Calcified plaquing in the carotid siphons without flow limiting stenosis. Hypoplastic right A1 segment. No major branch occlusion is seen. There is atheromatous irregularity of bilateral MCA vessels without flow limiting and reversible stenosis. Posterior circulation: No visible flow within the hypoplastic distal right V4 segment. 2 left vertebral stenoses, mild proximally and moderate distally. At the anticipated level of proximal basilar artery there is severe short segment stenosis or occlusion. The mid basilar shows a more moderate narrowing. The right P1 segment is aplastic with fetal type circulation. There is atheromatous irregularity of the left more than right PCA with up to moderate left P2 segment stenoses Venous sinuses: Patent Anatomic variants: Fetal type right PCA Delayed phase: No abnormal intracranial enhancement. Review of the MIP images confirms the above findings IMPRESSION: 1. Advanced atherosclerosis in the head and neck. 2. Short segment atheromatous occlusion or critical stenosis in the proximal basilar. 3. Moderate atheromatous narrowing in the distal left V4 segment and mid basilar. The hypoplastic right vertebral artery that is effectively occluded at the dura. 4. 60-70% proximal right ICA and 40-50% right common carotid stenosis due to atherosclerotic plaque. 5. Known acute infarcts in the bilateral cerebellum and left occipital lobe (there is a  fetal type right PCA). No hemorrhagic conversion or evidence of progression. Electronically Signed   By: Monte Fantasia M.D.   On: 03/17/2017 14:25     Scheduled Meds: .  stroke: mapping our early stages of recovery book   Does not apply Once  . aspirin EC  325 mg Oral Daily  . budesonide (PULMICORT) nebulizer solution  0.25 mg Nebulization BID  . clopidogrel  75 mg Oral Daily  . enoxaparin (LOVENOX) injection  40 mg Subcutaneous Q24H  . ezetimibe  10 mg Oral Daily  . famotidine  20 mg Oral BID  . levothyroxine  50 mcg Oral QAC breakfast  . [START ON 03/19/2017] losartan  25 mg Oral Daily  . [START ON 03/19/2017] metoprolol tartrate  12.5 mg Oral Daily  . rosuvastatin  40 mg Oral q1800   Continuous Infusions: . sodium chloride 50 mL/hr at 03/18/17 0929     LOS: 2 days   Time Spent in minutes   30 minutes  Taurus Willis D.O. on 03/18/2017 at 11:48 AM  Between 7am to 7pm - Pager - [phone]  After 7pm go to [email or web address] - password [email or web address]  And look for the night coverage person covering for me after hours  [facility] Office  [phone]

## 2017-03-18 NOTE — Progress Notes (Signed)
Patient ID: Kathleen Dunn, female   DOB: 18-Feb-1941, 76 y.o.   MRN: [id number]   Dr Estanislado Pandy has spoken to Dtr and pt at length regarding cerebral arteriogram Plan for procedure 7/5 am  Plan will be to review all results with pt and dtr Will decide if there is any pathology to merit intervention  And if so, discuss all options  With pt and family as to next step. If intervention is being considered---would plan for early next week  Pt and daughter have good understanding of this plan

## 2017-03-18 NOTE — Progress Notes (Signed)
STROKE TEAM PROGRESS NOTE   SUBJECTIVE (INTERVAL HISTORY) No family is at the bedside.  No acute event overnight. Pt stated that she takes metoprolol 12.5mg  at home. BP stable 120s - 130s. CTA neck again showed proximal BA high grade stenosis. Pending cerebral angio in the pm. Pt would like [place] to discuss with her daughter details before proceeding any procedures. Daughter will come at noon.     OBJECTIVE Temp:  [97.4 F (36.3 C)-97.9 F (36.6 C)] 97.6 F (36.4 C) (07/03 0607) Pulse Rate:  [55-83] 66 (07/03 0924) Cardiac Rhythm: Sinus bradycardia (07/03 0700) Resp:  [16-20] 20 (07/03 0607) BP: (117-138)/(47-73) 126/47 (07/03 0607) SpO2:  [94 %-99 %] 94 % (07/03 0900)  CBC:   Recent Labs Lab 03/15/17 0753 03/17/17 0630  WBC 8.4 7.3  NEUTROABS 4.5  --   HGB 14.9 13.5  HCT 42.6 40.9  MCV 92.2 93.2  PLT 261 466    Basic Metabolic Panel:   Recent Labs Lab 03/15/17 0753 03/17/17 0630  NA 137 136  K 4.1 4.2  CL 101 104  CO2 28 24  GLUCOSE 116* 98  BUN 14 16  CREATININE 0.79 0.92  CALCIUM 9.5 8.6*  MG 1.7  --     Lipid Panel:     Component Value Date/Time   CHOL 272 (H) 03/16/2017 0717   TRIG 783 (H) 03/16/2017 0717   TRIG 239 03/07/2009   HDL 30 (L) 03/16/2017 0717   CHOLHDL 9.1 03/16/2017 0717   VLDL UNABLE TO CALCULATE IF TRIGLYCERIDE OVER 400 mg/dL 03/16/2017 0717   LDLCALC UNABLE TO CALCULATE IF TRIGLYCERIDE OVER 400 mg/dL 03/16/2017 0717   HgbA1c:  Lab Results  Component Value Date   HGBA1C 5.8 (H) 03/16/2017   Urine Drug Screen:     Component Value Date/Time   LABOPIA NONE DETECTED 03/15/2017 0825   COCAINSCRNUR NONE DETECTED 03/15/2017 0825   LABBENZ NONE DETECTED 03/15/2017 0825   AMPHETMU NONE DETECTED 03/15/2017 0825   THCU NONE DETECTED 03/15/2017 0825   LABBARB NONE DETECTED 03/15/2017 0825    Alcohol Level No results found for: [place] I have personally reviewed the radiological images below and agree with the radiology  interpretations.  Ct Head Wo Contrast 03/15/2017 Worsening of areas of low-density in the cerebellum consistent with small acute/ subacute cerebellar infarctions. No sign of hemorrhage or mass effect.    Mr Jodene Nam Head/brain Wo Cm 03/15/2017 1. 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 No associated hemorrhage or mass effect.  2. 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  3. 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  4. Attenuated flow in the distal left PCA.  5. Mild to moderate for age nonspecific cerebral white matter signal changes.  6. Negative anterior circulation.   CTA head and neck  1. Advanced atherosclerosis in the head and neck. 2. Short segment atheromatous occlusion or critical stenosis in the proximal basilar. 3. Moderate atheromatous narrowing in the distal left V4 segment and mid basilar. The hypoplastic right vertebral artery that is effectively occluded at the dura. 4. 60-70% proximal right ICA and 40-50% right common carotid stenosis due to atherosclerotic plaque. 5. Known acute infarcts in the bilateral cerebellum and left occipital lobe (there is a fetal type right PCA). No hemorrhagic conversion or evidence of progression.  DSA pending  TTE - Left ventricle: The cavity size was normal. Wall thickness  was   increased in a pattern of moderate LVH. Systolic function was   normal. The estimated ejection fraction was in the range of 60%   to 65%. Wall motion was normal; there were no regional wall   motion abnormalities. Doppler parameters are consistent with   abnormal left ventricular relaxation (grade 1 diastolic   dysfunction). - Aortic valve: There was trivial regurgitation. - Pericardium, extracardiac: A trivial pericardial effusion was   identified.  EEG - A normal EEG does not exclude a clinical diagnosis of epilepsy.  If  further clinical questions remain, prolonged EEG may be helpful.  Clinical correlation is advised.   PHYSICAL EXAM  Temp:  [97.4 F (36.3 C)-97.9 F (36.6 C)] 97.6 F (36.4 C) (07/03 0607) Pulse Rate:  [55-83] 66 (07/03 0924) Resp:  [16-20] 20 (07/03 0607) BP: (117-138)/(47-73) 126/47 (07/03 0607) SpO2:  [94 %-99 %] 94 % (07/03 0900)  General - Well nourished, well developed, in no apparent distress.  Ophthalmologic - Fundi not visualized due to noncooperation.  Cardiovascular - Regular rate and rhythm.  Mental Status -  Level of arousal and orientation to time, place, and person were intact. Language including expression, naming, repetition, comprehension was assessed and found intact. Fund of Knowledge was assessed and was intact.  Cranial Nerves II - XII - II - Visual field intact OU. III, IV, VI - Extraocular movements intact. V - Facial sensation intact bilaterally. VII - Facial movement intact bilaterally. VIII - Hearing & vestibular intact bilaterally. X - Palate elevates symmetrically. XI - Chin turning & shoulder shrug intact bilaterally. XII - Tongue protrusion intact.  Motor Strength - The patient's strength was normal in all extremities and pronator drift was absent.  Bulk was normal and fasciculations were absent.   Motor Tone - Muscle tone was assessed at the neck and appendages and was normal.  Reflexes - The patient's reflexes were 1+ in all extremities and she had no pathological reflexes.  Sensory - Light touch, temperature/pinprick were assessed and were symmetrical.    Coordination - The patient had normal movements in the hands and feet with no ataxia or dysmetria.  Essential tremor was present b/l, more on the right than left.  Gait and Station - deferred   ASSESSMENT/PLAN [name] is a 76 y.o. female with history of hypertension, asthma, known lung nodule, and thyroid disease presenting with uncontrollable movements of the left upper  extremity. She did not receive IV t-PA due to transient deficits.  Strokes:  scattered acute infarcts in both cerebellar hemispheres and the Lt PCA territory - likely embolic from [facility].  Resultant  No deficit  CT head - small acute/ subacute cerebellar infarctions.  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  CTA H&N - proximal BA high-grade stenosis, mid BA atherosclerosis with post stenotic dilatation, left VA atherosclerosis, right VA hypoplastic with distal occlusion, proximal right ICA 60-70% stenosis and proximal left ICA 40-50% stenosis.  Cerebral angio - pending  EEG - normal  2D Echo - unremarkable  LDL - unable to calculate - triglycerides 783  HgbA1c 5.8  VTE prophylaxis - Lovenox Diet NPO time specified Except for: Sips with Meds  No antithrombotic prior to admission, now on aspirin 325 mg daily and clopidogrel 75 mg daily.  Patient counseled to be compliant with her antithrombotic medications  Ongoing aggressive stroke risk factor management  Therapy recommendations: pending  Disposition:  Pending  Posterior circulation extensive athero  MRA showed BA severe stenosis, right VA occlusion and left PCA decreased flow  CTA head and neck proximal BA high-grade stenosis, mid BA atherosclerosis with post stenotic dilatation, left VA atherosclerosis, right VA hypoplastic with distal occlusion  DSA pending  On DAPT with plavix load  Carotid stenosis  CTA head and neck - proximal right ICA 60-70% stenosis and proximal left ICA 40-50% stenosis.  DSA pending  On DAPT  Hypertension  Stable  On home meds - low dose metoprolol and losartan BP goal 130-160 due to intracranial stenosis  Hyperlipidemia  Home meds:  No lipid lowering medications prior to admission  LDL could not be calculated secondary to  elevated triglycerides, goal < 70  Now on crestor 40 and zetia 10  Continue at discharge  Other Stroke Risk Factors  Advanced age  Former cigarette smoker - quit 45 years ago.  Obesity, Body mass index is 35.9 kg/m., recommend weight loss, diet and exercise as appropriate   Depo Estradiol PTA  Other Active Problems  Essential tremor - on low-dose metoprolol  Hospital day # 2  Kathleen Hawking, MD PhD Stroke Neurology 03/18/2017 12:24 PM   To contact Stroke Continuity provider, please refer to [email or web address] After hours, contact General Neurology

## 2017-03-18 NOTE — Progress Notes (Signed)
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  Patient was scheduled for today.   Attempted to bring patient to unit this AM, however she refused and stated she wished to speak with MD.  This PA and Dr. Estanislado Pandy went to patient's room this AM.  Spent >30 minutes with patient discussing plans and answering questions.  PA Saverio Danker also spent time with patient on 03/16/18 discussing procedure, benefits, risks, and answering questions.  Patient requests that a family member be present for discussion.  She states her daughter will be here at noon today.  Plan was made to meet with patient, daughter, and Dr. Estanislado Pandy around noon today.  Floor RN is to call IR once daughter available.  IR has been in contact with floor RN to continue to work towards meeting with patient and her daughter.  RN has not yet heard from patient's daughter.  Patient was informed that a delay in proceeding today would likely delay her procedure until Thursday.  Will allow patient to eat today and place NPO after midnight on Thursday in the hopes that based on schedule and availability of daughter to meet that angiogram can be scheduled this week.   Brynda Greathouse, MMS RDN PA-C

## 2017-03-18 NOTE — Progress Notes (Signed)
Physical Therapy Treatment Patient Details Name: Kathleen Dunn MRN: [id number] DOB: Jan 04, 1941 Today's Date: 03/18/2017    History of Present Illness Pt is a 76 yo female admitted through ED on 03/15/17 due to an episode of L arm twisting, dizziness and fatigue. Pt was diagnosed with subacute cerebellar stroke. PMH significant for asthma, HTN, thyroid disease, anxiety.      PT Comments    Patient more unsteady on her feet today and required min guard/min A for balance with ambulation.  Pt scored 14 on DGI. Pt reported feeling tired and has been NPO today. Continue to progress as tolerated.   Follow Up Recommendations  No PT follow up     Equipment Recommendations  None recommended by PT    Recommendations for Other Services       Precautions / Restrictions Precautions Precautions: Fall Restrictions Weight Bearing Restrictions: No    Mobility  Bed Mobility Overal bed mobility: Modified Independent             General bed mobility comments: increased time/effort  Transfers Overall transfer level: Needs assistance Equipment used: None Transfers: Sit to/from Stand Sit to Stand: Min guard         General transfer comment: min guard for safety; pt a little unsteady upon standing and reached for sink to hold onto  Ambulation/Gait Ambulation/Gait assistance: Min guard;Min assist Ambulation Distance (Feet): 300 Feet Assistive device: None Gait Pattern/deviations: Step-through pattern;Decreased stride length;Drifts right/left;Wide base of support Gait velocity: decreased   General Gait Details: pt unsteady at times with LOB X2 with horizontal head turn and when reaching outside BOS requiring min A to recover   Stairs Stairs: Yes   Stair Management: Two rails;Forwards;Step to pattern Number of Stairs: 2 General stair comments: min guard for safety  Wheelchair Mobility    Modified Rankin (Stroke Patients Only)       Balance Overall balance assessment:  Needs assistance Sitting-balance support: No upper extremity supported;Feet supported Sitting balance-Leahy Scale: Normal     Standing balance support: No upper extremity supported Standing balance-Leahy Scale: Fair                   Standardized Balance Assessment Standardized Balance Assessment : Dynamic Gait Index   Dynamic Gait Index Level Surface: Mild Impairment Change in Gait Speed: Mild Impairment Gait with Horizontal Head Turns: Moderate Impairment Gait with Vertical Head Turns: Mild Impairment Gait and Pivot Turn: Mild Impairment Step Over Obstacle: Mild Impairment Step Around Obstacles: Mild Impairment Steps: Moderate Impairment Total Score: 14      Cognition Arousal/Alertness: Awake/alert Behavior During Therapy: WFL for tasks assessed/performed Overall Cognitive Status: Within Functional Limits for tasks assessed                                        Exercises      General Comments        Pertinent Vitals/Pain Pain Assessment: No/denies pain    Home Living                      Prior Function            PT Goals (current goals can now be found in the care plan section) Acute Rehab PT Goals Patient Stated Goal: to feel better and get back home PT Goal Formulation: With patient Time For Goal Achievement: 03/30/17 Potential to Achieve Goals: Good Progress  towards PT goals: Progressing toward goals    Frequency    Min 3X/week      PT Plan Current plan remains appropriate    Co-evaluation              AM-PAC PT "6 Clicks" Daily Activity  Outcome Measure  Difficulty turning over in bed (including adjusting bedclothes, sheets and blankets)?: None Difficulty moving from lying on back to sitting on the side of the bed? : None Difficulty sitting down on and standing up from a chair with arms (e.g., wheelchair, bedside commode, etc,.)?: A Little Help needed moving to and from a bed to chair (including a  wheelchair)?: A Little Help needed walking in hospital room?: A Little Help needed climbing 3-5 steps with a railing? : A Little 6 Click Score: 20    End of Session Equipment Utilized During Treatment: Gait belt Activity Tolerance: Patient tolerated treatment well Patient left: with call bell/phone within reach;in bed Nurse Communication: Mobility status PT Visit Diagnosis: Unsteadiness on feet (R26.81)     Time: 5501-5868 PT Time Calculation (min) (ACUTE ONLY): 23 min  Charges:  $Gait Training: 23-37 mins                    G Codes:       Earney Navy, PTA Pager: [phone]     Darliss Cheney 03/18/2017, 4:13 PM

## 2017-03-18 NOTE — Evaluation (Signed)
Occupational Therapy Evaluation Patient Details Name: Kathleen Dunn MRN: [id number] DOB: 09-22-1940 Today's Date: 03/18/2017    History of Present Illness Pt is a 76 yo female admitted through ED on 03/15/17 due to an episode of L arm twisting, dizziness and fatigue. Pt was diagnosed with subacute cerebellar stroke. PMH significant for asthma, HTN, thyroid disease, anxiety.     Clinical Impression   PTA, pt was independent with ADL and functional mobility and was living alone. She reports independence with all home management tasks. She currently requires overall supervision for ADL participation due to decreased dynamic standing balance and decreased activity tolerance for ADL. Initiated educated concerning safety and energy conservation during recovery. Pt noted to have decreased smoothness of horizontal visual tracking this session but no difficulties with functional use of vision. She would benefit from continued OT services while admitted to maximize safety and independence with ADL and functional mobility in order to facilitate safe D/C home.    Follow Up Recommendations  No OT follow up    Equipment Recommendations  3 in 1 bedside commode    Recommendations for Other Services       Precautions / Restrictions Precautions Precautions: Fall Restrictions Weight Bearing Restrictions: No      Mobility Bed Mobility Overal bed mobility: Modified Independent                Transfers Overall transfer level: Needs assistance Equipment used: None Transfers: Sit to/from Stand Sit to Stand: Supervision         General transfer comment: Supervision for safety.     Balance Overall balance assessment: Needs assistance Sitting-balance support: No upper extremity supported;Feet supported Sitting balance-Leahy Scale: Normal     Standing balance support: No upper extremity supported;During functional activity Standing balance-Leahy Scale: Fair Standing balance comment:  Reaching for furniture                           ADL either performed or assessed with clinical judgement   ADL Overall ADL's : Needs assistance/impaired Eating/Feeding: Set up;Sitting   Grooming: Supervision/safety;Standing   Upper Body Bathing: Set up;Sitting   Lower Body Bathing: Supervison/ safety;Sit to/from stand   Upper Body Dressing : Set up;Sitting   Lower Body Dressing: Supervision/safety;Sit to/from stand   Toilet Transfer: Supervision/safety;Ambulation;Regular Toilet   Toileting- Clothing Manipulation and Hygiene: Set up;Sitting/lateral lean       Functional mobility during ADLs: Supervision/safety General ADL Comments: Pt able to demonstrate alternating attention to complete higher level cognitive tasks while ambulating in hallway.      Vision Baseline Vision/History: Wears glasses Wears Glasses:  (for "close up only") Patient Visual Report: No change from baseline Vision Assessment?: Yes Eye Alignment: Within Functional Limits Ocular Range of Motion: Within Functional Limits Alignment/Gaze Preference: Within Defined Limits Tracking/Visual Pursuits: Decreased smoothness of horizontal tracking Saccades: Within functional limits Convergence: Within functional limits Visual Fields: No apparent deficits Additional Comments: Pt with slight nystagmus during horizontal tracking bilaterally. No functional impact on ADL participation noted.      Perception     Praxis      Pertinent Vitals/Pain Pain Assessment: Faces Faces Pain Scale: Hurts a little bit Pain Location: generalized Pain Intervention(s): Monitored during session     Hand Dominance Right   Extremity/Trunk Assessment Upper Extremity Assessment Upper Extremity Assessment: Overall WFL for tasks assessed   Lower Extremity Assessment Lower Extremity Assessment: Generalized weakness       Communication Communication Communication: No difficulties  Cognition Arousal/Alertness:  Awake/alert Behavior During Therapy: WFL for tasks assessed/performed Overall Cognitive Status: Difficult to assess                                     General Comments       Exercises     Shoulder Instructions      Home Living Family/patient expects to be discharged to:: Private residence Living Arrangements: Alone Available Help at Discharge: Family;Available PRN/intermittently Type of Home: Apartment Home Access: Elevator     Home Layout: One level     Bathroom Shower/Tub: Occupational psychologist: Standard     Home Equipment: Kasandra Knudsen - single point      Lives With: Alone    Prior Functioning/Environment Level of Independence: Independent        Comments: Does all of her home management and drives.        OT Problem List: Impaired balance (sitting and/or standing);Decreased activity tolerance;Decreased safety awareness;Decreased knowledge of use of DME or AE;Decreased knowledge of precautions      OT Treatment/Interventions: Self-care/ADL training;Therapeutic exercise;Energy conservation;DME and/or AE instruction;Therapeutic activities;Patient/family education;Balance training    OT Goals(Current goals can be found in the care plan section) Acute Rehab OT Goals Patient Stated Goal: to feel better and get back home OT Goal Formulation: With patient Time For Goal Achievement: 04/01/17 Potential to Achieve Goals: Good ADL Goals Pt Will Perform Grooming: Independently;standing Pt Will Perform Upper Body Dressing: Independently;sitting Pt Will Perform Lower Body Dressing: Independently;sit to/from stand Pt Will Transfer to Toilet: Independently;ambulating;regular height toilet Pt Will Perform Toileting - Clothing Manipulation and hygiene: Independently;sit to/from stand Pt Will Perform Tub/Shower Transfer: Shower transfer;Independently;3 in 1;ambulating  OT Frequency: Min 2X/week   Barriers to D/C:            Co-evaluation               AM-PAC PT "6 Clicks" Daily Activity     Outcome Measure Help from another person eating meals?: None Help from another person taking care of personal grooming?: A Little Help from another person toileting, which includes using toliet, bedpan, or urinal?: A Little Help from another person bathing (including washing, rinsing, drying)?: A Little Help from another person to put on and taking off regular upper body clothing?: None Help from another person to put on and taking off regular lower body clothing?: A Little 6 Click Score: 20   End of Session Equipment Utilized During Treatment: Gait belt  Activity Tolerance: Patient tolerated treatment well Patient left: in bed;with call bell/phone within reach (with IR MD and PA present)  OT Visit Diagnosis: Unsteadiness on feet (R26.81);Muscle weakness (generalized) (M62.81)                Time: 2841-3244 OT Time Calculation (min): 39 min Charges:  OT General Charges $OT Visit: 1 Procedure OT Evaluation $OT Eval Moderate Complexity: 1 Procedure OT Treatments $Self Care/Home Management : 23-37 mins G-Codes:     Norman Herrlich, MS OTR/L  Pager: Kathleen Dunn 03/18/2017, 12:10 PM

## 2017-03-19 LAB — GLUCOSE, CAPILLARY: Glucose-Capillary: 124 mg/dL — ABNORMAL HIGH (ref 65–99)

## 2017-03-19 MED ORDER — CLONAZEPAM 1 MG PO TABS
1.0000 mg | ORAL_TABLET | Freq: Two times a day (BID) | ORAL | Status: DC | PRN
  Administered 2017-03-19 – 2017-03-20 (×3): 1 mg via ORAL
  Filled 2017-03-19 (×3): qty 1

## 2017-03-19 MED ORDER — SODIUM CHLORIDE 0.9 % IV SOLN
INTRAVENOUS | Status: AC
  Administered 2017-03-19: 19:00:00 via INTRAVENOUS

## 2017-03-19 MED ORDER — CLONAZEPAM 1 MG PO TABS
2.0000 mg | ORAL_TABLET | Freq: Every evening | ORAL | Status: DC | PRN
  Filled 2017-03-19: qty 2

## 2017-03-19 MED ORDER — DICLOFENAC SODIUM 1 % TD GEL
2.0000 g | Freq: Four times a day (QID) | TRANSDERMAL | Status: DC | PRN
  Administered 2017-03-20 – 2017-03-31 (×9): 2 g via TOPICAL
  Filled 2017-03-19 (×3): qty 100

## 2017-03-19 NOTE — Progress Notes (Signed)
Patient ID: Kathleen Dunn, female   DOB: 04-14-1941, 76 y.o.   MRN: [id number]                                                                PROGRESS NOTE                                                                                                                                                                                                             Patient Demographics:    Kathleen Dunn, is a 76 y.o. female, DOB - Oct 28, 1940, WUJ:[id number]  Admit date - 03/15/2017   Admitting Physician Truett Mainland, DO  Outpatient Primary MD for the patient is Hali Marry, MD  LOS - 3  Outpatient Specialists:     Chief Complaint  Patient presents with  . Near Syncope       Brief Narrative    HPI on 03/15/2017 by Dr. Loma Boston Zell Saundersis a 76 y.o.femalewith a history of asthma, hypertension, thyroid disease, anxiety, on Desogen therapy for hormone replacement. Patient was seen at [facility] for near syncope on June 14 and received a head CT which showed no acute intracranial abnormality but evidence of an old small left cerebellar infarct with small vessel ischemic changes in the white matter. Over the past 2 days the patient has had intermittent shaking and "twisting" of her left hand which last for several minutes. She has been feeling lightheaded over the past few days and took Dramamine twice, which preceded the episodes of shaking and twisting. She denies vertigo, chest pain, weakness, sensory changes. Her CT scan today showed worsening areas of low density in the cerebellum consistent with small acute/subacute cerebellar infarctions.  Interim history  Found to have Acute CVA on MRI. Neuro consulted. Plan for cerebral angiogram.   Subjective:    Angy Swearengin today has severe anxiety.  Clonazepam has worked very well in the past.  Pt also requesting something for knee pain.  ,No headache presently , No chest pain, No abdominal pain - No Nausea, No new weakness  tingling or numbness, No Cough - SOB.    Assessment  & Plan :    Principal Problem:   Cerebellar stroke ([place]) Active Problems:   Hypothyroidism   HYPERCHOLESTEROLEMIA   HYPERTENSION, BENIGN SYSTEMIC   GAD (generalized  anxiety disorder)   Mixed hyperlipidemia   Basilar artery stenosis   Bilateral carotid artery stenosis   Acute CVA/Cerebellar stroke -Presented with near syncope, complains of visual changes -Patient currently having word/phrase repetition -CT head: Worsening of areas of low density in the cerebellum consistent with small acute/subacute cerebellar infarctions -MRI brain: Scattered acute infarcts in both cerebellar hemispheres and left PCA territory -MRA head: Age-indeterminate occlusion of the nondominant distal right vertebral artery, severe proximal basilar artery stenosis -Echocardiogram: EF 73-53%, grade 1 diastolic dysfunction. No RWMA -Hemoglobin A1c 5.8 -LDL unable to calculate -PT and speech consulted and patient has no further needs -pending speech and OT consults (patient does live alone) -Continue aspirin, statin, plavix -Neurology consulted and appreciated -Interventional radiology consulted for cerebral angiogram- pending.  -Patient is hesitant and wanting more information before having additional testing down. Explained to patient that she should discuss this with her family and neurology.  -EEG: normal   Cerebral angiogram 7/5 NPO afeter [place]  Essential hypertension -Continue metoprolol, will hold triamterene/HCTZ  Hyperlipidemia -Lipid panel: Total cholesterol 272, HDL 30, LDL unable to calculate, triglycerides 783 -Currently on statin, however switched to Crestor 40mg  daily and Zetia added Add vascepa  Hypothyroidism -Continue Synthroid  Anxiety -d/c lorazepam. Continue Klonopin as needed  Knee pain voltaren gel 2gm topically qid prn   DVT Prophylaxis  Lovenox  Code Status: Full  Family Communication: None at  bedside  Disposition Plan: Admitted. Pending further workup and evaluations. Dispo TBD  Consultants Neurology Interventional radiology   Procedures  Echocardiogram 7/1=>EF 60-65%, trivial pericardial effusion, grade 1 diastolic dysfunction MRI brain 6/30=>  IMPRESSION: 1. 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 No associated hemorrhage or mass effect. 2. 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 3. 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 4. Attenuated flow in the distal left PCA. 5. Mild to moderate for age nonspecific cerebral white matter signal changes. 6. Negative anterior circulation.  CTA head/neck 7/2=> IMPRESSION: 1. Advanced atherosclerosis in the head and neck. 2. Short segment atheromatous occlusion or critical stenosis in the proximal basilar. 3. Moderate atheromatous narrowing in the distal left V4 segment and mid basilar. The hypoplastic right vertebral artery that is effectively occluded at the dura. 4. 60-70% proximal right ICA and 40-50% right common carotid stenosis due to atherosclerotic plaque. 5. Known acute infarcts in the bilateral cerebellum and left occipital lobe (there is a fetal type right PCA). No hemorrhagic conversion or evidence of progression.  Antibiotics      Anti-infectives    None      Lab Results  Component Value Date   PLT 232 03/17/2017      Anti-infectives    None        Objective:   Vitals:   03/18/17 0924 03/18/17 1515 03/18/17 2157 03/19/17 0615  BP:  117/64 (!) 123/56 (!) 132/51  Pulse: 66 60 64 (!) 54  Resp:  16 17 18   Temp:  97.7 F (36.5 C) (!) 97.5 F (36.4 C) (!) 97.4 F (36.3 C)  TempSrc:  Oral Oral Oral  SpO2:  98% 97% 97%  Weight:      Height:        Wt Readings from Last 3 Encounters:  03/15/17 86.2 kg (190 lb)  02/27/17 86.2 kg (190 lb)   11/03/15 82.6 kg (182 lb)     Intake/Output Summary (Last 24 hours) at  03/19/17 0952 Last data filed at 03/18/17 1800  Gross per 24 hour  Intake              220 ml  Output             1050 ml  Net             -830 ml     Physical Exam  Awake Alert, Oriented X 3, No new F.N deficits, Normal affect [place].AT,PERRAL Supple Neck,No JVD, No cervical lymphadenopathy appriciated.  Symmetrical Chest wall movement, Good air movement bilaterally, CTAB RRR,No Gallops,Rubs or new Murmurs, No Parasternal Heave +ve B.Sounds, Abd Soft, No tenderness, No organomegaly appriciated, No rebound - guarding or rigidity. No Cyanosis, Clubbing or edema, No new Rash or bruise  , slight crepitus bilateral knee    Data Review:    CBC  Recent Labs Lab 03/15/17 0753 03/17/17 0630  WBC 8.4 7.3  HGB 14.9 13.5  HCT 42.6 40.9  PLT 261 232  MCV 92.2 93.2  MCH 32.3 30.8  MCHC 35.0 33.0  RDW 13.2 13.3  LYMPHSABS 2.6  --   MONOABS 0.9  --   EOSABS 0.4  --   BASOSABS 0.0  --     Chemistries   Recent Labs Lab 03/15/17 0753 03/17/17 0630  NA 137 136  K 4.1 4.2  CL 101 104  CO2 28 24  GLUCOSE 116* 98  BUN 14 16  CREATININE 0.79 0.92  CALCIUM 9.5 8.6*  MG 1.7  --   AST 21  --   ALT 18  --   ALKPHOS 43  --   BILITOT 0.4  --    ------------------------------------------------------------------------------------------------------------------ No results for input(s): CHOL, HDL, LDLCALC, TRIG, CHOLHDL, LDLDIRECT in the last 72 hours.  Lab Results  Component Value Date   HGBA1C 5.8 (H) 03/16/2017   ------------------------------------------------------------------------------------------------------------------ No results for input(s): TSH, T4TOTAL, T3FREE, THYROIDAB in the last 72 hours.  Invalid input(s): FREET3 ------------------------------------------------------------------------------------------------------------------ No results for input(s): VITAMINB12, FOLATE, FERRITIN, TIBC,  IRON, RETICCTPCT in the last 72 hours.  Coagulation profile  Recent Labs Lab 03/17/17 0630  INR 1.09    No results for input(s): DDIMER in the last 72 hours.  Cardiac Enzymes No results for input(s): CKMB, TROPONINI, MYOGLOBIN in the last 168 hours.  Invalid input(s): CK ------------------------------------------------------------------------------------------------------------------ No results found for: BNP  Inpatient Medications  Scheduled Meds: .  stroke: mapping our early stages of recovery book   Does not apply Once  . aspirin EC  325 mg Oral Daily  . budesonide (PULMICORT) nebulizer solution  0.25 mg Nebulization BID  . clopidogrel  75 mg Oral Daily  . enoxaparin (LOVENOX) injection  40 mg Subcutaneous Q24H  . ezetimibe  10 mg Oral Daily  . famotidine  20 mg Oral BID  . levothyroxine  50 mcg Oral QAC breakfast  . losartan  25 mg Oral Daily  . metoprolol tartrate  12.5 mg Oral Daily  . rosuvastatin  40 mg Oral q1800   Continuous Infusions: . sodium chloride 50 mL/hr at 03/19/17 0326   PRN Meds:.acetaminophen **OR** acetaminophen (TYLENOL) oral liquid 160 mg/5 mL **OR** acetaminophen, acetic acid-hydrocortisone, LORazepam, meloxicam, ondansetron (ZOFRAN) IV, senna-docusate  Micro Results No results found for this or any previous visit (from the past 240 hour(s)).  Radiology Reports Ct Angio Head W Or Wo Contrast  Result Date: 03/17/2017 CLINICAL DATA:  Stroke follow-up. EXAM: CT ANGIOGRAPHY HEAD AND NECK TECHNIQUE: Multidetector CT imaging of the head and neck was performed  using the standard protocol during bolus administration of intravenous contrast. Multiplanar CT image reconstructions and MIPs were obtained to evaluate the vascular anatomy. Carotid stenosis measurements (when applicable) are obtained utilizing NASCET criteria, using the distal internal carotid diameter as the denominator. CONTRAST:  50 cc Isovue 350 intravenous COMPARISON:  Brain MRI 03/15/2017  FINDINGS: CT HEAD FINDINGS Brain: Patchy acute to subacute infarcts in the bilateral cerebrum and parasagittal left occipital lobe as seen on previous brain MRI. No evidence of infarct progression. No hemorrhagic conversion. No hydrocephalus. Chronic microvascular ischemia in the cerebral white matter. Vascular: See below Skull: No acute or aggressive finding Sinuses: Negative Orbits: Negative Review of the MIP images confirms the above findings CTA NECK FINDINGS Aortic arch: Atherosclerosis without aneurysm or acute finding. Three vessel branching. Right carotid system: Atheromatous plaque at the common carotid origin without stenosis. There is prominent noncalcified plaque on the common carotid distally; stenosis measures 40-50%. Moderate calcified plaque at the common carotid bifurcation with stenosis measuring up to 60%. No superimposed ulceration. ICA tortuosity with vessel reaching the midline retropharynx. Left carotid system: Comparatively mild calcified and noncalcified plaque at the common carotid bifurcation and ICA bulb without stenosis or ulceration. Prominent ICA tortuosity reaching the midline retropharynx. Vertebral arteries: Left dominant vertebral artery that is widely patent to the dura. Thready flow in the diminutive right vertebral artery. Skeleton: No acute or aggressive finding. Other neck: No evidence of incidental mass or inflammation. Upper chest: Calcified granuloma in the left upper lobe. Review of the MIP images confirms the above findings CTA HEAD FINDINGS Anterior circulation: Calcified plaquing in the carotid siphons without flow limiting stenosis. Hypoplastic right A1 segment. No major branch occlusion is seen. There is atheromatous irregularity of bilateral MCA vessels without flow limiting and reversible stenosis. Posterior circulation: No visible flow within the hypoplastic distal right V4 segment. 2 left vertebral stenoses, mild proximally and moderate distally. At the  anticipated level of proximal basilar artery there is severe short segment stenosis or occlusion. The mid basilar shows a more moderate narrowing. The right P1 segment is aplastic with fetal type circulation. There is atheromatous irregularity of the left more than right PCA with up to moderate left P2 segment stenoses Venous sinuses: Patent Anatomic variants: Fetal type right PCA Delayed phase: No abnormal intracranial enhancement. Review of the MIP images confirms the above findings IMPRESSION: 1. Advanced atherosclerosis in the head and neck. 2. Short segment atheromatous occlusion or critical stenosis in the proximal basilar. 3. Moderate atheromatous narrowing in the distal left V4 segment and mid basilar. The hypoplastic right vertebral artery that is effectively occluded at the dura. 4. 60-70% proximal right ICA and 40-50% right common carotid stenosis due to atherosclerotic plaque. 5. Known acute infarcts in the bilateral cerebellum and left occipital lobe (there is a fetal type right PCA). No hemorrhagic conversion or evidence of progression. Electronically Signed   By: Monte Fantasia M.D.   On: 03/17/2017 14:25   Dg Lumbar Spine Complete  Result Date: 03/15/2017 CLINICAL DATA:  Pt complains of lower back pain x 5 days with fall, near syncopal episode and dizziness x 2 days EXAM: LUMBAR SPINE - COMPLETE 4+ VIEW COMPARISON:  None. FINDINGS: No fracture.  No spondylolisthesis. Moderate to marked loss of disc height at L1-L2 with endplate sclerosis and osteophytes. Mild loss disc height at T12-L1 and L3-L4. There facet degenerative changes most evident in the lower lumbar spine. Dense aortic vascular calcifications are noted. No evidence of an aneurysm. There are other  scattered vascular calcifications. Soft tissues otherwise unremarkable. IMPRESSION: 1. No fracture or acute finding. 2. Degenerative changes as detailed. Electronically Signed   By: Lajean Manes M.D.   On: 03/15/2017 08:26   Ct Head Wo  Contrast  Result Date: 03/15/2017 CLINICAL DATA:  Near syncope.  Left arm shaking. EXAM: CT HEAD WITHOUT CONTRAST TECHNIQUE: Contiguous axial images were obtained from the base of the skull through the vertex without intravenous contrast. COMPARISON:  02/27/2017 FINDINGS: Brain: Worsening of areas of low-density in the cerebellar hemispheres, more notable on the right, consistent with subacute cerebellar infarctions. No sign of hemorrhage or swelling. Chronic small-vessel ischemic changes affect the pons. Cerebral hemispheres show chronic small-vessel ischemic changes of the white matter. No cortical supratentorial infarction identified. No mass lesion, hemorrhage, hydrocephalus or extra-axial collection. Vascular: There is atherosclerotic calcification of the major vessels at the base of the brain. Skull: Negative Sinuses/Orbits: Clear/normal Other: None significant IMPRESSION: Worsening of areas of low-density in the cerebellum consistent with small acute/ subacute cerebellar infarctions. No sign of hemorrhage or mass effect. Electronically Signed   By: Nelson Chimes M.D.   On: 03/15/2017 08:11   Ct Head Wo Contrast  Result Date: 02/27/2017 CLINICAL DATA:  Syncope, congestion and flu like symptoms EXAM: CT HEAD WITHOUT CONTRAST TECHNIQUE: Contiguous axial images were obtained from the base of the skull through the vertex without intravenous contrast. COMPARISON:  None. FINDINGS: Brain: No acute territorial infarction, hemorrhage or intracranial mass is seen. Probable old small infarct in the left cerebellum. Mild periventricular small vessel ischemic changes of the white matter. Mild atrophy. Ventricles size within normal limits. Vascular: No hyperdense vessels. Carotid artery calcification. Vertebral artery calcification. Skull: No fracture or suspicious bone lesion. Sinuses/Orbits: Mucosal thickening in the ethmoid sinuses. No acute orbital abnormality. Other: Small scalp swelling right posterior vertex.  IMPRESSION: 1. No definite CT evidence for acute intracranial abnormality. 2. Suspect old small left cerebellar infarct. Mild small vessel ischemic changes of the white matter 3. Small right posterior scalp swelling Electronically Signed   By: Donavan Foil M.D.   On: 02/27/2017 19:37   Ct Angio Neck W Or Wo Contrast  Result Date: 03/17/2017 CLINICAL DATA:  Stroke follow-up. EXAM: CT ANGIOGRAPHY HEAD AND NECK TECHNIQUE: Multidetector CT imaging of the head and neck was performed using the standard protocol during bolus administration of intravenous contrast. Multiplanar CT image reconstructions and MIPs were obtained to evaluate the vascular anatomy. Carotid stenosis measurements (when applicable) are obtained utilizing NASCET criteria, using the distal internal carotid diameter as the denominator. CONTRAST:  50 cc Isovue 350 intravenous COMPARISON:  Brain MRI 03/15/2017 FINDINGS: CT HEAD FINDINGS Brain: Patchy acute to subacute infarcts in the bilateral cerebrum and parasagittal left occipital lobe as seen on previous brain MRI. No evidence of infarct progression. No hemorrhagic conversion. No hydrocephalus. Chronic microvascular ischemia in the cerebral white matter. Vascular: See below Skull: No acute or aggressive finding Sinuses: Negative Orbits: Negative Review of the MIP images confirms the above findings CTA NECK FINDINGS Aortic arch: Atherosclerosis without aneurysm or acute finding. Three vessel branching. Right carotid system: Atheromatous plaque at the common carotid origin without stenosis. There is prominent noncalcified plaque on the common carotid distally; stenosis measures 40-50%. Moderate calcified plaque at the common carotid bifurcation with stenosis measuring up to 60%. No superimposed ulceration. ICA tortuosity with vessel reaching the midline retropharynx. Left carotid system: Comparatively mild calcified and noncalcified plaque at the common carotid bifurcation and ICA bulb without  stenosis or ulceration.  Prominent ICA tortuosity reaching the midline retropharynx. Vertebral arteries: Left dominant vertebral artery that is widely patent to the dura. Thready flow in the diminutive right vertebral artery. Skeleton: No acute or aggressive finding. Other neck: No evidence of incidental mass or inflammation. Upper chest: Calcified granuloma in the left upper lobe. Review of the MIP images confirms the above findings CTA HEAD FINDINGS Anterior circulation: Calcified plaquing in the carotid siphons without flow limiting stenosis. Hypoplastic right A1 segment. No major branch occlusion is seen. There is atheromatous irregularity of bilateral MCA vessels without flow limiting and reversible stenosis. Posterior circulation: No visible flow within the hypoplastic distal right V4 segment. 2 left vertebral stenoses, mild proximally and moderate distally. At the anticipated level of proximal basilar artery there is severe short segment stenosis or occlusion. The mid basilar shows a more moderate narrowing. The right P1 segment is aplastic with fetal type circulation. There is atheromatous irregularity of the left more than right PCA with up to moderate left P2 segment stenoses Venous sinuses: Patent Anatomic variants: Fetal type right PCA Delayed phase: No abnormal intracranial enhancement. Review of the MIP images confirms the above findings IMPRESSION: 1. Advanced atherosclerosis in the head and neck. 2. Short segment atheromatous occlusion or critical stenosis in the proximal basilar. 3. Moderate atheromatous narrowing in the distal left V4 segment and mid basilar. The hypoplastic right vertebral artery that is effectively occluded at the dura. 4. 60-70% proximal right ICA and 40-50% right common carotid stenosis due to atherosclerotic plaque. 5. Known acute infarcts in the bilateral cerebellum and left occipital lobe (there is a fetal type right PCA). No hemorrhagic conversion or evidence of progression.  Electronically Signed   By: Monte Fantasia M.D.   On: 03/17/2017 14:25   Mr Brain Wo Contrast  Result Date: 03/15/2017 CLINICAL DATA:  76 year old female with with evidence of new cerebellar infarcts from earlier this month on head CT today performed for abnormal left upper extremity movements and near syncope. EXAM: MRI HEAD WITHOUT CONTRAST MRA HEAD WITHOUT CONTRAST TECHNIQUE: Multiplanar, multiecho pulse sequences of the brain and surrounding structures were obtained without intravenous contrast. Angiographic images of the head were obtained using MRA technique without contrast. COMPARISON:  Head CT 0805 hours today.  Head CT 02/27/2017. FINDINGS: MRI HEAD FINDINGS Brain: Patchy restricted diffusion in both cerebellar hemispheres (series 6, image 12). No brainstem involvement. No deep gray matter nuclei involvement, but there are also several scattered small foci of restricted diffusion in the left occipital lobe. Associated T2 and FLAIR hyperintensity. No associated hemorrhage or mass effect. No anterior circulation restricted diffusion. No midline shift, mass effect, evidence of mass lesion, ventriculomegaly, extra-axial collection or acute intracranial hemorrhage. Cervicomedullary junction and pituitary are within normal limits. Patchy bilateral cerebral white matter T2 and FLAIR hyperintensity. No chronic cortical encephalomalacia or chronic cerebral blood products are identified. Signal in the deep gray matter nuclei and brainstem is normal for age. Vascular: Major intracranial vascular flow voids are preserved, the distal left vertebral artery appears dominant and is somewhat dolichoectatic. There is mild generalized intracranial artery tortuosity. Skull and upper cervical spine: Negative. Sinuses/Orbits: Normal orbits soft tissues. Visualized paranasal sinuses and mastoids are stable and well pneumatized. Other: Visible internal auditory structures appear normal. Negative scalp soft tissues. MRA HEAD  FINDINGS Antegrade flow in the dominant distal left vertebral artery. No distal left vertebral artery stenosis. Absent flow in the non dominant appearing right vertebral artery V4 segment, although there is faint flow signal in the distal  right vertebral artery at the upper cervical spine. The basilar artery is patent, but there is severe proximal basilar stenosis, corresponding to an area of prominent calcified plaque on the CT head today (series 755, image 10). This is proximal to a dominant appearing right AICA origin. The distal basilar is irregular without additional stenosis. SCA and left PCA origins are normal. There is a fetal type right PCA origin. The left posterior communicating artery is diminutive or absent. There is mild irregularity and attenuated distal flow in the left PCA. The distal right PCA appears normal. Antegrade flow in both ICA siphons. Evidence of a tortuous retropharyngeal course of 1 or board carotid arteries in the neck. No ICA siphon stenosis. Normal ophthalmic artery origins. Patent carotid termini. Normal MCA and ACA origins. Dominant left A1 segment. Anterior communicating artery and visible ACA branches are within normal limits. Visible bilateral MCA branches are within normal limits. IMPRESSION: 1. 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 No associated hemorrhage or mass effect. 2. 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 3. 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 4. Attenuated flow in the distal left PCA. 5. Mild to moderate for age nonspecific cerebral white matter signal changes. 6. Negative anterior circulation. 7. Salient findings discussed by telephone with Dr. Jeanene Erb on 03/15/2017 at 20:33 . Electronically Signed   By: Genevie Ann M.D.   On: 03/15/2017 20:33   Mr Jodene Nam Head/brain LK Cm  Result Date: 03/15/2017 CLINICAL DATA:   76 year old female with with evidence of new cerebellar infarcts from earlier this month on head CT today performed for abnormal left upper extremity movements and near syncope. EXAM: MRI HEAD WITHOUT CONTRAST MRA HEAD WITHOUT CONTRAST TECHNIQUE: Multiplanar, multiecho pulse sequences of the brain and surrounding structures were obtained without intravenous contrast. Angiographic images of the head were obtained using MRA technique without contrast. COMPARISON:  Head CT 0805 hours today.  Head CT 02/27/2017. FINDINGS: MRI HEAD FINDINGS Brain: Patchy restricted diffusion in both cerebellar hemispheres (series 6, image 12). No brainstem involvement. No deep gray matter nuclei involvement, but there are also several scattered small foci of restricted diffusion in the left occipital lobe. Associated T2 and FLAIR hyperintensity. No associated hemorrhage or mass effect. No anterior circulation restricted diffusion. No midline shift, mass effect, evidence of mass lesion, ventriculomegaly, extra-axial collection or acute intracranial hemorrhage. Cervicomedullary junction and pituitary are within normal limits. Patchy bilateral cerebral white matter T2 and FLAIR hyperintensity. No chronic cortical encephalomalacia or chronic cerebral blood products are identified. Signal in the deep gray matter nuclei and brainstem is normal for age. Vascular: Major intracranial vascular flow voids are preserved, the distal left vertebral artery appears dominant and is somewhat dolichoectatic. There is mild generalized intracranial artery tortuosity. Skull and upper cervical spine: Negative. Sinuses/Orbits: Normal orbits soft tissues. Visualized paranasal sinuses and mastoids are stable and well pneumatized. Other: Visible internal auditory structures appear normal. Negative scalp soft tissues. MRA HEAD FINDINGS Antegrade flow in the dominant distal left vertebral artery. No distal left vertebral artery stenosis. Absent flow in the non  dominant appearing right vertebral artery V4 segment, although there is faint flow signal in the distal right vertebral artery at the upper cervical spine. The basilar artery is patent, but there is severe proximal basilar stenosis, corresponding to an area of prominent calcified plaque on the CT head today (series 755, image 10). This is  proximal to a dominant appearing right AICA origin. The distal basilar is irregular without additional stenosis. SCA and left PCA origins are normal. There is a fetal type right PCA origin. The left posterior communicating artery is diminutive or absent. There is mild irregularity and attenuated distal flow in the left PCA. The distal right PCA appears normal. Antegrade flow in both ICA siphons. Evidence of a tortuous retropharyngeal course of 1 or board carotid arteries in the neck. No ICA siphon stenosis. Normal ophthalmic artery origins. Patent carotid termini. Normal MCA and ACA origins. Dominant left A1 segment. Anterior communicating artery and visible ACA branches are within normal limits. Visible bilateral MCA branches are within normal limits. IMPRESSION: 1. 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 No associated hemorrhage or mass effect. 2. 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 3. 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 4. Attenuated flow in the distal left PCA. 5. Mild to moderate for age nonspecific cerebral white matter signal changes. 6. Negative anterior circulation. 7. Salient findings discussed by telephone with Dr. Jeanene Erb on 03/15/2017 at 20:33 . Electronically Signed   By: Genevie Ann M.D.   On: 03/15/2017 20:33    Time Spent in minutes  30   Jani Gravel M.D on 03/19/2017 at 9:52 AM  Between 7am to 7pm - Pager - [phone]  After 7pm go to [email or web address] - password [facility]  [facility] -  Office   [phone]

## 2017-03-19 NOTE — Progress Notes (Signed)
STROKE TEAM PROGRESS NOTE   SUBJECTIVE (INTERVAL HISTORY) No family is at the bedside.  No acute event overnight. Pt requested to speak with Pharmacy and attending physician Dr. Maudie Mercury to further reconcile home medications.  Request relayed to Dr. Maudie Mercury and the patient's nurse.  Dr. Estanislado Pandy discussed cerebral angiogram procedure with patient, and has placed her on procedure schedule for tomorrow.   OBJECTIVE Temp:  [97.4 F (36.3 C)-97.7 F (36.5 C)] 97.4 F (36.3 C) (07/04 0615) Pulse Rate:  [54-64] 54 (07/04 0615) Cardiac Rhythm: Sinus bradycardia (07/04 0700) Resp:  [16-18] 18 (07/04 0615) BP: (117-132)/(51-64) 132/51 (07/04 0615) SpO2:  [97 %-98 %] 97 % (07/04 0615)  CBC:   Recent Labs Lab 03/15/17 0753 03/17/17 0630  WBC 8.4 7.3  NEUTROABS 4.5  --   HGB 14.9 13.5  HCT 42.6 40.9  MCV 92.2 93.2  PLT 261 010    Basic Metabolic Panel:   Recent Labs Lab 03/15/17 0753 03/17/17 0630  NA 137 136  K 4.1 4.2  CL 101 104  CO2 28 24  GLUCOSE 116* 98  BUN 14 16  CREATININE 0.79 0.92  CALCIUM 9.5 8.6*  MG 1.7  --     Lipid Panel:     Component Value Date/Time   CHOL 272 (H) 03/16/2017 0717   TRIG 783 (H) 03/16/2017 0717   TRIG 239 03/07/2009   HDL 30 (L) 03/16/2017 0717   CHOLHDL 9.1 03/16/2017 0717   VLDL UNABLE TO CALCULATE IF TRIGLYCERIDE OVER 400 mg/dL 03/16/2017 0717   LDLCALC UNABLE TO CALCULATE IF TRIGLYCERIDE OVER 400 mg/dL 03/16/2017 0717   HgbA1c:  Lab Results  Component Value Date   HGBA1C 5.8 (H) 03/16/2017   Urine Drug Screen:     Component Value Date/Time   LABOPIA NONE DETECTED 03/15/2017 0825   COCAINSCRNUR NONE DETECTED 03/15/2017 0825   LABBENZ NONE DETECTED 03/15/2017 0825   AMPHETMU NONE DETECTED 03/15/2017 0825   THCU NONE DETECTED 03/15/2017 0825   LABBARB NONE DETECTED 03/15/2017 0825    Alcohol Level No results found for: [place] I have personally reviewed the radiological images below and agree with the radiology  interpretations.  Ct Head Wo Contrast 03/15/2017 Worsening of areas of low-density in the cerebellum consistent with small acute/ subacute cerebellar infarctions. No sign of hemorrhage or mass effect.    Mr Jodene Nam Head/brain Wo Cm 03/15/2017 1. 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 No associated hemorrhage or mass effect.  2. 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  3. 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  4. Attenuated flow in the distal left PCA.  5. Mild to moderate for age nonspecific cerebral white matter signal changes.  6. Negative anterior circulation.   CTA head and neck  1. Advanced atherosclerosis in the head and neck. 2. Short segment atheromatous occlusion or critical stenosis in the proximal basilar. 3. Moderate atheromatous narrowing in the distal left V4 segment and mid basilar. The hypoplastic right vertebral artery that is effectively occluded at the dura. 4. 60-70% proximal right ICA and 40-50% right common carotid stenosis due to atherosclerotic plaque. 5. Known acute infarcts in the bilateral cerebellum and left occipital lobe (there is a fetal type right PCA). No hemorrhagic conversion or evidence of progression.  DSA pending  TTE - Left ventricle: The cavity size was normal. Wall thickness was   increased in a  pattern of moderate LVH. Systolic function was   normal. The estimated ejection fraction was in the range of 60%   to 65%. Wall motion was normal; there were no regional wall   motion abnormalities. Doppler parameters are consistent with   abnormal left ventricular relaxation (grade 1 diastolic   dysfunction). - Aortic valve: There was trivial regurgitation. - Pericardium, extracardiac: A trivial pericardial effusion was   identified.  EEG - A normal EEG does not exclude a clinical diagnosis of epilepsy.  If  further clinical questions remain, prolonged EEG may be helpful.  Clinical correlation is advised.   PHYSICAL EXAM  Temp:  [97.4 F (36.3 C)-97.7 F (36.5 C)] 97.4 F (36.3 C) (07/04 0615) Pulse Rate:  [54-64] 54 (07/04 0615) Resp:  [16-18] 18 (07/04 0615) BP: (117-132)/(51-64) 132/51 (07/04 0615) SpO2:  [97 %-98 %] 97 % (07/04 0615)  General - Well nourished, well developed, in no apparent distress.  Ophthalmologic - Fundi not visualized due to noncooperation.  Cardiovascular - Regular rate and rhythm.  Mental Status -  Level of arousal and orientation to time, place, and person were intact. Language including expression, naming, repetition, comprehension was assessed and found intact. Fund of Knowledge was assessed and was intact.  Cranial Nerves II - XII - II - Visual field intact OU. III, IV, VI - Extraocular movements intact. V - Facial sensation intact bilaterally. VII - Facial movement intact bilaterally. VIII - Hearing & vestibular intact bilaterally. X - Palate elevates symmetrically. XI - Chin turning & shoulder shrug intact bilaterally. XII - Tongue protrusion intact.  Motor Strength - The patient's strength was normal in all extremities and pronator drift was absent.  Bulk was normal and fasciculations were absent.   Motor Tone - Muscle tone was assessed at the neck and appendages and was normal.  Reflexes - The patient's reflexes were 1+ in all extremities and she had no pathological reflexes.  Sensory - Light touch, temperature/pinprick were assessed and were symmetrical.    Coordination - The patient had normal movements in the hands and feet with no ataxia or dysmetria.  Essential tremor was present b/l, more on the right than left.  Gait and Station - deferred   ASSESSMENT/PLAN [name] is a 76 y.o. female with history of hypertension, asthma, known lung nodule, and thyroid disease presenting with uncontrollable movements of the left upper  extremity. She did not receive IV t-PA due to transient deficits.  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  Resultant  No deficit  CT head - small acute/ subacute cerebellar infarctions.  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  CTA H&N - proximal BA high-grade stenosis, mid BA atherosclerosis with post stenotic dilatation, left VA atherosclerosis, right VA hypoplastic with distal occlusion, proximal right ICA 60-70% stenosis and proximal left ICA 40-50% stenosis.  Cerebral angio - pending  EEG - normal  2D Echo - unremarkable  LDL - unable to calculate - triglycerides 783  HgbA1c 5.8  VTE prophylaxis - Lovenox Diet NPO time specified Except for: Sips with Meds Diet NPO time specified Except for: Sips with Meds  No antithrombotic prior to admission, now on aspirin 325 mg daily and clopidogrel 75 mg daily.  Patient counseled to be compliant with  her antithrombotic medications  Ongoing aggressive stroke risk factor management  Therapy recommendations: pending  Disposition:  Pending  Posterior circulation extensive athero  MRA showed BA severe stenosis, right VA occlusion and left PCA decreased flow  CTA head and neck proximal BA high-grade stenosis, mid BA atherosclerosis with post stenotic dilatation, left VA atherosclerosis, right VA hypoplastic with distal occlusion  DSA pending  On DAPT with plavix load  Carotid stenosis  CTA head and neck - proximal right ICA 60-70% stenosis and proximal left ICA 40-50% stenosis.  DSA pending  On DAPT  Hypertension  Stable  On home meds - low dose metoprolol and losartan  BP goal 130-160 due to intracranial stenosis  Hyperlipidemia  Home meds:  No lipid lowering medications prior to  admission  LDL could not be calculated secondary to elevated triglycerides, goal < 70  Now on crestor 40 and zetia 10  Continue at discharge  Other Stroke Risk Factors  Advanced age  Former cigarette smoker - quit 45 years ago.  Obesity, Body mass index is 35.9 kg/m., recommend weight loss, diet and exercise as appropriate   Depo Estradiol PTA  Other Active Problems    Hospital day # 3  Rosalin Hawking, MD PhD Stroke Neurology 03/19/2017 11:29 AM   To contact Stroke Continuity provider, please refer to [email or web address] After hours, contact General Neurology

## 2017-03-19 NOTE — Progress Notes (Signed)
OT Cancellation Note  Patient Details Name: Kathleen Dunn MRN: [id number] DOB: 18-Mar-1941   Cancelled Treatment:    Reason Eval/Treat Not Completed: Patient declined, no reason specified. Pt politely declined participation in OT treatment this afternoon despite encouragement and education concerning benefits of participation in ADL to maximize recovery and to ensure safety in home environment.  [place] 03/19/2017, 4:31 PM  Hulda Humphrey OTR/L [phone]

## 2017-03-19 NOTE — Care Management Important Message (Signed)
Important Message  Patient Details  Name: Kathleen Dunn MRN: [id number] Date of Birth: October 01, 1940   Medicare Important Message Given:  Yes    Nathen May 03/19/2017, 9:44 AM

## 2017-03-19 NOTE — Progress Notes (Signed)
  Speech Language Pathology Treatment: Cognitive-Linquistic  Patient Details Name: Kathleen Dunn MRN: [id number] DOB: 29-Aug-1941 Today's Date: 03/19/2017 Time: 1110-1140 SLP Time Calculation (min) (ACUTE ONLY): 30 min  Assessment / Plan / Recommendation Clinical Impression  Pt seen for diagnostic treatment of cognitive-linguistic skills. Pt reported feeling more alert today compared to previous SLP visit. Pt continues to be tangential in conversation; no word finding difficulties noted and speech is 100% intelligible. Pt endorsed having occasional short-term memory difficulties PTA; feels it depends on her level of interest/ level of focus. Reviewed general compensatory strategies such as utilizing a calendar, taking notes for reminders. Pt also feels her anxiety impacts her performance on tasks such as the cognitive-linguistic evaluation. Pt is at baseline level of functioning; Dunn sign off at this time. Provided education to pt that if short-term memory difficulties persist or worsen, outpatient speech therapy services could be beneficial in the future.   HPI HPI: Milinda Saundersis a 76 y.o.femalewith a history of asthma, hypertension, thyroid disease, anxiety. Per chart patient was seen at [facility] for near syncope on June 14 and received a head CT which showed no acute intracranial abnormality but evidence of an old small left cerebellar infarct with small vessel ischemic changes in the white matter. Admitted with intermittent shaking and "twisting" of her left hand which last for several minutes. CT scan today showed worsening areas of low density in the cerebellum consistent with small acute/subacute cerebellar infarctions.      SLP Plan  All goals met       Recommendations                   Follow up Recommendations: None SLP Visit Diagnosis: Cognitive communication deficit (S85.462) Plan: All goals met       [place], [place], CCC-SLP 03/19/2017,  11:47 AM [id number]

## 2017-03-20 ENCOUNTER — Inpatient Hospital Stay (HOSPITAL_COMMUNITY): Payer: Medicare Other

## 2017-03-20 DIAGNOSIS — I6300 Cerebral infarction due to thrombosis of unspecified precerebral artery: Secondary | ICD-10-CM

## 2017-03-20 DIAGNOSIS — I651 Occlusion and stenosis of basilar artery: Secondary | ICD-10-CM

## 2017-03-20 HISTORY — PX: IR ANGIO INTRA EXTRACRAN SEL COM CAROTID INNOMINATE BILAT MOD SED: IMG5360

## 2017-03-20 HISTORY — PX: IR ANGIO VERTEBRAL SEL SUBCLAVIAN INNOMINATE UNI R MOD SED: IMG5365

## 2017-03-20 HISTORY — PX: IR ANGIO VERTEBRAL SEL VERTEBRAL UNI L MOD SED: IMG5367

## 2017-03-20 LAB — COMPREHENSIVE METABOLIC PANEL
ALT: 17 U/L (ref 14–54)
ANION GAP: 6 (ref 5–15)
AST: 19 U/L (ref 15–41)
Albumin: 3.1 g/dL — ABNORMAL LOW (ref 3.5–5.0)
Alkaline Phosphatase: 35 U/L — ABNORMAL LOW (ref 38–126)
BILIRUBIN TOTAL: 0.6 mg/dL (ref 0.3–1.2)
BUN: 7 mg/dL (ref 6–20)
CO2: 24 mmol/L (ref 22–32)
Calcium: 8.1 mg/dL — ABNORMAL LOW (ref 8.9–10.3)
Chloride: 109 mmol/L (ref 101–111)
Creatinine, Ser: 0.87 mg/dL (ref 0.44–1.00)
Glucose, Bld: 92 mg/dL (ref 65–99)
Potassium: 3.8 mmol/L (ref 3.5–5.1)
Sodium: 139 mmol/L (ref 135–145)
TOTAL PROTEIN: 5.4 g/dL — AB (ref 6.5–8.1)

## 2017-03-20 LAB — CBC
HEMATOCRIT: 36.6 % (ref 36.0–46.0)
HEMOGLOBIN: 12.3 g/dL (ref 12.0–15.0)
MCH: 31.6 pg (ref 26.0–34.0)
MCHC: 33.6 g/dL (ref 30.0–36.0)
MCV: 94.1 fL (ref 78.0–100.0)
Platelets: 219 10*3/uL (ref 150–400)
RBC: 3.89 MIL/uL (ref 3.87–5.11)
RDW: 13.4 % (ref 11.5–15.5)
WBC: 6.1 10*3/uL (ref 4.0–10.5)

## 2017-03-20 LAB — PROTIME-INR
INR: 1.03
Prothrombin Time: 13.5 seconds (ref 11.4–15.2)

## 2017-03-20 MED ORDER — IOPAMIDOL (ISOVUE-300) INJECTION 61%
INTRAVENOUS | Status: AC
  Administered 2017-03-20: 96 mL
  Filled 2017-03-20: qty 150

## 2017-03-20 MED ORDER — HEPARIN SODIUM (PORCINE) 1000 UNIT/ML IJ SOLN
INTRAMUSCULAR | Status: AC
  Filled 2017-03-20: qty 1

## 2017-03-20 MED ORDER — MIDAZOLAM HCL 2 MG/2ML IJ SOLN
INTRAMUSCULAR | Status: AC
  Filled 2017-03-20: qty 2

## 2017-03-20 MED ORDER — FENTANYL CITRATE (PF) 100 MCG/2ML IJ SOLN
INTRAMUSCULAR | Status: AC
  Filled 2017-03-20: qty 2

## 2017-03-20 MED ORDER — LOSARTAN POTASSIUM 50 MG PO TABS
50.0000 mg | ORAL_TABLET | Freq: Every day | ORAL | Status: DC
  Administered 2017-03-20 – 2017-04-01 (×12): 50 mg via ORAL
  Filled 2017-03-20 (×12): qty 1

## 2017-03-20 MED ORDER — DIPHENHYDRAMINE HCL 50 MG/ML IJ SOLN
INTRAMUSCULAR | Status: AC
  Administered 2017-03-20: 50 mg via INTRAVENOUS
  Filled 2017-03-20: qty 1

## 2017-03-20 MED ORDER — IOPAMIDOL (ISOVUE-300) INJECTION 61%
INTRAVENOUS | Status: AC
  Filled 2017-03-20: qty 50

## 2017-03-20 MED ORDER — LIDOCAINE HCL 1 % IJ SOLN
INTRAMUSCULAR | Status: AC | PRN
  Administered 2017-03-20: 10 mL

## 2017-03-20 MED ORDER — LIDOCAINE HCL (PF) 1 % IJ SOLN
INTRAMUSCULAR | Status: AC
  Filled 2017-03-20: qty 30

## 2017-03-20 MED ORDER — FENTANYL CITRATE (PF) 100 MCG/2ML IJ SOLN
INTRAMUSCULAR | Status: AC | PRN
  Administered 2017-03-20: 50 ug via INTRAVENOUS

## 2017-03-20 MED ORDER — CLONAZEPAM 1 MG PO TABS
1.0000 mg | ORAL_TABLET | Freq: Three times a day (TID) | ORAL | Status: DC | PRN
Start: 2017-03-20 — End: 2017-04-01
  Administered 2017-03-21 – 2017-04-01 (×15): 1 mg via ORAL
  Filled 2017-03-20 (×16): qty 1

## 2017-03-20 MED ORDER — CLONAZEPAM 1 MG PO TABS
1.0000 mg | ORAL_TABLET | Freq: Once | ORAL | Status: AC
  Administered 2017-03-20: 1 mg via ORAL
  Filled 2017-03-20: qty 1

## 2017-03-20 MED ORDER — CLONAZEPAM 1 MG PO TABS
1.0000 mg | ORAL_TABLET | Freq: Every day | ORAL | Status: DC
  Administered 2017-03-20 – 2017-03-31 (×13): 1 mg via ORAL
  Filled 2017-03-20 (×15): qty 1

## 2017-03-20 MED ORDER — SODIUM CHLORIDE 0.9 % IV SOLN
INTRAVENOUS | Status: AC
  Administered 2017-03-20: 12:00:00 via INTRAVENOUS

## 2017-03-20 MED ORDER — MIDAZOLAM HCL 2 MG/2ML IJ SOLN
INTRAMUSCULAR | Status: AC | PRN
  Administered 2017-03-20: 1 mg via INTRAVENOUS

## 2017-03-20 MED ORDER — DIPHENHYDRAMINE HCL 50 MG/ML IJ SOLN
50.0000 mg | Freq: Once | INTRAMUSCULAR | Status: AC
  Administered 2017-03-20: 50 mg via INTRAVENOUS

## 2017-03-20 MED ORDER — CLONAZEPAM 0.5 MG PO TABS
0.5000 mg | ORAL_TABLET | Freq: Every day | ORAL | Status: AC | PRN
  Administered 2017-03-20: 0.5 mg via ORAL
  Filled 2017-03-20: qty 1

## 2017-03-20 MED ORDER — HEPARIN SODIUM (PORCINE) 1000 UNIT/ML IJ SOLN
INTRAMUSCULAR | Status: AC | PRN
  Administered 2017-03-20: 1000 [IU] via INTRAVENOUS

## 2017-03-20 NOTE — Sedation Documentation (Signed)
Patient is resting comfortably. 

## 2017-03-20 NOTE — Progress Notes (Signed)
   03/20/17 1350  Clinical Encounter Type  Visited With Patient and family together  Visit Type Initial  Spiritual Encounters  Spiritual Needs Emotional  Stress Factors  Patient Stress Factors Health changes  Family Stress Factors None identified  Introduction to Pt and family. Request from nursing of visiting Pt daily. Discussed following up with Pt per request.

## 2017-03-20 NOTE — Progress Notes (Signed)
Patient ID: Kathleen Dunn, female   DOB: June 24, 1941, 76 y.o.   MRN: [id number]                                                                PROGRESS NOTE                                                                                                                                                                                                             Patient Demographics:    Kathleen Dunn, is a 76 y.o. female, DOB - 04/15/1941, SNK:[id number]  Admit date - 03/15/2017   Admitting Physician Kathleen Mainland, DO  Outpatient Primary MD for the patient is Kathleen Marry, MD  LOS - 4  Outpatient Specialists:    Chief Complaint  Patient presents with  . Near Syncope       Brief Narrative     HPI on 03/15/2017 by Dr. Loma Dunn Kathleen Saundersis a 76 y.o.femalewith a history of asthma, hypertension, thyroid disease, anxiety, on Desogen therapy for hormone replacement. Patient was seen at [facility] for near syncope on June 14 and received a head CT which showed no acute intracranial abnormality but evidence of an old small left cerebellar infarct with small vessel ischemic changes in the white matter. Over the past 2 days the patient has had intermittent shaking and "twisting" of her left hand which last for several minutes. She has been feeling lightheaded over the past few days and took Dramamine twice, which preceded the episodes of shaking and twisting. She denies vertigo, chest pain, weakness, sensory changes. Her CT scan today showed worsening areas of low density in the cerebellum consistent with small acute/subacute cerebellar infarctions.  Interim history  Found to have Acute CVA on MRI. Neuro consulted. Plan for cerebral angiogram.   Subjective:    Kathleen Dunn today rested well with clonazepam,  Doesn't feel as anxious.  Awaiting angiogram today.  , No headache, No chest pain, No abdominal pain - No Nausea, No new weakness tingling or numbness, No Cough - SOB.      Assessment  & Plan :    Principal Problem:   Cerebellar stroke (HCC) Active Problems:   Hypothyroidism   HYPERCHOLESTEROLEMIA   HYPERTENSION, BENIGN SYSTEMIC   GAD (generalized anxiety disorder)   Mixed hyperlipidemia  Basilar artery stenosis   Bilateral carotid artery stenosis   Acute CVA/Cerebellar stroke -Presented with near syncope, complains of visual changes -Patient currently having word/phrase repetition -CT head: Worsening of areas of low density in the cerebellum consistent with small acute/subacute cerebellar infarctions -MRI brain: Scattered acute infarcts in both cerebellar hemispheres and left PCA territory -MRA head: Age-indeterminate occlusion of the nondominant distal right vertebral artery, severe proximal basilar artery stenosis -Echocardiogram: EF 75-64%, grade 1 diastolic dysfunction. No RWMA -Hemoglobin A1c 5.8 -LDL unable to calculate -PT and speech consulted and patient has no further needs -pending speech and OT consults (patient does live alone) -Continue aspirin, statin, plavix -Neurology consulted and appreciated -Interventional radiology consulted for cerebral angiogram- pending.  -Patient is hesitant and wanting more information before having additional testing down. Explained to patient that she should discuss this with her family and neurology.  -EEG: normal  Cerebral angiogram 7/5 NPO afeter [place]  Essential hypertension -Continue metoprolol, will hold triamterene/HCTZ  Hyperlipidemia -Lipid panel: Total cholesterol 272, HDL 30, LDL unable to calculate, triglycerides 783 -Currently on statin, however switched to Crestor 40mg  daily and Zetia added Add vascepa (not on formulary, will add at discharge)  Hypothyroidism -Continue Synthroid  Anxiety -d/c lorazepam. Continue Klonopin as needed  Knee pain voltaren gel 2gm topically qid prn   DVT ProphylaxisLovenox  Code Status:Full  Family Communication:None at  bedside  Disposition Plan:Admitted. Pending further workup and evaluations. Dispo TBD  Consultants Neurology Interventional radiology   Procedures  Echocardiogram 7/1=>EF 60-65%, trivial pericardial effusion, grade 1 diastolic dysfunction MRI brain 6/30=>  IMPRESSION: 1. 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 No associated hemorrhage or mass effect. 2. 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 3. 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 4. Attenuated flow in the distal left PCA. 5. Mild to moderate for age nonspecific cerebral white matter signal changes. 6. Negative anterior circulation.  CTA head/neck 7/2=> IMPRESSION: 1. Advanced atherosclerosis in the head and neck. 2. Short segment atheromatous occlusion or critical stenosis in the proximal basilar. 3. Moderate atheromatous narrowing in the distal left V4 segment and mid basilar. The hypoplastic right vertebral artery that is effectively occluded at the dura. 4. 60-70% proximal right ICA and 40-50% right common carotid stenosis due to atherosclerotic plaque. 5. Known acute infarcts in the bilateral cerebellum and left occipital lobe (there is a fetal type right PCA). No hemorrhagic conversion or evidence of progression.  Antibiotics    Anti-infectives   None     Lab Results  Component Value Date   PLT 232 03/17/2017      Anti-infectives    None        Objective:   Vitals:   03/19/17 1710 03/19/17 1852 03/19/17 2105 03/20/17 0123  BP: (!) 165/57  (!) 150/90 137/68  Pulse: 67  65 66  Resp: 17  16 16   Temp:  98.3 F (36.8 C) 97.8 F (36.6 C) 98 F (36.7 C)  TempSrc:  Oral Oral Oral  SpO2: 96%  97% 99%  Weight:      Height:        Wt Readings from Last 3 Encounters:  03/15/17 86.2 kg (190 lb)  02/27/17 86.2 kg (190 lb)  11/03/15 82.6  kg (182 lb)     Intake/Output Summary (Last 24 hours) at 03/20/17 3329 Last data filed at 03/20/17 0400  Gross per 24 hour  Intake          3276.25 ml  Output                0 ml  Net          3276.25 ml     Physical Exam  Awake Alert, Oriented X 3, No new F.N deficits, Normal affect [place].AT,PERRAL Supple Neck,No JVD, No cervical lymphadenopathy appriciated.  Symmetrical Chest wall movement, Good air movement bilaterally, CTAB RRR,No Gallops,Rubs or new Murmurs, No Parasternal Heave +ve B.Sounds, Abd Soft, No tenderness, No organomegaly appriciated, No rebound - guarding or rigidity. No Cyanosis, Clubbing or edema, No new Rash or bruise   Good finger to nose.     Data Review:    CBC  Recent Labs Lab 03/15/17 0753 03/17/17 0630  WBC 8.4 7.3  HGB 14.9 13.5  HCT 42.6 40.9  PLT 261 232  MCV 92.2 93.2  MCH 32.3 30.8  MCHC 35.0 33.0  RDW 13.2 13.3  LYMPHSABS 2.6  --   MONOABS 0.9  --   EOSABS 0.4  --   BASOSABS 0.0  --     Chemistries   Recent Labs Lab 03/15/17 0753 03/17/17 0630  NA 137 136  K 4.1 4.2  CL 101 104  CO2 28 24  GLUCOSE 116* 98  BUN 14 16  CREATININE 0.79 0.92  CALCIUM 9.5 8.6*  MG 1.7  --   AST 21  --   ALT 18  --   ALKPHOS 43  --   BILITOT 0.4  --    ------------------------------------------------------------------------------------------------------------------ No results for input(s): CHOL, HDL, LDLCALC, TRIG, CHOLHDL, LDLDIRECT in the last 72 hours.  Lab Results  Component Value Date   HGBA1C 5.8 (H) 03/16/2017   ------------------------------------------------------------------------------------------------------------------ No results for input(s): TSH, T4TOTAL, T3FREE, THYROIDAB in the last 72 hours.  Invalid input(s): FREET3 ------------------------------------------------------------------------------------------------------------------ No results for input(s): VITAMINB12, FOLATE, FERRITIN, TIBC, IRON, RETICCTPCT in the  last 72 hours.  Coagulation profile  Recent Labs Lab 03/17/17 0630  INR 1.09    No results for input(s): DDIMER in the last 72 hours.  Cardiac Enzymes No results for input(s): CKMB, TROPONINI, MYOGLOBIN in the last 168 hours.  Invalid input(s): CK ------------------------------------------------------------------------------------------------------------------ No results found for: BNP  Inpatient Medications  Scheduled Meds: .  stroke: mapping our early stages of recovery book   Does not apply Once  . aspirin EC  325 mg Oral Daily  . budesonide (PULMICORT) nebulizer solution  0.25 mg Nebulization BID  . clopidogrel  75 mg Oral Daily  . enoxaparin (LOVENOX) injection  40 mg Subcutaneous Q24H  . ezetimibe  10 mg Oral Daily  . famotidine  20 mg Oral BID  . levothyroxine  50 mcg Oral QAC breakfast  . losartan  25 mg Oral Daily  . rosuvastatin  40 mg Oral q1800   Continuous Infusions: . sodium chloride 50 mL/hr at 03/19/17 0326   PRN Meds:.acetaminophen **OR** acetaminophen (TYLENOL) oral liquid 160 mg/5 mL **OR** acetaminophen, acetic acid-hydrocortisone, clonazePAM, clonazePAM, diclofenac sodium, ondansetron (ZOFRAN) IV, senna-docusate  Micro Results No results found for this or any previous visit (from the past 240 hour(s)).  Radiology Reports Ct Angio Head W Or Wo Contrast  Result Date: 03/17/2017 CLINICAL DATA:  Stroke follow-up. EXAM: CT ANGIOGRAPHY HEAD AND NECK TECHNIQUE: Multidetector CT imaging of the head and neck was performed using the standard protocol during bolus administration of intravenous contrast. Multiplanar CT image reconstructions and MIPs were obtained to evaluate the vascular anatomy. Carotid  stenosis measurements (when applicable) are obtained utilizing NASCET criteria, using the distal internal carotid diameter as the denominator. CONTRAST:  50 cc Isovue 350 intravenous COMPARISON:  Brain MRI 03/15/2017 FINDINGS: CT HEAD FINDINGS Brain: Patchy acute  to subacute infarcts in the bilateral cerebrum and parasagittal left occipital lobe as seen on previous brain MRI. No evidence of infarct progression. No hemorrhagic conversion. No hydrocephalus. Chronic microvascular ischemia in the cerebral white matter. Vascular: See below Skull: No acute or aggressive finding Sinuses: Negative Orbits: Negative Review of the MIP images confirms the above findings CTA NECK FINDINGS Aortic arch: Atherosclerosis without aneurysm or acute finding. Three vessel branching. Right carotid system: Atheromatous plaque at the common carotid origin without stenosis. There is prominent noncalcified plaque on the common carotid distally; stenosis measures 40-50%. Moderate calcified plaque at the common carotid bifurcation with stenosis measuring up to 60%. No superimposed ulceration. ICA tortuosity with vessel reaching the midline retropharynx. Left carotid system: Comparatively mild calcified and noncalcified plaque at the common carotid bifurcation and ICA bulb without stenosis or ulceration. Prominent ICA tortuosity reaching the midline retropharynx. Vertebral arteries: Left dominant vertebral artery that is widely patent to the dura. Thready flow in the diminutive right vertebral artery. Skeleton: No acute or aggressive finding. Other neck: No evidence of incidental mass or inflammation. Upper chest: Calcified granuloma in the left upper lobe. Review of the MIP images confirms the above findings CTA HEAD FINDINGS Anterior circulation: Calcified plaquing in the carotid siphons without flow limiting stenosis. Hypoplastic right A1 segment. No major branch occlusion is seen. There is atheromatous irregularity of bilateral MCA vessels without flow limiting and reversible stenosis. Posterior circulation: No visible flow within the hypoplastic distal right V4 segment. 2 left vertebral stenoses, mild proximally and moderate distally. At the anticipated level of proximal basilar artery there is  severe short segment stenosis or occlusion. The mid basilar shows a more moderate narrowing. The right P1 segment is aplastic with fetal type circulation. There is atheromatous irregularity of the left more than right PCA with up to moderate left P2 segment stenoses Venous sinuses: Patent Anatomic variants: Fetal type right PCA Delayed phase: No abnormal intracranial enhancement. Review of the MIP images confirms the above findings IMPRESSION: 1. Advanced atherosclerosis in the head and neck. 2. Short segment atheromatous occlusion or critical stenosis in the proximal basilar. 3. Moderate atheromatous narrowing in the distal left V4 segment and mid basilar. The hypoplastic right vertebral artery that is effectively occluded at the dura. 4. 60-70% proximal right ICA and 40-50% right common carotid stenosis due to atherosclerotic plaque. 5. Known acute infarcts in the bilateral cerebellum and left occipital lobe (there is a fetal type right PCA). No hemorrhagic conversion or evidence of progression. Electronically Signed   By: Monte Fantasia M.D.   On: 03/17/2017 14:25   Dg Lumbar Spine Complete  Result Date: 03/15/2017 CLINICAL DATA:  Pt complains of lower back pain x 5 days with fall, near syncopal episode and dizziness x 2 days EXAM: LUMBAR SPINE - COMPLETE 4+ VIEW COMPARISON:  None. FINDINGS: No fracture.  No spondylolisthesis. Moderate to marked loss of disc height at L1-L2 with endplate sclerosis and osteophytes. Mild loss disc height at T12-L1 and L3-L4. There facet degenerative changes most evident in the lower lumbar spine. Dense aortic vascular calcifications are noted. No evidence of an aneurysm. There are other scattered vascular calcifications. Soft tissues otherwise unremarkable. IMPRESSION: 1. No fracture or acute finding. 2. Degenerative changes as detailed. Electronically Signed   By:  Lajean Manes M.D.   On: 03/15/2017 08:26   Ct Head Wo Contrast  Result Date: 03/15/2017 CLINICAL DATA:  Near  syncope.  Left arm shaking. EXAM: CT HEAD WITHOUT CONTRAST TECHNIQUE: Contiguous axial images were obtained from the base of the skull through the vertex without intravenous contrast. COMPARISON:  02/27/2017 FINDINGS: Brain: Worsening of areas of low-density in the cerebellar hemispheres, more notable on the right, consistent with subacute cerebellar infarctions. No sign of hemorrhage or swelling. Chronic small-vessel ischemic changes affect the pons. Cerebral hemispheres show chronic small-vessel ischemic changes of the white matter. No cortical supratentorial infarction identified. No mass lesion, hemorrhage, hydrocephalus or extra-axial collection. Vascular: There is atherosclerotic calcification of the major vessels at the base of the brain. Skull: Negative Sinuses/Orbits: Clear/normal Other: None significant IMPRESSION: Worsening of areas of low-density in the cerebellum consistent with small acute/ subacute cerebellar infarctions. No sign of hemorrhage or mass effect. Electronically Signed   By: Nelson Chimes M.D.   On: 03/15/2017 08:11   Ct Head Wo Contrast  Result Date: 02/27/2017 CLINICAL DATA:  Syncope, congestion and flu like symptoms EXAM: CT HEAD WITHOUT CONTRAST TECHNIQUE: Contiguous axial images were obtained from the base of the skull through the vertex without intravenous contrast. COMPARISON:  None. FINDINGS: Brain: No acute territorial infarction, hemorrhage or intracranial mass is seen. Probable old small infarct in the left cerebellum. Mild periventricular small vessel ischemic changes of the white matter. Mild atrophy. Ventricles size within normal limits. Vascular: No hyperdense vessels. Carotid artery calcification. Vertebral artery calcification. Skull: No fracture or suspicious bone lesion. Sinuses/Orbits: Mucosal thickening in the ethmoid sinuses. No acute orbital abnormality. Other: Small scalp swelling right posterior vertex. IMPRESSION: 1. No definite CT evidence for acute  intracranial abnormality. 2. Suspect old small left cerebellar infarct. Mild small vessel ischemic changes of the white matter 3. Small right posterior scalp swelling Electronically Signed   By: Donavan Foil M.D.   On: 02/27/2017 19:37   Ct Angio Neck W Or Wo Contrast  Result Date: 03/17/2017 CLINICAL DATA:  Stroke follow-up. EXAM: CT ANGIOGRAPHY HEAD AND NECK TECHNIQUE: Multidetector CT imaging of the head and neck was performed using the standard protocol during bolus administration of intravenous contrast. Multiplanar CT image reconstructions and MIPs were obtained to evaluate the vascular anatomy. Carotid stenosis measurements (when applicable) are obtained utilizing NASCET criteria, using the distal internal carotid diameter as the denominator. CONTRAST:  50 cc Isovue 350 intravenous COMPARISON:  Brain MRI 03/15/2017 FINDINGS: CT HEAD FINDINGS Brain: Patchy acute to subacute infarcts in the bilateral cerebrum and parasagittal left occipital lobe as seen on previous brain MRI. No evidence of infarct progression. No hemorrhagic conversion. No hydrocephalus. Chronic microvascular ischemia in the cerebral white matter. Vascular: See below Skull: No acute or aggressive finding Sinuses: Negative Orbits: Negative Review of the MIP images confirms the above findings CTA NECK FINDINGS Aortic arch: Atherosclerosis without aneurysm or acute finding. Three vessel branching. Right carotid system: Atheromatous plaque at the common carotid origin without stenosis. There is prominent noncalcified plaque on the common carotid distally; stenosis measures 40-50%. Moderate calcified plaque at the common carotid bifurcation with stenosis measuring up to 60%. No superimposed ulceration. ICA tortuosity with vessel reaching the midline retropharynx. Left carotid system: Comparatively mild calcified and noncalcified plaque at the common carotid bifurcation and ICA bulb without stenosis or ulceration. Prominent ICA tortuosity  reaching the midline retropharynx. Vertebral arteries: Left dominant vertebral artery that is widely patent to the dura. Thready flow in the  diminutive right vertebral artery. Skeleton: No acute or aggressive finding. Other neck: No evidence of incidental mass or inflammation. Upper chest: Calcified granuloma in the left upper lobe. Review of the MIP images confirms the above findings CTA HEAD FINDINGS Anterior circulation: Calcified plaquing in the carotid siphons without flow limiting stenosis. Hypoplastic right A1 segment. No major branch occlusion is seen. There is atheromatous irregularity of bilateral MCA vessels without flow limiting and reversible stenosis. Posterior circulation: No visible flow within the hypoplastic distal right V4 segment. 2 left vertebral stenoses, mild proximally and moderate distally. At the anticipated level of proximal basilar artery there is severe short segment stenosis or occlusion. The mid basilar shows a more moderate narrowing. The right P1 segment is aplastic with fetal type circulation. There is atheromatous irregularity of the left more than right PCA with up to moderate left P2 segment stenoses Venous sinuses: Patent Anatomic variants: Fetal type right PCA Delayed phase: No abnormal intracranial enhancement. Review of the MIP images confirms the above findings IMPRESSION: 1. Advanced atherosclerosis in the head and neck. 2. Short segment atheromatous occlusion or critical stenosis in the proximal basilar. 3. Moderate atheromatous narrowing in the distal left V4 segment and mid basilar. The hypoplastic right vertebral artery that is effectively occluded at the dura. 4. 60-70% proximal right ICA and 40-50% right common carotid stenosis due to atherosclerotic plaque. 5. Known acute infarcts in the bilateral cerebellum and left occipital lobe (there is a fetal type right PCA). No hemorrhagic conversion or evidence of progression. Electronically Signed   By: Monte Fantasia M.D.    On: 03/17/2017 14:25   Mr Brain Wo Contrast  Result Date: 03/15/2017 CLINICAL DATA:  76 year old female with with evidence of new cerebellar infarcts from earlier this month on head CT today performed for abnormal left upper extremity movements and near syncope. EXAM: MRI HEAD WITHOUT CONTRAST MRA HEAD WITHOUT CONTRAST TECHNIQUE: Multiplanar, multiecho pulse sequences of the brain and surrounding structures were obtained without intravenous contrast. Angiographic images of the head were obtained using MRA technique without contrast. COMPARISON:  Head CT 0805 hours today.  Head CT 02/27/2017. FINDINGS: MRI HEAD FINDINGS Brain: Patchy restricted diffusion in both cerebellar hemispheres (series 6, image 12). No brainstem involvement. No deep gray matter nuclei involvement, but there are also several scattered small foci of restricted diffusion in the left occipital lobe. Associated T2 and FLAIR hyperintensity. No associated hemorrhage or mass effect. No anterior circulation restricted diffusion. No midline shift, mass effect, evidence of mass lesion, ventriculomegaly, extra-axial collection or acute intracranial hemorrhage. Cervicomedullary junction and pituitary are within normal limits. Patchy bilateral cerebral white matter T2 and FLAIR hyperintensity. No chronic cortical encephalomalacia or chronic cerebral blood products are identified. Signal in the deep gray matter nuclei and brainstem is normal for age. Vascular: Major intracranial vascular flow voids are preserved, the distal left vertebral artery appears dominant and is somewhat dolichoectatic. There is mild generalized intracranial artery tortuosity. Skull and upper cervical spine: Negative. Sinuses/Orbits: Normal orbits soft tissues. Visualized paranasal sinuses and mastoids are stable and well pneumatized. Other: Visible internal auditory structures appear normal. Negative scalp soft tissues. MRA HEAD FINDINGS Antegrade flow in the dominant distal  left vertebral artery. No distal left vertebral artery stenosis. Absent flow in the non dominant appearing right vertebral artery V4 segment, although there is faint flow signal in the distal right vertebral artery at the upper cervical spine. The basilar artery is patent, but there is severe proximal basilar stenosis, corresponding to an area  of prominent calcified plaque on the CT head today (series 755, image 10). This is proximal to a dominant appearing right AICA origin. The distal basilar is irregular without additional stenosis. SCA and left PCA origins are normal. There is a fetal type right PCA origin. The left posterior communicating artery is diminutive or absent. There is mild irregularity and attenuated distal flow in the left PCA. The distal right PCA appears normal. Antegrade flow in both ICA siphons. Evidence of a tortuous retropharyngeal course of 1 or board carotid arteries in the neck. No ICA siphon stenosis. Normal ophthalmic artery origins. Patent carotid termini. Normal MCA and ACA origins. Dominant left A1 segment. Anterior communicating artery and visible ACA branches are within normal limits. Visible bilateral MCA branches are within normal limits. IMPRESSION: 1. 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 No associated hemorrhage or mass effect. 2. 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 3. 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 4. Attenuated flow in the distal left PCA. 5. Mild to moderate for age nonspecific cerebral white matter signal changes. 6. Negative anterior circulation. 7. Salient findings discussed by telephone with Dr. Jeanene Erb on 03/15/2017 at 20:33 . Electronically Signed   By: Genevie Ann M.D.   On: 03/15/2017 20:33   Mr Jodene Nam Head/brain HE Cm  Result Date: 03/15/2017 CLINICAL DATA:  76 year old female with with evidence of new  cerebellar infarcts from earlier this month on head CT today performed for abnormal left upper extremity movements and near syncope. EXAM: MRI HEAD WITHOUT CONTRAST MRA HEAD WITHOUT CONTRAST TECHNIQUE: Multiplanar, multiecho pulse sequences of the brain and surrounding structures were obtained without intravenous contrast. Angiographic images of the head were obtained using MRA technique without contrast. COMPARISON:  Head CT 0805 hours today.  Head CT 02/27/2017. FINDINGS: MRI HEAD FINDINGS Brain: Patchy restricted diffusion in both cerebellar hemispheres (series 6, image 12). No brainstem involvement. No deep gray matter nuclei involvement, but there are also several scattered small foci of restricted diffusion in the left occipital lobe. Associated T2 and FLAIR hyperintensity. No associated hemorrhage or mass effect. No anterior circulation restricted diffusion. No midline shift, mass effect, evidence of mass lesion, ventriculomegaly, extra-axial collection or acute intracranial hemorrhage. Cervicomedullary junction and pituitary are within normal limits. Patchy bilateral cerebral white matter T2 and FLAIR hyperintensity. No chronic cortical encephalomalacia or chronic cerebral blood products are identified. Signal in the deep gray matter nuclei and brainstem is normal for age. Vascular: Major intracranial vascular flow voids are preserved, the distal left vertebral artery appears dominant and is somewhat dolichoectatic. There is mild generalized intracranial artery tortuosity. Skull and upper cervical spine: Negative. Sinuses/Orbits: Normal orbits soft tissues. Visualized paranasal sinuses and mastoids are stable and well pneumatized. Other: Visible internal auditory structures appear normal. Negative scalp soft tissues. MRA HEAD FINDINGS Antegrade flow in the dominant distal left vertebral artery. No distal left vertebral artery stenosis. Absent flow in the non dominant appearing right vertebral artery V4  segment, although there is faint flow signal in the distal right vertebral artery at the upper cervical spine. The basilar artery is patent, but there is severe proximal basilar stenosis, corresponding to an area of prominent calcified plaque on the CT head today (series 755, image 10). This is proximal to a dominant appearing right AICA origin. The distal basilar is irregular without additional stenosis. SCA and left PCA origins are normal. There  is a fetal type right PCA origin. The left posterior communicating artery is diminutive or absent. There is mild irregularity and attenuated distal flow in the left PCA. The distal right PCA appears normal. Antegrade flow in both ICA siphons. Evidence of a tortuous retropharyngeal course of 1 or board carotid arteries in the neck. No ICA siphon stenosis. Normal ophthalmic artery origins. Patent carotid termini. Normal MCA and ACA origins. Dominant left A1 segment. Anterior communicating artery and visible ACA branches are within normal limits. Visible bilateral MCA branches are within normal limits. IMPRESSION: 1. 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 No associated hemorrhage or mass effect. 2. 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 3. 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 4. Attenuated flow in the distal left PCA. 5. Mild to moderate for age nonspecific cerebral white matter signal changes. 6. Negative anterior circulation. 7. Salient findings discussed by telephone with Dr. Jeanene Erb on 03/15/2017 at 20:33 . Electronically Signed   By: Genevie Ann M.D.   On: 03/15/2017 20:33    Time Spent in minutes  30   Jani Gravel M.D on 03/20/2017 at 6:32 AM  Between 7am to 7pm - Pager - [phone]  After 7pm go to [email or web address] - password [facility]  [facility] -  Office  [phone]

## 2017-03-20 NOTE — Procedures (Signed)
S/P 4 vessel cerebral arteriogram. RT CFA approach. Findings. 1.Pre occlusive 95% plus stenosis  of   prox basilar artery stenosis. 2.Approx 50 % stenosis of Rt  ICA prox,and of RT ECA at origin.

## 2017-03-20 NOTE — Progress Notes (Signed)
STROKE TEAM PROGRESS NOTE   SUBJECTIVE (INTERVAL HISTORY) Pt RN is at the bedside.  No acute event overnight. Had DSA done showed proximal BA 95% stenosis. Pt will undergo BA angioplasty and/or stent next Monday.    OBJECTIVE Temp:  [97.7 F (36.5 C)-98.3 F (36.8 C)] 97.8 F (36.6 C) (07/05 1505) Pulse Rate:  [60-72] 61 (07/05 1505) Cardiac Rhythm: Normal sinus rhythm (07/05 1126) Resp:  [15-22] 16 (07/05 1505) BP: (127-165)/(51-90) 127/51 (07/05 1505) SpO2:  [93 %-100 %] 98 % (07/05 1505)  CBC:   Recent Labs Lab 03/15/17 0753 03/17/17 0630 03/20/17 0823  WBC 8.4 7.3 6.1  NEUTROABS 4.5  --   --   HGB 14.9 13.5 12.3  HCT 42.6 40.9 36.6  MCV 92.2 93.2 94.1  PLT 261 232 646    Basic Metabolic Panel:   Recent Labs Lab 03/15/17 0753 03/17/17 0630 03/20/17 0823  NA 137 136 139  K 4.1 4.2 3.8  CL 101 104 109  CO2 28 24 24   GLUCOSE 116* 98 92  BUN 14 16 7   CREATININE 0.79 0.92 0.87  CALCIUM 9.5 8.6* 8.1*  MG 1.7  --   --     Lipid Panel:     Component Value Date/Time   CHOL 272 (H) 03/16/2017 0717   TRIG 783 (H) 03/16/2017 0717   TRIG 239 03/07/2009   HDL 30 (L) 03/16/2017 0717   CHOLHDL 9.1 03/16/2017 0717   VLDL UNABLE TO CALCULATE IF TRIGLYCERIDE OVER 400 mg/dL 03/16/2017 0717   LDLCALC UNABLE TO CALCULATE IF TRIGLYCERIDE OVER 400 mg/dL 03/16/2017 0717   HgbA1c:  Lab Results  Component Value Date   HGBA1C 5.8 (H) 03/16/2017   Urine Drug Screen:     Component Value Date/Time   LABOPIA NONE DETECTED 03/15/2017 0825   COCAINSCRNUR NONE DETECTED 03/15/2017 0825   LABBENZ NONE DETECTED 03/15/2017 0825   AMPHETMU NONE DETECTED 03/15/2017 0825   THCU NONE DETECTED 03/15/2017 0825   LABBARB NONE DETECTED 03/15/2017 0825    Alcohol Level No results found for: [place] I have personally reviewed the radiological images below and agree with the radiology interpretations.  Ct Head Wo Contrast 03/15/2017 Worsening of areas of low-density in the  cerebellum consistent with small acute/ subacute cerebellar infarctions. No sign of hemorrhage or mass effect.    Mr Kathleen Dunn Head/brain Wo Cm 03/15/2017 1. 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 No associated hemorrhage or mass effect.  2. 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  3. 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  4. Attenuated flow in the distal left PCA.  5. Mild to moderate for age nonspecific cerebral white matter signal changes.  6. Negative anterior circulation.   CTA head and neck  1. Advanced atherosclerosis in the head and neck. 2. Short segment atheromatous occlusion or critical stenosis in the proximal basilar. 3. Moderate atheromatous narrowing in the distal left V4 segment and mid basilar. The hypoplastic right vertebral artery that is effectively occluded at the dura. 4. 60-70% proximal right ICA and 40-50% right common carotid stenosis due to atherosclerotic plaque. 5. Known acute infarcts in the bilateral cerebellum and left occipital lobe (there is a fetal type right PCA). No hemorrhagic conversion or evidence of progression.  DSA 1.Pre occlusive 95% plus stenosis  of   prox basilar artery stenosis. 2.Approx 50 % stenosis of Rt  ICA  prox,and of RT ECA at origin.  TTE - Left ventricle: The cavity size was normal. Wall thickness was   increased in a pattern of moderate LVH. Systolic function was   normal. The estimated ejection fraction was in the range of 60%   to 65%. Wall motion was normal; there were no regional wall   motion abnormalities. Doppler parameters are consistent with   abnormal left ventricular relaxation (grade 1 diastolic   dysfunction). - Aortic valve: There was trivial regurgitation. - Pericardium, extracardiac: A trivial pericardial effusion was   identified.  EEG - A normal EEG does not exclude a  clinical diagnosis of epilepsy.  If further clinical questions remain, prolonged EEG may be helpful.  Clinical correlation is advised.   PHYSICAL EXAM  Temp:  [97.7 F (36.5 C)-98.3 F (36.8 C)] 97.8 F (36.6 C) (07/05 1505) Pulse Rate:  [60-72] 61 (07/05 1505) Resp:  [15-22] 16 (07/05 1505) BP: (127-165)/(51-90) 127/51 (07/05 1505) SpO2:  [93 %-100 %] 98 % (07/05 1505)  General - Well nourished, well developed, in no apparent distress.  Ophthalmologic - Fundi not visualized due to noncooperation.  Cardiovascular - Regular rate and rhythm.  Mental Status -  Level of arousal and orientation to time, place, and person were intact. Language including expression, naming, repetition, comprehension was assessed and found intact. Fund of Knowledge was assessed and was intact.  Cranial Nerves II - XII - II - Visual field intact OU. III, IV, VI - Extraocular movements intact. V - Facial sensation intact bilaterally. VII - Facial movement intact bilaterally. VIII - Hearing & vestibular intact bilaterally. X - Palate elevates symmetrically. XI - Chin turning & shoulder shrug intact bilaterally. XII - Tongue protrusion intact.  Motor Strength - The patient's strength was normal in all extremities and pronator drift was absent.  Bulk was normal and fasciculations were absent.   Motor Tone - Muscle tone was assessed at the neck and appendages and was normal.  Reflexes - The patient's reflexes were 1+ in all extremities and she had no pathological reflexes.  Sensory - Light touch, temperature/pinprick were assessed and were symmetrical.    Coordination - The patient had normal movements in the hands and feet with no ataxia or dysmetria.  Essential tremor was present b/l, more on the right than left.  Gait and Station - deferred   ASSESSMENT/PLAN [name] is a 76 y.o. female with history of hypertension, asthma, known lung nodule, and thyroid disease presenting with  uncontrollable movements of the left upper extremity. She did not receive IV t-PA due to transient deficits.  Strokes:  scattered acute infarcts in both cerebellar hemispheres and the Lt PCA territory - likely embolic from [facility].  Resultant  No deficit  CT head - small acute/ subacute cerebellar infarctions.  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  CTA H&N - proximal BA high-grade stenosis, mid BA atherosclerosis with post stenotic dilatation, left VA atherosclerosis, right VA hypoplastic with distal occlusion, proximal right ICA 60-70% stenosis and proximal left ICA 40-50% stenosis.  Cerebral angio - proximal BA > 95% stenosis  EEG - normal  2D Echo - unremarkable  LDL - unable to calculate - triglycerides 783  HgbA1c 5.8  VTE prophylaxis - Lovenox Diet Carb Modified Fluid consistency: Thin; Room service appropriate? Yes  No antithrombotic prior  to admission, now on aspirin 325 mg daily and clopidogrel 75 mg daily.  Patient counseled to be compliant with her antithrombotic medications  Ongoing aggressive stroke risk factor management  Therapy recommendations: pending  Disposition:  Pending - pt would undergo BA angioplasty and/or stenting next Monday with Dr. Estanislado Pandy  Posterior circulation extensive athero  MRA showed BA severe stenosis, right VA occlusion and left PCA decreased flow  CTA head and neck proximal BA high-grade stenosis, mid BA atherosclerosis with post stenotic dilatation, left VA atherosclerosis, right VA hypoplastic with distal occlusion  DSA proximal BA > 95% stenosis  Continue DAPT  Carotid stenosis  CTA head and neck - proximal right ICA 60-70% stenosis and proximal left ICA 40-50% stenosis.  DSA about 50% stenosis of right ICA  On DAPT  Hypertension  Stable  On home meds - low  dose metoprolol and losartan BP goal 130-160 due to intracranial stenosis  Hyperlipidemia  Home meds:  No lipid lowering medications prior to admission  LDL could not be calculated secondary to elevated triglycerides, goal < 70  Now on crestor 40 and zetia 10  Continue at discharge  Other Stroke Risk Factors  Advanced age  Former cigarette smoker - quit 45 years ago.  Obesity, Body mass index is 35.9 kg/m., recommend weight loss, diet and exercise as appropriate   Depo Estradiol PTA  Other Active Problems    Hospital day # 4  Rosalin Hawking, MD PhD Stroke Neurology 03/20/2017 4:10 PM   To contact Stroke Continuity provider, please refer to [email or web address] After hours, contact General Neurology

## 2017-03-20 NOTE — Sedation Documentation (Signed)
IR tech placing exoseal to R groin 

## 2017-03-20 NOTE — Sedation Documentation (Signed)
Family updated as to patient's status.

## 2017-03-20 NOTE — Progress Notes (Signed)
PT Cancellation Note  Patient Details Name: Aalaysia Liggins MRN: [id number] DOB: 1941/05/13   Cancelled Treatment:    Reason Eval/Treat Not Completed: Medical issues which prohibited therapy Pt currently on bed rest following procedure. Will follow up once bedrest orders removed.   Leighton Ruff, PT, DPT  Acute Rehabilitation Services  Pager: [phone]    Rudean Hitt 03/20/2017, 2:46 PM

## 2017-03-20 NOTE — Sedation Documentation (Signed)
Pt has been talking very frequently, RN explains now that it is very important that she relax with help of meds and not talk during procedure unless there is a problem. Pt states her understanding

## 2017-03-20 NOTE — Sedation Documentation (Addendum)
Continuing to hold pressure to R groin

## 2017-03-20 NOTE — Sedation Documentation (Signed)
IR tech holding pressure to R groin 

## 2017-03-20 NOTE — Progress Notes (Signed)
Attempted to send for pt this morning for angiogram with Dr Estanislado Pandy. Pt states that she wants to wait until her family gets here. Pt states she would like to come down at 1130. IR schedule today cannot accommodate pt at 1130 due to outpatient schedule.

## 2017-03-20 NOTE — Progress Notes (Signed)
Pt nurse returned call to Radiology nurse stating that she needs time to think about it. She would like to know what times are available tomorrow. RN told pt nurse that we cannot promise a time tomorrow given that outpatients could be added on and emergencies can arise. Call ended. Pt nurse then called back immediately stating that pt is now ready to come down. Transport is retrieving pt to come to IR for angiogram at this time 0941-03/20/17

## 2017-03-20 NOTE — Sedation Documentation (Signed)
Pressure hold complete- pressure dsg on- instructions given

## 2017-03-21 ENCOUNTER — Encounter (HOSPITAL_COMMUNITY): Payer: Self-pay | Admitting: Interventional Radiology

## 2017-03-21 LAB — COMPREHENSIVE METABOLIC PANEL
ALK PHOS: 43 U/L (ref 38–126)
ALT: 27 U/L (ref 14–54)
ANION GAP: 8 (ref 5–15)
AST: 39 U/L (ref 15–41)
Albumin: 3.4 g/dL — ABNORMAL LOW (ref 3.5–5.0)
BILIRUBIN TOTAL: 1 mg/dL (ref 0.3–1.2)
BUN: 8 mg/dL (ref 6–20)
CALCIUM: 8.5 mg/dL — AB (ref 8.9–10.3)
CO2: 25 mmol/L (ref 22–32)
CREATININE: 0.89 mg/dL (ref 0.44–1.00)
Chloride: 105 mmol/L (ref 101–111)
Glucose, Bld: 122 mg/dL — ABNORMAL HIGH (ref 65–99)
Potassium: 4.3 mmol/L (ref 3.5–5.1)
SODIUM: 138 mmol/L (ref 135–145)
TOTAL PROTEIN: 6 g/dL — AB (ref 6.5–8.1)

## 2017-03-21 LAB — CBC
HEMATOCRIT: 38.6 % (ref 36.0–46.0)
HEMOGLOBIN: 13 g/dL (ref 12.0–15.0)
MCH: 31.7 pg (ref 26.0–34.0)
MCHC: 33.7 g/dL (ref 30.0–36.0)
MCV: 94.1 fL (ref 78.0–100.0)
Platelets: 242 10*3/uL (ref 150–400)
RBC: 4.1 MIL/uL (ref 3.87–5.11)
RDW: 13.3 % (ref 11.5–15.5)
WBC: 6.9 10*3/uL (ref 4.0–10.5)

## 2017-03-21 MED ORDER — LOPERAMIDE HCL 2 MG PO CAPS: 2.0000 mg | ORAL_CAPSULE | ORAL | Status: DC | PRN

## 2017-03-21 MED ORDER — LOPERAMIDE HCL 2 MG PO CAPS
4.0000 mg | ORAL_CAPSULE | Freq: Once | ORAL | Status: AC
  Administered 2017-03-21: 4 mg via ORAL
  Filled 2017-03-21: qty 2

## 2017-03-21 MED ORDER — BENZONATATE 100 MG PO CAPS
100.0000 mg | ORAL_CAPSULE | Freq: Three times a day (TID) | ORAL | Status: DC | PRN
  Administered 2017-03-21 – 2017-03-31 (×13): 100 mg via ORAL
  Filled 2017-03-21 (×16): qty 1

## 2017-03-21 NOTE — Plan of Care (Signed)
Pt will stay for angioplasty and/or stent of basilar artery stenosis next Monday with Dr. Estanislado Pandy. We will see her after the procedure. At meantime, please call stroke team if there is any questions.   Rosalin Hawking, MD PhD Stroke Neurology 03/21/2017 12:52 PM

## 2017-03-21 NOTE — Progress Notes (Addendum)
Occupational Therapy Treatment Patient Details Name: Kathleen Dunn MRN: [id number] DOB: October 20, 1940 Today's Date: 03/21/2017    History of present illness Pt is a 76 yo female admitted through ED on 03/15/17 due to an episode of L arm twisting, dizziness and fatigue. Pt was diagnosed with subacute cerebellar stroke. PMH significant for asthma, HTN, thyroid disease, anxiety.     OT comments  Limited tx today;pt fatiqued.  Pt tends to hold to furniture when walking  Follow Up Recommendations    No OT follow up   Equipment Recommendations  3: 1 commode for shower    Recommendations for Other Services      Precautions / Restrictions Precautions Precautions: Fall Restrictions Weight Bearing Restrictions: No       Mobility Bed Mobility Overal bed mobility: Modified Independent             General bed mobility comments: use of rail  Transfers Overall transfer level: Modified independent Equipment used: None                  Balance                                           ADL either performed or assessed with clinical judgement   ADL       Grooming: Wash/dry hands;Supervision/safety;Standing                   Toilet Transfer: Supervision/safety;Ambulation;Regular Toilet             General ADL Comments: pt tends to furniture walk.  assist for lines.  She states that she slept poorly and only wanted to go to bathroom this am.  Also light is bothering her eyes at this time; she describes this as happening intermittently.     Vision   Additional Comments: pt describes photosensitivity   Perception     Praxis      Cognition Arousal/Alertness: Awake/alert Behavior During Therapy: WFL for tasks assessed/performed Overall Cognitive Status: Within Functional Limits for tasks assessed                                          Exercises     Shoulder Instructions       General Comments       Pertinent Vitals/ Pain       Pain Assessment: Faces Faces Pain Scale: Hurts a little bit Pain Location: peri area Pain Descriptors / Indicators: Sore Pain Intervention(s): Limited activity within patient's tolerance;Monitored during session  Home Living                                          Prior Functioning/Environment              Frequency  Min 2X/week        Progress Toward Goals  OT Goals(current goals can now be found in the care plan section)  Progress towards OT goals: Progressing toward goals  Acute Rehab OT Goals Patient Stated Goal: to feel better and get back home OT Goal Formulation: With patient Time For Goal Achievement: 04/01/17 Potential to Achieve Goals: Good  Plan      Co-evaluation  AM-PAC PT "6 Clicks" Daily Activity     Outcome Measure   Help from another person eating meals?: None Help from another person taking care of personal grooming?: A Little Help from another person toileting, which includes using toliet, bedpan, or urinal?: A Little Help from another person bathing (including washing, rinsing, drying)?: A Little Help from another person to put on and taking off regular upper body clothing?: A Little Help from another person to put on and taking off regular lower body clothing?: A Little 6 Click Score: 19    End of Session    OT Visit Diagnosis: Unsteadiness on feet (R26.81);Muscle weakness (generalized) (M62.81)   Activity Tolerance Patient tolerated treatment well   Patient Left  (EOB with RN for meds)   Nurse Communication          Time: 1287-8676 OT Time Calculation (min): 14 min  Charges: OT General Charges $OT Visit: 1 Procedure OT Treatments $Self Care/Home Management : 8-22 mins Lesle Chris, OTR/L [phone] 03/21/2017   [place] 03/21/2017, 9:33 AM

## 2017-03-21 NOTE — Consult Note (Signed)
           [facility] CM Primary Care Navigator  03/21/2017  Kathleen Dunn 1941-04-06 [id number]   Went to see patient at the bedside to identify possible discharge needs but she was talking on the phone and told writer to come back.  Attempted to go back later and talk to patient but physical therapist was working with her at this time.  Went back after physical therapy session to meet with patient, but she states that she "feels tired" and all she wants is to eat her supper without interruptions for now.   Will attempt to meet with patient at another time when she is available in the room.   For questions, please contact:  Dannielle Huh, BSN, RN- [facility] Primary Care Navigator  Telephone: [phone] [place]

## 2017-03-21 NOTE — Progress Notes (Signed)
Physical Therapy Treatment Patient Details Name: Kathleen Dunn MRN: [id number] DOB: 01-Dec-1940 Today's Date: 03/21/2017    History of Present Illness Pt is a 76 yo female admitted through ED on 03/15/17 due to an episode of L arm twisting, dizziness and fatigue. Pt was diagnosed with subacute cerebellar stroke. PMH significant for asthma, HTN, thyroid disease, anxiety.      PT Comments    Emphasis on balance and improving gait speed/quality.  DGI 21/24 up from 14 on eval.    Follow Up Recommendations  No PT follow up     Equipment Recommendations  None recommended by PT    Recommendations for Other Services       Precautions / Restrictions Precautions Precautions: Fall    Mobility  Bed Mobility Overal bed mobility: Modified Independent                Transfers Overall transfer level: Modified independent Equipment used: None                Ambulation/Gait Ambulation/Gait assistance: Supervision Ambulation Distance (Feet): 700 Feet Assistive device: None Gait Pattern/deviations: Step-through pattern;Decreased stride length Gait velocity: prefers slower, but able to speed up significantly to cue. Gait velocity interpretation: at or above normal speed for age/gender General Gait Details: generally steady and safe, see DGI   Stairs Stairs: Yes   Stair Management: Two rails;Forwards;Step to pattern Number of Stairs: 4 General stair comments: s  Wheelchair Mobility    Modified Rankin (Stroke Patients Only)       Balance Overall balance assessment: Needs assistance   Sitting balance-Leahy Scale: Normal       Standing balance-Leahy Scale: Fair (or better)                   Standardized Balance Assessment Standardized Balance Assessment : Dynamic Gait Index   Dynamic Gait Index Level Surface: Normal Change in Gait Speed: Normal Gait with Horizontal Head Turns: Normal Gait with Vertical Head Turns: Normal Gait and Pivot Turn:  Normal Step Over Obstacle: Mild Impairment Step Around Obstacles: Normal Steps: Moderate Impairment Total Score: 21      Cognition Arousal/Alertness: Awake/alert Behavior During Therapy: WFL for tasks assessed/performed Overall Cognitive Status: Within Functional Limits for tasks assessed                                        Exercises      General Comments General comments (skin integrity, edema, etc.): improvement in fall risk per DGI      Pertinent Vitals/Pain Pain Assessment: No/denies pain    Home Living                      Prior Function            PT Goals (current goals can now be found in the care plan section) Acute Rehab PT Goals Patient Stated Goal: to feel better and get back home PT Goal Formulation: With patient Time For Goal Achievement: 03/30/17 Potential to Achieve Goals: Good Progress towards PT goals: Progressing toward goals    Frequency    Min 3X/week      PT Plan Current plan remains appropriate    Co-evaluation              AM-PAC PT "6 Clicks" Daily Activity  Outcome Measure  Difficulty turning over in bed (including adjusting bedclothes, sheets and  blankets)?: None Difficulty moving from lying on back to sitting on the side of the bed? : None Difficulty sitting down on and standing up from a chair with arms (e.g., wheelchair, bedside commode, etc,.)?: None Help needed moving to and from a bed to chair (including a wheelchair)?: A Little Help needed walking in hospital room?: A Little Help needed climbing 3-5 steps with a railing? : A Little 6 Click Score: 21    End of Session   Activity Tolerance: Patient tolerated treatment well Patient left: in bed;with call bell/phone within reach Nurse Communication: Mobility status PT Visit Diagnosis: Unsteadiness on feet (R26.81)     Time: 0149-9692 PT Time Calculation (min) (ACUTE ONLY): 24 min  Charges:  $Gait Training: 8-22 mins $Therapeutic  Activity: 8-22 mins                    G Codes:       03-30-17  Donnella Sham, PT [phone]  (pager)   [name] Caela Huot Mar 30, 2017, 7:03 PM

## 2017-03-21 NOTE — Progress Notes (Signed)
Patient ID: Kathleen Dunn, female   DOB: 06-28-1941, 76 y.o.   MRN: [id number]                                                                PROGRESS NOTE                                                                                                                                                                                                             Patient Demographics:    Kathleen Dunn, is a 76 y.o. female, DOB - February 20, 1941, KVQ:[id number]  Admit date - 03/15/2017   Admitting Physician Truett Mainland, DO  Outpatient Primary MD for the patient is Hali Marry, MD  LOS - 5  Outpatient Specialists:    Chief Complaint  Patient presents with  . Near Syncope       Brief Narrative  HPI on 03/15/2017 by Dr. Loma Boston Lamona Saundersis a 76 y.o.femalewith a history of asthma, hypertension, thyroid disease, anxiety, on Desogen therapy for hormone replacement. Patient was seen at [facility] for near syncope on June 14 and received a head CT which showed no acute intracranial abnormality but evidence of an old small left cerebellar infarct with small vessel ischemic changes in the white matter. Over the past 2 days the patient has had intermittent shaking and "twisting" of her left hand which last for several minutes. She has been feeling lightheaded over the past few days and took Dramamine twice, which preceded the episodes of shaking and twisting. She denies vertigo, chest pain, weakness, sensory changes. Her CT scan today showed worsening areas of low density in the cerebellum consistent with small acute/subacute cerebellar infarctions.  Interim history  Found to have Acute CVA on MRI. Neuro consulted. Plan for cerebral angiogram.    Subjective:    Kathleen Dunn today is feeling fine. No weakness.  No numbness, tingling .  No headache, No chest pain, No abdominal pain - No Nausea, No new weakness tingling or numbness, No Cough - SOB.    Assessment  & Plan :    Principal Problem:   Cerebellar stroke (HCC) Active Problems:   Hypothyroidism   HYPERCHOLESTEROLEMIA   HYPERTENSION, BENIGN SYSTEMIC   GAD (generalized anxiety disorder)   Mixed hyperlipidemia   Basilar artery stenosis   Bilateral carotid  artery stenosis   Cerebrovascular accident (CVA) due to thrombosis of precerebral artery (HCC)   Acute CVA/Cerebellar stroke -Presented with near syncope, complains of visual changes -Patient currently having word/phrase repetition -CT head: Worsening of areas of low density in the cerebellum consistent with small acute/subacute cerebellar infarctions -MRI brain: Scattered acute infarcts in both cerebellar hemispheres and left PCA territory -MRA head: Age-indeterminate occlusion of the nondominant distal right vertebral artery, severe proximal basilar artery stenosis -Echocardiogram: EF 15-72%, grade 1 diastolic dysfunction. No RWMA -Hemoglobin A1c 5.8 -LDL unable to calculate -PT and speech consulted and patient has no further needs -pending speech and OT consults (patient does live alone) -Continue aspirin, statin, plavix -Neurology consulted and appreciated -Interventional radiology consulted for cerebral angiogram- pending.  -Patient is hesitant and wanting more information before having additional testing down. Explained to patient that she should discuss this with her family and neurology.  -EEG: normal  Cerebral angiogram 7/5, awaiting repeat Angiogram Monday ?  Essential hypertension -Continue metoprolol, will hold triamterene/HCTZ  Hyperlipidemia -Lipid panel: Total cholesterol 272, HDL 30, LDL unable to calculate, triglycerides 783 -Currently on statin, however switched to Crestor 40mg  daily and Zetia added Add vascepa (not on formulary, will add at discharge)  Hypothyroidism -Continue Synthroid  Anxiety -d/c lorazepam. Continue Klonopin as needed  Knee pain voltaren gel 2gm topically qid prn   DVT  ProphylaxisLovenox  Code Status:Full  Family Communication:None at bedside  Disposition Plan:Admitted. Pending further workup and evaluations. Dispo TBD  Consultants Neurology Interventional radiology   Procedures  Echocardiogram 7/1=>EF 60-65%, trivial pericardial effusion, grade 1 diastolic dysfunction MRI brain 6/30=> IMPRESSION: 1. 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 No associated hemorrhage or mass effect. 2. 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 3. 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 4. Attenuated flow in the distal left PCA. 5. Mild to moderate for age nonspecific cerebral white matter signal changes. 6. Negative anterior circulation.  CTA head/neck 7/2=> IMPRESSION: 1. Advanced atherosclerosis in the head and neck. 2. Short segment atheromatous occlusion or critical stenosis in the proximal basilar. 3. Moderate atheromatous narrowing in the distal left V4 segment and mid basilar. The hypoplastic right vertebral artery that is effectively occluded at the dura. 4. 60-70% proximal right ICA and 40-50% right common carotid stenosis due to atherosclerotic plaque. 5. Known acute infarcts in the bilateral cerebellum and left occipital lobe (there is a fetal type right PCA). No hemorrhagic conversion or evidence of progression.      Lab Results  Component Value Date   PLT 219 03/20/2017     Anti-infectives    None        Objective:   Vitals:   03/20/17 1505 03/20/17 2148 03/21/17 0059 03/21/17 0623  BP: (!) 127/51 (!) 125/54 131/61 (!) 146/84  Pulse: 61 66 62 76  Resp: 16 18 18 18   Temp: 97.8 F (36.6 C) 98 F (36.7 C) 98 F (36.7 C) (!) 97.5 F (36.4 C)  TempSrc:  Oral Oral Oral  SpO2: 98% 96% 99% 100%  Weight:      Height:        Wt Readings from Last 3 Encounters:    03/15/17 86.2 kg (190 lb)  02/27/17 86.2 kg (190 lb)  11/03/15 82.6 kg (182 lb)     Intake/Output Summary (Last 24 hours) at 03/21/17 0759 Last data filed at 03/21/17 0603  Gross per  24 hour  Intake           868.75 ml  Output             1100 ml  Net          -231.25 ml     Physical Exam  Awake Alert, Oriented X 3, No new F.N deficits, Normal affect .AT,PERRAL Supple Neck,No JVD, No cervical lymphadenopathy appriciated.  Symmetrical Chest wall movement, Good air movement bilaterally, CTAB RRR,No Gallops,Rubs or new Murmurs, No Parasternal Heave +ve B.Sounds, Abd Soft, No tenderness, No organomegaly appriciated, No rebound - guarding or rigidity. No Cyanosis, Clubbing or edema, No new Rash or bruise  Good finger to nose    Data Review:    CBC  Recent Labs Lab 03/15/17 0753 03/17/17 0630 03/20/17 0823  WBC 8.4 7.3 6.1  HGB 14.9 13.5 12.3  HCT 42.6 40.9 36.6  PLT 261 232 219  MCV 92.2 93.2 94.1  MCH 32.3 30.8 31.6  MCHC 35.0 33.0 33.6  RDW 13.2 13.3 13.4  LYMPHSABS 2.6  --   --   MONOABS 0.9  --   --   EOSABS 0.4  --   --   BASOSABS 0.0  --   --     Chemistries   Recent Labs Lab 03/15/17 0753 03/17/17 0630 03/20/17 0823  NA 137 136 139  K 4.1 4.2 3.8  CL 101 104 109  CO2 28 24 24   GLUCOSE 116* 98 92  BUN 14 16 7   CREATININE 0.79 0.92 0.87  CALCIUM 9.5 8.6* 8.1*  MG 1.7  --   --   AST 21  --  19  ALT 18  --  17  ALKPHOS 43  --  35*  BILITOT 0.4  --  0.6   ------------------------------------------------------------------------------------------------------------------ No results for input(s): CHOL, HDL, LDLCALC, TRIG, CHOLHDL, LDLDIRECT in the last 72 hours.  Lab Results  Component Value Date   HGBA1C 5.8 (H) 03/16/2017   ------------------------------------------------------------------------------------------------------------------ No results for input(s): TSH, T4TOTAL, T3FREE, THYROIDAB in the last 72 hours.  Invalid input(s):  FREET3 ------------------------------------------------------------------------------------------------------------------ No results for input(s): VITAMINB12, FOLATE, FERRITIN, TIBC, IRON, RETICCTPCT in the last 72 hours.  Coagulation profile  Recent Labs Lab 03/17/17 0630 03/20/17 0823  INR 1.09 1.03    No results for input(s): DDIMER in the last 72 hours.  Cardiac Enzymes No results for input(s): CKMB, TROPONINI, MYOGLOBIN in the last 168 hours.  Invalid input(s): CK ------------------------------------------------------------------------------------------------------------------ No results found for: BNP  Inpatient Medications  Scheduled Meds: .  stroke: mapping our early stages of recovery book   Does not apply Once  . aspirin EC  325 mg Oral Daily  . budesonide (PULMICORT) nebulizer solution  0.25 mg Nebulization BID  . clonazePAM  1 mg Oral QHS  . clopidogrel  75 mg Oral Daily  . enoxaparin (LOVENOX) injection  40 mg Subcutaneous Q24H  . ezetimibe  10 mg Oral Daily  . famotidine  20 mg Oral BID  . levothyroxine  50 mcg Oral QAC breakfast  . losartan  50 mg Oral Daily  . rosuvastatin  40 mg Oral q1800   Continuous Infusions: . sodium chloride 50 mL/hr at 03/21/17 0548   PRN Meds:.acetaminophen **OR** acetaminophen (TYLENOL) oral liquid 160 mg/5 mL **OR** acetaminophen, acetic acid-hydrocortisone, clonazePAM, diclofenac sodium, ondansetron (ZOFRAN) IV, senna-docusate  Micro Results No results found for this or any previous visit (from the past 240 hour(s)).  Radiology Reports Ct Angio Head W Or Wo Contrast  Result  Date: 03/17/2017 CLINICAL DATA:  Stroke follow-up. EXAM: CT ANGIOGRAPHY HEAD AND NECK TECHNIQUE: Multidetector CT imaging of the head and neck was performed using the standard protocol during bolus administration of intravenous contrast. Multiplanar CT image reconstructions and MIPs were obtained to evaluate the vascular anatomy. Carotid stenosis  measurements (when applicable) are obtained utilizing NASCET criteria, using the distal internal carotid diameter as the denominator. CONTRAST:  50 cc Isovue 350 intravenous COMPARISON:  Brain MRI 03/15/2017 FINDINGS: CT HEAD FINDINGS Brain: Patchy acute to subacute infarcts in the bilateral cerebrum and parasagittal left occipital lobe as seen on previous brain MRI. No evidence of infarct progression. No hemorrhagic conversion. No hydrocephalus. Chronic microvascular ischemia in the cerebral white matter. Vascular: See below Skull: No acute or aggressive finding Sinuses: Negative Orbits: Negative Review of the MIP images confirms the above findings CTA NECK FINDINGS Aortic arch: Atherosclerosis without aneurysm or acute finding. Three vessel branching. Right carotid system: Atheromatous plaque at the common carotid origin without stenosis. There is prominent noncalcified plaque on the common carotid distally; stenosis measures 40-50%. Moderate calcified plaque at the common carotid bifurcation with stenosis measuring up to 60%. No superimposed ulceration. ICA tortuosity with vessel reaching the midline retropharynx. Left carotid system: Comparatively mild calcified and noncalcified plaque at the common carotid bifurcation and ICA bulb without stenosis or ulceration. Prominent ICA tortuosity reaching the midline retropharynx. Vertebral arteries: Left dominant vertebral artery that is widely patent to the dura. Thready flow in the diminutive right vertebral artery. Skeleton: No acute or aggressive finding. Other neck: No evidence of incidental mass or inflammation. Upper chest: Calcified granuloma in the left upper lobe. Review of the MIP images confirms the above findings CTA HEAD FINDINGS Anterior circulation: Calcified plaquing in the carotid siphons without flow limiting stenosis. Hypoplastic right A1 segment. No major branch occlusion is seen. There is atheromatous irregularity of bilateral MCA vessels without  flow limiting and reversible stenosis. Posterior circulation: No visible flow within the hypoplastic distal right V4 segment. 2 left vertebral stenoses, mild proximally and moderate distally. At the anticipated level of proximal basilar artery there is severe short segment stenosis or occlusion. The mid basilar shows a more moderate narrowing. The right P1 segment is aplastic with fetal type circulation. There is atheromatous irregularity of the left more than right PCA with up to moderate left P2 segment stenoses Venous sinuses: Patent Anatomic variants: Fetal type right PCA Delayed phase: No abnormal intracranial enhancement. Review of the MIP images confirms the above findings IMPRESSION: 1. Advanced atherosclerosis in the head and neck. 2. Short segment atheromatous occlusion or critical stenosis in the proximal basilar. 3. Moderate atheromatous narrowing in the distal left V4 segment and mid basilar. The hypoplastic right vertebral artery that is effectively occluded at the dura. 4. 60-70% proximal right ICA and 40-50% right common carotid stenosis due to atherosclerotic plaque. 5. Known acute infarcts in the bilateral cerebellum and left occipital lobe (there is a fetal type right PCA). No hemorrhagic conversion or evidence of progression. Electronically Signed   By: Monte Fantasia M.D.   On: 03/17/2017 14:25   Dg Lumbar Spine Complete  Result Date: 03/15/2017 CLINICAL DATA:  Pt complains of lower back pain x 5 days with fall, near syncopal episode and dizziness x 2 days EXAM: LUMBAR SPINE - COMPLETE 4+ VIEW COMPARISON:  None. FINDINGS: No fracture.  No spondylolisthesis. Moderate to marked loss of disc height at L1-L2 with endplate sclerosis and osteophytes. Mild loss disc height at T12-L1 and L3-L4. There  facet degenerative changes most evident in the lower lumbar spine. Dense aortic vascular calcifications are noted. No evidence of an aneurysm. There are other scattered vascular calcifications. Soft  tissues otherwise unremarkable. IMPRESSION: 1. No fracture or acute finding. 2. Degenerative changes as detailed. Electronically Signed   By: Lajean Manes M.D.   On: 03/15/2017 08:26   Ct Head Wo Contrast  Result Date: 03/15/2017 CLINICAL DATA:  Near syncope.  Left arm shaking. EXAM: CT HEAD WITHOUT CONTRAST TECHNIQUE: Contiguous axial images were obtained from the base of the skull through the vertex without intravenous contrast. COMPARISON:  02/27/2017 FINDINGS: Brain: Worsening of areas of low-density in the cerebellar hemispheres, more notable on the right, consistent with subacute cerebellar infarctions. No sign of hemorrhage or swelling. Chronic small-vessel ischemic changes affect the pons. Cerebral hemispheres show chronic small-vessel ischemic changes of the white matter. No cortical supratentorial infarction identified. No mass lesion, hemorrhage, hydrocephalus or extra-axial collection. Vascular: There is atherosclerotic calcification of the major vessels at the base of the brain. Skull: Negative Sinuses/Orbits: Clear/normal Other: None significant IMPRESSION: Worsening of areas of low-density in the cerebellum consistent with small acute/ subacute cerebellar infarctions. No sign of hemorrhage or mass effect. Electronically Signed   By: Nelson Chimes M.D.   On: 03/15/2017 08:11   Ct Head Wo Contrast  Result Date: 02/27/2017 CLINICAL DATA:  Syncope, congestion and flu like symptoms EXAM: CT HEAD WITHOUT CONTRAST TECHNIQUE: Contiguous axial images were obtained from the base of the skull through the vertex without intravenous contrast. COMPARISON:  None. FINDINGS: Brain: No acute territorial infarction, hemorrhage or intracranial mass is seen. Probable old small infarct in the left cerebellum. Mild periventricular small vessel ischemic changes of the white matter. Mild atrophy. Ventricles size within normal limits. Vascular: No hyperdense vessels. Carotid artery calcification. Vertebral artery  calcification. Skull: No fracture or suspicious bone lesion. Sinuses/Orbits: Mucosal thickening in the ethmoid sinuses. No acute orbital abnormality. Other: Small scalp swelling right posterior vertex. IMPRESSION: 1. No definite CT evidence for acute intracranial abnormality. 2. Suspect old small left cerebellar infarct. Mild small vessel ischemic changes of the white matter 3. Small right posterior scalp swelling Electronically Signed   By: Donavan Foil M.D.   On: 02/27/2017 19:37   Ct Angio Neck W Or Wo Contrast  Result Date: 03/17/2017 CLINICAL DATA:  Stroke follow-up. EXAM: CT ANGIOGRAPHY HEAD AND NECK TECHNIQUE: Multidetector CT imaging of the head and neck was performed using the standard protocol during bolus administration of intravenous contrast. Multiplanar CT image reconstructions and MIPs were obtained to evaluate the vascular anatomy. Carotid stenosis measurements (when applicable) are obtained utilizing NASCET criteria, using the distal internal carotid diameter as the denominator. CONTRAST:  50 cc Isovue 350 intravenous COMPARISON:  Brain MRI 03/15/2017 FINDINGS: CT HEAD FINDINGS Brain: Patchy acute to subacute infarcts in the bilateral cerebrum and parasagittal left occipital lobe as seen on previous brain MRI. No evidence of infarct progression. No hemorrhagic conversion. No hydrocephalus. Chronic microvascular ischemia in the cerebral white matter. Vascular: See below Skull: No acute or aggressive finding Sinuses: Negative Orbits: Negative Review of the MIP images confirms the above findings CTA NECK FINDINGS Aortic arch: Atherosclerosis without aneurysm or acute finding. Three vessel branching. Right carotid system: Atheromatous plaque at the common carotid origin without stenosis. There is prominent noncalcified plaque on the common carotid distally; stenosis measures 40-50%. Moderate calcified plaque at the common carotid bifurcation with stenosis measuring up to 60%. No superimposed  ulceration. ICA tortuosity with vessel reaching  the midline retropharynx. Left carotid system: Comparatively mild calcified and noncalcified plaque at the common carotid bifurcation and ICA bulb without stenosis or ulceration. Prominent ICA tortuosity reaching the midline retropharynx. Vertebral arteries: Left dominant vertebral artery that is widely patent to the dura. Thready flow in the diminutive right vertebral artery. Skeleton: No acute or aggressive finding. Other neck: No evidence of incidental mass or inflammation. Upper chest: Calcified granuloma in the left upper lobe. Review of the MIP images confirms the above findings CTA HEAD FINDINGS Anterior circulation: Calcified plaquing in the carotid siphons without flow limiting stenosis. Hypoplastic right A1 segment. No major branch occlusion is seen. There is atheromatous irregularity of bilateral MCA vessels without flow limiting and reversible stenosis. Posterior circulation: No visible flow within the hypoplastic distal right V4 segment. 2 left vertebral stenoses, mild proximally and moderate distally. At the anticipated level of proximal basilar artery there is severe short segment stenosis or occlusion. The mid basilar shows a more moderate narrowing. The right P1 segment is aplastic with fetal type circulation. There is atheromatous irregularity of the left more than right PCA with up to moderate left P2 segment stenoses Venous sinuses: Patent Anatomic variants: Fetal type right PCA Delayed phase: No abnormal intracranial enhancement. Review of the MIP images confirms the above findings IMPRESSION: 1. Advanced atherosclerosis in the head and neck. 2. Short segment atheromatous occlusion or critical stenosis in the proximal basilar. 3. Moderate atheromatous narrowing in the distal left V4 segment and mid basilar. The hypoplastic right vertebral artery that is effectively occluded at the dura. 4. 60-70% proximal right ICA and 40-50% right common carotid  stenosis due to atherosclerotic plaque. 5. Known acute infarcts in the bilateral cerebellum and left occipital lobe (there is a fetal type right PCA). No hemorrhagic conversion or evidence of progression. Electronically Signed   By: Monte Fantasia M.D.   On: 03/17/2017 14:25   Mr Brain Wo Contrast  Result Date: 03/15/2017 CLINICAL DATA:  76 year old female with with evidence of new cerebellar infarcts from earlier this month on head CT today performed for abnormal left upper extremity movements and near syncope. EXAM: MRI HEAD WITHOUT CONTRAST MRA HEAD WITHOUT CONTRAST TECHNIQUE: Multiplanar, multiecho pulse sequences of the brain and surrounding structures were obtained without intravenous contrast. Angiographic images of the head were obtained using MRA technique without contrast. COMPARISON:  Head CT 0805 hours today.  Head CT 02/27/2017. FINDINGS: MRI HEAD FINDINGS Brain: Patchy restricted diffusion in both cerebellar hemispheres (series 6, image 12). No brainstem involvement. No deep gray matter nuclei involvement, but there are also several scattered small foci of restricted diffusion in the left occipital lobe. Associated T2 and FLAIR hyperintensity. No associated hemorrhage or mass effect. No anterior circulation restricted diffusion. No midline shift, mass effect, evidence of mass lesion, ventriculomegaly, extra-axial collection or acute intracranial hemorrhage. Cervicomedullary junction and pituitary are within normal limits. Patchy bilateral cerebral white matter T2 and FLAIR hyperintensity. No chronic cortical encephalomalacia or chronic cerebral blood products are identified. Signal in the deep gray matter nuclei and brainstem is normal for age. Vascular: Major intracranial vascular flow voids are preserved, the distal left vertebral artery appears dominant and is somewhat dolichoectatic. There is mild generalized intracranial artery tortuosity. Skull and upper cervical spine: Negative.  Sinuses/Orbits: Normal orbits soft tissues. Visualized paranasal sinuses and mastoids are stable and well pneumatized. Other: Visible internal auditory structures appear normal. Negative scalp soft tissues. MRA HEAD FINDINGS Antegrade flow in the dominant distal left vertebral artery. No distal left  vertebral artery stenosis. Absent flow in the non dominant appearing right vertebral artery V4 segment, although there is faint flow signal in the distal right vertebral artery at the upper cervical spine. The basilar artery is patent, but there is severe proximal basilar stenosis, corresponding to an area of prominent calcified plaque on the CT head today (series 755, image 10). This is proximal to a dominant appearing right AICA origin. The distal basilar is irregular without additional stenosis. SCA and left PCA origins are normal. There is a fetal type right PCA origin. The left posterior communicating artery is diminutive or absent. There is mild irregularity and attenuated distal flow in the left PCA. The distal right PCA appears normal. Antegrade flow in both ICA siphons. Evidence of a tortuous retropharyngeal course of 1 or board carotid arteries in the neck. No ICA siphon stenosis. Normal ophthalmic artery origins. Patent carotid termini. Normal MCA and ACA origins. Dominant left A1 segment. Anterior communicating artery and visible ACA branches are within normal limits. Visible bilateral MCA branches are within normal limits. IMPRESSION: 1. 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 No associated hemorrhage or mass effect. 2. 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 3. 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 4. Attenuated flow in the distal left PCA. 5. Mild to moderate for age nonspecific cerebral white matter signal changes. 6. Negative anterior circulation. 7. Salient  findings discussed by telephone with Dr. Jeanene Erb on 03/15/2017 at 20:33 . Electronically Signed   By: Genevie Ann M.D.   On: 03/15/2017 20:33   Mr Jodene Nam Head/brain QM Cm  Result Date: 03/15/2017 CLINICAL DATA:  76 year old female with with evidence of new cerebellar infarcts from earlier this month on head CT today performed for abnormal left upper extremity movements and near syncope. EXAM: MRI HEAD WITHOUT CONTRAST MRA HEAD WITHOUT CONTRAST TECHNIQUE: Multiplanar, multiecho pulse sequences of the brain and surrounding structures were obtained without intravenous contrast. Angiographic images of the head were obtained using MRA technique without contrast. COMPARISON:  Head CT 0805 hours today.  Head CT 02/27/2017. FINDINGS: MRI HEAD FINDINGS Brain: Patchy restricted diffusion in both cerebellar hemispheres (series 6, image 12). No brainstem involvement. No deep gray matter nuclei involvement, but there are also several scattered small foci of restricted diffusion in the left occipital lobe. Associated T2 and FLAIR hyperintensity. No associated hemorrhage or mass effect. No anterior circulation restricted diffusion. No midline shift, mass effect, evidence of mass lesion, ventriculomegaly, extra-axial collection or acute intracranial hemorrhage. Cervicomedullary junction and pituitary are within normal limits. Patchy bilateral cerebral white matter T2 and FLAIR hyperintensity. No chronic cortical encephalomalacia or chronic cerebral blood products are identified. Signal in the deep gray matter nuclei and brainstem is normal for age. Vascular: Major intracranial vascular flow voids are preserved, the distal left vertebral artery appears dominant and is somewhat dolichoectatic. There is mild generalized intracranial artery tortuosity. Skull and upper cervical spine: Negative. Sinuses/Orbits: Normal orbits soft tissues. Visualized paranasal sinuses and mastoids are stable and well pneumatized. Other: Visible internal  auditory structures appear normal. Negative scalp soft tissues. MRA HEAD FINDINGS Antegrade flow in the dominant distal left vertebral artery. No distal left vertebral artery stenosis. Absent flow in the non dominant appearing right vertebral artery V4 segment, although there is faint flow signal in the distal right vertebral artery at the upper cervical spine. The basilar artery is patent, but there  is severe proximal basilar stenosis, corresponding to an area of prominent calcified plaque on the CT head today (series 755, image 10). This is proximal to a dominant appearing right AICA origin. The distal basilar is irregular without additional stenosis. SCA and left PCA origins are normal. There is a fetal type right PCA origin. The left posterior communicating artery is diminutive or absent. There is mild irregularity and attenuated distal flow in the left PCA. The distal right PCA appears normal. Antegrade flow in both ICA siphons. Evidence of a tortuous retropharyngeal course of 1 or board carotid arteries in the neck. No ICA siphon stenosis. Normal ophthalmic artery origins. Patent carotid termini. Normal MCA and ACA origins. Dominant left A1 segment. Anterior communicating artery and visible ACA branches are within normal limits. Visible bilateral MCA branches are within normal limits. IMPRESSION: 1. 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 No associated hemorrhage or mass effect. 2. 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 3. 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 4. Attenuated flow in the distal left PCA. 5. Mild to moderate for age nonspecific cerebral white matter signal changes. 6. Negative anterior circulation. 7. Salient findings discussed by telephone with Dr. Jeanene Erb on 03/15/2017 at 20:33 . Electronically Signed   By: Genevie Ann M.D.   On: 03/15/2017 20:33      Time Spent in minutes  30   Jani Gravel M.D on 03/21/2017 at 7:59 AM  Between 7am to 7pm - Pager - [phone]  After 7pm go to [email or web address] - password [facility]  [facility] -  Office  [phone]

## 2017-03-22 LAB — COMPREHENSIVE METABOLIC PANEL
ALBUMIN: 3.2 g/dL — AB (ref 3.5–5.0)
ALK PHOS: 39 U/L (ref 38–126)
ALT: 26 U/L (ref 14–54)
ANION GAP: 6 (ref 5–15)
AST: 34 U/L (ref 15–41)
BILIRUBIN TOTAL: 0.9 mg/dL (ref 0.3–1.2)
BUN: 9 mg/dL (ref 6–20)
CALCIUM: 8.7 mg/dL — AB (ref 8.9–10.3)
CO2: 27 mmol/L (ref 22–32)
Chloride: 104 mmol/L (ref 101–111)
Creatinine, Ser: 0.9 mg/dL (ref 0.44–1.00)
GFR calc Af Amer: >60 mL/min (ref >60)
GFR calc non Af Amer: >60 mL/min (ref >60)
GLUCOSE: 108 mg/dL — AB (ref 65–99)
Potassium: 4.7 mmol/L (ref 3.5–5.1)
Sodium: 137 mmol/L (ref 135–145)
TOTAL PROTEIN: 5.9 g/dL — AB (ref 6.5–8.1)

## 2017-03-22 LAB — CBC
HEMATOCRIT: 37.8 % (ref 36.0–46.0)
HEMOGLOBIN: 12.6 g/dL (ref 12.0–15.0)
MCH: 31 pg (ref 26.0–34.0)
MCHC: 33.3 g/dL (ref 30.0–36.0)
MCV: 93.1 fL (ref 78.0–100.0)
Platelets: 222 10*3/uL (ref 150–400)
RBC: 4.06 MIL/uL (ref 3.87–5.11)
RDW: 13.3 % (ref 11.5–15.5)
WBC: 8 10*3/uL (ref 4.0–10.5)

## 2017-03-22 MED ORDER — LEVALBUTEROL TARTRATE 45 MCG/ACT IN AERO: 1.0000 | INHALATION_SPRAY | Freq: Three times a day (TID) | RESPIRATORY_TRACT | Status: DC | PRN

## 2017-03-22 MED ORDER — ALBUTEROL SULFATE (2.5 MG/3ML) 0.083% IN NEBU: 3.0000 mL | INHALATION_SOLUTION | RESPIRATORY_TRACT | Status: DC | PRN

## 2017-03-22 MED ORDER — SALINE SPRAY 0.65 % NA SOLN
1.0000 | NASAL | Status: DC | PRN
  Administered 2017-03-24: 1 via NASAL
  Filled 2017-03-22 (×2): qty 44

## 2017-03-22 MED ORDER — LEVALBUTEROL HCL 0.63 MG/3ML IN NEBU
0.6300 mg | INHALATION_SOLUTION | Freq: Three times a day (TID) | RESPIRATORY_TRACT | Status: DC | PRN
  Administered 2017-03-22: 0.63 mg via RESPIRATORY_TRACT
  Filled 2017-03-22: qty 3

## 2017-03-22 NOTE — Progress Notes (Signed)
   03/22/17 1625  Clinical Encounter Type  Visited With Patient  Visit Type Follow-up  Spiritual Encounters  Spiritual Needs Emotional  Stress Factors  Patient Stress Factors Health changes  Checked in a couple times today with Pt. She was undergoing procedures and stated feeling tired. Follow up as requested.

## 2017-03-22 NOTE — Progress Notes (Signed)
Pt states she has had some intermittent numbness in the left leg and both feet.. She states that it does not effect the feeling when we touch.  She just wanted to let us know that she has had those symptoms in the past and is having some now and wanted to let us know.

## 2017-03-22 NOTE — Progress Notes (Signed)
Patient ID: Kathleen Dunn, female   DOB: Jun 08, 1941, 76 y.o.   MRN: [id number]                                                                PROGRESS NOTE                                                                                                                                                                                                             Patient Demographics:    Kathleen Dunn, is a 76 y.o. female, DOB - [birth year], AUQ:[id number]  Admit date - 03/15/2017   Admitting Physician Truett Mainland, DO  Outpatient Primary MD for the patient is Hali Marry, MD  LOS - 6  Outpatient Specialists:    Chief Complaint  Patient presents with  . Near Syncope       Brief Narrative    Kathleen Saundersis a 76 y.o.femalewith a history of asthma, hypertension, thyroid disease, anxiety, on Desogen therapy for hormone replacement. Patient was seen at [facility] for near syncope on June 14 and received a head CT which showed no acute intracranial abnormality but evidence of an old small left cerebellar infarct with small vessel ischemic changes in the white matter. Over the past 2 days the patient has had intermittent shaking and "twisting" of her left hand which last for several minutes. She has been feeling lightheaded over the past few days and took Dramamine twice, which preceded the episodes of shaking and twisting. She denies vertigo, chest pain, weakness, sensory changes. Her CT scan today showed worsening areas of low density in the cerebellum consistent with small acute/subacute cerebellar infarctions.  Interim history  Found to have Acute CVA on MRI. Neuro consulted. Plan for cerebral angiogram.    Subjective:    Kathleen Dunn today states that she is doing well, awaiting angiogram/intevention hopefully Monday or Tuesday.    No headache, No chest pain, No abdominal pain - No Nausea, No new weakness tingling or numbness, No Cough - SOB.    Assessment  & Plan :      Principal Problem:   Cerebellar stroke (HCC) Active Problems:   Hypothyroidism   HYPERCHOLESTEROLEMIA   HYPERTENSION, BENIGN SYSTEMIC   GAD (generalized anxiety disorder)   Mixed hyperlipidemia   Basilar artery stenosis   Bilateral  carotid artery stenosis   Cerebrovascular accident (CVA) due to thrombosis of precerebral artery (HCC)   Acute CVA/Cerebellar stroke -Presented with near syncope, complains of visual changes -Patient currently having word/phrase repetition -CT head: Worsening of areas of low density in the cerebellum consistent with small acute/subacute cerebellar infarctions -MRI brain: Scattered acute infarcts in both cerebellar hemispheres and left PCA territory -MRA head: Age-indeterminate occlusion of the nondominant distal right vertebral artery, severe proximal basilar artery stenosis -Echocardiogram: EF 30-16%, grade 1 diastolic dysfunction. No RWMA -Hemoglobin A1c 5.8 -LDL unable to calculate -PT and speech consulted and patient has no further needs -pending speech and OT consults (patient does live alone) -Continue aspirin, statin, plavix -Neurology consulted and appreciated -Interventional radiology consulted for cerebral angiogram- pending.  -Patient is hesitant and wanting more information before having additional testing down. Explained to patient that she should discuss this with her family and neurology.  -EEG: normal  Cerebral angiogram 7/5, awaiting repeat Angiogram Monday ? Appreciate IR and neurology input  Essential hypertension -Continue metoprolol, will hold triamterene/HCTZ  Hyperlipidemia -Lipid panel: Total cholesterol 272, HDL 30, LDL unable to calculate, triglycerides 783 -Currently on statin, however switched to Crestor 40mg  daily and Zetia added Add vascepa (not on formulary, will add at discharge)  Hypothyroidism -Continue Synthroid  Anxiety -d/c lorazepam. Continue Klonopin as needed  Knee pain voltaren gel 2gm  topically qid prn   DVT ProphylaxisLovenox  Code Status:Full  Family Communication:None at bedside  Disposition Plan:Admitted. Pending further workup and evaluations. Dispo TBD  Consultants Neurology Interventional radiology   Procedures  Echocardiogram 7/1=>EF 60-65%, trivial pericardial effusion, grade 1 diastolic dysfunction MRI brain 6/30=> IMPRESSION: 1. 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 No associated hemorrhage or mass effect. 2. 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 3. 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 4. Attenuated flow in the distal left PCA. 5. Mild to moderate for age nonspecific cerebral white matter signal changes. 6. Negative anterior circulation.  CTA head/neck 7/2=> IMPRESSION: 1. Advanced atherosclerosis in the head and neck. 2. Short segment atheromatous occlusion or critical stenosis in the proximal basilar. 3. Moderate atheromatous narrowing in the distal left V4 segment and mid basilar. The hypoplastic right vertebral artery that is effectively occluded at the dura. 4. 60-70% proximal right ICA and 40-50% right common carotid stenosis due to atherosclerotic plaque. 5. Known acute infarcts in the bilateral cerebellum and left occipital lobe (there is a fetal type right PCA). No hemorrhagic conversion or evidence of progression.    Lab Results  Component Value Date   PLT 222 03/22/2017     Anti-infectives    None        Objective:   Vitals:   03/21/17 0623 03/21/17 1329 03/21/17 2119 03/22/17 0523  BP: (!) 146/84 (!) 160/74 (!) 155/76 136/84  Pulse: 76 72 83 75  Resp: 18 20 18 18   Temp: (!) 97.5 F (36.4 C) 98.4 F (36.9 C) 97.6 F (36.4 C) 98.2 F (36.8 C)  TempSrc: Oral Oral  Oral  SpO2: 100% 100% 98% 96%  Weight:      Height:        Wt Readings  from Last 3 Encounters:  03/15/17 86.2 kg (190 lb)  02/27/17 86.2 kg (190 lb)  11/03/15 82.6 kg (182 lb)     Intake/Output Summary (Last 24 hours) at 03/22/17 0819 Last data filed at 03/22/17 [id number]  Gross per 24 hour  Intake              600 ml  Output                0 ml  Net              600 ml     Physical Exam  Awake Alert, Oriented X 3, No new F.N deficits, Normal affect .AT,PERRAL Supple Neck,No JVD, No cervical lymphadenopathy appriciated.  Symmetrical Chest wall movement, Good air movement bilaterally, CTAB RRR,No Gallops,Rubs or new Murmurs, No Parasternal Heave +ve B.Sounds, Abd Soft, No tenderness, No organomegaly appriciated, No rebound - guarding or rigidity. No Cyanosis, Clubbing or edema, No new Rash or bruise   Motor 5/5 in all 4 ext    Data Review:    CBC  Recent Labs Lab 03/17/17 0630 03/20/17 0823 03/21/17 1352 03/22/17 0335  WBC 7.3 6.1 6.9 8.0  HGB 13.5 12.3 13.0 12.6  HCT 40.9 36.6 38.6 37.8  PLT 232 219 242 222  MCV 93.2 94.1 94.1 93.1  MCH 30.8 31.6 31.7 31.0  MCHC 33.0 33.6 33.7 33.3  RDW 13.3 13.4 13.3 13.3    Chemistries   Recent Labs Lab 03/17/17 0630 03/20/17 0823 03/21/17 1352 03/22/17 0335  NA 136 139 138 137  K 4.2 3.8 4.3 4.7  CL 104 109 105 104  CO2 24 24 25 27   GLUCOSE 98 92 122* 108*  BUN 16 7 8 9   CREATININE 0.92 0.87 0.89 0.90  CALCIUM 8.6* 8.1* 8.5* 8.7*  AST  --  19 39 34  ALT  --  17 27 26   ALKPHOS  --  35* 43 39  BILITOT  --  0.6 1.0 0.9   ------------------------------------------------------------------------------------------------------------------ No results for input(s): CHOL, HDL, LDLCALC, TRIG, CHOLHDL, LDLDIRECT in the last 72 hours.  Lab Results  Component Value Date   HGBA1C 5.8 (H) 03/16/2017   ------------------------------------------------------------------------------------------------------------------ No results for input(s): TSH, T4TOTAL, T3FREE, THYROIDAB in the last 72  hours.  Invalid input(s): FREET3 ------------------------------------------------------------------------------------------------------------------ No results for input(s): VITAMINB12, FOLATE, FERRITIN, TIBC, IRON, RETICCTPCT in the last 72 hours.  Coagulation profile  Recent Labs Lab 03/17/17 0630 03/20/17 0823  INR 1.09 1.03    No results for input(s): DDIMER in the last 72 hours.  Cardiac Enzymes No results for input(s): CKMB, TROPONINI, MYOGLOBIN in the last 168 hours.  Invalid input(s): CK ------------------------------------------------------------------------------------------------------------------ No results found for: BNP  Inpatient Medications  Scheduled Meds: .  stroke: mapping our early stages of recovery book   Does not apply Once  . aspirin EC  325 mg Oral Daily  . budesonide (PULMICORT) nebulizer solution  0.25 mg Nebulization BID  . clonazePAM  1 mg Oral QHS  . clopidogrel  75 mg Oral Daily  . enoxaparin (LOVENOX) injection  40 mg Subcutaneous Q24H  . ezetimibe  10 mg Oral Daily  . famotidine  20 mg Oral BID  . levothyroxine  50 mcg Oral QAC breakfast  . losartan  50 mg Oral Daily  . rosuvastatin  40 mg Oral q1800   Continuous Infusions: . sodium chloride 50 mL/hr at 03/21/17 0548   PRN Meds:.acetaminophen **OR** acetaminophen (TYLENOL) oral liquid 160 mg/5 mL **OR** acetaminophen, acetic acid-hydrocortisone, benzonatate, clonazePAM, diclofenac sodium, loperamide, ondansetron (ZOFRAN) IV, senna-docusate  Micro Results No results found for this or any previous visit (from the past 240 hour(s)).  Radiology Reports Ct Angio Head W Or Wo Contrast  Result Date: 03/17/2017 CLINICAL DATA:  Stroke follow-up. EXAM: CT ANGIOGRAPHY HEAD AND NECK TECHNIQUE: Multidetector CT imaging of the head and neck was performed using the standard protocol during bolus administration of intravenous contrast. Multiplanar CT image reconstructions and MIPs were obtained to  evaluate the vascular anatomy. Carotid stenosis measurements (when applicable) are obtained utilizing NASCET criteria, using the distal internal carotid diameter as the denominator. CONTRAST:  50 cc Isovue 350 intravenous COMPARISON:  Brain MRI 03/15/2017 FINDINGS: CT HEAD FINDINGS Brain: Patchy acute to subacute infarcts in the bilateral cerebrum and parasagittal left occipital lobe as seen on previous brain MRI. No evidence of infarct progression. No hemorrhagic conversion. No hydrocephalus. Chronic microvascular ischemia in the cerebral white matter. Vascular: See below Skull: No acute or aggressive finding Sinuses: Negative Orbits: Negative Review of the MIP images confirms the above findings CTA NECK FINDINGS Aortic arch: Atherosclerosis without aneurysm or acute finding. Three vessel branching. Right carotid system: Atheromatous plaque at the common carotid origin without stenosis. There is prominent noncalcified plaque on the common carotid distally; stenosis measures 40-50%. Moderate calcified plaque at the common carotid bifurcation with stenosis measuring up to 60%. No superimposed ulceration. ICA tortuosity with vessel reaching the midline retropharynx. Left carotid system: Comparatively mild calcified and noncalcified plaque at the common carotid bifurcation and ICA bulb without stenosis or ulceration. Prominent ICA tortuosity reaching the midline retropharynx. Vertebral arteries: Left dominant vertebral artery that is widely patent to the dura. Thready flow in the diminutive right vertebral artery. Skeleton: No acute or aggressive finding. Other neck: No evidence of incidental mass or inflammation. Upper chest: Calcified granuloma in the left upper lobe. Review of the MIP images confirms the above findings CTA HEAD FINDINGS Anterior circulation: Calcified plaquing in the carotid siphons without flow limiting stenosis. Hypoplastic right A1 segment. No major branch occlusion is seen. There is atheromatous  irregularity of bilateral MCA vessels without flow limiting and reversible stenosis. Posterior circulation: No visible flow within the hypoplastic distal right V4 segment. 2 left vertebral stenoses, mild proximally and moderate distally. At the anticipated level of proximal basilar artery there is severe short segment stenosis or occlusion. The mid basilar shows a more moderate narrowing. The right P1 segment is aplastic with fetal type circulation. There is atheromatous irregularity of the left more than right PCA with up to moderate left P2 segment stenoses Venous sinuses: Patent Anatomic variants: Fetal type right PCA Delayed phase: No abnormal intracranial enhancement. Review of the MIP images confirms the above findings IMPRESSION: 1. Advanced atherosclerosis in the head and neck. 2. Short segment atheromatous occlusion or critical stenosis in the proximal basilar. 3. Moderate atheromatous narrowing in the distal left V4 segment and mid basilar. The hypoplastic right vertebral artery that is effectively occluded at the dura. 4. 60-70% proximal right ICA and 40-50% right common carotid stenosis due to atherosclerotic plaque. 5. Known acute infarcts in the bilateral cerebellum and left occipital lobe (there is a fetal type right PCA). No hemorrhagic conversion or evidence of progression. Electronically Signed   By: Monte Fantasia M.D.   On: 03/17/2017 14:25   Dg Lumbar Spine Complete  Result Date: 03/15/2017 CLINICAL DATA:  Pt complains of lower back pain x 5 days with fall, near syncopal episode and dizziness x 2 days EXAM: LUMBAR SPINE - COMPLETE 4+ VIEW COMPARISON:  None. FINDINGS: No fracture.  No spondylolisthesis. Moderate to marked loss of disc height at L1-L2 with endplate sclerosis and osteophytes. Mild loss disc height at T12-L1 and L3-L4. There facet degenerative changes most evident  in the lower lumbar spine. Dense aortic vascular calcifications are noted. No evidence of an aneurysm. There are  other scattered vascular calcifications. Soft tissues otherwise unremarkable. IMPRESSION: 1. No fracture or acute finding. 2. Degenerative changes as detailed. Electronically Signed   By: Lajean Manes M.D.   On: 03/15/2017 08:26   Ct Head Wo Contrast  Result Date: 03/15/2017 CLINICAL DATA:  Near syncope.  Left arm shaking. EXAM: CT HEAD WITHOUT CONTRAST TECHNIQUE: Contiguous axial images were obtained from the base of the skull through the vertex without intravenous contrast. COMPARISON:  02/27/2017 FINDINGS: Brain: Worsening of areas of low-density in the cerebellar hemispheres, more notable on the right, consistent with subacute cerebellar infarctions. No sign of hemorrhage or swelling. Chronic small-vessel ischemic changes affect the pons. Cerebral hemispheres show chronic small-vessel ischemic changes of the white matter. No cortical supratentorial infarction identified. No mass lesion, hemorrhage, hydrocephalus or extra-axial collection. Vascular: There is atherosclerotic calcification of the major vessels at the base of the brain. Skull: Negative Sinuses/Orbits: Clear/normal Other: None significant IMPRESSION: Worsening of areas of low-density in the cerebellum consistent with small acute/ subacute cerebellar infarctions. No sign of hemorrhage or mass effect. Electronically Signed   By: Nelson Chimes M.D.   On: 03/15/2017 08:11   Ct Head Wo Contrast  Result Date: 02/27/2017 CLINICAL DATA:  Syncope, congestion and flu like symptoms EXAM: CT HEAD WITHOUT CONTRAST TECHNIQUE: Contiguous axial images were obtained from the base of the skull through the vertex without intravenous contrast. COMPARISON:  None. FINDINGS: Brain: No acute territorial infarction, hemorrhage or intracranial mass is seen. Probable old small infarct in the left cerebellum. Mild periventricular small vessel ischemic changes of the white matter. Mild atrophy. Ventricles size within normal limits. Vascular: No hyperdense vessels.  Carotid artery calcification. Vertebral artery calcification. Skull: No fracture or suspicious bone lesion. Sinuses/Orbits: Mucosal thickening in the ethmoid sinuses. No acute orbital abnormality. Other: Small scalp swelling right posterior vertex. IMPRESSION: 1. No definite CT evidence for acute intracranial abnormality. 2. Suspect old small left cerebellar infarct. Mild small vessel ischemic changes of the white matter 3. Small right posterior scalp swelling Electronically Signed   By: Donavan Foil M.D.   On: 02/27/2017 19:37   Ct Angio Neck W Or Wo Contrast  Result Date: 03/17/2017 CLINICAL DATA:  Stroke follow-up. EXAM: CT ANGIOGRAPHY HEAD AND NECK TECHNIQUE: Multidetector CT imaging of the head and neck was performed using the standard protocol during bolus administration of intravenous contrast. Multiplanar CT image reconstructions and MIPs were obtained to evaluate the vascular anatomy. Carotid stenosis measurements (when applicable) are obtained utilizing NASCET criteria, using the distal internal carotid diameter as the denominator. CONTRAST:  50 cc Isovue 350 intravenous COMPARISON:  Brain MRI 03/15/2017 FINDINGS: CT HEAD FINDINGS Brain: Patchy acute to subacute infarcts in the bilateral cerebrum and parasagittal left occipital lobe as seen on previous brain MRI. No evidence of infarct progression. No hemorrhagic conversion. No hydrocephalus. Chronic microvascular ischemia in the cerebral white matter. Vascular: See below Skull: No acute or aggressive finding Sinuses: Negative Orbits: Negative Review of the MIP images confirms the above findings CTA NECK FINDINGS Aortic arch: Atherosclerosis without aneurysm or acute finding. Three vessel branching. Right carotid system: Atheromatous plaque at the common carotid origin without stenosis. There is prominent noncalcified plaque on the common carotid distally; stenosis measures 40-50%. Moderate calcified plaque at the common carotid bifurcation with  stenosis measuring up to 60%. No superimposed ulceration. ICA tortuosity with vessel reaching the midline retropharynx. Left carotid  system: Comparatively mild calcified and noncalcified plaque at the common carotid bifurcation and ICA bulb without stenosis or ulceration. Prominent ICA tortuosity reaching the midline retropharynx. Vertebral arteries: Left dominant vertebral artery that is widely patent to the dura. Thready flow in the diminutive right vertebral artery. Skeleton: No acute or aggressive finding. Other neck: No evidence of incidental mass or inflammation. Upper chest: Calcified granuloma in the left upper lobe. Review of the MIP images confirms the above findings CTA HEAD FINDINGS Anterior circulation: Calcified plaquing in the carotid siphons without flow limiting stenosis. Hypoplastic right A1 segment. No major branch occlusion is seen. There is atheromatous irregularity of bilateral MCA vessels without flow limiting and reversible stenosis. Posterior circulation: No visible flow within the hypoplastic distal right V4 segment. 2 left vertebral stenoses, mild proximally and moderate distally. At the anticipated level of proximal basilar artery there is severe short segment stenosis or occlusion. The mid basilar shows a more moderate narrowing. The right P1 segment is aplastic with fetal type circulation. There is atheromatous irregularity of the left more than right PCA with up to moderate left P2 segment stenoses Venous sinuses: Patent Anatomic variants: Fetal type right PCA Delayed phase: No abnormal intracranial enhancement. Review of the MIP images confirms the above findings IMPRESSION: 1. Advanced atherosclerosis in the head and neck. 2. Short segment atheromatous occlusion or critical stenosis in the proximal basilar. 3. Moderate atheromatous narrowing in the distal left V4 segment and mid basilar. The hypoplastic right vertebral artery that is effectively occluded at the dura. 4. 60-70%  proximal right ICA and 40-50% right common carotid stenosis due to atherosclerotic plaque. 5. Known acute infarcts in the bilateral cerebellum and left occipital lobe (there is a fetal type right PCA). No hemorrhagic conversion or evidence of progression. Electronically Signed   By: Monte Fantasia M.D.   On: 03/17/2017 14:25   Mr Brain Wo Contrast  Result Date: 03/15/2017 CLINICAL DATA:  76 year old female with with evidence of new cerebellar infarcts from earlier this month on head CT today performed for abnormal left upper extremity movements and near syncope. EXAM: MRI HEAD WITHOUT CONTRAST MRA HEAD WITHOUT CONTRAST TECHNIQUE: Multiplanar, multiecho pulse sequences of the brain and surrounding structures were obtained without intravenous contrast. Angiographic images of the head were obtained using MRA technique without contrast. COMPARISON:  Head CT 0805 hours today.  Head CT 02/27/2017. FINDINGS: MRI HEAD FINDINGS Brain: Patchy restricted diffusion in both cerebellar hemispheres (series 6, image 12). No brainstem involvement. No deep gray matter nuclei involvement, but there are also several scattered small foci of restricted diffusion in the left occipital lobe. Associated T2 and FLAIR hyperintensity. No associated hemorrhage or mass effect. No anterior circulation restricted diffusion. No midline shift, mass effect, evidence of mass lesion, ventriculomegaly, extra-axial collection or acute intracranial hemorrhage. Cervicomedullary junction and pituitary are within normal limits. Patchy bilateral cerebral white matter T2 and FLAIR hyperintensity. No chronic cortical encephalomalacia or chronic cerebral blood products are identified. Signal in the deep gray matter nuclei and brainstem is normal for age. Vascular: Major intracranial vascular flow voids are preserved, the distal left vertebral artery appears dominant and is somewhat dolichoectatic. There is mild generalized intracranial artery tortuosity.  Skull and upper cervical spine: Negative. Sinuses/Orbits: Normal orbits soft tissues. Visualized paranasal sinuses and mastoids are stable and well pneumatized. Other: Visible internal auditory structures appear normal. Negative scalp soft tissues. MRA HEAD FINDINGS Antegrade flow in the dominant distal left vertebral artery. No distal left vertebral artery stenosis. Absent flow  in the non dominant appearing right vertebral artery V4 segment, although there is faint flow signal in the distal right vertebral artery at the upper cervical spine. The basilar artery is patent, but there is severe proximal basilar stenosis, corresponding to an area of prominent calcified plaque on the CT head today (series 755, image 10). This is proximal to a dominant appearing right AICA origin. The distal basilar is irregular without additional stenosis. SCA and left PCA origins are normal. There is a fetal type right PCA origin. The left posterior communicating artery is diminutive or absent. There is mild irregularity and attenuated distal flow in the left PCA. The distal right PCA appears normal. Antegrade flow in both ICA siphons. Evidence of a tortuous retropharyngeal course of 1 or board carotid arteries in the neck. No ICA siphon stenosis. Normal ophthalmic artery origins. Patent carotid termini. Normal MCA and ACA origins. Dominant left A1 segment. Anterior communicating artery and visible ACA branches are within normal limits. Visible bilateral MCA branches are within normal limits. IMPRESSION: 1. 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 No associated hemorrhage or mass effect. 2. 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 3. 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 4. Attenuated flow in the distal left PCA. 5. Mild to moderate for age nonspecific cerebral white matter signal changes. 6.  Negative anterior circulation. 7. Salient findings discussed by telephone with Dr. Jeanene Erb on 03/15/2017 at 20:33 . Electronically Signed   By: Genevie Ann M.D.   On: 03/15/2017 20:33   Mr Jodene Nam Head/brain YH Cm  Result Date: 03/15/2017 CLINICAL DATA:  76 year old female with with evidence of new cerebellar infarcts from earlier this month on head CT today performed for abnormal left upper extremity movements and near syncope. EXAM: MRI HEAD WITHOUT CONTRAST MRA HEAD WITHOUT CONTRAST TECHNIQUE: Multiplanar, multiecho pulse sequences of the brain and surrounding structures were obtained without intravenous contrast. Angiographic images of the head were obtained using MRA technique without contrast. COMPARISON:  Head CT 0805 hours today.  Head CT 02/27/2017. FINDINGS: MRI HEAD FINDINGS Brain: Patchy restricted diffusion in both cerebellar hemispheres (series 6, image 12). No brainstem involvement. No deep gray matter nuclei involvement, but there are also several scattered small foci of restricted diffusion in the left occipital lobe. Associated T2 and FLAIR hyperintensity. No associated hemorrhage or mass effect. No anterior circulation restricted diffusion. No midline shift, mass effect, evidence of mass lesion, ventriculomegaly, extra-axial collection or acute intracranial hemorrhage. Cervicomedullary junction and pituitary are within normal limits. Patchy bilateral cerebral white matter T2 and FLAIR hyperintensity. No chronic cortical encephalomalacia or chronic cerebral blood products are identified. Signal in the deep gray matter nuclei and brainstem is normal for age. Vascular: Major intracranial vascular flow voids are preserved, the distal left vertebral artery appears dominant and is somewhat dolichoectatic. There is mild generalized intracranial artery tortuosity. Skull and upper cervical spine: Negative. Sinuses/Orbits: Normal orbits soft tissues. Visualized paranasal sinuses and mastoids are stable and  well pneumatized. Other: Visible internal auditory structures appear normal. Negative scalp soft tissues. MRA HEAD FINDINGS Antegrade flow in the dominant distal left vertebral artery. No distal left vertebral artery stenosis. Absent flow in the non dominant appearing right vertebral artery V4 segment, although there is faint flow signal in the distal right vertebral artery at the upper cervical spine. The basilar artery is patent, but there is severe proximal basilar stenosis,  corresponding to an area of prominent calcified plaque on the CT head today (series 755, image 10). This is proximal to a dominant appearing right AICA origin. The distal basilar is irregular without additional stenosis. SCA and left PCA origins are normal. There is a fetal type right PCA origin. The left posterior communicating artery is diminutive or absent. There is mild irregularity and attenuated distal flow in the left PCA. The distal right PCA appears normal. Antegrade flow in both ICA siphons. Evidence of a tortuous retropharyngeal course of 1 or board carotid arteries in the neck. No ICA siphon stenosis. Normal ophthalmic artery origins. Patent carotid termini. Normal MCA and ACA origins. Dominant left A1 segment. Anterior communicating artery and visible ACA branches are within normal limits. Visible bilateral MCA branches are within normal limits. IMPRESSION: 1. 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 No associated hemorrhage or mass effect. 2. 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 3. 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 4. Attenuated flow in the distal left PCA. 5. Mild to moderate for age nonspecific cerebral white matter signal changes. 6. Negative anterior circulation. 7. Salient findings discussed by telephone with Dr. Jeanene Erb on 03/15/2017 at 20:33 . Electronically Signed    By: Genevie Ann M.D.   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 EXAM: BILATERAL COMMON CAROTID ARTERY AND BILATERAL VERTEBRAL ARTERY ANGIOGRAMS COMPARISON:  CT angiogram of the head and neck of 03/17/2017, and MRI of the brain and MRA of the brain of 03/15/2017. MEDICATIONS: No antibiotic was administered within 1 hour of the procedure. ANESTHESIA/SEDATION: Versed 1 mg IV; Fentanyl 25 mcg IV Moderate Sedation Time:  25 minutes. The patient was continuously monitored during the procedure by the interventional radiology nurse under my direct supervision. CONTRAST:  Isovue 300 approximately 60 mL. FLUOROSCOPY TIME:  Fluoroscopy Time: 7 minutes 18 seconds (959 mGy). COMPLICATIONS: None immediate. TECHNIQUE: Informed written consent was obtained from the patient after a thorough discussion of the procedural risks, benefits and alternatives. All questions were addressed. Maximal Sterile Barrier Technique was utilized including caps, mask, sterile gowns, sterile gloves, sterile drape, hand hygiene and skin antiseptic. A timeout was performed prior to the initiation of the procedure. The right groin was prepped and draped in the usual sterile fashion. Thereafter using modified Seldinger technique, transfemoral access into the right common femoral artery was obtained without difficulty. Over a 0.035 inch guidewire a 5 French Pinnacle sheath was inserted. Through this, and also over a 0.035 inch guidewire a 5 [place] JB 1 catheter was advanced to the aortic arch region and selectively positioned in the right subclavian artery, the right common carotid artery and left common carotid artery and left vertebral artery. There were no acute complications. The patient tolerated the procedure well. FINDINGS: The right subclavian arteriogram demonstrates a hypoplastic right vertebral  artery origin to be normal. The vessel is seen to ascend to the cranial skull base where it supplies the right posterior-inferior cerebellar artery. Contrast is seen extending into the right vertebrobasilar junction with nonvisualization of the basilar artery. The right common carotid arteriogram demonstrates moderate tortuosity of the distal right common carotid artery. There is a 50% stenosis at the origin of the right external carotid artery. Its branches, otherwise, opacify normally. The right internal  carotid artery at the bulb demonstrates a 50% stenosis as well secondary to a concentric atherosclerotic plaque. Distal to this there is a double U-shaped tortuosity involving the proximal and the middle one thirds of the right internal carotid artery. No kinking is noted. Distal to this the vessel is seen to opacify normally to the cranial skull base. The petrous, the cavernous and the supraclinoid segments demonstrate wide patency. A dominant right posterior communicating artery is seen opacifying the right posterior cerebral artery distribution. The right middle cerebral artery is seen to opacify normally into the capillary and venous phases. The right anterior cerebral artery proximally demonstrates approximately 50% stenoses. However, opacification is noted distal to this to the right A1 A2 junction and subsequently the right anterior cerebral artery distribution into the capillary and venous phases. The left common carotid arteriogram demonstrates the left external carotid artery to be mildly narrowed at its origin. Its branches are normally opacified. The left internal carotid artery at the bulb demonstrates wide patency proximally. There again is a double U-shaped configuration of the junction of the proximal 1/3 and middle 1/3 of the left internal carotid artery. No associated kinking is seen. Distal to this the left internal carotid artery is seen to opacify normally to the cranial skull base. The  petrous, cavernous and supraclinoid segments are widely patent. The left middle cerebral artery and the left anterior cerebral artery opacify into the capillary and venous phases. Transient cross-filling via the anterior communicating artery of the right anterior cerebral artery A2 segment is noted. The left subclavian artery demonstrates approximately 30% stenosis proximal to the origin of a dominant left vertebral artery. There is mild tortuosity of the proximal left vertebral artery. The vessel is, otherwise, seen to opacify normally to the cranial skull base. There is a mild focal stenoses of about 30% of the left vertebrobasilar junction proximal to the left posterior-inferior cerebellar artery. Distal to this mild caliber irregularity is noted of the distal left vertebrobasilar junction extending into the proximal basilar artery. The proximal basilar artery demonstrates a severe focal high-grade stenosis of 95% plus secondary to a circumferential plaque. Distal to this the basilar artery assumes normal caliber. A left anterior-inferior cerebellar artery/posterior-inferior cerebellar artery complex is noted. Opacification is noted of the left posterior cerebral artery, superior cerebellar arteries, and the anterior-inferior cerebellar arteries into the capillary and venous phases. There is a focal 50% stenosis of the left posterior cerebral artery at the P2 P3 junction. PROCEDURE: As above. IMPRESSION: Approximately 95% plus severe stenosis of the proximal basilar artery, with a more proximal approximately 30-50% stenosis. 50% stenosis at the origin of the right external carotid artery. Approximately 50% stenosis of the right internal carotid artery secondary to a circumferential atherosclerotic plaque. Approximately 50% stenosis of the proximal right anterior cerebral artery. PLAN: Discussed with the patient's referring stroke neurologist. Electronically Signed   By: [name] M.D.   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 EXAM: BILATERAL COMMON CAROTID ARTERY AND BILATERAL VERTEBRAL ARTERY ANGIOGRAMS COMPARISON:  CT angiogram of the head and neck of 03/17/2017, and MRI of the brain and MRA of the brain of 03/15/2017. MEDICATIONS: No antibiotic was administered within 1 hour of the procedure. ANESTHESIA/SEDATION: Versed 1 mg IV; Fentanyl 25 mcg IV Moderate Sedation Time:  25 minutes. The patient was continuously monitored during the procedure by the interventional radiology nurse under  my direct supervision. CONTRAST:  Isovue 300 approximately 60 mL. FLUOROSCOPY TIME:  Fluoroscopy Time: 7 minutes 18 seconds (959 mGy). COMPLICATIONS: None immediate. TECHNIQUE: Informed written consent was obtained from the patient after a thorough discussion of the procedural risks, benefits and alternatives. All questions were addressed. Maximal Sterile Barrier Technique was utilized including caps, mask, sterile gowns, sterile gloves, sterile drape, hand hygiene and skin antiseptic. A timeout was performed prior to the initiation of the procedure. The right groin was prepped and draped in the usual sterile fashion. Thereafter using modified Seldinger technique, transfemoral access into the right common femoral artery was obtained without difficulty. Over a 0.035 inch guidewire a 5 French Pinnacle sheath was inserted. Through this, and also over a 0.035 inch guidewire a 5 [place] JB 1 catheter was advanced to the aortic arch region and selectively positioned in the right subclavian artery, the right common carotid artery and left common carotid artery and left vertebral artery. There were no acute complications. The patient tolerated the procedure well. FINDINGS: The right subclavian arteriogram demonstrates a hypoplastic right vertebral artery origin to be normal. The vessel is seen to  ascend to the cranial skull base where it supplies the right posterior-inferior cerebellar artery. Contrast is seen extending into the right vertebrobasilar junction with nonvisualization of the basilar artery. The right common carotid arteriogram demonstrates moderate tortuosity of the distal right common carotid artery. There is a 50% stenosis at the origin of the right external carotid artery. Its branches, otherwise, opacify normally. The right internal carotid artery at the bulb demonstrates a 50% stenosis as well secondary to a concentric atherosclerotic plaque. Distal to this there is a double U-shaped tortuosity involving the proximal and the middle one thirds of the right internal carotid artery. No kinking is noted. Distal to this the vessel is seen to opacify normally to the cranial skull base. The petrous, the cavernous and the supraclinoid segments demonstrate wide patency. A dominant right posterior communicating artery is seen opacifying the right posterior cerebral artery distribution. The right middle cerebral artery is seen to opacify normally into the capillary and venous phases. The right anterior cerebral artery proximally demonstrates approximately 50% stenoses. However, opacification is noted distal to this to the right A1 A2 junction and subsequently the right anterior cerebral artery distribution into the capillary and venous phases. The left common carotid arteriogram demonstrates the left external carotid artery to be mildly narrowed at its origin. Its branches are normally opacified. The left internal carotid artery at the bulb demonstrates wide patency proximally. There again is a double U-shaped configuration of the junction of the proximal 1/3 and middle 1/3 of the left internal carotid artery. No associated kinking is seen. Distal to this the left internal carotid artery is seen to opacify normally to the cranial skull base. The petrous, cavernous and supraclinoid segments are widely  patent. The left middle cerebral artery and the left anterior cerebral artery opacify into the capillary and venous phases. Transient cross-filling via the anterior communicating artery of the right anterior cerebral artery A2 segment is noted. The left subclavian artery demonstrates approximately 30% stenosis proximal to the origin of a dominant left vertebral artery. There is mild tortuosity of the proximal left vertebral artery. The vessel is, otherwise, seen to opacify normally to the cranial skull base. There is a mild focal stenoses of about 30% of the left vertebrobasilar junction proximal to the left posterior-inferior cerebellar artery. Distal to this mild caliber irregularity is noted of the distal left  vertebrobasilar junction extending into the proximal basilar artery. The proximal basilar artery demonstrates a severe focal high-grade stenosis of 95% plus secondary to a circumferential plaque. Distal to this the basilar artery assumes normal caliber. A left anterior-inferior cerebellar artery/posterior-inferior cerebellar artery complex is noted. Opacification is noted of the left posterior cerebral artery, superior cerebellar arteries, and the anterior-inferior cerebellar arteries into the capillary and venous phases. There is a focal 50% stenosis of the left posterior cerebral artery at the P2 P3 junction. PROCEDURE: As above. IMPRESSION: Approximately 95% plus severe stenosis of the proximal basilar artery, with a more proximal approximately 30-50% stenosis. 50% stenosis at the origin of the right external carotid artery. Approximately 50% stenosis of the right internal carotid artery secondary to a circumferential atherosclerotic plaque. Approximately 50% stenosis of the proximal right anterior cerebral artery. PLAN: Discussed with the patient's referring stroke neurologist. Electronically Signed   By: [name] M.D.   On: 03/20/2017 12:25    Time Spent in minutes  30   Jani Gravel M.D  on 03/22/2017 at 8:19 AM  Between 7am to 7pm - Pager [phone]  After 7pm go to [email or web address] - password [email or web address]  [facility] -  Office  [phone]

## 2017-03-23 DIAGNOSIS — E782 Mixed hyperlipidemia: Secondary | ICD-10-CM

## 2017-03-23 MED ORDER — PRAVASTATIN SODIUM 40 MG PO TABS: 40.0000 mg | ORAL_TABLET | Freq: Every day | ORAL | Status: DC

## 2017-03-23 MED ORDER — ROSUVASTATIN CALCIUM 10 MG PO TABS
10.0000 mg | ORAL_TABLET | Freq: Every day | ORAL | Status: DC
Start: 2017-03-23 — End: 2017-03-23

## 2017-03-23 NOTE — Progress Notes (Signed)
IR has scheduled angioplasty and/or stent of basilar artery stenosis Wednesday 7/11 with Dr. Estanislado Pandy @ 8:45 am. Pt is to be NPO after midnight Tues night.

## 2017-03-23 NOTE — Progress Notes (Addendum)
[facility] PROGRESS NOTE    Progress Note  [name]  VVO:[id number] DOB: 1940-09-25 DOA: 03/15/2017 PCP: Hali Marry, MD     Brief Narrative:   Kathleen Dunn is an 76 y.o. female past medical history of asthma, hypertension, presented to [facility] [place] with episodes of intermittent shaking and 2 stating that started 2 days prior to admission these will last several minutes. CT scan of the head on the day of admission showed worsening area of low density in the cerebellum consistent with subacute cerebral infarct.  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 MRI, MRA of the brain without contrast scattered acute infarcts in both cerebral hemisphere longing to the left PCA territory. MRI shows severe mid basilar artery stenosis with occlusion of the right VA. Cerebral angiogram on 7.5.2018 showed proximal basilar artery stenosis of greater than 95%. EEG was normal, 2-D echo was unremarkable PT, OT recommended no further follow-up She was not on antithrombotic therapy prior to admission, now on Prophylactic therapy-Antiplatelet med: Aspirin - dose 325 mg PO daily and Plavix daily. risk factor modification  No evidence Cardiac Monitoring  She is scheduled for angiogram for revascularization with stent to the basilar artery on 03/24/2017 by Dr. Estanislado Pandy. She will like to talked to the interventional radiologist for the procedure. Patient is very anxious and nervous about the procedure.  Essential hypertension: Continue metoprolol, continue to hold diuretics.  Hypothyroidism: Continue Synthroid.  Anxiety: Continue Klonopin as needed.  Right knee pain: Continue topical Voltaren gel.   DVT prophylaxis: lovenox Family Communication:none Disposition Plan/Barrier to D/C: home 1 day Code Status:     Code Status  Orders        Start     Ordered   03/15/17 1715  Full code  Continuous     03/15/17 1715    Code Status History    Date Active Date Inactive Code Status Order ID Comments User Context   This patient has a current code status but no historical code status.        IV Access:    Peripheral IV   Procedures and diagnostic studies:   No results found.   Medical Consultants:    None.  Anti-Infectives:   None  Subjective:    Kathleen Dunn she is very anxious and nervous about the procedure with many questions currently has no new complaints.  Objective:    Vitals:   03/22/17 0523 03/22/17 1643 03/22/17 2103 03/23/17 0629  BP: 136/84 (!) 127/56 (!) 154/71 (!) 148/59  Pulse: 75 70 (!) 58 (!) 58  Resp: 18 17 18 18   Temp: 98.2 F (36.8 C) 98.5 F (36.9 C) 98.3 F (36.8 C) (!) 97.5 F (36.4 C)  TempSrc: Oral Oral    SpO2: 96% 96% 95% 97%  Weight:      Height:        Intake/Output Summary (Last 24 hours) at 03/23/17 0719 Last data filed at 03/23/17 0546  Gross per 24 hour  Intake          1165.83 ml  Output             1600 ml  Net          -434.17 ml   Filed Weights   03/15/17 0726  Weight: 86.2 kg (190 lb)    Exam: General exam: In no acute distress,  but nervous Respiratory system: Good air movement clear to auscultation. Cardiovascular system: Regular rate and rhythm with positive S1-S2 no murmurs rubs gallops Gastrointestinal system: Abdomen soft nontender nondistended Central nervous system: She is awake alert and oriented 3 no focal deficits. Extremities: No lower extremity edema Skin: No rashes. Psychiatry: Judgment and insight appeared normal mood and affect are appropriate.   Data Reviewed:    Labs: Basic Metabolic Panel:  Recent Labs Lab 03/17/17 0630 03/20/17 0823 03/21/17 1352 03/22/17 0335  NA 136 139 138 137  K 4.2 3.8 4.3 4.7  CL 104 109 105 104  CO2 24 24 25 27   GLUCOSE 98 92 122* 108*  BUN 16 7 8 9   CREATININE  0.92 0.87 0.89 0.90  CALCIUM 8.6* 8.1* 8.5* 8.7*   GFR Estimated Creatinine Clearance: 54.7 mL/min (by C-G formula based on SCr of 0.9 mg/dL). Liver Function Tests:  Recent Labs Lab 03/20/17 0823 03/21/17 1352 03/22/17 0335  AST 19 39 34  ALT 17 27 26   ALKPHOS 35* 43 39  BILITOT 0.6 1.0 0.9  PROT 5.4* 6.0* 5.9*  ALBUMIN 3.1* 3.4* 3.2*   No results for input(s): LIPASE, AMYLASE in the last 168 hours. No results for input(s): AMMONIA in the last 168 hours. Coagulation profile  Recent Labs Lab 03/17/17 0630 03/20/17 0823  INR 1.09 1.03    CBC:  Recent Labs Lab 03/17/17 0630 03/20/17 0823 03/21/17 1352 03/22/17 0335  WBC 7.3 6.1 6.9 8.0  HGB 13.5 12.3 13.0 12.6  HCT 40.9 36.6 38.6 37.8  MCV 93.2 94.1 94.1 93.1  PLT 232 219 242 222   Cardiac Enzymes: No results for input(s): CKTOTAL, CKMB, CKMBINDEX, TROPONINI in the last 168 hours. BNP (last 3 results) No results for input(s): PROBNP in the last 8760 hours. CBG:  Recent Labs Lab 03/19/17 2101  GLUCAP 124*   D-Dimer: No results for input(s): DDIMER in the last 72 hours. Hgb A1c: No results for input(s): HGBA1C in the last 72 hours. Lipid Profile: No results for input(s): CHOL, HDL, LDLCALC, TRIG, CHOLHDL, LDLDIRECT in the last 72 hours. Thyroid function studies: No results for input(s): TSH, T4TOTAL, T3FREE, THYROIDAB in the last 72 hours.  Invalid input(s): FREET3 Anemia work up: No results for input(s): VITAMINB12, FOLATE, FERRITIN, TIBC, IRON, RETICCTPCT in the last 72 hours. Sepsis Labs:  Recent Labs Lab 03/17/17 0630 03/20/17 0823 03/21/17 1352 03/22/17 0335  WBC 7.3 6.1 6.9 8.0   Microbiology No results found for this or any previous visit (from the past 240 hour(s)).   Medications:   .  stroke: mapping our early stages of recovery book   Does not apply Once  . aspirin EC  325 mg Oral Daily  . budesonide (PULMICORT) nebulizer solution  0.25 mg Nebulization BID  . clonazePAM  1 mg  Oral QHS  . clopidogrel  75 mg Oral Daily  . enoxaparin (LOVENOX) injection  40 mg Subcutaneous Q24H  . ezetimibe  10 mg Oral Daily  . famotidine  20 mg Oral BID  . levothyroxine  50 mcg Oral QAC breakfast  . losartan  50 mg Oral Daily  . rosuvastatin  40 mg Oral q1800   Continuous Infusions: . sodium chloride 50 mL/hr at 03/23/17 0142    Time spent: 25 min   LOS: 7 days   Charlynne Cousins  [facility] Pager [phone]  *Please refer to [email or web address], password [email or web address] to get updated schedule on who will round on this patient, as hospitalists switch teams weekly.  If 7PM-7AM, please contact night-coverage at [email or web address], password [email or web address] for any overnight needs.  03/23/2017, 7:19 AM

## 2017-03-24 DIAGNOSIS — I6523 Occlusion and stenosis of bilateral carotid arteries: Secondary | ICD-10-CM

## 2017-03-24 MED ORDER — ALUM & MAG HYDROXIDE-SIMETH 200-200-20 MG/5ML PO SUSP
30.0000 mL | Freq: Once | ORAL | Status: AC
  Administered 2017-03-24: 30 mL via ORAL
  Filled 2017-03-24: qty 30

## 2017-03-24 NOTE — Care Management Important Message (Signed)
Important Message  Patient Details  Name: Kathleen Dunn MRN: [id number] Date of Birth: 06-Feb-1941   Medicare Important Message Given:  Yes    Pranit Owensby 03/24/2017, 3:16 PM

## 2017-03-24 NOTE — Progress Notes (Signed)
Pt refused pulmicort and stated that she would call if she needed anything

## 2017-03-24 NOTE — Progress Notes (Signed)
Occupational Therapy Treatment Patient Details Name: Kathleen Dunn MRN: [id number] DOB: [birth year] Today's Date: 03/24/2017    History of present illness Pt is a 76 yo female admitted through ED on 03/15/17 due to an episode of L arm twisting, dizziness and fatigue. Pt was diagnosed with subacute cerebellar stroke. PMH significant for asthma, HTN, thyroid disease, anxiety.     OT comments  Pt progressing towards goals, completing room level functional mobility with supervision and Pt intermittently using furniture to steady. Pt completing toileting, standing grooming to wash hands at sink, and gathering supplies from closet for additional ADLs later today all with close supervision. Pt will benefit from continued OT services in acute setting to maximize Pt's safety and independence with ADLs and functional mobility prior to return home. POC remains appropriate, will continue to follow.    Follow Up Recommendations  No OT follow up    Equipment Recommendations  Other (comment) (tba further for tub DME)          Precautions / Restrictions Precautions Precautions: Fall Restrictions Weight Bearing Restrictions: No       Mobility Bed Mobility Overal bed mobility: Modified Independent             General bed mobility comments: use of rail  Transfers Overall transfer level: Modified independent Equipment used: None Transfers: Sit to/from Stand Sit to Stand: Min guard         General transfer comment: min guard for safety; Pt reaching out, using furniture to steady self during room level functional mobility     Balance Overall balance assessment: Needs assistance Sitting-balance support: No upper extremity supported;Feet supported Sitting balance-Leahy Scale: Normal     Standing balance support: No upper extremity supported Standing balance-Leahy Scale: Fair Standing balance comment: Reaching for furniture intermittently to steady                             ADL either performed or assessed with clinical judgement   ADL Overall ADL's : Needs assistance/impaired     Grooming: Wash/dry hands;Supervision/safety;Standing               Lower Body Dressing: Set up;Sit to/from stand Lower Body Dressing Details (indicate cue type and reason): setup for slide on bedroom shoes  Toilet Transfer: Supervision/safety;Ambulation;Regular Toilet   Toileting- Water quality scientist and Hygiene: Supervision/safety;Sit to/from stand       Functional mobility during ADLs: Supervision/safety General ADL Comments: Pt reporting she didn't sleep well last night, agreeable to toileting prior to returning to bed to rest, Pt completed room level functional mobility after toileting and washing hands to gather washcloths from closet for further grooming later (declining completing at time of session), close supervision throughout for safety and iv pole management                       Cognition Arousal/Alertness: Awake/alert Behavior During Therapy: [facility] for tasks assessed/performed Overall Cognitive Status: Within Functional Limits for tasks assessed                                                      General Comments      Pertinent Vitals/ Pain       Faces Pain Scale: Hurts a little bit Pain Location: head  Pain  Descriptors / Indicators: Headache Pain Intervention(s): Monitored during session;Limited activity within patient's tolerance                                                          Frequency  Min 2X/week        Progress Toward Goals  OT Goals(current goals can now be found in the care plan section)  Progress towards OT goals: Progressing toward goals  Acute Rehab OT Goals Patient Stated Goal: to feel better and get back home OT Goal Formulation: With patient Time For Goal Achievement: 04/01/17 Potential to Achieve Goals: Good  Plan Discharge plan remains appropriate                      AM-PAC PT "6 Clicks" Daily Activity     Outcome Measure   Help from another person eating meals?: None Help from another person taking care of personal grooming?: A Little Help from another person toileting, which includes using toliet, bedpan, or urinal?: A Little   Help from another person to put on and taking off regular upper body clothing?: A Little Help from another person to put on and taking off regular lower body clothing?: A Little 6 Click Score: 16    End of Session Equipment Utilized During Treatment: Gait belt  OT Visit Diagnosis: Unsteadiness on feet (R26.81);Muscle weakness (generalized) (M62.81)   Activity Tolerance Patient tolerated treatment well   Patient Left in bed;with call bell/phone within reach             Time: 0946-1000 OT Time Calculation (min): 14 min  Charges: OT General Charges $OT Visit: 1 Procedure OT Treatments $Self Care/Home Management : 8-22 mins  Lou Cal, OT Pager [phone] 03/24/2017    Raymondo Band 03/24/2017, 11:17 AM

## 2017-03-24 NOTE — Progress Notes (Signed)
STROKE TEAM PROGRESS NOTE   SUBJECTIVE (INTERVAL HISTORY) The patient is alone.  No acute event overnight.  DSA showed proximal BA 95% stenosis. Pt is scheduled for BA angioplasty and/or stent Wednesday, 03/26/2017.   OBJECTIVE Temp:  [97.6 F (36.4 C)-98.1 F (36.7 C)] 98.1 F (36.7 C) (07/08 2309) Pulse Rate:  [58-61] 58 (07/08 2309) Cardiac Rhythm: Normal sinus rhythm (07/09 0830) Resp:  [16-18] 18 (07/08 2309) BP: (115-141)/(50-54) 141/50 (07/08 2309) SpO2:  [95 %-99 %] 99 % (07/08 2309)  CBC:   Recent Labs Lab 03/21/17 1352 03/22/17 0335  WBC 6.9 8.0  HGB 13.0 12.6  HCT 38.6 37.8  MCV 94.1 93.1  PLT 242 944    Basic Metabolic Panel:   Recent Labs Lab 03/21/17 1352 03/22/17 0335  NA 138 137  K 4.3 4.7  CL 105 104  CO2 25 27  GLUCOSE 122* 108*  BUN 8 9  CREATININE 0.89 0.90  CALCIUM 8.5* 8.7*    Lipid Panel:     Component Value Date/Time   CHOL 272 (H) 03/16/2017 0717   TRIG 783 (H) 03/16/2017 0717   TRIG 239 03/07/2009   HDL 30 (L) 03/16/2017 0717   CHOLHDL 9.1 03/16/2017 0717   VLDL UNABLE TO CALCULATE IF TRIGLYCERIDE OVER 400 mg/dL 03/16/2017 0717   LDLCALC UNABLE TO CALCULATE IF TRIGLYCERIDE OVER 400 mg/dL 03/16/2017 0717   HgbA1c:  Lab Results  Component Value Date   HGBA1C 5.8 (H) 03/16/2017   Urine Drug Screen:     Component Value Date/Time   LABOPIA NONE DETECTED 03/15/2017 0825   COCAINSCRNUR NONE DETECTED 03/15/2017 0825   LABBENZ NONE DETECTED 03/15/2017 0825   AMPHETMU NONE DETECTED 03/15/2017 0825   THCU NONE DETECTED 03/15/2017 0825   LABBARB NONE DETECTED 03/15/2017 0825    Alcohol Level No results found for: [place] I have personally reviewed the radiological images below and agree with the radiology interpretations.  Ct Head Wo Contrast 03/15/2017 Worsening of areas of low-density in the cerebellum consistent with small acute/ subacute cerebellar infarctions. No sign of hemorrhage or mass effect.    Mr Jodene Nam  Head/brain Wo Cm 03/15/2017 1. 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 No associated hemorrhage or mass effect.  2. 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 3. 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 4. Attenuated flow in the distal left PCA. 5. Mild to moderate for age nonspecific cerebral white matter signal changes.  6. Negative anterior circulation.   CTA head and neck  1. Advanced atherosclerosis in the head and neck. 2. Short segment atheromatous occlusion or critical stenosis in the proximal basilar. 3. Moderate atheromatous narrowing in the distal left V4 segment and mid basilar. The hypoplastic right vertebral artery that is effectively occluded at the dura. 4. 60-70% proximal right ICA and 40-50% right common carotid stenosis due to atherosclerotic plaque. 5. Known acute infarcts in the bilateral cerebellum and left occipital lobe (there is a fetal type right PCA). No hemorrhagic conversion or evidence of progression.  DSA 1.Pre occlusive 95% plus stenosis  of   prox basilar artery stenosis. 2.Approx 50 % stenosis of Rt  ICA prox,and of RT ECA at origin.  TTE - Left ventricle: The cavity size was normal. Wall thickness was   increased in a pattern of moderate LVH. Systolic function was   normal. The estimated ejection fraction was in the  range of 60%   to 65%. Wall motion was normal; there were no regional wall   motion abnormalities. Doppler parameters are consistent with   abnormal left ventricular relaxation (grade 1 diastolic   dysfunction). - Aortic valve: There was trivial regurgitation. - Pericardium, extracardiac: A trivial pericardial effusion was   identified.  EEG - A normal EEG does not exclude a clinical diagnosis of epilepsy.  If further clinical questions remain, prolonged EEG may be helpful.  Clinical correlation is  advised.   PHYSICAL EXAM  Temp:  [97.6 F (36.4 C)-98.1 F (36.7 C)] 98.1 F (36.7 C) (07/08 2309) Pulse Rate:  [58-61] 58 (07/08 2309) Resp:  [16-18] 18 (07/08 2309) BP: (115-141)/(50-54) 141/50 (07/08 2309) SpO2:  [95 %-99 %] 99 % (07/08 2309)  General - Well nourished, well developed, in no apparent distress.  Ophthalmologic - Fundi not visualized due to noncooperation.  Cardiovascular - Regular rate and rhythm.  Mental Status -  Level of arousal and orientation to time, place, and person were intact. Language including expression, naming, repetition, comprehension was assessed and found intact. Fund of Knowledge was assessed and was intact.  Cranial Nerves II - XII - II - Visual field intact OU. III, IV, VI - Extraocular movements intact. V - Facial sensation intact bilaterally. VII - Facial movement intact bilaterally. VIII - Hearing & vestibular intact bilaterally. X - Palate elevates symmetrically. XI - Chin turning & shoulder shrug intact bilaterally. XII - Tongue protrusion intact.  Motor Strength - The patient's strength was normal in all extremities and pronator drift was absent.  Bulk was normal and fasciculations were absent.   Motor Tone - Muscle tone was assessed at the neck and appendages and was normal.  Reflexes - The patient's reflexes were 1+ in all extremities and she had no pathological reflexes.  Sensory - Light touch, temperature/pinprick were assessed and were symmetrical.    Coordination - The patient had normal movements in the hands and feet with no ataxia or dysmetria.  Essential tremor was present b/l, more on the right than left.  Gait and Station - deferred   ASSESSMENT/PLAN [name] is a 76 y.o. female with history of hypertension, asthma, known lung nodule, and thyroid disease presenting with uncontrollable movements of the left upper extremity. She did not receive IV t-PA due to transient deficits.  Strokes:  scattered  acute infarcts in both cerebellar hemispheres and the Lt PCA territory - likely embolic from [facility].  Resultant  No deficit  CT head - small acute/ subacute cerebellar infarctions.  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  CTA H&N - proximal BA high-grade stenosis, mid BA atherosclerosis with post stenotic dilatation, left VA atherosclerosis, right VA hypoplastic with distal occlusion, proximal right ICA 60-70% stenosis and proximal left ICA 40-50% stenosis.  Cerebral angio - proximal BA > 95% stenosis  EEG - normal  2D Echo - unremarkable  LDL - unable to calculate - triglycerides 783  HgbA1c 5.8  VTE prophylaxis - Lovenox Diet Carb Modified Fluid consistency: Thin; Room service appropriate? Yes Diet NPO time specified  No antithrombotic prior to admission, now on aspirin 325 mg daily and clopidogrel 75 mg daily.  Patient counseled to be compliant with her antithrombotic medications  Ongoing aggressive stroke risk factor management  Therapy recommendations: pending  Disposition:  Pending - pt is scheduled for BA angioplasty and/or stenting Wednesday 03/24/2017 with Dr. Estanislado Pandy.  Posterior circulation extensive athero  MRA showed BA severe stenosis, right VA occlusion and left PCA decreased flow  CTA head and neck proximal BA high-grade stenosis, mid BA atherosclerosis with post stenotic dilatation, left VA atherosclerosis, right VA hypoplastic with distal occlusion  DSA proximal BA > 95% stenosis  Continue DAPT  Carotid stenosis  CTA head and neck - proximal right ICA 60-70% stenosis and proximal left ICA 40-50% stenosis.  DSA about 50% stenosis of right ICA  On DAPT  Hypertension  Stable  On home meds - low dose metoprolol and losartan  BP goal 130-160 due to intracranial  stenosis  Hyperlipidemia  Home meds:  No lipid lowering medications prior to admission  LDL could not be calculated secondary to elevated triglycerides, goal < 70  Now on crestor 40 and zetia 10  Continue at discharge  Other Stroke Risk Factors  Advanced age  Former cigarette smoker - quit 45 years ago.  Obesity, Body mass index is 35.9 kg/m., recommend weight loss, diet and exercise as appropriate   Depo Estradiol PTA  Other Active Problems  None  Hospital day # 8  I have personally examined this patient, reviewed notes, independently viewed imaging studies, participated in medical decision making and plan of care.ROS completed by me personally and pertinent positives fully documented  I have made any additions or clarifications directly to the above note. Patient appears quite anxious and has a lot of questions about her procedure. I will communicate to the neuro interventional team the patient's concerns and have them come by and address them. Greater than 50% time during this 25 minute visit was spent on counseling and coordination of care about her stroke, basilar stenosis, angioplasty and stenting discussion and answering questions  Antony Contras, MD Medical Director [place] Pager: [phone] 03/24/2017 3:16 PM  Stroke Neurology 03/24/2017 11:55 AM   To contact Stroke Continuity provider, please refer to [email or web address] After hours, contact General Neurology

## 2017-03-24 NOTE — Progress Notes (Signed)
PROGRESS NOTE    Kathleen Dunn  PZW:[id number] DOB: 06/03/1941 DOA: 03/15/2017 PCP: Hali Marry, MD   Brief Narrative: Kathleen Dunn is a 76 y.o. chemotherapy history of hypertension, hypokalemia, generalized anxiety disorder. Patient presents intermittent shaking and was found to have a acute CVA.   Assessment & Plan:   Principal Problem:   Cerebellar stroke (HCC) Active Problems:   Hypothyroidism   HYPERCHOLESTEROLEMIA   HYPERTENSION, BENIGN SYSTEMIC   GAD (generalized anxiety disorder)   Mixed hyperlipidemia   Basilar artery stenosis   Bilateral carotid artery stenosis   Cerebrovascular accident (CVA) due to thrombosis of precerebral artery (HCC)   Acute cerebellar stroke Secondary to basilar artery stenosis. CVA seen on MRI. Angiogram significant for proximal basilar artery stenosis and 95%. Patient scheduled for angiogram vascularization and stent placement. -Interventional radiology recommendations -Neurology recommendations -Continue aspirin and Plavix -PT/OT recommendations: None  Essential hypertension -Continue metoprolol  Hypothyroidism -Continue Synthroid  Anxiety -Continue Klonopin when necessary  Right knee pain Likely secondary to arthritis. -Continue Voltaren gel   DVT prophylaxis: Lovenox Code Status: Full code Family Communication: None at bedside Disposition Plan: Discharge pending a IR intervention   Consultants:   Interventional radiology  Neurology/stroke team  Procedures:   Echocardiogram: Grade 1 diastolic dysfunction, EF of 60-65%, moderate LVH  Antimicrobials:  None    Subjective: Patient reports no issues overnight. Concerns over the procedure.  Objective: Vitals:   03/23/17 0629 03/23/17 1556 03/23/17 2130 03/23/17 2309  BP: (!) 148/59 (!) 115/54  (!) 141/50  Pulse: (!) 58 61  (!) 58  Resp: 18 16  18   Temp: (!) 97.5 F (36.4 C) 97.6 F (36.4 C)  98.1 F (36.7 C)  TempSrc:      SpO2: 97% 95%  96% 99%  Weight:      Height:        Intake/Output Summary (Last 24 hours) at 03/24/17 1220 Last data filed at 03/24/17 1115  Gross per 24 hour  Intake          1252.49 ml  Output             1300 ml  Net           -47.51 ml   Filed Weights   03/15/17 0726  Weight: 86.2 kg (190 lb)    Examination:  General exam: Appears calm and comfortable Respiratory system: Clear to auscultation. Respiratory effort normal. Cardiovascular system: S1 & S2 heard, RRR. No murmurs, rubs, gallops or clicks. Gastrointestinal system: Abdomen is nondistended, soft and nontender. Normal bowel sounds heard. Central nervous system: Alert and oriented. No focal neurological deficits. Extremities: No edema. No calf tenderness Skin: No cyanosis. No rashes Psychiatry: Judgement and insight appear normal. Anxious with normal affect.     Data Reviewed: I have personally reviewed following labs and imaging studies  CBC:  Recent Labs Lab 03/20/17 0823 03/21/17 1352 03/22/17 0335  WBC 6.1 6.9 8.0  HGB 12.3 13.0 12.6  HCT 36.6 38.6 37.8  MCV 94.1 94.1 93.1  PLT 219 242 423   Basic Metabolic Panel:  Recent Labs Lab 03/20/17 0823 03/21/17 1352 03/22/17 0335  NA 139 138 137  K 3.8 4.3 4.7  CL 109 105 104  CO2 24 25 27   GLUCOSE 92 122* 108*  BUN 7 8 9   CREATININE 0.87 0.89 0.90  CALCIUM 8.1* 8.5* 8.7*   GFR: Estimated Creatinine Clearance: 54.7 mL/min (by C-G formula based on SCr of 0.9 mg/dL). Liver Function Tests:  Recent Labs  Lab 03/20/17 0823 03/21/17 1352 03/22/17 0335  AST 19 39 34  ALT 17 27 26   ALKPHOS 35* 43 39  BILITOT 0.6 1.0 0.9  PROT 5.4* 6.0* 5.9*  ALBUMIN 3.1* 3.4* 3.2*   No results for input(s): LIPASE, AMYLASE in the last 168 hours. No results for input(s): AMMONIA in the last 168 hours. Coagulation Profile:  Recent Labs Lab 03/20/17 0823  INR 1.03   Cardiac Enzymes: No results for input(s): CKTOTAL, CKMB, CKMBINDEX, TROPONINI in the last 168  hours. BNP (last 3 results) No results for input(s): PROBNP in the last 8760 hours. HbA1C: No results for input(s): HGBA1C in the last 72 hours. CBG:  Recent Labs Lab 03/19/17 2101  GLUCAP 124*       Radiology Studies: No results found.      Scheduled Meds: .  stroke: mapping our early stages of recovery book   Does not apply Once  . aspirin EC  325 mg Oral Daily  . budesonide (PULMICORT) nebulizer solution  0.25 mg Nebulization BID  . clonazePAM  1 mg Oral QHS  . clopidogrel  75 mg Oral Daily  . enoxaparin (LOVENOX) injection  40 mg Subcutaneous Q24H  . ezetimibe  10 mg Oral Daily  . famotidine  20 mg Oral BID  . levothyroxine  50 mcg Oral QAC breakfast  . losartan  50 mg Oral Daily  . rosuvastatin  40 mg Oral q1800   Continuous Infusions: . sodium chloride 50 mL/hr at 03/24/17 0005     LOS: 8 days     Cordelia Poche, MD [facility] 03/24/2017, 12:20 PM Pager: [phone]  If 7PM-7AM, please contact night-coverage [email or web address] Password [email or web address] 03/24/2017, 12:20 PM

## 2017-03-25 ENCOUNTER — Other Ambulatory Visit: Payer: Self-pay | Admitting: Physician Assistant

## 2017-03-25 ENCOUNTER — Other Ambulatory Visit (HOSPITAL_COMMUNITY): Payer: Self-pay | Admitting: Radiology

## 2017-03-25 ENCOUNTER — Other Ambulatory Visit (HOSPITAL_COMMUNITY): Payer: Self-pay | Admitting: *Deleted

## 2017-03-25 ENCOUNTER — Other Ambulatory Visit: Payer: Self-pay | Admitting: Student

## 2017-03-25 MED ORDER — ENOXAPARIN SODIUM 40 MG/0.4ML ~~LOC~~ SOLN
40.0000 mg | SUBCUTANEOUS | Status: DC
  Administered 2017-03-27 – 2017-04-01 (×6): 40 mg via SUBCUTANEOUS
  Filled 2017-03-25 (×6): qty 0.4

## 2017-03-25 MED ORDER — TRAMADOL HCL 50 MG PO TABS
50.0000 mg | ORAL_TABLET | Freq: Four times a day (QID) | ORAL | Status: DC | PRN
  Filled 2017-03-25 (×4): qty 1

## 2017-03-25 MED ORDER — SODIUM CHLORIDE 0.9 % IV SOLN
INTRAVENOUS | Status: DC
  Administered 2017-03-26: via INTRAVENOUS

## 2017-03-25 MED ORDER — SODIUM CHLORIDE 0.9 % IV SOLN: INTRAVENOUS | Status: DC

## 2017-03-25 NOTE — Consult Note (Signed)
           [facility] CM Primary Care Navigator  03/25/2017  Kathleen Dunn 1941/05/19 [id number]   Went back to see and follow-up with patient at the bedside to identify possible discharge needs but she was fast asleep (with blinds shut and lights off) and no family members seen in the room.  Will attempt to follow-up with patient at another time when available in the room.  For questions, please contact:  Dannielle Huh, BSN, RN- [facility] Primary Care Navigator  Telephone: [phone] [place]

## 2017-03-25 NOTE — Progress Notes (Signed)
Physical Therapy Treatment Patient Details Name: Kathleen Dunn MRN: [id number] DOB: Oct 09, 1940 Today's Date: 03/25/2017    History of Present Illness Pt is a 76 yo female admitted through ED on 03/15/17 due to an episode of L arm twisting, dizziness and fatigue. Pt was diagnosed with subacute cerebellar stroke. PMH significant for asthma, HTN, thyroid disease, anxiety.      PT Comments    Patient with decreased balance/gait as compared to last PT session.  She relates having taken meds for nausea and anxiety and feeling more tired this pm.  Feel she could benefit from HHPT at d/c as well as possibly need for cane for stability with ambulation.  Continue skilled PT during acute stay.   Follow Up Recommendations  Home health PT     Equipment Recommendations  Cane    Recommendations for Other Services       Precautions / Restrictions Precautions Precautions: Fall    Mobility  Bed Mobility Overal bed mobility: Modified Independent                Transfers Overall transfer level: Needs assistance   Transfers: Sit to/from Stand Sit to Stand: Supervision         General transfer comment: for safety with IV  Ambulation/Gait Ambulation/Gait assistance: Min guard Ambulation Distance (Feet): 400 Feet Assistive device: None Gait Pattern/deviations: Step-through pattern;Decreased stride length;Trendelenburg     General Gait Details: noted veers L with looking to L , also with cross steps at times due to L hip weakness with postive trendelenberg   Stairs            Wheelchair Mobility    Modified Rankin (Stroke Patients Only) Modified Rankin (Stroke Patients Only) Pre-Morbid Rankin Score: No symptoms Modified Rankin: Moderately severe disability     Balance Overall balance assessment: Needs assistance Sitting-balance support: No upper extremity supported;Feet supported Sitting balance-Leahy Scale: Normal     Standing balance support: No upper  extremity supported Standing balance-Leahy Scale: Good Standing balance comment: standing balance activities in hallway with intermittent UE support including forward march, backwards walking, single limb standing, side stepping and standing hip abduction                            Cognition Arousal/Alertness: Awake/alert Behavior During Therapy: WFL for tasks assessed/performed Overall Cognitive Status: Within Functional Limits for tasks assessed                                        Exercises      General Comments General comments (skin integrity, edema, etc.): Patient tearful during session relating issues with her children not willing to take her into their home and rather wanting her to go into "retirement home"  States she would prefer to be with family, but feels oldest daughter circumventing her from staying with anyone.      Pertinent Vitals/Pain Faces Pain Scale: No hurt    Home Living                      Prior Function            PT Goals (current goals can now be found in the care plan section) Progress towards PT goals: Progressing toward goals    Frequency    Min 3X/week      PT Plan Discharge plan  needs to be updated    Co-evaluation              AM-PAC PT "6 Clicks" Daily Activity  Outcome Measure  Difficulty turning over in bed (including adjusting bedclothes, sheets and blankets)?: None Difficulty moving from lying on back to sitting on the side of the bed? : None Difficulty sitting down on and standing up from a chair with arms (e.g., wheelchair, bedside commode, etc,.)?: A Little Help needed moving to and from a bed to chair (including a wheelchair)?: A Little Help needed walking in hospital room?: A Little Help needed climbing 3-5 steps with a railing? : A Little 6 Click Score: 20    End of Session Equipment Utilized During Treatment: Gait belt Activity Tolerance: Patient tolerated treatment  well Patient left: in chair;with nursing/sitter in room   PT Visit Diagnosis: Unsteadiness on feet (R26.81)     Time: 0093-8182 PT Time Calculation (min) (ACUTE ONLY): 37 min  Charges:  $Gait Training: 8-22 mins $Neuromuscular Re-education: 8-22 mins                    G CodesMagda Dunn, [place] [phone] 03/25/2017    Kathleen Dunn 03/25/2017, 5:34 PM

## 2017-03-25 NOTE — Progress Notes (Signed)
Pt diet indicated NPO, procedure scheduled for 03/26/17, and pt stated "I need to be NPO on 03/26/17".Refused to stay NPO last night, will notify day shift team.

## 2017-03-25 NOTE — Progress Notes (Signed)
PROGRESS NOTE    Kathleen Dunn  QZE:[id number] DOB: May 06, 1941 DOA: 03/15/2017 PCP: Hali Marry, MD   Brief Narrative: Kathleen Dunn is a 76 y.o. chemotherapy history of hypertension, hypokalemia, generalized anxiety disorder. Patient presents intermittent shaking and was found to have a acute CVA.   Assessment & Plan:   Principal Problem:   Cerebellar stroke (HCC) Active Problems:   Hypothyroidism   HYPERCHOLESTEROLEMIA   HYPERTENSION, BENIGN SYSTEMIC   GAD (generalized anxiety disorder)   Mixed hyperlipidemia   Basilar artery stenosis   Bilateral carotid artery stenosis   Cerebrovascular accident (CVA) due to thrombosis of precerebral artery (HCC)   Acute cerebellar stroke Secondary to basilar artery stenosis. CVA seen on MRI. Angiogram significant for proximal basilar artery stenosis and 95%. Patient scheduled for angiogram vascularization and stent placement. -Interventional radiology recommendations -Neurology recommendations -Continue aspirin and Plavix -PT/OT recommendations: None  Essential hypertension -Continue metoprolol  Hypothyroidism -Continue Synthroid  Anxiety -Continue Klonopin when necessary  Right knee pain Likely secondary to arthritis. -Continue Voltaren gel  Headache Chronic. -continue Tylenol prn -restart home Tramdol   DVT prophylaxis: Lovenox Code Status: Full code Family Communication: None at bedside Disposition Plan: Discharge pending a IR intervention   Consultants:   Interventional radiology  Neurology/stroke team  Procedures:   Echocardiogram: Grade 1 diastolic dysfunction, EF of 60-65%, moderate LVH  Antimicrobials:  None    Subjective: Headache improved with Tylenol  Objective: Vitals:   03/24/17 1514 03/24/17 2130 03/24/17 2132 03/25/17 0517  BP: (!) 124/58 (!) 156/121 (!) 158/81 (!) 137/49  Pulse: (!) 59 73 73 92  Resp: 16 18  18   Temp: (!) 97.5 F (36.4 C) 97.8 F (36.6 C)  98.5 F  (36.9 C)  TempSrc: Oral Oral    SpO2: 97% 97% 97% 100%  Weight:      Height:        Intake/Output Summary (Last 24 hours) at 03/25/17 1033 Last data filed at 03/25/17 0700  Gross per 24 hour  Intake          1011.67 ml  Output              900 ml  Net           111.67 ml   Filed Weights   03/15/17 0726  Weight: 86.2 kg (190 lb)    Examination:  General exam: Appears calm and comfortable Respiratory system: Clear to auscultation bilaterally. Unlabored work of breathing. No wheezing or rales. Cardiovascular system: Regular rate and rhythm. Normal S1 and S2. No heart murmurs present. No extra heart sounds Gastrointestinal system: Abdomen is nondistended, soft and nontender. Normal bowel sounds heard. Central nervous system: Alert and oriented. No focal neurological deficits. Extremities: No edema. No calf tenderness Skin: No cyanosis. No rashes Psychiatry: Judgement and insight appear normal. Anxious with normal affect.     Data Reviewed: I have personally reviewed following labs and imaging studies  CBC:  Recent Labs Lab 03/20/17 0823 03/21/17 1352 03/22/17 0335  WBC 6.1 6.9 8.0  HGB 12.3 13.0 12.6  HCT 36.6 38.6 37.8  MCV 94.1 94.1 93.1  PLT 219 242 226   Basic Metabolic Panel:  Recent Labs Lab 03/20/17 0823 03/21/17 1352 03/22/17 0335  NA 139 138 137  K 3.8 4.3 4.7  CL 109 105 104  CO2 24 25 27   GLUCOSE 92 122* 108*  BUN 7 8 9   CREATININE 0.87 0.89 0.90  CALCIUM 8.1* 8.5* 8.7*   GFR: Estimated Creatinine Clearance: 54.7  mL/min (by C-G formula based on SCr of 0.9 mg/dL). Liver Function Tests:  Recent Labs Lab 03/20/17 0823 03/21/17 1352 03/22/17 0335  AST 19 39 34  ALT 17 27 26   ALKPHOS 35* 43 39  BILITOT 0.6 1.0 0.9  PROT 5.4* 6.0* 5.9*  ALBUMIN 3.1* 3.4* 3.2*   No results for input(s): LIPASE, AMYLASE in the last 168 hours. No results for input(s): AMMONIA in the last 168 hours. Coagulation Profile:  Recent Labs Lab 03/20/17 0823    INR 1.03   Cardiac Enzymes: No results for input(s): CKTOTAL, CKMB, CKMBINDEX, TROPONINI in the last 168 hours. BNP (last 3 results) No results for input(s): PROBNP in the last 8760 hours. HbA1C: No results for input(s): HGBA1C in the last 72 hours. CBG:  Recent Labs Lab 03/19/17 2101  GLUCAP 124*       Radiology Studies: No results found.      Scheduled Meds: .  stroke: mapping our early stages of recovery book   Does not apply Once  . aspirin EC  325 mg Oral Daily  . budesonide (PULMICORT) nebulizer solution  0.25 mg Nebulization BID  . clonazePAM  1 mg Oral QHS  . clopidogrel  75 mg Oral Daily  . [START ON 03/27/2017] enoxaparin (LOVENOX) injection  40 mg Subcutaneous Q24H  . ezetimibe  10 mg Oral Daily  . famotidine  20 mg Oral BID  . levothyroxine  50 mcg Oral QAC breakfast  . losartan  50 mg Oral Daily  . rosuvastatin  40 mg Oral q1800   Continuous Infusions: . sodium chloride 50 mL/hr at 03/24/17 2237     LOS: 9 days     Cordelia Poche, MD [facility] 03/25/2017, 10:33 AM Pager: [phone]  If 7PM-7AM, please contact night-coverage [email or web address] Password [facility] 03/25/2017, 10:33 AM

## 2017-03-26 ENCOUNTER — Inpatient Hospital Stay (HOSPITAL_COMMUNITY): Payer: Medicare Other

## 2017-03-26 ENCOUNTER — Encounter (HOSPITAL_COMMUNITY): Payer: Self-pay | Admitting: Surgery

## 2017-03-26 ENCOUNTER — Inpatient Hospital Stay (HOSPITAL_COMMUNITY): Payer: Medicare Other | Admitting: Certified Registered"

## 2017-03-26 ENCOUNTER — Encounter (HOSPITAL_COMMUNITY)
Admission: EM | Disposition: A | Discharge status: Skilled Nursing Facility | Payer: Self-pay | Source: Home / Self Care | Attending: Family Medicine

## 2017-03-26 ENCOUNTER — Other Ambulatory Visit (HOSPITAL_COMMUNITY): Payer: Self-pay | Admitting: *Deleted

## 2017-03-26 DIAGNOSIS — I6322 Cerebral infarction due to unspecified occlusion or stenosis of basilar arteries: Secondary | ICD-10-CM | POA: Diagnosis present

## 2017-03-26 HISTORY — PX: RADIOLOGY WITH ANESTHESIA: SHX6223

## 2017-03-26 HISTORY — PX: IR PTA INTRACRANIAL: IMG2344

## 2017-03-26 LAB — POCT ACTIVATED CLOTTING TIME
ACTIVATED CLOTTING TIME: 208 s
ACTIVATED CLOTTING TIME: 202 s
Activated Clotting Time: 202 seconds

## 2017-03-26 LAB — CBC WITH DIFFERENTIAL/PLATELET
BASOS PCT: 1 %
Basophils Absolute: 0.1 10*3/uL (ref 0.0–0.1)
EOS ABS: 0.4 10*3/uL (ref 0.0–0.7)
Eosinophils Relative: 4 %
HCT: 38.2 % (ref 36.0–46.0)
HEMOGLOBIN: 12.7 g/dL (ref 12.0–15.0)
Lymphocytes Relative: 24 %
Lymphs Abs: 2.1 10*3/uL (ref 0.7–4.0)
MCH: 31.2 pg (ref 26.0–34.0)
MCHC: 33.2 g/dL (ref 30.0–36.0)
MCV: 93.9 fL (ref 78.0–100.0)
MONOS PCT: 10 %
Monocytes Absolute: 0.9 10*3/uL (ref 0.1–1.0)
NEUTROS PCT: 61 %
Neutro Abs: 5.3 10*3/uL (ref 1.7–7.7)
Platelets: 243 10*3/uL (ref 150–400)
RBC: 4.07 MIL/uL (ref 3.87–5.11)
RDW: 13.5 % (ref 11.5–15.5)
WBC: 8.6 10*3/uL (ref 4.0–10.5)

## 2017-03-26 LAB — COMPREHENSIVE METABOLIC PANEL
ALT: 43 U/L (ref 14–54)
ANION GAP: 9 (ref 5–15)
AST: 32 U/L (ref 15–41)
Albumin: 3.5 g/dL (ref 3.5–5.0)
Alkaline Phosphatase: 42 U/L (ref 38–126)
BILIRUBIN TOTAL: 1 mg/dL (ref 0.3–1.2)
BUN: 11 mg/dL (ref 6–20)
CO2: 27 mmol/L (ref 22–32)
Calcium: 8.9 mg/dL (ref 8.9–10.3)
Chloride: 102 mmol/L (ref 101–111)
Creatinine, Ser: 0.88 mg/dL (ref 0.44–1.00)
GFR calc Af Amer: >60 mL/min (ref >60)
Glucose, Bld: 108 mg/dL — ABNORMAL HIGH (ref 65–99)
POTASSIUM: 3.7 mmol/L (ref 3.5–5.1)
Sodium: 138 mmol/L (ref 135–145)
TOTAL PROTEIN: 6.1 g/dL — AB (ref 6.5–8.1)

## 2017-03-26 LAB — PROTIME-INR
INR: 1.03
PROTHROMBIN TIME: 13.5 s (ref 11.4–15.2)

## 2017-03-26 LAB — APTT: aPTT: 35 seconds (ref 24–36)

## 2017-03-26 LAB — PLATELET INHIBITION P2Y12: PLATELET FUNCTION P2Y12: 222 [PRU] (ref 194–418)

## 2017-03-26 LAB — GLUCOSE, CAPILLARY: Glucose-Capillary: 89 mg/dL (ref 65–99)

## 2017-03-26 LAB — HEPARIN LEVEL (UNFRACTIONATED)

## 2017-03-26 SURGERY — RADIOLOGY WITH ANESTHESIA
Anesthesia: General

## 2017-03-26 MED ORDER — DIPHENHYDRAMINE HCL 50 MG/ML IJ SOLN
INTRAMUSCULAR | Status: AC
  Administered 2017-03-26: 50 mg via INTRAVENOUS
  Filled 2017-03-26: qty 1

## 2017-03-26 MED ORDER — FENTANYL CITRATE (PF) 100 MCG/2ML IJ SOLN
25.0000 ug | INTRAMUSCULAR | Status: DC | PRN
  Administered 2017-03-26 (×2): 50 ug via INTRAVENOUS

## 2017-03-26 MED ORDER — ACETAMINOPHEN 325 MG PO TABS: 650.0000 mg | ORAL_TABLET | ORAL | Status: DC | PRN

## 2017-03-26 MED ORDER — VANCOMYCIN HCL IN DEXTROSE 1-5 GM/200ML-% IV SOLN
INTRAVENOUS | Status: AC
  Filled 2017-03-26: qty 200

## 2017-03-26 MED ORDER — VANCOMYCIN HCL 1000 MG IV SOLR
1000.0000 mg | INTRAVENOUS | Status: AC
  Administered 2017-03-26: 1000 mg via INTRAVENOUS
  Filled 2017-03-26: qty 1000

## 2017-03-26 MED ORDER — HEPARIN SOD (PORK) LOCK FLUSH 100 UNIT/ML IV SOLN
500.0000 [IU] | Freq: Once | INTRAVENOUS | Status: AC
Start: 2017-03-26 — End: 2017-03-26
  Administered 2017-03-26: 500 [IU] via INTRAVENOUS
  Filled 2017-03-26: qty 5

## 2017-03-26 MED ORDER — EPTIFIBATIDE 20 MG/10ML IV SOLN
INTRAVENOUS | Status: AC
  Filled 2017-03-26: qty 10

## 2017-03-26 MED ORDER — ROCURONIUM BROMIDE 10 MG/ML (PF) SYRINGE
PREFILLED_SYRINGE | INTRAVENOUS | Status: DC | PRN
  Administered 2017-03-26: 10 mg via INTRAVENOUS
  Administered 2017-03-26: 60 mg via INTRAVENOUS
  Administered 2017-03-26 (×2): 10 mg via INTRAVENOUS

## 2017-03-26 MED ORDER — CLEVIDIPINE BUTYRATE 0.5 MG/ML IV EMUL
0.0000 mg/h | INTRAVENOUS | Status: DC
  Filled 2017-03-26: qty 50

## 2017-03-26 MED ORDER — PROPOFOL 10 MG/ML IV BOLUS
INTRAVENOUS | Status: DC | PRN
  Administered 2017-03-26: 20 mg via INTRAVENOUS
  Administered 2017-03-26: 170 mg via INTRAVENOUS

## 2017-03-26 MED ORDER — CHLORHEXIDINE GLUCONATE 4 % EX LIQD
CUTANEOUS | Status: AC
  Filled 2017-03-26: qty 15

## 2017-03-26 MED ORDER — NIMODIPINE 30 MG PO CAPS
0.0000 mg | ORAL_CAPSULE | ORAL | Status: DC
  Filled 2017-03-26: qty 2

## 2017-03-26 MED ORDER — NITROGLYCERIN 1 MG/10 ML FOR IR/CATH LAB
INTRA_ARTERIAL | Status: AC
  Filled 2017-03-26: qty 10

## 2017-03-26 MED ORDER — PROTAMINE SULFATE 10 MG/ML IV SOLN
INTRAVENOUS | Status: DC | PRN
  Administered 2017-03-26: 5 mg via INTRAVENOUS

## 2017-03-26 MED ORDER — HEPARIN (PORCINE) IN NACL 100-0.45 UNIT/ML-% IJ SOLN
750.0000 [IU]/h | INTRAMUSCULAR | Status: DC
  Filled 2017-03-26: qty 250

## 2017-03-26 MED ORDER — CLEVIDIPINE BUTYRATE 0.5 MG/ML IV EMUL
0.0000 mg/h | INTRAVENOUS | Status: DC
  Administered 2017-03-27: 3 mg/h via INTRAVENOUS
  Filled 2017-03-26: qty 50

## 2017-03-26 MED ORDER — HEPARIN (PORCINE) IN NACL 100-0.45 UNIT/ML-% IJ SOLN
INTRAMUSCULAR | Status: AC
  Administered 2017-03-26: 650 [IU]/h via INTRAVENOUS
  Filled 2017-03-26: qty 250

## 2017-03-26 MED ORDER — CLOPIDOGREL BISULFATE 75 MG PO TABS
75.0000 mg | ORAL_TABLET | ORAL | Status: AC
  Administered 2017-03-26: 75 mg via ORAL

## 2017-03-26 MED ORDER — EPTIFIBATIDE 20 MG/10ML IV SOLN
INTRAVENOUS | Status: AC | PRN
  Administered 2017-03-26 (×2): 1.8 mg via INTRAVENOUS

## 2017-03-26 MED ORDER — CLEVIDIPINE 50 MG/100ML IV EMUL
1.0000 mg/h | INTRAVENOUS | Status: AC
  Administered 2017-03-26: 1 mg/h via INTRAVENOUS
  Filled 2017-03-26: qty 100

## 2017-03-26 MED ORDER — SODIUM CHLORIDE 0.9 % IV SOLN
INTRAVENOUS | Status: DC | PRN
  Administered 2017-03-26: 09:00:00 via INTRAVENOUS

## 2017-03-26 MED ORDER — ACETAMINOPHEN 160 MG/5ML PO SOLN: 650.0000 mg | ORAL | Status: DC | PRN

## 2017-03-26 MED ORDER — ASPIRIN 325 MG PO TABS: 325.0000 mg | ORAL_TABLET | Freq: Every day | ORAL | Status: DC

## 2017-03-26 MED ORDER — SUGAMMADEX SODIUM 200 MG/2ML IV SOLN
INTRAVENOUS | Status: DC | PRN
  Administered 2017-03-26: 170 mg via INTRAVENOUS

## 2017-03-26 MED ORDER — LIDOCAINE 2% (20 MG/ML) 5 ML SYRINGE
INTRAMUSCULAR | Status: DC | PRN
  Administered 2017-03-26: 60 mg via INTRAVENOUS

## 2017-03-26 MED ORDER — PROMETHAZINE HCL 25 MG/ML IJ SOLN: 6.2500 mg | INTRAMUSCULAR | Status: DC | PRN

## 2017-03-26 MED ORDER — ASPIRIN EC 325 MG PO TBEC
325.0000 mg | DELAYED_RELEASE_TABLET | ORAL | Status: AC
  Administered 2017-03-26: 325 mg via ORAL

## 2017-03-26 MED ORDER — SODIUM CHLORIDE 0.9 % IV SOLN
INTRAVENOUS | Status: DC
  Administered 2017-03-26 – 2017-03-27 (×2): via INTRAVENOUS

## 2017-03-26 MED ORDER — TICAGRELOR 90 MG PO TABS
90.0000 mg | ORAL_TABLET | Freq: Once | ORAL | Status: AC
  Administered 2017-03-26: 90 mg via ORAL
  Filled 2017-03-26: qty 1

## 2017-03-26 MED ORDER — IOPAMIDOL (ISOVUE-300) INJECTION 61%
INTRAVENOUS | Status: AC
Start: 2017-03-26 — End: 2017-03-26
  Administered 2017-03-26: 102 mL
  Filled 2017-03-26: qty 300

## 2017-03-26 MED ORDER — PHENYLEPHRINE HCL 10 MG/ML IJ SOLN
INTRAMUSCULAR | Status: DC | PRN
  Administered 2017-03-26: 20 ug/min via INTRAVENOUS

## 2017-03-26 MED ORDER — TICAGRELOR 90 MG PO TABS
90.0000 mg | ORAL_TABLET | Freq: Two times a day (BID) | ORAL | Status: DC
  Administered 2017-03-27 – 2017-04-01 (×11): 90 mg via ORAL
  Filled 2017-03-26 (×12): qty 1

## 2017-03-26 MED ORDER — ONDANSETRON HCL 4 MG/2ML IJ SOLN
INTRAMUSCULAR | Status: DC | PRN
  Administered 2017-03-26: 4 mg via INTRAVENOUS

## 2017-03-26 MED ORDER — HEPARIN (PORCINE) IN NACL 100-0.45 UNIT/ML-% IJ SOLN
650.0000 [IU]/h | INTRAMUSCULAR | Status: DC
  Administered 2017-03-26: 650 [IU]/h via INTRAVENOUS
  Filled 2017-03-26: qty 250

## 2017-03-26 MED ORDER — DIPHENHYDRAMINE HCL 50 MG/ML IJ SOLN
50.0000 mg | Freq: Once | INTRAMUSCULAR | Status: AC
  Administered 2017-03-26: 50 mg via INTRAVENOUS
  Filled 2017-03-26: qty 1

## 2017-03-26 MED ORDER — ONDANSETRON HCL 4 MG/2ML IJ SOLN: 4.0000 mg | Freq: Four times a day (QID) | INTRAMUSCULAR | Status: DC | PRN

## 2017-03-26 MED ORDER — FENTANYL CITRATE (PF) 100 MCG/2ML IJ SOLN
INTRAMUSCULAR | Status: DC | PRN
  Administered 2017-03-26 (×2): 25 ug via INTRAVENOUS

## 2017-03-26 MED ORDER — LACTATED RINGERS IV SOLN
INTRAVENOUS | Status: DC | PRN
  Administered 2017-03-26: 10:00:00 via INTRAVENOUS

## 2017-03-26 MED ORDER — ACETAMINOPHEN 650 MG RE SUPP: 650.0000 mg | RECTAL | Status: DC | PRN

## 2017-03-26 MED ORDER — HEPARIN SODIUM (PORCINE) 1000 UNIT/ML IJ SOLN
INTRAMUSCULAR | Status: DC | PRN
  Administered 2017-03-26: 3000 [IU] via INTRAVENOUS

## 2017-03-26 MED ORDER — SODIUM CHLORIDE 0.9 % IJ SOLN
INTRAVENOUS | Status: AC | PRN
  Administered 2017-03-26: 25 ug via INTRA_ARTERIAL

## 2017-03-26 MED ORDER — NITROGLYCERIN 0.2 MG/ML ON CALL CATH LAB
INTRAVENOUS | Status: DC | PRN
  Administered 2017-03-26: 25 ug via INTRAVENOUS

## 2017-03-26 MED ORDER — HEPARIN (PORCINE) IN NACL 100-0.45 UNIT/ML-% IJ SOLN
500.0000 [IU]/h | INTRAMUSCULAR | Status: DC
  Filled 2017-03-26: qty 250

## 2017-03-26 MED ORDER — FENTANYL CITRATE (PF) 100 MCG/2ML IJ SOLN
INTRAMUSCULAR | Status: AC
  Filled 2017-03-26: qty 2

## 2017-03-26 NOTE — Progress Notes (Signed)
ANTICOAGULATION CONSULT NOTE  Pharmacy Consult for heparin Indication: Post Interventional Neuroradiology Procedure  Heparin Dosing Weight: 67.7 kg   Assessment: 25 yof s/p interventional neuroradiology procedure. Pharmacy consulted to dose heparin - currently has heparin running at 500 units/h post-op. CBC wnl. No bleed or IV line issues per discussion with RN.  Goal of Therapy:  Heparin level 0.1-0.25 units/ml Monitor platelets by anticoagulation protocol: Yes   Plan:  No bolus Increase heparin gtt to 750 units/h Heparin gtt to stop at 0700 tomorrow  Elenor Quinones, PharmD, BCPS Clinical Pharmacist Pager [phone] 03/26/2017 11:49 PM

## 2017-03-26 NOTE — Anesthesia Preprocedure Evaluation (Addendum)
Anesthesia Evaluation  Patient identified by MRN, date of birth, ID band Patient awake    Reviewed: Allergy & Precautions, NPO status , Patient's Chart, lab work & pertinent test results  Airway Mallampati: II  TM Distance: >3 FB Neck ROM: Full    Dental no notable dental hx. (+) Partial Upper, Partial Lower, Dental Advisory Given   Pulmonary neg pulmonary ROS, former smoker,    Pulmonary exam normal breath sounds clear to auscultation       Cardiovascular hypertension, + Peripheral Vascular Disease  Normal cardiovascular exam Rhythm:Regular Rate:Normal     Neuro/Psych negative neurological ROS  negative psych ROS   GI/Hepatic negative GI ROS, Neg liver ROS,   Endo/Other  Hypothyroidism   Renal/GU negative Renal ROS  negative genitourinary   Musculoskeletal negative musculoskeletal ROS (+)   Abdominal   Peds negative pediatric ROS (+)  Hematology negative hematology ROS (+)   Anesthesia Other Findings   Reproductive/Obstetrics negative OB ROS                            Anesthesia Physical Anesthesia Plan  ASA: III  Anesthesia Plan: General   Post-op Pain Management:    Induction: Intravenous  PONV Risk Score and Plan: 2 and Ondansetron and Dexamethasone  Airway Management Planned: Oral ETT  Additional Equipment: Arterial line  Intra-op Plan:   Post-operative Plan: Extubation in OR  Informed Consent: I have reviewed the patients History and Physical, chart, labs and discussed the procedure including the risks, benefits and alternatives for the proposed anesthesia with the patient or authorized representative who has indicated his/her understanding and acceptance.   Dental advisory given  Plan Discussed with: CRNA and Surgeon  Anesthesia Plan Comments:         Anesthesia Quick Evaluation

## 2017-03-26 NOTE — Progress Notes (Signed)
Patient ID: Rosslyn Pasion, female   DOB: 1941/04/16, 76 y.o.   MRN: [id number] INR. Since admission has largely remained asymptomatic,except for occasional H/A. Denies recent symptoms of visual abnormalities,vomitting,swallowing or speech difficulties,or motor weakness. Walking without difficulty.Intermittent nausea. Has been receiving aspirin and plavix for at least 10 days. P2 Y 12 today  222. Procedure again  explained to the patient.Reasons ,alternatives and potential risks have been discussed in detail with the patient and her daughter .Marland Kitchen Risks of ischemic stroke of 3 to 5 % ,injury to the the basilar artery causing dissection ,complete  occlusion, or even rupture with worsening neuro function and death. Also given the severe tortuosity of the left  vertebral artery prox ,and of the Lt VBJ access to the lesion may not be possible, and the procedure would have to be stopped. Questions were answered to her satisfaction.Rozetta Nunnery to proceed with angioplasty ,with possible stenting of prox basilar artery. Witnessed consent obtained. Will give patient brilinta 180 mg as a loading dose in view of inadequate platelet inhibition today.Marland Kitchen S.Meiah Zamudio MD.

## 2017-03-26 NOTE — Sedation Documentation (Signed)
Iodine allergy addressed during time out.  Pre treatment with benadryl, betadine washed from perineal area and groin area, reprepped with chorlhex.  No sign of rash or irritation.

## 2017-03-26 NOTE — Sedation Documentation (Signed)
Right femoral artery accessed by Deveshwar

## 2017-03-26 NOTE — Sedation Documentation (Signed)
Right femoral artery sheath pulled, manual pressure applied by Ileene Musa, RT

## 2017-03-26 NOTE — Progress Notes (Signed)
PROGRESS NOTE    Kathleen Dunn  QHU:[id number] DOB: [birth year] DOA: 03/15/2017 PCP: Hali Marry, MD   Brief Narrative: Kathleen Dunn is a 76 y.o. chemotherapy history of hypertension, hypokalemia, generalized anxiety disorder. Patient presents intermittent shaking and was found to have a acute CVA.   Assessment & Plan:   Principal Problem:   Cerebellar stroke (HCC) Active Problems:   Hypothyroidism   HYPERCHOLESTEROLEMIA   HYPERTENSION, BENIGN SYSTEMIC   GAD (generalized anxiety disorder)   Mixed hyperlipidemia   Basilar artery stenosis   Bilateral carotid artery stenosis   Cerebrovascular accident (CVA) due to thrombosis of precerebral artery (HCC)   Acute cerebellar stroke Secondary to basilar artery stenosis. CVA seen on MRI. Angiogram significant for proximal basilar artery stenosis and 95%. Patient scheduled for angiogram vascularization and stent placement. -Interventional radiology recommendations: angiogram/stent today -Neurology recommendations  -Continue aspirin and Plavix -PT/OT recommendations: None  Essential hypertension -Continue metoprolol  Hypothyroidism -Continue Synthroid  Anxiety -Continue Klonopin when necessary  Right knee pain Likely secondary to arthritis. -Continue Voltaren gel  Headache Chronic. -continue Tylenol prn -restart home Tramdol   DVT prophylaxis: Lovenox Code Status: Full code Family Communication: None at bedside Disposition Plan: Discharge pending a IR intervention   Consultants:   Interventional radiology  Neurology/stroke team  Procedures:   Echocardiogram: Grade 1 diastolic dysfunction, EF of 60-65%, moderate LVH  Antimicrobials:  None    Subjective: Afebrile.  Objective: Vitals:   03/26/17 0613 03/26/17 0820 03/26/17 0828 03/26/17 0904  BP: (!) 141/62  (!) 139/56 (!) 125/56  Pulse: 72  74   Resp: 18     Temp: 98.3 F (36.8 C)  97.9 F (36.6 C)   TempSrc:      SpO2: 94% 98%  96%   Weight:      Height:        Intake/Output Summary (Last 24 hours) at 03/26/17 1121 Last data filed at 03/26/17 1100  Gross per 24 hour  Intake             1415 ml  Output             2400 ml  Net             -985 ml   Filed Weights   03/15/17 0726  Weight: 86.2 kg (190 lb)    Examination:  General exam: calm and comfortable Respiratory system: unlabored Gastrointestinal system: nondistended Central nervous system: Alert and oriented. Skin: No cyanosis. No rashes Psychiatry: Judgement and insight appear normal. Anxious with normal affect.     Data Reviewed: I have personally reviewed following labs and imaging studies  CBC:  Recent Labs Lab 03/20/17 0823 03/21/17 1352 03/22/17 0335 03/26/17 0605  WBC 6.1 6.9 8.0 8.6  NEUTROABS  --   --   --  5.3  HGB 12.3 13.0 12.6 12.7  HCT 36.6 38.6 37.8 38.2  MCV 94.1 94.1 93.1 93.9  PLT 219 242 222 465   Basic Metabolic Panel:  Recent Labs Lab 03/20/17 0823 03/21/17 1352 03/22/17 0335 03/26/17 0605  NA 139 138 137 138  K 3.8 4.3 4.7 3.7  CL 109 105 104 102  CO2 24 25 27 27   GLUCOSE 92 122* 108* 108*  BUN 7 8 9 11   CREATININE 0.87 0.89 0.90 0.88  CALCIUM 8.1* 8.5* 8.7* 8.9   GFR: Estimated Creatinine Clearance: 56 mL/min (by C-G formula based on SCr of 0.88 mg/dL). Liver Function Tests:  Recent Labs Lab 03/20/17 6812 03/21/17 1352 03/22/17  0335 03/26/17 0605  AST 19 39 34 32  ALT 17 27 26  43  ALKPHOS 35* 43 39 42  BILITOT 0.6 1.0 0.9 1.0  PROT 5.4* 6.0* 5.9* 6.1*  ALBUMIN 3.1* 3.4* 3.2* 3.5   No results for input(s): LIPASE, AMYLASE in the last 168 hours. No results for input(s): AMMONIA in the last 168 hours. Coagulation Profile:  Recent Labs Lab 03/20/17 0823 03/26/17 0605  INR 1.03 1.03   Cardiac Enzymes: No results for input(s): CKTOTAL, CKMB, CKMBINDEX, TROPONINI in the last 168 hours. BNP (last 3 results) No results for input(s): PROBNP in the last 8760 hours. HbA1C: No  results for input(s): HGBA1C in the last 72 hours. CBG:  Recent Labs Lab 03/19/17 2101 03/26/17 0824  GLUCAP 124* 89       Radiology Studies: No results found.      Scheduled Meds: . [MAR Hold]  stroke: mapping our early stages of recovery book   Does not apply Once  . [MAR Hold] aspirin EC  325 mg Oral Daily  . [MAR Hold] budesonide (PULMICORT) nebulizer solution  0.25 mg Nebulization BID  . chlorhexidine      . [MAR Hold] clonazePAM  1 mg Oral QHS  . [MAR Hold] clopidogrel  75 mg Oral Daily  . [MAR Hold] enoxaparin (LOVENOX) injection  40 mg Subcutaneous Q24H  . [MAR Hold] ezetimibe  10 mg Oral Daily  . [MAR Hold] famotidine  20 mg Oral BID  . iopamidol      . [MAR Hold] levothyroxine  50 mcg Oral QAC breakfast  . [MAR Hold] losartan  50 mg Oral Daily  . niMODipine  0-60 mg Oral 60 min Pre-Op  . nitroGLYCERIN      . [MAR Hold] rosuvastatin  40 mg Oral q1800   Continuous Infusions: . sodium chloride Stopped (03/26/17 0000)  . sodium chloride 75 mL/hr at 03/26/17 0001  . vancomycin       LOS: 10 days     Cordelia Poche, MD [facility] 03/26/2017, 11:21 AM Pager: [phone]  If 7PM-7AM, please contact night-coverage [email or web address] Password [facility] 03/26/2017, 11:21 AM

## 2017-03-26 NOTE — Anesthesia Postprocedure Evaluation (Signed)
Anesthesia Post Note  Patient: Kathleen Dunn  Procedure(s) Performed: Procedure(s) (LRB): RADIOLOGY WITH ANESTHESIA STENTING (N/A)     Patient location during evaluation: PACU Anesthesia Type: General Level of consciousness: awake and alert Pain management: pain level controlled Vital Signs Assessment: post-procedure vital signs reviewed and stable Respiratory status: spontaneous breathing, nonlabored ventilation, respiratory function stable and patient connected to nasal cannula oxygen Cardiovascular status: blood pressure returned to baseline and stable Postop Assessment: no signs of nausea or vomiting Anesthetic complications: no    Last Vitals:  Vitals:   03/26/17 1359 03/26/17 1404  BP:  110/64  Pulse:    Resp:  14  Temp: (!) 36.2 C     Last Pain:  Vitals:   03/26/17 1359  TempSrc:   PainSc: 0-No pain                 Kailer Heindel S

## 2017-03-26 NOTE — Progress Notes (Signed)
ANTICOAGULATION CONSULT NOTE  Pharmacy Consult for heparin Indication: Post Interventional Neuroradiology Procedure  Heparin Dosing Weight: 67.7 kg   Assessment: 53 yof s/p interventional neuroradiology procedure. Pharmacy consulted to dose heparin - currently has heparin running at 500 units/h post-op. CBC wnl. No bleed or IV line issues per discussion with RN.  Goal of Therapy:  Heparin level 0.1-0.25 units/ml Monitor platelets by anticoagulation protocol: Yes   Plan:  No bolus Increase heparin at 650 units/h 8h heparin level Daily heparin level/CBC Monitor s/sx bleeding Off at 7AM per protocol  Elicia Lamp, PharmD, BCPS Clinical Pharmacist 03/26/2017 1:29 PM

## 2017-03-26 NOTE — Transfer of Care (Signed)
Immediate Anesthesia Transfer of Care Note  Patient: Kathleen Dunn  Procedure(s) Performed: Procedure(s): RADIOLOGY WITH ANESTHESIA STENTING (N/A)  Patient Location: PACU  Anesthesia Type:General  Level of Consciousness: drowsy, lethargic and responds to stimulation  Airway & Oxygen Therapy: Patient Spontanous Breathing and Patient connected to face mask oxygen  Post-op Assessment: Report given to RN and Patient moving all extremities X 4  Post vital signs: Reviewed and stable  Last Vitals:  Vitals:   03/26/17 0828 03/26/17 0904  BP: (!) 139/56 (!) 125/56  Pulse: 74   Resp:    Temp: 36.6 C     Last Pain:  Vitals:   03/25/17 1403  TempSrc: Oral  PainSc:       Patients Stated Pain Goal: 1 (41/66/06 3016)  Complications: No apparent anesthesia complications

## 2017-03-26 NOTE — Sedation Documentation (Signed)
Report given to PACU staff, neuro status, groin and pulses reviewed with PACU RN.  Heparin and BP parameters reviewed.

## 2017-03-26 NOTE — Care Management Note (Signed)
Case Management Note  Patient Details  Name: Kameryn Davern MRN: [id number] Date of Birth: Aug 05, 1941  Subjective/Objective:     Admitted with cerebellar stroke /  basilar artery stenosis.          Riley Lam (Daughter) Mylo Red (Daughter)      [phone]             PCP: Beatrice Lecher  Action/Plan: Patient scheduled for angiogram vascularization and stent placement 7/11.... CM to f/u with disposition needs.   Expected Discharge Date:                  Expected Discharge Plan:  Home/Self Care  In-House Referral:     Discharge planning Services  CM Consult  Post Acute Care Choice:    Choice offered to:     DME Arranged:    DME Agency:     HH Arranged:    HH Agency:     Status of Service:  In process, will continue to follow  If discussed at Long Length of Stay Meetings, dates discussed:    Additional Comments:  Sharin Mons, RN 03/26/2017, 2:44 PM

## 2017-03-26 NOTE — Sedation Documentation (Signed)
Manual pressure still being held by Ileene Musa, RT

## 2017-03-26 NOTE — Progress Notes (Signed)
PT Cancellation Note  Patient Details Name: Kathleen Dunn MRN: [id number] DOB: [birth year]   Cancelled Treatment:    Reason Eval/Treat Not Completed: Patient at procedure or test/unavailable.  Pt has been in angioplasty and recovery most of the day.  Will see pt as able 7/12. 03/26/2017  Donnella Sham, PT [phone]  (pager)   [name] [name] 03/26/2017, 3:21 PM

## 2017-03-26 NOTE — Anesthesia Procedure Notes (Signed)
Procedure Name: Intubation Date/Time: 03/26/2017 10:31 AM Performed by: Barrington Ellison Pre-anesthesia Checklist: Patient identified, Emergency Drugs available, Suction available and Patient being monitored Patient Re-evaluated:Patient Re-evaluated prior to inductionOxygen Delivery Method: Circle System Utilized Preoxygenation: Pre-oxygenation with 100% oxygen Intubation Type: IV induction Ventilation: Mask ventilation without difficulty Laryngoscope Size: Mac and 3 Grade View: Grade I Tube type: Subglottic suction tube Tube size: 7.0 mm Number of attempts: 1 Airway Equipment and Method: Stylet and Oral airway Placement Confirmation: ETT inserted through vocal cords under direct vision,  positive ETCO2 and breath sounds checked- equal and bilateral Secured at: 21 cm Tube secured with: Tape Dental Injury: Teeth and Oropharynx as per pre-operative assessment

## 2017-03-26 NOTE — Progress Notes (Signed)
Referring Physician(s):  Dr. Kerney Elbe  Supervising Physician: [name]  Patient Status:  [facility] - In-pt  Chief Complaint:  Basilar artery stenosis  Subjective: Patient seen in short stay with anesthesia.  Dr. Estanislado Pandy has discussed case with patient who is ready to proceed with possible intervention of basilar artery stenosis.   Allergies: Codeine; Hydrocodone-acetaminophen; Morphine; Lipitor [atorvastatin]; Morphine and related; Penicillins; Albuterol; Erythromycin; Hydroxyzine pamoate; Iodine; Simvastatin; and Sulfonamide derivatives  Medications: Prior to Admission medications   Medication Sig Start Date End Date Taking? Authorizing Provider  acetaminophen (TYLENOL) 500 MG tablet Take 250-500 mg by mouth every 6 (six) hours as needed for headache (pain).   Yes [provider]  acetic acid-hydrocortisone (VOSOL-HC) otic solution Place 3 drops into both ears 3 (three) times daily. Patient taking differently: Place 3 drops into both ears 3 (three) times daily as needed (itching).  06/21/16  Yes Hali Marry, MD  albuterol (PROVENTIL HFA;VENTOLIN HFA) 108 (90 Base) MCG/ACT inhaler Inhale 1 puff into the lungs every 6 (six) hours as needed for wheezing or shortness of breath.   Yes [provider]  Betamethasone Valerate 0.12 % foam Apply 1 application topically daily as needed (scalp itching).   Yes [provider]  Cholecalciferol (VITAMIN D3) 3000 units TABS Take 3,000 Units by mouth daily.   Yes [provider]  clonazePAM (KLONOPIN) 1 MG tablet Take 1 tablet (1 mg total) by mouth 2 (two) times daily as needed for anxiety. Patient taking differently: Take 0.5-1 mg by mouth See admin instructions. Take 1/2 - 1 tablet (0.5 mg-1 mg) by mouth daily at bedtime, may also take 1/2 tablet (0.5 mg) during the day or during the night as needed for anxiety 04/01/16  Yes Metheney, Rene Kocher, MD  DEPO-ESTRADIOL 5 MG/ML injection Inject 1 mL  (5 mg total) into the muscle every 28 (twenty-eight) days. Patient taking differently: Inject 2.5 mg into the muscle every 14 (fourteen) days. Last injection 03/05/17 06/21/16  Yes Hali Marry, MD  [facility] SEAL PO Take 1 capsule by mouth 2 (two) times daily as needed (congestion).   Yes [provider]  gabapentin (NEURONTIN) 100 MG capsule Take 100 mg by mouth at bedtime as needed (knee pain/ neuropathy).  12/21/16  Yes [provider]  ketoconazole (NIZORAL) 2 % shampoo APPLY TO AFFECTED AREA TWICE A WEEK Patient taking differently: Apply 1 application topically See admin instructions. Use to shampoo hair up to twice a week as needed for itching 11/03/15  Yes Hali Marry, MD  levothyroxine (SYNTHROID, LEVOTHROID) 50 MCG tablet Take 1 tablet (50 mcg total) by mouth daily. NEED FOLLOW UP VISIT FOR MORE REFILLS 08/06/16  Yes Hali Marry, MD  losartan (COZAAR) 25 MG tablet Take 1 tablet (25 mg total) by mouth daily. NEED FOLLOW UP VISIT FOR MORE REFILLS Patient taking differently: Take 25 mg by mouth at bedtime. NEED FOLLOW UP VISIT FOR MORE REFILLS 06/12/16  Yes Hali Marry, MD  lovastatin (MEVACOR) 40 MG tablet Take 40 mg by mouth at bedtime.   Yes [provider]  meloxicam (MOBIC) 7.5 MG tablet Take 3.75 mg by mouth daily as needed (joint pain).   Yes [provider]  metoprolol tartrate (LOPRESSOR) 25 MG tablet Take 1 tablet (25 mg total) by mouth daily. Patient taking differently: Take 25 mg by mouth at bedtime.  11/03/15  Yes Hali Marry, MD  mometasone (NASONEX) 50 MCG/ACT nasal spray Place 2 sprays into the  nose daily as needed (congestion).   Yes [provider]  pantoprazole (PROTONIX) 40 MG tablet Take 40 mg by mouth at bedtime.  02/17/17  Yes [provider]  predniSONE (DELTASONE) 5 MG tablet Take 1.25-2.5 mg by mouth 3 (three) times daily as needed (congestion).    Yes [provider]  QVAR 40 MCG/ACT inhaler USE 2 INHALATIONS TWICE A DAY Patient taking differently: USE 2 INHALATIONS TWICE A DAY AS NEEDED FOR SHORTNESS OF BREATH/WHEEZING 07/22/14  Yes Hali Marry, MD  ranitidine (ZANTAC) 150 MG capsule TAKE 1 CAPSULE TWICE A DAY AS NEEDED FOR HEARTBURN Patient taking differently: Take 150 mg by mouth daily as needed for heartburn.  11/03/15  Yes Hali Marry, MD  traMADol (ULTRAM) 50 MG tablet Take 1 tablet (50 mg total) by mouth every 12 (twelve) hours as needed (knee pain). 02/21/16  Yes Hali Marry, MD  benzonatate (TESSALON) 100 MG capsule Take 1-2 capsules (100-200 mg total) by mouth 3 (three) times daily as needed for cough. Patient not taking: Reported on 03/15/2017 04/01/16   Hali Marry, MD  Cholecalciferol (VITAMIN D3) 1000 units CAPS Take 1 capsule (1,000 Units total) by mouth daily. Patient not taking: Reported on 03/15/2017 11/03/15   Hali Marry, MD  triamterene-hydrochlorothiazide (DYAZIDE) 37.5-25 MG capsule TAKE 1 CAPSULE DAILY Patient not taking: Reported on 03/15/2017 03/14/16   Hali Marry, MD     Vital Signs: BP (!) 125/56   Pulse 74   Temp 97.9 F (36.6 C)   Resp 18   Ht 5\' 1"  (1.549 m)   Wt 190 lb (86.2 kg)   SpO2 96%   BMI 35.90 kg/m   Physical Exam  Constitutional: She is oriented to person, place, and time. She appears well-developed. No distress.  Cardiovascular: Normal rate and regular rhythm.   Pulmonary/Chest: Effort normal. No respiratory distress.  Neurological: She is alert and oriented to person, place, and time.  Skin: Skin is warm and dry.  Psychiatric: She has a normal mood and affect. Her behavior is normal. Judgment and thought content normal.  Nursing note and vitals reviewed.    Imaging: No results found.  Labs:  CBC:  Recent Labs  03/20/17 0823 03/21/17 1352 03/22/17 0335 03/26/17 0605  WBC 6.1 6.9 8.0 8.6  HGB 12.3 13.0 12.6 12.7  HCT 36.6  38.6 37.8 38.2  PLT 219 242 222 243    COAGS:  Recent Labs  03/17/17 0630 03/20/17 0823 03/26/17 0605  INR 1.09 1.03 1.03  APTT  --   --  35    BMP:  Recent Labs  03/20/17 0823 03/21/17 1352 03/22/17 0335 03/26/17 0605  NA 139 138 137 138  K 3.8 4.3 4.7 3.7  CL 109 105 104 102  CO2 24 25 27 27   GLUCOSE 92 122* 108* 108*  BUN 7 8 9 11   CALCIUM 8.1* 8.5* 8.7* 8.9  CREATININE 0.87 0.89 0.90 0.88  GFRNONAA >60 >60 >60 >60  GFRAA >60 >60 >60 >60    LIVER FUNCTION TESTS:  Recent Labs  03/20/17 0823 03/21/17 1352 03/22/17 0335 03/26/17 0605  BILITOT 0.6 1.0 0.9 1.0  AST 19 39 34 32  ALT 17 27 26  43  ALKPHOS 35* 43 39 42  PROT 5.4* 6.0* 5.9* 6.1*  ALBUMIN 3.1* 3.4* 3.2* 3.5    Assessment and Plan: CVA, basilar artery stenosis Patient is s/p angiogram 03/20/17 which showed 95% stenosis of proximal basilar artery, 50% stenosis of R  ICA and R ECA origin.  Dr. Estanislado Pandy has discussed results of angiogram with patient and family who are agreeable to proceed with intervention.  Her P2Y12 today is 222.  She has been on Plavix. She is given Brilinta 180 mg this AM.  She will also receive 50 mg IV benadryl prior to procedure.  Risks and benefits discussed with the patient including, but not limited to bleeding, infection, vascular injury or contrast induced renal failure. All of the patient's questions were answered, patient is agreeable to proceed. Consent signed and in chart.  Electronically Signed: Docia Barrier, PA 03/26/2017, 10:08 AM   I spent a total of 15 Minutes at the the patient's bedside AND on the patient's hospital floor or unit, greater than 50% of which was counseling/coordinating care for basilar artery stenosis.

## 2017-03-26 NOTE — Procedures (Signed)
S/P basilar artery angioplasty with residual patency of 65  to70 %

## 2017-03-26 NOTE — Progress Notes (Signed)
Referring Physician(s): Dr Royal Hawthorn  Supervising Physician: [name]  Patient Status:  [facility] - In-pt  Chief Complaint:  S/P basilar artery angioplasty with residual patency of 65  to70 %   Subjective:  Pt just now in [place] name; dob; place  Answering questions appropriately Following all commands   Allergies: Codeine; Hydrocodone-acetaminophen; Morphine; Lipitor [atorvastatin]; Morphine and related; Penicillins; Albuterol; Erythromycin; Hydroxyzine pamoate; Iodine; Simvastatin; and Sulfonamide derivatives  Medications: Prior to Admission medications   Medication Sig Start Date End Date Taking? Authorizing Provider  acetaminophen (TYLENOL) 500 MG tablet Take 250-500 mg by mouth every 6 (six) hours as needed for headache (pain).   Yes [provider]  acetic acid-hydrocortisone (VOSOL-HC) otic solution Place 3 drops into both ears 3 (three) times daily. Patient taking differently: Place 3 drops into both ears 3 (three) times daily as needed (itching).  06/21/16  Yes Hali Marry, MD  albuterol (PROVENTIL HFA;VENTOLIN HFA) 108 (90 Base) MCG/ACT inhaler Inhale 1 puff into the lungs every 6 (six) hours as needed for wheezing or shortness of breath.   Yes [provider]  Betamethasone Valerate 0.12 % foam Apply 1 application topically daily as needed (scalp itching).   Yes [provider]  Cholecalciferol (VITAMIN D3) 3000 units TABS Take 3,000 Units by mouth daily.   Yes [provider]  clonazePAM (KLONOPIN) 1 MG tablet Take 1 tablet (1 mg total) by mouth 2 (two) times daily as needed for anxiety. Patient taking differently: Take 0.5-1 mg by mouth See admin instructions. Take 1/2 - 1 tablet (0.5 mg-1 mg) by mouth daily at bedtime, may also take 1/2 tablet (0.5 mg) during the day or during the night as needed for anxiety 04/01/16  Yes Metheney, Rene Kocher, MD  DEPO-ESTRADIOL 5 MG/ML injection Inject 1 mL (5 mg  total) into the muscle every 28 (twenty-eight) days. Patient taking differently: Inject 2.5 mg into the muscle every 14 (fourteen) days. Last injection 03/05/17 06/21/16  Yes Hali Marry, MD  [facility] SEAL PO Take 1 capsule by mouth 2 (two) times daily as needed (congestion).   Yes [provider]  gabapentin (NEURONTIN) 100 MG capsule Take 100 mg by mouth at bedtime as needed (knee pain/ neuropathy).  12/21/16  Yes [provider]  ketoconazole (NIZORAL) 2 % shampoo APPLY TO AFFECTED AREA TWICE A WEEK Patient taking differently: Apply 1 application topically See admin instructions. Use to shampoo hair up to twice a week as needed for itching 11/03/15  Yes Hali Marry, MD  levothyroxine (SYNTHROID, LEVOTHROID) 50 MCG tablet Take 1 tablet (50 mcg total) by mouth daily. NEED FOLLOW UP VISIT FOR MORE REFILLS 08/06/16  Yes Hali Marry, MD  losartan (COZAAR) 25 MG tablet Take 1 tablet (25 mg total) by mouth daily. NEED FOLLOW UP VISIT FOR MORE REFILLS Patient taking differently: Take 25 mg by mouth at bedtime. NEED FOLLOW UP VISIT FOR MORE REFILLS 06/12/16  Yes Hali Marry, MD  lovastatin (MEVACOR) 40 MG tablet Take 40 mg by mouth at bedtime.   Yes [provider]  meloxicam (MOBIC) 7.5 MG tablet Take 3.75 mg by mouth daily as needed (joint pain).   Yes [provider]  metoprolol tartrate (LOPRESSOR) 25 MG tablet Take 1 tablet (25 mg total) by mouth daily. Patient taking differently: Take 25 mg by mouth at bedtime.  11/03/15  Yes Hali Marry, MD  mometasone (NASONEX) 50 MCG/ACT nasal spray Place 2 sprays into  the nose daily as needed (congestion).   Yes [provider]  pantoprazole (PROTONIX) 40 MG tablet Take 40 mg by mouth at bedtime.  02/17/17  Yes [provider]  predniSONE (DELTASONE) 5 MG tablet Take 1.25-2.5 mg by mouth 3 (three) times daily as needed (congestion).    Yes [provider]  QVAR 40 MCG/ACT inhaler USE 2 INHALATIONS TWICE A DAY Patient taking differently: USE 2 INHALATIONS TWICE A DAY AS NEEDED FOR SHORTNESS OF BREATH/WHEEZING 07/22/14  Yes Hali Marry, MD  ranitidine (ZANTAC) 150 MG capsule TAKE 1 CAPSULE TWICE A DAY AS NEEDED FOR HEARTBURN Patient taking differently: Take 150 mg by mouth daily as needed for heartburn.  11/03/15  Yes Hali Marry, MD  traMADol (ULTRAM) 50 MG tablet Take 1 tablet (50 mg total) by mouth every 12 (twelve) hours as needed (knee pain). 02/21/16  Yes Hali Marry, MD  benzonatate (TESSALON) 100 MG capsule Take 1-2 capsules (100-200 mg total) by mouth 3 (three) times daily as needed for cough. Patient not taking: Reported on 03/15/2017 04/01/16   Hali Marry, MD  Cholecalciferol (VITAMIN D3) 1000 units CAPS Take 1 capsule (1,000 Units total) by mouth daily. Patient not taking: Reported on 03/15/2017 11/03/15   Hali Marry, MD  triamterene-hydrochlorothiazide (DYAZIDE) 37.5-25 MG capsule TAKE 1 CAPSULE DAILY Patient not taking: Reported on 03/15/2017 03/14/16   Hali Marry, MD     Vital Signs: BP 110/64   Pulse (!) 46   Temp (!) 97.2 F (36.2 C)   Resp 14   Ht 5\' 1"  (1.549 m)   Wt 190 lb 0.6 oz (86.2 kg)   SpO2 92%   BMI 35.91 kg/m   Physical Exam  Constitutional: She is oriented to person, place, and time.  Face symmetrical Tongue is midline Shows teeth Smile = Puffs cheeks  Abdominal: Soft.  Musculoskeletal: Normal range of motion.  Moves all 4s Good strength and sensation =B Good grip   Neurological: She is alert and oriented to person, place, and time.  Sluggish from anesthesia  Skin: Skin is warm and dry.  Right groin is clean and dry NT No bleeding   Psychiatric: She has a normal mood and affect.  Nursing note and vitals reviewed.   Imaging: No results found.  Labs:  CBC:  Recent Labs  03/20/17 0823 03/21/17 1352 03/22/17 0335 03/26/17 0605   WBC 6.1 6.9 8.0 8.6  HGB 12.3 13.0 12.6 12.7  HCT 36.6 38.6 37.8 38.2  PLT 219 242 222 243    COAGS:  Recent Labs  03/17/17 0630 03/20/17 0823 03/26/17 0605  INR 1.09 1.03 1.03  APTT  --   --  35    BMP:  Recent Labs  03/20/17 0823 03/21/17 1352 03/22/17 0335 03/26/17 0605  NA 139 138 137 138  K 3.8 4.3 4.7 3.7  CL 109 105 104 102  CO2 24 25 27 27   GLUCOSE 92 122* 108* 108*  BUN 7 8 9 11   CALCIUM 8.1* 8.5* 8.7* 8.9  CREATININE 0.87 0.89 0.90 0.88  GFRNONAA >60 >60 >60 >60  GFRAA >60 >60 >60 >60    LIVER FUNCTION TESTS:  Recent Labs  03/20/17 0823 03/21/17 1352 03/22/17 0335 03/26/17 0605  BILITOT 0.6 1.0 0.9 1.0  AST 19 39 34 32  ALT 17 27 26  43  ALKPHOS 35* 43 39 42  PROT 5.4* 6.0* 5.9* 6.1*  ALBUMIN 3.1* 3.4* 3.2* 3.5    Assessment and  Plan:  Basilar artery angioplasty Resting and waking in PACU Appropriate Art line in 140s---use this as pressure reading per Dr Estanislado Pandy Will follow  Electronically Signed: Lavonia Drafts, PA-C 03/26/2017, 2:26 PM   I spent a total of 15 Minutes at the the patient's bedside AND on the patient's [facility] floor or unit, greater than 50% of which was counseling/coordinating care for basilar artery angioplasty

## 2017-03-26 NOTE — Progress Notes (Signed)
Per Dr. Estanislado Pandy, pts. blood pressure can fluctuate between 140-150, via ART line. If pressure sustains above 150, then titrate cleviprex. Will continue to monitor. Lianne Bushy RN BSN

## 2017-03-26 NOTE — Anesthesia Procedure Notes (Signed)
Arterial Line Insertion Start/End7/07/2017 9:25 AM, 03/26/2017 9:39 AM Performed by: Carney Living, CRNA  Patient location: Pre-op. Lidocaine 1% used for infiltration and patient sedated Right, radial was placed Catheter size: 20 G Hand hygiene performed  and maximum sterile barriers used   Attempts: 2 Procedure performed without using ultrasound guided technique. Following insertion, dressing applied and Biopatch. Post procedure assessment: normal and unchanged  Patient tolerated the procedure well with no immediate complications.

## 2017-03-26 NOTE — Consult Note (Signed)
           [facility] CM Primary Care Navigator  03/26/2017  Kathleen [facility] May 01, 1941 [id number]   Attempt to go back and see patient again at the bedside to identify possible discharge needs but she was off the unit- in the OR for angiogram and stent placement per staff.  Noted that patient was transferred to [address] after the procedure.  Will try to see patient at another time when out of ICU.   For questions, please contact:  Dannielle Huh, BSN, RN- [facility] Primary Care Navigator  Telephone: [phone] [place]

## 2017-03-27 ENCOUNTER — Encounter (HOSPITAL_COMMUNITY): Payer: Self-pay | Admitting: Interventional Radiology

## 2017-03-27 LAB — CBC WITH DIFFERENTIAL/PLATELET
BASOS PCT: 0 %
Basophils Absolute: 0 10*3/uL (ref 0.0–0.1)
Eosinophils Absolute: 0.4 10*3/uL (ref 0.0–0.7)
Eosinophils Relative: 4 %
HEMATOCRIT: 37.2 % (ref 36.0–46.0)
HEMOGLOBIN: 12.2 g/dL (ref 12.0–15.0)
Lymphocytes Relative: 21 %
Lymphs Abs: 1.7 10*3/uL (ref 0.7–4.0)
MCH: 31.4 pg (ref 26.0–34.0)
MCHC: 32.8 g/dL (ref 30.0–36.0)
MCV: 95.6 fL (ref 78.0–100.0)
MONOS PCT: 7 %
Monocytes Absolute: 0.6 10*3/uL (ref 0.1–1.0)
NEUTROS ABS: 5.7 10*3/uL (ref 1.7–7.7)
NEUTROS PCT: 68 %
Platelets: 231 10*3/uL (ref 150–400)
RBC: 3.89 MIL/uL (ref 3.87–5.11)
RDW: 13.9 % (ref 11.5–15.5)
WBC: 8.4 10*3/uL (ref 4.0–10.5)

## 2017-03-27 LAB — BASIC METABOLIC PANEL
ANION GAP: 9 (ref 5–15)
BUN: 6 mg/dL (ref 6–20)
CHLORIDE: 104 mmol/L (ref 101–111)
CO2: 24 mmol/L (ref 22–32)
CREATININE: 0.78 mg/dL (ref 0.44–1.00)
Calcium: 8.7 mg/dL — ABNORMAL LOW (ref 8.9–10.3)
GFR calc non Af Amer: >60 mL/min (ref >60)
Glucose, Bld: 109 mg/dL — ABNORMAL HIGH (ref 65–99)
Potassium: 3.8 mmol/L (ref 3.5–5.1)
Sodium: 137 mmol/L (ref 135–145)

## 2017-03-27 LAB — HEPARIN LEVEL (UNFRACTIONATED): Heparin Unfractionated: <0.1 IU/mL — ABNORMAL LOW (ref 0.30–0.70)

## 2017-03-27 NOTE — Progress Notes (Addendum)
Patient ID: Kathleen [facility], female   DOB: April 14, 1941, 76 y.o.   MRN: [id number]   Dr Estanislado Pandy performed basilar artery angioplasty 7/11 Stable from IR standpoint  May transfer back to [facility] service IR will follow Plan for follow up with Dr Estanislado Pandy 2 weeks We will have follow up plan in chart and she will hear from scheduler for time and date

## 2017-03-27 NOTE — Progress Notes (Signed)
PROGRESS NOTE    Kathleen [facility]  WPY:[id number] DOB: 07-Jan-1941 DOA: 03/15/2017 PCP: Hali Marry, MD   Brief Narrative: Kathleen [facility] is a 76 y.o. chemotherapy history of hypertension, hypokalemia, generalized anxiety disorder. Patient presents intermittent shaking and was found to have a acute CVA.   Assessment & Plan:   Principal Problem:   Cerebellar stroke (HCC) Active Problems:   Hypothyroidism   HYPERCHOLESTEROLEMIA   HYPERTENSION, BENIGN SYSTEMIC   GAD (generalized anxiety disorder)   Mixed hyperlipidemia   Basilar artery stenosis   Bilateral carotid artery stenosis   Cerebrovascular accident (CVA) due to thrombosis of precerebral artery (HCC)   Basilar artery stenosis/occlusion with infarction ([place])   Acute cerebellar stroke Secondary to basilar artery stenosis. CVA seen on MRI. Angiogram significant for proximal basilar artery stenosis and 95%. S/p basilar artery angioplasty; residual patency of 65-70% -Interventional radiology recommendations -Neurology recommendations -Continue aspirin and Plavix -PT/OT recommendations: None  Essential hypertension -Continue metoprolol  Hypothyroidism -Continue Synthroid  Anxiety -Continue Klonopin when necessary  Right knee pain Secondary to arthritis. -Continue Voltaren gel  Headache Chronic. -continue Tylenol prn -restart home Tramdol   DVT prophylaxis: Lovenox Code Status: Full code Family Communication: None at bedside Disposition Plan: Discharge pending a IR/neuro   Consultants:   Interventional radiology  Neurology/stroke team  Procedures:   Echocardiogram: Grade 1 diastolic dysfunction, EF of 60-65%, moderate LVH  Antimicrobials:  None    Subjective: Afebrile overnight  Objective: Vitals:   03/27/17 0800 03/27/17 0832 03/27/17 0900 03/27/17 1000  BP:   (!) 116/44 (!) 134/49  Pulse:   68 73  Resp:   19 19  Temp: 97.8 F (36.6 C)     TempSrc: Axillary     SpO2:  96%  98% 96%  Weight:      Height:        Intake/Output Summary (Last 24 hours) at 03/27/17 1050 Last data filed at 03/27/17 1000  Gross per 24 hour  Intake          2291.66 ml  Output             4475 ml  Net         -2183.34 ml   Filed Weights   03/15/17 0726 03/26/17 1300 03/26/17 1730  Weight: 86.2 kg (190 lb) 86.2 kg (190 lb 0.6 oz) 85.5 kg (188 lb 7.9 oz)    Examination:  General exam: Comfortable. No distress Respiratory system: Unlabored Gastrointestinal system: Nondistended Skin: No cyanosis. No rashes    Data Reviewed: I have personally reviewed following labs and imaging studies  CBC:  Recent Labs Lab 03/21/17 1352 03/22/17 0335 03/26/17 0605 03/27/17 0634  WBC 6.9 8.0 8.6 8.4  NEUTROABS  --   --  5.3 5.7  HGB 13.0 12.6 12.7 12.2  HCT 38.6 37.8 38.2 37.2  MCV 94.1 93.1 93.9 95.6  PLT 242 222 243 053   Basic Metabolic Panel:  Recent Labs Lab 03/21/17 1352 03/22/17 0335 03/26/17 0605 03/27/17 0634  NA 138 137 138 137  K 4.3 4.7 3.7 3.8  CL 105 104 102 104  CO2 25 27 27 24   GLUCOSE 122* 108* 108* 109*  BUN 8 9 11 6   CREATININE 0.89 0.90 0.88 0.78  CALCIUM 8.5* 8.7* 8.9 8.7*   GFR: Estimated Creatinine Clearance: 61.3 mL/min (by C-G formula based on SCr of 0.78 mg/dL). Liver Function Tests:  Recent Labs Lab 03/21/17 1352 03/22/17 0335 03/26/17 0605  AST 39 34 32  ALT 27 26  43  ALKPHOS 43 39 42  BILITOT 1.0 0.9 1.0  PROT 6.0* 5.9* 6.1*  ALBUMIN 3.4* 3.2* 3.5   No results for input(s): LIPASE, AMYLASE in the last 168 hours. No results for input(s): AMMONIA in the last 168 hours. Coagulation Profile:  Recent Labs Lab 03/26/17 0605  INR 1.03   Cardiac Enzymes: No results for input(s): CKTOTAL, CKMB, CKMBINDEX, TROPONINI in the last 168 hours. BNP (last 3 results) No results for input(s): PROBNP in the last 8760 hours. HbA1C: No results for input(s): HGBA1C in the last 72 hours. CBG:  Recent Labs Lab 03/26/17 0824  GLUCAP 89        Radiology Studies: No results found.      Scheduled Meds: .  stroke: mapping our early stages of recovery book   Does not apply Once  . aspirin EC  325 mg Oral Daily  . budesonide (PULMICORT) nebulizer solution  0.25 mg Nebulization BID  . clonazePAM  1 mg Oral QHS  . enoxaparin (LOVENOX) injection  40 mg Subcutaneous Q24H  . ezetimibe  10 mg Oral Daily  . famotidine  20 mg Oral BID  . levothyroxine  50 mcg Oral QAC breakfast  . losartan  50 mg Oral Daily  . rosuvastatin  40 mg Oral q1800  . ticagrelor  90 mg Oral BID   Continuous Infusions: . sodium chloride Stopped (03/26/17 0000)  . sodium chloride 75 mL/hr at 03/27/17 1000  . clevidipine Stopped (03/27/17 0730)     LOS: 11 days     Cordelia Poche, MD [facility] 03/27/2017, 10:50 AM Pager: [phone]  If 7PM-7AM, please contact night-coverage [email or web address] Password [email or web address] 03/27/2017, 10:50 AM

## 2017-03-27 NOTE — Progress Notes (Signed)
Physical Therapy Treatment Patient Details Name: Kathleen Dunn MRN: [id number] DOB: 04/17/1941 Today's Date: 03/27/2017    History of Present Illness Pt is a 76 yo female admitted through ED on 03/15/17 due to an episode of L arm twisting, dizziness and fatigue. Pt was diagnosed with subacute cerebellar stroke. PMH significant for asthma, HTN, thyroid disease, anxiety.      PT Comments    Pt progressing towards physical therapy goals. Perseverated on finding her phone charger and had difficulty focusing on tasks in room as she was searching for it. Continues to appear unsteady, however no assist was required to recover throughout gait training. Will continue to follow and progress as able per POC.    Follow Up Recommendations  Home health PT     Equipment Recommendations  Cane    Recommendations for Other Services       Precautions / Restrictions Precautions Precautions: Fall Restrictions Weight Bearing Restrictions: No    Mobility  Bed Mobility Overal bed mobility: Modified Independent             General bed mobility comments: use of rail  Transfers Overall transfer level: Needs assistance Equipment used: None Transfers: Sit to/from Stand Sit to Stand: Supervision         General transfer comment: for safety with IV  Ambulation/Gait Ambulation/Gait assistance: Min guard Ambulation Distance (Feet): 250 Feet Assistive device: None (Held to IV pole for support) Gait Pattern/deviations: Step-through pattern;Decreased stride length;Trendelenburg Gait velocity: prefers slower, but able to speed up significantly to cue. Gait velocity interpretation: at or above normal speed for age/gender General Gait Details: Pt distracted and unsteady however does not require assist to recover. Held to IV pole throughout.    Stairs            Wheelchair Mobility    Modified Rankin (Stroke Patients Only) Modified Rankin (Stroke Patients Only) Pre-Morbid Rankin  Score: No symptoms Modified Rankin: Moderately severe disability     Balance Overall balance assessment: Needs assistance Sitting-balance support: No upper extremity supported;Feet supported Sitting balance-Leahy Scale: Normal     Standing balance support: No upper extremity supported Standing balance-Leahy Scale: Good Standing balance comment: standing balance activities in hallway with intermittent UE support including forward march, backwards walking, single limb standing, side stepping and standing hip abduction                            Cognition Arousal/Alertness: Awake/alert Behavior During Therapy: WFL for tasks assessed/performed Overall Cognitive Status: Within Functional Limits for tasks assessed                                        Exercises      General Comments        Pertinent Vitals/Pain Pain Assessment: No/denies pain    Home Living Family/patient expects to be discharged to:: Private residence Living Arrangements: Alone Available Help at Discharge: Family;Available PRN/intermittently Type of Home: Apartment Home Access: Elevator   Home Layout: One level Home Equipment: Cane - single point      Prior Function Level of Independence: Independent      Comments: Does all of her home management and drives.   PT Goals (current goals can now be found in the care plan section) Acute Rehab PT Goals Patient Stated Goal: to feel better and get back home PT Goal Formulation: With  patient Time For Goal Achievement: 03/30/17 Potential to Achieve Goals: Good Progress towards PT goals: Progressing toward goals    Frequency    Min 3X/week      PT Plan Current plan remains appropriate    Co-evaluation              AM-PAC PT "6 Clicks" Daily Activity  Outcome Measure  Difficulty turning over in bed (including adjusting bedclothes, sheets and blankets)?: None Difficulty moving from lying on back to sitting on the  side of the bed? : None Difficulty sitting down on and standing up from a chair with arms (e.g., wheelchair, bedside commode, etc,.)?: A Little Help needed moving to and from a bed to chair (including a wheelchair)?: A Little Help needed walking in hospital room?: A Little Help needed climbing 3-5 steps with a railing? : A Little 6 Click Score: 20    End of Session Equipment Utilized During Treatment: Gait belt Activity Tolerance: Patient tolerated treatment well Patient left: in chair;with nursing/sitter in room Nurse Communication: Mobility status PT Visit Diagnosis: Unsteadiness on feet (R26.81)     Time: 4709-6283 PT Time Calculation (min) (ACUTE ONLY): 41 min  Charges:  $Gait Training: 8-22 mins $Therapeutic Activity: 23-37 mins                    G Codes:       Kathleen Dunn, PT, DPT Acute Rehabilitation Services Pager: [phone]    Kathleen Dunn 03/27/2017, 3:27 PM

## 2017-03-27 NOTE — Progress Notes (Signed)
STROKE TEAM PROGRESS NOTE   SUBJECTIVE (INTERVAL HISTORY) The patient is alone.  No acute event overnight.  She had successful BA angioplasty to 70% diameter restoration No complaints today.   OBJECTIVE Temp:  [97.7 F (36.5 C)-98.2 F (36.8 C)] 98.2 F (36.8 C) (07/12 1200) Pulse Rate:  [62-80] 71 (07/12 1500) Cardiac Rhythm: Normal sinus rhythm (07/12 0800) Resp:  [11-35] 19 (07/12 1500) BP: (102-146)/(44-126) 134/64 (07/12 1500) SpO2:  [86 %-98 %] 94 % (07/12 1500) Arterial Line BP: (109-160)/(39-75) 148/57 (07/12 0900) Weight:  [188 lb 7.9 oz (85.5 kg)] 188 lb 7.9 oz (85.5 kg) (07/11 1730)  CBC:   Recent Labs Lab 03/26/17 0605 03/27/17 0634  WBC 8.6 8.4  NEUTROABS 5.3 5.7  HGB 12.7 12.2  HCT 38.2 37.2  MCV 93.9 95.6  PLT 243 621    Basic Metabolic Panel:   Recent Labs Lab 03/26/17 0605 03/27/17 0634  NA 138 137  K 3.7 3.8  CL 102 104  CO2 27 24  GLUCOSE 108* 109*  BUN 11 6  CREATININE 0.88 0.78  CALCIUM 8.9 8.7*    Lipid Panel:     Component Value Date/Time   CHOL 272 (H) 03/16/2017 0717   TRIG 783 (H) 03/16/2017 0717   TRIG 239 03/07/2009   HDL 30 (L) 03/16/2017 0717   CHOLHDL 9.1 03/16/2017 0717   VLDL UNABLE TO CALCULATE IF TRIGLYCERIDE OVER 400 mg/dL 03/16/2017 0717   LDLCALC UNABLE TO CALCULATE IF TRIGLYCERIDE OVER 400 mg/dL 03/16/2017 0717   HgbA1c:  Lab Results  Component Value Date   HGBA1C 5.8 (H) 03/16/2017   Urine Drug Screen:     Component Value Date/Time   LABOPIA NONE DETECTED 03/15/2017 0825   COCAINSCRNUR NONE DETECTED 03/15/2017 0825   LABBENZ NONE DETECTED 03/15/2017 0825   AMPHETMU NONE DETECTED 03/15/2017 0825   THCU NONE DETECTED 03/15/2017 0825   LABBARB NONE DETECTED 03/15/2017 0825    Alcohol Level No results found for: [place] I have personally reviewed the radiological images below and agree with the radiology interpretations.  Ct Head Wo Contrast 03/15/2017 Worsening of areas of low-density in the  cerebellum consistent with small acute/ subacute cerebellar infarctions. No sign of hemorrhage or mass effect.    Mr Kathleen Dunn Head/brain Wo Cm 03/15/2017 1. 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 No associated hemorrhage or mass effect.  2. 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 3. 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 4. Attenuated flow in the distal left PCA. 5. Mild to moderate for age nonspecific cerebral white matter signal changes.  6. Negative anterior circulation.   CTA head and neck  1. Advanced atherosclerosis in the head and neck. 2. Short segment atheromatous occlusion or critical stenosis in the proximal basilar. 3. Moderate atheromatous narrowing in the distal left V4 segment and mid basilar. The hypoplastic right vertebral artery that is effectively occluded at the dura. 4. 60-70% proximal right ICA and 40-50% right common carotid stenosis due to atherosclerotic plaque. 5. Known acute infarcts in the bilateral cerebellum and left occipital lobe (there is a fetal type right PCA). No hemorrhagic conversion or evidence of progression.  DSA 1.Pre occlusive 95% plus stenosis  of   prox basilar artery stenosis. 2.Approx 50 % stenosis of Rt  ICA prox,and of RT ECA at origin.  TTE - Left ventricle: The cavity size was normal. Wall thickness  was   increased in a pattern of moderate LVH. Systolic function was   normal. The estimated ejection fraction was in the range of 60%   to 65%. Wall motion was normal; there were no regional wall   motion abnormalities. Doppler parameters are consistent with   abnormal left ventricular relaxation (grade 1 diastolic   dysfunction). - Aortic valve: There was trivial regurgitation. - Pericardium, extracardiac: A trivial pericardial effusion was   identified.  EEG - A normal EEG does not exclude a  clinical diagnosis of epilepsy.  If further clinical questions remain, prolonged EEG may be helpful.  Clinical correlation is advised.   PHYSICAL EXAM  Temp:  [97.7 F (36.5 C)-98.2 F (36.8 C)] 98.2 F (36.8 C) (07/12 1200) Pulse Rate:  [62-80] 71 (07/12 1500) Resp:  [11-35] 19 (07/12 1500) BP: (102-146)/(44-126) 134/64 (07/12 1500) SpO2:  [86 %-98 %] 94 % (07/12 1500) Arterial Line BP: (109-160)/(39-75) 148/57 (07/12 0900) Weight:  [188 lb 7.9 oz (85.5 kg)] 188 lb 7.9 oz (85.5 kg) (07/11 1730)  General - Well nourished, well developed, in no apparent distress.  Ophthalmologic - Fundi not visualized due to noncooperation.  Cardiovascular - Regular rate and rhythm.  Mental Status -  Level of arousal and orientation to time, place, and person were intact. Language including expression, naming, repetition, comprehension was assessed and found intact. Fund of Knowledge was assessed and was intact.  Cranial Nerves II - XII - II - Visual field intact OU. III, IV, VI - Extraocular movements intact. V - Facial sensation intact bilaterally. VII - Facial movement intact bilaterally. VIII - Hearing & vestibular intact bilaterally. X - Palate elevates symmetrically. XI - Chin turning & shoulder shrug intact bilaterally. XII - Tongue protrusion intact.  Motor Strength - The patient's strength was normal in all extremities and pronator drift was absent.  Bulk was normal and fasciculations were absent.   Motor Tone - Muscle tone was assessed at the neck and appendages and was normal.  Reflexes - The patient's reflexes were 1+ in all extremities and she had no pathological reflexes.  Sensory - Light touch, temperature/pinprick were assessed and were symmetrical.    Coordination - The patient had normal movements in the hands and feet with no ataxia or dysmetria.  Essential tremor was present b/l, more on the right than left.  Gait and Station - deferred   ASSESSMENT/PLAN Kathleen Dunn is a 76 y.o. female with history of hypertension, asthma, known lung nodule, and thyroid disease presenting with uncontrollable movements of the left upper extremity. She did not receive IV t-PA due to transient deficits.  Strokes:  scattered acute infarcts in both cerebellar hemispheres and the Lt PCA territory - likely embolic from [facility].  Resultant  No deficit  CT head - small acute/ subacute cerebellar infarctions.  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  CTA H&N - proximal BA high-grade stenosis, mid BA atherosclerosis with post stenotic dilatation, left VA atherosclerosis, right VA hypoplastic with distal occlusion, proximal right ICA 60-70% stenosis and proximal left ICA 40-50% stenosis.  Cerebral angio - proximal BA > 95% stenosis  EEG - normal  2D Echo - unremarkable  LDL - unable to calculate - triglycerides 783  HgbA1c 5.8  VTE prophylaxis - Lovenox Diet Carb Modified Fluid consistency: Thin; Room service appropriate?  Yes  No antithrombotic prior to admission, now on aspirin 325 mg daily and clopidogrel 75 mg daily.  Patient counseled to be compliant with her antithrombotic medications  Ongoing aggressive stroke risk factor management  Therapy recommendations: pending  Disposition:  Pending - pt is scheduled for BA angioplasty and/or stenting Wednesday 03/24/2017 with Dr. Estanislado Pandy.  Posterior circulation extensive athero  MRA showed BA severe stenosis, right VA occlusion and left PCA decreased flow  CTA head and neck proximal BA high-grade stenosis, mid BA atherosclerosis with post stenotic dilatation, left VA atherosclerosis, right VA hypoplastic with distal occlusion  DSA proximal BA > 95% stenosis  Continue DAPT  Carotid stenosis  CTA head and neck - proximal right ICA 60-70%  stenosis and proximal left ICA 40-50% stenosis.  DSA about 50% stenosis of right ICA  On DAPT  Hypertension  Stable  On home meds - low dose metoprolol and losartan  BP goal 130-160 due to intracranial stenosis  Hyperlipidemia  Home meds:  No lipid lowering medications prior to admission  LDL could not be calculated secondary to elevated triglycerides, goal < 70  Now on crestor 40 and zetia 10  Continue at discharge  Other Stroke Risk Factors  Advanced age  Former cigarette smoker - quit 45 years ago.  Obesity, Body mass index is 35.62 kg/m., recommend weight loss, diet and exercise as appropriate   Depo Estradiol PTA  Other Active Problems  None  Hospital day # 11  I have personally examined this patient, reviewed notes, independently viewed imaging studies, participated in medical decision making and plan of care.ROS completed by me personally and pertinent positives fully documented  I have made any additions or clarifications directly to the above note. Patient  is doing well. Recommend mobilize out of bed. Physical occupational therapy consults. Transfer to the floor bed and discharged home with the next 1-2 days. Continue dual antiplatelet therapy. Follow-up as an outpatient in the stroke clinic in 6 weeks. Discussed with Dr. Estanislado Pandy. Greater than 50% time during this 25 minute visit was spent on counseling and coordination of care about her stroke, basilar stenosis, angioplasty and stenting discussion and answering questions  Antony Contras, MD Medical Director [place] Pager: [phone] 03/27/2017 3:55 PM  Stroke Neurology 03/27/2017 3:55 PM   To contact Stroke Continuity provider, please refer to [email or web address] After hours, contact General Neurology

## 2017-03-27 NOTE — Progress Notes (Signed)
Referring Physician(s):  Dr. Antony Contras  Supervising Physician: [name]  Patient Status:  [facility] - In-pt  Chief Complaint:  S/p basilar artery angioplasty with residual patency of 65-70%  Subjective: Sleepy, but easily arousable this AM. Complaints of mild headache.  Allergies: Codeine; Hydrocodone-acetaminophen; Morphine; Lipitor [atorvastatin]; Morphine and related; Penicillins; Albuterol; Erythromycin; Hydroxyzine pamoate; Iodine; Simvastatin; and Sulfonamide derivatives  Medications: Prior to Admission medications   Medication Sig Start Date End Date Taking? Authorizing Provider  acetaminophen (TYLENOL) 500 MG tablet Take 250-500 mg by mouth every 6 (six) hours as needed for headache (pain).   Yes [provider]  acetic acid-hydrocortisone (VOSOL-HC) otic solution Place 3 drops into both ears 3 (three) times daily. Patient taking differently: Place 3 drops into both ears 3 (three) times daily as needed (itching).  06/21/16  Yes Hali Marry, MD  albuterol (PROVENTIL HFA;VENTOLIN HFA) 108 (90 Base) MCG/ACT inhaler Inhale 1 puff into the lungs every 6 (six) hours as needed for wheezing or shortness of breath.   Yes [provider]  Betamethasone Valerate 0.12 % foam Apply 1 application topically daily as needed (scalp itching).   Yes [provider]  Cholecalciferol (VITAMIN D3) 3000 units TABS Take 3,000 Units by mouth daily.   Yes [provider]  clonazePAM (KLONOPIN) 1 MG tablet Take 1 tablet (1 mg total) by mouth 2 (two) times daily as needed for anxiety. Patient taking differently: Take 0.5-1 mg by mouth See admin instructions. Take 1/2 - 1 tablet (0.5 mg-1 mg) by mouth daily at bedtime, may also take 1/2 tablet (0.5 mg) during the day or during the night as needed for anxiety 04/01/16  Yes Metheney, Rene Kocher, MD  DEPO-ESTRADIOL 5 MG/ML injection Inject 1 mL (5 mg total) into the muscle every 28 (twenty-eight)  days. Patient taking differently: Inject 2.5 mg into the muscle every 14 (fourteen) days. Last injection 03/05/17 06/21/16  Yes Hali Marry, MD  [facility] SEAL PO Take 1 capsule by mouth 2 (two) times daily as needed (congestion).   Yes [provider]  gabapentin (NEURONTIN) 100 MG capsule Take 100 mg by mouth at bedtime as needed (knee pain/ neuropathy).  12/21/16  Yes [provider]  ketoconazole (NIZORAL) 2 % shampoo APPLY TO AFFECTED AREA TWICE A WEEK Patient taking differently: Apply 1 application topically See admin instructions. Use to shampoo hair up to twice a week as needed for itching 11/03/15  Yes Hali Marry, MD  levothyroxine (SYNTHROID, LEVOTHROID) 50 MCG tablet Take 1 tablet (50 mcg total) by mouth daily. NEED FOLLOW UP VISIT FOR MORE REFILLS 08/06/16  Yes Hali Marry, MD  losartan (COZAAR) 25 MG tablet Take 1 tablet (25 mg total) by mouth daily. NEED FOLLOW UP VISIT FOR MORE REFILLS Patient taking differently: Take 25 mg by mouth at bedtime. NEED FOLLOW UP VISIT FOR MORE REFILLS 06/12/16  Yes Hali Marry, MD  lovastatin (MEVACOR) 40 MG tablet Take 40 mg by mouth at bedtime.   Yes [provider]  meloxicam (MOBIC) 7.5 MG tablet Take 3.75 mg by mouth daily as needed (joint pain).   Yes [provider]  metoprolol tartrate (LOPRESSOR) 25 MG tablet Take 1 tablet (25 mg total) by mouth daily. Patient taking differently: Take 25 mg by mouth at bedtime.  11/03/15  Yes Hali Marry, MD  mometasone (NASONEX) 50 MCG/ACT nasal spray Place 2 sprays into the nose daily as needed (congestion).   Yes [provider]  pantoprazole (PROTONIX) 40 MG tablet Take 40 mg by mouth at bedtime.  02/17/17  Yes [provider]  predniSONE (DELTASONE) 5 MG tablet Take 1.25-2.5 mg by mouth 3 (three) times daily as needed (congestion).    Yes [provider]  QVAR 40 MCG/ACT inhaler USE 2 INHALATIONS  TWICE A DAY Patient taking differently: USE 2 INHALATIONS TWICE A DAY AS NEEDED FOR SHORTNESS OF BREATH/WHEEZING 07/22/14  Yes Hali Marry, MD  ranitidine (ZANTAC) 150 MG capsule TAKE 1 CAPSULE TWICE A DAY AS NEEDED FOR HEARTBURN Patient taking differently: Take 150 mg by mouth daily as needed for heartburn.  11/03/15  Yes Hali Marry, MD  traMADol (ULTRAM) 50 MG tablet Take 1 tablet (50 mg total) by mouth every 12 (twelve) hours as needed (knee pain). 02/21/16  Yes Hali Marry, MD  benzonatate (TESSALON) 100 MG capsule Take 1-2 capsules (100-200 mg total) by mouth 3 (three) times daily as needed for cough. Patient not taking: Reported on 03/15/2017 04/01/16   Hali Marry, MD  Cholecalciferol (VITAMIN D3) 1000 units CAPS Take 1 capsule (1,000 Units total) by mouth daily. Patient not taking: Reported on 03/15/2017 11/03/15   Hali Marry, MD  triamterene-hydrochlorothiazide (DYAZIDE) 37.5-25 MG capsule TAKE 1 CAPSULE DAILY Patient not taking: Reported on 03/15/2017 03/14/16   Hali Marry, MD     Vital Signs: BP (!) 134/49   Pulse 73   Temp 97.8 F (36.6 C) (Axillary)   Resp 19   Ht 5\' 1"  (1.549 m)   Wt 188 lb 7.9 oz (85.5 kg)   SpO2 96%   BMI 35.62 kg/m   Physical Exam  NAD, alert, communicative Groin intact, soft.  No evidence of pseudoaneurysm or hematoma.  Neuro:  Face symmetrical, tongue midline, moves all 4 extremities, finger-to-nose stable from prior exam.  Imaging: No results found.  Labs:  CBC:  Recent Labs  03/21/17 1352 03/22/17 0335 03/26/17 0605 03/27/17 0634  WBC 6.9 8.0 8.6 8.4  HGB 13.0 12.6 12.7 12.2  HCT 38.6 37.8 38.2 37.2  PLT 242 222 243 231    COAGS:  Recent Labs  03/17/17 0630 03/20/17 0823 03/26/17 0605  INR 1.09 1.03 1.03  APTT  --   --  35    BMP:  Recent Labs  03/21/17 1352 03/22/17 0335 03/26/17 0605 03/27/17 0634  NA 138 137 138 137  K 4.3 4.7 3.7 3.8  CL 105 104 102 104   CO2 25 27 27 24   GLUCOSE 122* 108* 108* 109*  BUN 8 9 11 6   CALCIUM 8.5* 8.7* 8.9 8.7*  CREATININE 0.89 0.90 0.88 0.78  GFRNONAA >60 >60 >60 >60  GFRAA >60 >60 >60 >60    LIVER FUNCTION TESTS:  Recent Labs  03/20/17 0823 03/21/17 1352 03/22/17 0335 03/26/17 0605  BILITOT 0.6 1.0 0.9 1.0  AST 19 39 34 32  ALT 17 27 26  43  ALKPHOS 35* 43 39 42  PROT 5.4* 6.0* 5.9* 6.1*  ALBUMIN 3.1* 3.4* 3.2* 3.5    Assessment and Plan: 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  Arousable.  Groin soft. Tolerating liquids.  Will try some solid foods.  Continue Brilinta 90mg  BID Discharge plan per Neuro service.  Will plan to meet with patient in 2 weeks after discharge. IR to follow   Electronically Signed: Docia Barrier, PA 03/27/2017, 10:26 AM   I spent a total of 15 Minutes at the  the patient's bedside AND on the patient's hospital floor or unit, greater than 50% of which was counseling/coordinating care for basiliar artery stenosis.

## 2017-03-28 LAB — TRIGLYCERIDES: Triglycerides: 162 mg/dL — ABNORMAL HIGH

## 2017-03-28 MED ORDER — FLUTICASONE PROPIONATE 50 MCG/ACT NA SUSP
2.0000 | Freq: Every day | NASAL | Status: DC
  Administered 2017-03-29 – 2017-04-01 (×4): 2 via NASAL
  Filled 2017-03-28: qty 16

## 2017-03-28 MED ORDER — ASPIRIN EC 81 MG PO TBEC
81.0000 mg | DELAYED_RELEASE_TABLET | Freq: Every day | ORAL | Status: DC
  Administered 2017-03-29 – 2017-04-01 (×4): 81 mg via ORAL
  Filled 2017-03-28 (×4): qty 1

## 2017-03-28 MED ORDER — MAGNESIUM HYDROXIDE 400 MG/5ML PO SUSP
5.0000 mL | Freq: Once | ORAL | Status: AC
  Administered 2017-03-28: 5 mL via ORAL
  Filled 2017-03-28: qty 30

## 2017-03-28 NOTE — Progress Notes (Signed)
Occupational Therapy Treatment Patient Details Name: Kathleen Dunn MRN: [id number] DOB: 1940/10/06 Today's Date: 03/28/2017    History of present illness Pt is a 76 yo female admitted through ED on 03/15/17 due to an episode of L arm twisting, dizziness and fatigue. Pt was diagnosed with subacute cerebellar stroke. PMH significant for asthma, HTN, thyroid disease, anxiety.     OT comments  Pt presented supine in bed and willing to work with therapy. Pt states again and again that she does not feel safe to go home (Pt lives alone) and she feels weaker and nauseous (causing headache). Pt required vc with RW attempting to walk with only one hand on walker due to IV in wrist. Pt able to perform sink level grooming with LOB x2 with Pt self-correcting. Pt tearful several times during session about situation and not feeling safe at home, despite encouragement from therapist that she is doing well. Energy conservation reinforced to attempt to build up confidence. Pt shared several experiences where she has fallen at home, and remains anxious, and at this time OT changing recommendation to short term SNF placement for Pt safety, and for Pt to build up strength, improve balance, and return to PLOF before returning home.   Follow Up Recommendations  SNF    Equipment Recommendations  Other (comment) (defer to next venue)    Recommendations for Other Services      Precautions / Restrictions Precautions Precautions: Fall Restrictions Weight Bearing Restrictions: No       Mobility Bed Mobility Overal bed mobility: Modified Independent             General bed mobility comments: use of rail  Transfers Overall transfer level: Needs assistance Equipment used: Rolling walker (2 wheeled) Transfers: Sit to/from Stand Sit to Stand: Min assist         General transfer comment: min A for power up from bed and recliner    Balance Overall balance assessment: Needs assistance Sitting-balance  support: No upper extremity supported;Feet supported Sitting balance-Leahy Scale: Normal Sitting balance - Comments: sitting EOB to don socks   Standing balance support: Single extremity supported;Bilateral upper extremity supported;During functional activity;No upper extremity supported Standing balance-Leahy Scale: Fair Standing balance comment: Pt able to static stand within walker for sink level grooming with self corrected balance, Pt ambulating with RW                           ADL either performed or assessed with clinical judgement   ADL Overall ADL's : Needs assistance/impaired Eating/Feeding: Set up;Sitting   Grooming: Wash/dry hands;Wash/dry face;Oral care;Supervision/safety;Standing Grooming Details (indicate cue type and reason): standing at sink in RW, LOB x2 able to self correct             Lower Body Dressing: Set up;Sit to/from stand Lower Body Dressing Details (indicate cue type and reason): sitting EOB to don socks Toilet Transfer: Supervision/safety;Ambulation;Regular Toilet;RW   Toileting- Clothing Manipulation and Hygiene: Supervision/safety;Sit to/from stand Toileting - Clothing Manipulation Details (indicate cue type and reason): hospital gown and peri care     Functional mobility during ADLs: Min guard;Rolling walker;Cueing for safety General ADL Comments: Pt is very concerned about safety during ADL as she lives at home alone     Vision       Perception     Praxis      Cognition Arousal/Alertness: Awake/alert Behavior During Therapy: Anxious Overall Cognitive Status: Within Functional Limits for tasks assessed  Exercises     Shoulder Instructions       General Comments Pt stated again and again that she does not feel safe going home. Pt shared that she would really like to go and stay with her daughter in [place]. She is very worried about falling and has fallen in the past.  She states that she also feels very very weak and tires easily. OT encouraged movement throughout the day and OOB activities in smaller amounts.     Pertinent Vitals/ Pain       Pain Assessment: 0-10 Pain Score: 3  Pain Location: head  Pain Descriptors / Indicators: Headache Pain Intervention(s): Monitored during session;Repositioned;RN gave pain meds during session  Home Living                                          Prior Functioning/Environment              Frequency  Min 2X/week        Progress Toward Goals  OT Goals(current goals can now be found in the care plan section)  Progress towards OT goals: Not progressing toward goals - comment (Pt with increased fatigue, inrceased anxiety)  Acute Rehab OT Goals Patient Stated Goal: to get stronger again OT Goal Formulation: With patient Time For Goal Achievement: 04/01/17 Potential to Achieve Goals: Good  Plan Discharge plan needs to be updated;Frequency remains appropriate    Co-evaluation                 AM-PAC PT "6 Clicks" Daily Activity     Outcome Measure   Help from another person eating meals?: None Help from another person taking care of personal grooming?: A Little Help from another person toileting, which includes using toliet, bedpan, or urinal?: A Little Help from another person bathing (including washing, rinsing, drying)?: A Little Help from another person to put on and taking off regular upper body clothing?: A Little Help from another person to put on and taking off regular lower body clothing?: A Little 6 Click Score: 19    End of Session Equipment Utilized During Treatment: Gait belt  OT Visit Diagnosis: Unsteadiness on feet (R26.81);Muscle weakness (generalized) (M62.81)   Activity Tolerance Patient tolerated treatment well   Patient Left in bed;with call bell/phone within reach;Other (comment) (with PA from radiology in room examining patient)   Nurse  Communication Mobility status;Other (comment) (Pt needs SNF placement)        Time: 8828-0034 OT Time Calculation (min): 77 min  Charges: OT General Charges $OT Visit: 1 Procedure OT Treatments $Self Care/Home Management : 38-52 mins $Therapeutic Activity: 23-37 mins  Hulda Humphrey OTR/L [place] 03/28/2017, 10:08 AM

## 2017-03-28 NOTE — Progress Notes (Signed)
Physical Therapy Treatment Patient Details Name: Kathleen Dunn MRN: [id number] DOB: [birth year] Today's Date: 03/28/2017    History of Present Illness Pt is a 76 yo female admitted through ED on 03/15/17 due to an episode of L arm twisting, dizziness and fatigue. Pt was diagnosed with subacute cerebellar stroke. PMH significant for asthma, HTN, thyroid disease, anxiety.      PT Comments    Pt seen for mobility progression; however, pt very limited this session secondary to reports of "weakness", nausea and fatigue. Pt only ambulating within her room with one HHA (refusing to use any AD) and min A for stability. PT changing recommendations for pt to d/c to SNF for ST rehab services prior to returning home as pt lives alone and is very weak and anxious. Pt would continue to benefit from skilled physical therapy services at this time while admitted and after d/c to address the below listed limitations in order to improve overall safety and independence with functional mobility.    Follow Up Recommendations  SNF     Equipment Recommendations  Cane    Recommendations for Other Services       Precautions / Restrictions Precautions Precautions: Fall Restrictions Weight Bearing Restrictions: No    Mobility  Bed Mobility Overal bed mobility: Modified Independent             General bed mobility comments: use of rail  Transfers Overall transfer level: Needs assistance Equipment used: Rolling walker (2 wheeled) Transfers: Sit to/from Stand Sit to Stand: Min guard         General transfer comment: increased time, min guard for safety  Ambulation/Gait Ambulation/Gait assistance: Min assist Ambulation Distance (Feet): 40 Feet (40' x1, 10' x1 and 10' x1 after sitting on toilet) Assistive device: 1 person hand held assist Gait Pattern/deviations: Step-through pattern;Decreased step length - right;Decreased step length - left;Decreased stride length;Shuffle Gait velocity:  decreased Gait velocity interpretation: Below normal speed for age/gender General Gait Details: pt ambulated within room, limited distance this session secondary to pt reports of nausea and fatigue   Stairs            Wheelchair Mobility    Modified Rankin (Stroke Patients Only) Modified Rankin (Stroke Patients Only) Pre-Morbid Rankin Score: No symptoms Modified Rankin: Moderately severe disability     Balance Overall balance assessment: Needs assistance Sitting-balance support: No upper extremity supported;Feet supported Sitting balance-Leahy Scale: Good Sitting balance - Comments: pt sitting EOB with supervision   Standing balance support: During functional activity;No upper extremity supported Standing balance-Leahy Scale: Fair Standing balance comment: pt able to statically stand without UE support, requires at least one UE support for dynamic tasks                            Cognition Arousal/Alertness: Awake/alert Behavior During Therapy: Anxious Overall Cognitive Status: Within Functional Limits for tasks assessed                                        Exercises      General Comments General comments (skin integrity, edema, etc.): Pt stated again and again that she does not feel safe going home. Pt shared that she would really like to go and stay with her daughter in [place]. She is very worried about falling and has fallen in the past. She states that she also feels  very very weak and tires easily. OT encouraged movement throughout the day and OOB activities in smaller amounts.       Pertinent Vitals/Pain Pain Assessment: No/denies pain Pain Score: 3  Pain Location: head  Pain Descriptors / Indicators: Headache Pain Intervention(s): Monitored during session;Repositioned;RN gave pain meds during session    Home Living                      Prior Function            PT Goals (current goals can now be found in the care  plan section) Acute Rehab PT Goals Patient Stated Goal: to get stronger again PT Goal Formulation: With patient Time For Goal Achievement: 03/30/17 Potential to Achieve Goals: Good Progress towards PT goals: Not progressing toward goals - comment (pt with reports of nausea and fatigue)    Frequency    Min 3X/week      PT Plan Discharge plan needs to be updated    Co-evaluation              AM-PAC PT "6 Clicks" Daily Activity  Outcome Measure  Difficulty turning over in bed (including adjusting bedclothes, sheets and blankets)?: None Difficulty moving from lying on back to sitting on the side of the bed? : None Difficulty sitting down on and standing up from a chair with arms (e.g., wheelchair, bedside commode, etc,.)?: A Little Help needed moving to and from a bed to chair (including a wheelchair)?: A Little Help needed walking in hospital room?: A Little Help needed climbing 3-5 steps with a railing? : A Little 6 Click Score: 20    End of Session Equipment Utilized During Treatment: Gait belt Activity Tolerance: Patient tolerated treatment well Patient left: in bed;with call bell/phone within reach;with bed alarm set;with SCD's reapplied Nurse Communication: Mobility status PT Visit Diagnosis: Unsteadiness on feet (R26.81)     Time: 0109-3235 PT Time Calculation (min) (ACUTE ONLY): 23 min  Charges:  $Gait Training: 8-22 mins $Therapeutic Activity: 8-22 mins                    G Codes:       [place], [place], DPT [place] 03/28/2017, 10:28 AM

## 2017-03-28 NOTE — Progress Notes (Signed)
PROGRESS NOTE    Kathleen Dunn  NTZ:[id number] DOB: [birth year] DOA: 03/15/2017 PCP: Hali Marry, MD   Brief Narrative: Kathleen Dunn is a 76 y.o. chemotherapy history of hypertension, hypokalemia, generalized anxiety disorder. Patient presents intermittent shaking and was found to have a acute CVA.   Assessment & Plan:   Principal Problem:   Cerebellar stroke (HCC) Active Problems:   Hypothyroidism   HYPERCHOLESTEROLEMIA   HYPERTENSION, BENIGN SYSTEMIC   GAD (generalized anxiety disorder)   Mixed hyperlipidemia   Basilar artery stenosis   Bilateral carotid artery stenosis   Cerebrovascular accident (CVA) due to thrombosis of precerebral artery (HCC)   Basilar artery stenosis/occlusion with infarction ([place])   Acute cerebellar stroke Secondary to basilar artery stenosis. CVA seen on MRI. Angiogram significant for proximal basilar artery stenosis and 95%. S/p basilar artery angioplasty; residual patency of 65-70% -Interventional radiology recommendations: outpatient follow-up -Neurology recommendations: outpatient follow-up -Continue aspirin and Plavix -PT/OT recommendations: SNF  Essential hypertension -Continue metoprolol  Hypothyroidism -Continue Synthroid  Anxiety -Continue Klonopin when necessary  Right knee pain Secondary to arthritis. -Continue Voltaren gel  Headache Chronic. -continue Tylenol prn -continue home Tramdol  Post-nasal drip -flonase   DVT prophylaxis: Lovenox Code Status: Full code Family Communication: None at bedside Disposition Plan: Discharge when bed available   Consultants:   Interventional radiology  Neurology/stroke team  Procedures:   Echocardiogram: Grade 1 diastolic dysfunction, EF of 60-65%, moderate LVH  Antimicrobials:  None    Subjective: Intermittent headache. Chronic.  Objective: Vitals:   03/27/17 2150 03/28/17 0025 03/28/17 0505 03/28/17 0918  BP: (!) 158/75 140/75 (!) 148/79 (!)  154/92  Pulse: 80 75 77 77  Resp: 20 18 18 20   Temp: 98.2 F (36.8 C) 98.9 F (37.2 C) 98.5 F (36.9 C) 98 F (36.7 C)  TempSrc: Oral Oral Oral Oral  SpO2: 96% 96% 97% 97%  Weight:      Height:        Intake/Output Summary (Last 24 hours) at 03/28/17 1152 Last data filed at 03/28/17 0406  Gross per 24 hour  Intake           1482.5 ml  Output                0 ml  Net           1482.5 ml   Filed Weights   03/15/17 0726 03/26/17 1300 03/26/17 1730  Weight: 86.2 kg (190 lb) 86.2 kg (190 lb 0.6 oz) 85.5 kg (188 lb 7.9 oz)    Examination:  General exam: Comfortable. No distress Respiratory system: Clear to auscultation bilaterally. Unlabored work of breathing. No wheezing or rales. Cardiovascular: Regular rate and rhythm. Normal S1 and S2. No heart murmurs present. No extra heart sounds Gastrointestinal system: Nondistended Neuro: 4/5 strength in all 4 extremities. No focal neurological deficits Skin: No cyanosis. No rashes    Data Reviewed: I have personally reviewed following labs and imaging studies  CBC:  Recent Labs Lab 03/21/17 1352 03/22/17 0335 03/26/17 0605 03/27/17 0634  WBC 6.9 8.0 8.6 8.4  NEUTROABS  --   --  5.3 5.7  HGB 13.0 12.6 12.7 12.2  HCT 38.6 37.8 38.2 37.2  MCV 94.1 93.1 93.9 95.6  PLT 242 222 243 675   Basic Metabolic Panel:  Recent Labs Lab 03/21/17 1352 03/22/17 0335 03/26/17 0605 03/27/17 0634  NA 138 137 138 137  K 4.3 4.7 3.7 3.8  CL 105 104 102 104  CO2 25 27 27  24  GLUCOSE 122* 108* 108* 109*  BUN 8 9 11 6   CREATININE 0.89 0.90 0.88 0.78  CALCIUM 8.5* 8.7* 8.9 8.7*   GFR: Estimated Creatinine Clearance: 61.3 mL/min (by C-G formula based on SCr of 0.78 mg/dL). Liver Function Tests:  Recent Labs Lab 03/21/17 1352 03/22/17 0335 03/26/17 0605  AST 39 34 32  ALT 27 26 43  ALKPHOS 43 39 42  BILITOT 1.0 0.9 1.0  PROT 6.0* 5.9* 6.1*  ALBUMIN 3.4* 3.2* 3.5   No results for input(s): LIPASE, AMYLASE in the last 168  hours. No results for input(s): AMMONIA in the last 168 hours. Coagulation Profile:  Recent Labs Lab 03/26/17 0605  INR 1.03   Cardiac Enzymes: No results for input(s): CKTOTAL, CKMB, CKMBINDEX, TROPONINI in the last 168 hours. BNP (last 3 results) No results for input(s): PROBNP in the last 8760 hours. HbA1C: No results for input(s): HGBA1C in the last 72 hours. CBG:  Recent Labs Lab 03/26/17 0824  GLUCAP 89       Radiology Studies: No results found.      Scheduled Meds: .  stroke: mapping our early stages of recovery book   Does not apply Once  . [START ON 03/29/2017] aspirin EC  81 mg Oral Daily  . budesonide (PULMICORT) nebulizer solution  0.25 mg Nebulization BID  . clonazePAM  1 mg Oral QHS  . enoxaparin (LOVENOX) injection  40 mg Subcutaneous Q24H  . ezetimibe  10 mg Oral Daily  . famotidine  20 mg Oral BID  . levothyroxine  50 mcg Oral QAC breakfast  . losartan  50 mg Oral Daily  . rosuvastatin  40 mg Oral q1800  . ticagrelor  90 mg Oral BID   Continuous Infusions: . sodium chloride 50 mL/hr at 03/28/17 0406  . sodium chloride 75 mL/hr at 03/27/17 1900     LOS: 12 days     Cordelia Poche, MD [facility] 03/28/2017, 11:52 AM Pager: [phone]  If 7PM-7AM, please contact night-coverage [email or web address] Password [facility] 03/28/2017, 11:52 AM

## 2017-03-28 NOTE — Care Management Note (Signed)
Case Management Note  Patient Details  Name: Kathleen Dunn MRN: [id number] Date of Birth: 08-Feb-1941  Subjective/Objective:                    Action/Plan: PT/OT recommendations changed to SNF. CSW aware. CM also following.  Expected Discharge Date:                  Expected Discharge Plan:  Home/Self Care  In-House Referral:     Discharge planning Services  CM Consult  Post Acute Care Choice:    Choice offered to:     DME Arranged:    DME Agency:     HH Arranged:    HH Agency:     Status of Service:  In process, will continue to follow  If discussed at Long Length of Stay Meetings, dates discussed:    Additional Comments:  Pollie Friar, RN 03/28/2017, 11:10 AM

## 2017-03-28 NOTE — Progress Notes (Signed)
Pt. Not compliant w/ using the walker, states "I don't feel safe with it." Pt. Wobbling and bending over without the walker. Pt. places walker at her side and then hangs on to the bed with her other hand. Pt. Complains when staff attempt to hold on to her to steady her saying that we are holding too tight. Pt. Not compliant with any safety measures for ambulation.

## 2017-03-28 NOTE — Progress Notes (Signed)
Referring Physician(s): Dr Royal Hawthorn  Supervising Physician: [name]  Patient Status:  [facility] - In-pt  Chief Complaint:  Basilar artery angioplasty in IR 7/11  Subjective:  Up in chair PT at bedside Pt transferred from chair to bed with minimal help Doing well Feels "tired and worn out"   Allergies: Codeine; Hydrocodone-acetaminophen; Morphine; Lipitor [atorvastatin]; Morphine and related; Penicillins; Albuterol; Erythromycin; Hydroxyzine pamoate; Iodine; Simvastatin; and Sulfonamide derivatives  Medications: Prior to Admission medications   Medication Sig Start Date End Date Taking? Authorizing Provider  acetaminophen (TYLENOL) 500 MG tablet Take 250-500 mg by mouth every 6 (six) hours as needed for headache (pain).   Yes [provider]  acetic acid-hydrocortisone (VOSOL-HC) otic solution Place 3 drops into both ears 3 (three) times daily. Patient taking differently: Place 3 drops into both ears 3 (three) times daily as needed (itching).  06/21/16  Yes Hali Marry, MD  albuterol (PROVENTIL HFA;VENTOLIN HFA) 108 (90 Base) MCG/ACT inhaler Inhale 1 puff into the lungs every 6 (six) hours as needed for wheezing or shortness of breath.   Yes [provider]  Betamethasone Valerate 0.12 % foam Apply 1 application topically daily as needed (scalp itching).   Yes [provider]  Cholecalciferol (VITAMIN D3) 3000 units TABS Take 3,000 Units by mouth daily.   Yes [provider]  clonazePAM (KLONOPIN) 1 MG tablet Take 1 tablet (1 mg total) by mouth 2 (two) times daily as needed for anxiety. Patient taking differently: Take 0.5-1 mg by mouth See admin instructions. Take 1/2 - 1 tablet (0.5 mg-1 mg) by mouth daily at bedtime, may also take 1/2 tablet (0.5 mg) during the day or during the night as needed for anxiety 04/01/16  Yes Metheney, Rene Kocher, MD  DEPO-ESTRADIOL 5 MG/ML injection Inject 1 mL (5 mg total) into the muscle every 28  (twenty-eight) days. Patient taking differently: Inject 2.5 mg into the muscle every 14 (fourteen) days. Last injection 03/05/17 06/21/16  Yes Hali Marry, MD  [facility] SEAL PO Take 1 capsule by mouth 2 (two) times daily as needed (congestion).   Yes [provider]  gabapentin (NEURONTIN) 100 MG capsule Take 100 mg by mouth at bedtime as needed (knee pain/ neuropathy).  12/21/16  Yes [provider]  ketoconazole (NIZORAL) 2 % shampoo APPLY TO AFFECTED AREA TWICE A WEEK Patient taking differently: Apply 1 application topically See admin instructions. Use to shampoo hair up to twice a week as needed for itching 11/03/15  Yes Hali Marry, MD  levothyroxine (SYNTHROID, LEVOTHROID) 50 MCG tablet Take 1 tablet (50 mcg total) by mouth daily. NEED FOLLOW UP VISIT FOR MORE REFILLS 08/06/16  Yes Hali Marry, MD  losartan (COZAAR) 25 MG tablet Take 1 tablet (25 mg total) by mouth daily. NEED FOLLOW UP VISIT FOR MORE REFILLS Patient taking differently: Take 25 mg by mouth at bedtime. NEED FOLLOW UP VISIT FOR MORE REFILLS 06/12/16  Yes Hali Marry, MD  lovastatin (MEVACOR) 40 MG tablet Take 40 mg by mouth at bedtime.   Yes [provider]  meloxicam (MOBIC) 7.5 MG tablet Take 3.75 mg by mouth daily as needed (joint pain).   Yes [provider]  metoprolol tartrate (LOPRESSOR) 25 MG tablet Take 1 tablet (25 mg total) by mouth daily. Patient taking differently: Take 25 mg by mouth at bedtime.  11/03/15  Yes Hali Marry, MD  mometasone (NASONEX) 50 MCG/ACT nasal spray Place 2 sprays into the nose daily  as needed (congestion).   Yes [provider]  pantoprazole (PROTONIX) 40 MG tablet Take 40 mg by mouth at bedtime.  02/17/17  Yes [provider]  predniSONE (DELTASONE) 5 MG tablet Take 1.25-2.5 mg by mouth 3 (three) times daily as needed (congestion).    Yes [provider]  QVAR 40 MCG/ACT inhaler USE  2 INHALATIONS TWICE A DAY Patient taking differently: USE 2 INHALATIONS TWICE A DAY AS NEEDED FOR SHORTNESS OF BREATH/WHEEZING 07/22/14  Yes Hali Marry, MD  ranitidine (ZANTAC) 150 MG capsule TAKE 1 CAPSULE TWICE A DAY AS NEEDED FOR HEARTBURN Patient taking differently: Take 150 mg by mouth daily as needed for heartburn.  11/03/15  Yes Hali Marry, MD  traMADol (ULTRAM) 50 MG tablet Take 1 tablet (50 mg total) by mouth every 12 (twelve) hours as needed (knee pain). 02/21/16  Yes Hali Marry, MD  benzonatate (TESSALON) 100 MG capsule Take 1-2 capsules (100-200 mg total) by mouth 3 (three) times daily as needed for cough. Patient not taking: Reported on 03/15/2017 04/01/16   Hali Marry, MD  Cholecalciferol (VITAMIN D3) 1000 units CAPS Take 1 capsule (1,000 Units total) by mouth daily. Patient not taking: Reported on 03/15/2017 11/03/15   Hali Marry, MD  triamterene-hydrochlorothiazide (DYAZIDE) 37.5-25 MG capsule TAKE 1 CAPSULE DAILY Patient not taking: Reported on 03/15/2017 03/14/16   Hali Marry, MD     Vital Signs: BP (!) 154/92 (BP Location: Right Arm)   Pulse 77   Temp 98 F (36.7 C) (Oral)   Resp 20   Ht 5\' 1"  (1.549 m)   Wt 188 lb 7.9 oz (85.5 kg)   SpO2 97%   BMI 35.62 kg/m   Physical Exam  Constitutional: She is oriented to person, place, and time.  Abdominal: Soft. Bowel sounds are normal.  Musculoskeletal: Normal range of motion.  Neurological: She is alert and oriented to person, place, and time.  Skin: Skin is warm and dry.  Right groin is clean and dry NT No bleeding \\no  hematoma Rt foot 2+ pulses  Psychiatric: She has a normal mood and affect. Her behavior is normal. Judgment and thought content normal.  Nursing note and vitals reviewed.   Imaging: No results found.  Labs:  CBC:  Recent Labs  03/21/17 1352 03/22/17 0335 03/26/17 0605 03/27/17 0634  WBC 6.9 8.0 8.6 8.4  HGB 13.0 12.6 12.7 12.2    HCT 38.6 37.8 38.2 37.2  PLT 242 222 243 231    COAGS:  Recent Labs  03/17/17 0630 03/20/17 0823 03/26/17 0605  INR 1.09 1.03 1.03  APTT  --   --  35    BMP:  Recent Labs  03/21/17 1352 03/22/17 0335 03/26/17 0605 03/27/17 0634  NA 138 137 138 137  K 4.3 4.7 3.7 3.8  CL 105 104 102 104  CO2 25 27 27 24   GLUCOSE 122* 108* 108* 109*  BUN 8 9 11 6   CALCIUM 8.5* 8.7* 8.9 8.7*  CREATININE 0.89 0.90 0.88 0.78  GFRNONAA >60 >60 >60 >60  GFRAA >60 >60 >60 >60    LIVER FUNCTION TESTS:  Recent Labs  03/20/17 0823 03/21/17 1352 03/22/17 0335 03/26/17 0605  BILITOT 0.6 1.0 0.9 1.0  AST 19 39 34 32  ALT 17 27 26  43  ALKPHOS 35* 43 39 42  PROT 5.4* 6.0* 5.9* 6.1*  ALBUMIN 3.1* 3.4* 3.2* 3.5    Assessment and Plan:  Basilar artery angioplasty We will see  pt in follow up with Dr Estanislado Pandy in 2 weeks She will hear from scheduler for time and date Discharge planning per [facility]  Electronically Signed: Nylene Inlow A, PA-C 03/28/2017, 10:04 AM   I spent a total of 15 Minutes at the the patient's bedside AND on the patient's hospital floor or unit, greater than 50% of which was counseling/coordinating care for basilar artery angioplasty

## 2017-03-29 LAB — GLUCOSE, CAPILLARY: Glucose-Capillary: 119 mg/dL — ABNORMAL HIGH (ref 65–99)

## 2017-03-29 MED ORDER — LOSARTAN POTASSIUM 50 MG PO TABS: 50.0000 mg | ORAL_TABLET | Freq: Every day | ORAL | Status: DC

## 2017-03-29 MED ORDER — CLONAZEPAM 1 MG PO TABS: 1.0000 mg | ORAL_TABLET | Freq: Three times a day (TID) | ORAL | 0 refills | Status: DC | PRN

## 2017-03-29 MED ORDER — EZETIMIBE 10 MG PO TABS: 10.0000 mg | ORAL_TABLET | Freq: Every day | ORAL | Status: DC

## 2017-03-29 MED ORDER — TICAGRELOR 90 MG PO TABS: 90.0000 mg | ORAL_TABLET | Freq: Two times a day (BID) | ORAL | 0 refills | Status: DC

## 2017-03-29 MED ORDER — MAGNESIUM HYDROXIDE 400 MG/5ML PO SUSP
5.0000 mL | Freq: Once | ORAL | Status: AC
  Administered 2017-03-29: 5 mL via ORAL
  Filled 2017-03-29: qty 30

## 2017-03-29 MED ORDER — ROSUVASTATIN CALCIUM 40 MG PO TABS: 40.0000 mg | ORAL_TABLET | Freq: Every day | ORAL | Status: DC

## 2017-03-29 NOTE — Progress Notes (Signed)
PROGRESS NOTE    Kathleen Dunn  TUU:[id number] DOB: 31-Jul-1941 DOA: 03/15/2017 PCP: Hali Marry, MD   Brief Narrative: Kathleen Dunn is a 76 y.o. chemotherapy history of hypertension, hypokalemia, generalized anxiety disorder. Patient presents intermittent shaking and was found to have a acute CVA.   Assessment & Plan:   Principal Problem:   Cerebellar stroke (HCC) Active Problems:   Hypothyroidism   HYPERCHOLESTEROLEMIA   HYPERTENSION, BENIGN SYSTEMIC   GAD (generalized anxiety disorder)   Mixed hyperlipidemia   Basilar artery stenosis   Bilateral carotid artery stenosis   Cerebrovascular accident (CVA) due to thrombosis of precerebral artery (HCC)   Basilar artery stenosis/occlusion with infarction ([place])   Acute cerebellar stroke Secondary to basilar artery stenosis. CVA seen on MRI. Angiogram significant for proximal basilar artery stenosis and 95%. S/p basilar artery angioplasty; residual patency of 65-70% -Interventional radiology recommendations: outpatient follow-up -Neurology recommendations: outpatient follow-up -Continue aspirin and Plavix -PT/OT recommendations: SNF  Essential hypertension -Continue metoprolol  Hypothyroidism -Continue Synthroid  Anxiety -Continue Klonopin when necessary  Right knee pain Secondary to arthritis. -Continue Voltaren gel  Headache Chronic. -continue Tylenol prn -continue home Tramdol  Post-nasal drip -continue flonase   DVT prophylaxis: Lovenox Code Status: Full code Family Communication: None at bedside Disposition Plan: Discharge when bed available   Consultants:   Interventional radiology  Neurology/stroke team  Procedures:   Echocardiogram: Grade 1 diastolic dysfunction, EF of 60-65%, moderate LVH  Antimicrobials:  None    Subjective: Intermittent headache. Chronic.  Objective: Vitals:   03/28/17 2133 03/28/17 2200 03/29/17 0539 03/29/17 0959  BP: (!) 143/46 (!) 142/66 (!)  128/57 (!) 142/51  Pulse: 69  64 74  Resp: 18  18 18   Temp: 98.1 F (36.7 C)  98.1 F (36.7 C) 98.4 F (36.9 C)  TempSrc: Oral  Oral Oral  SpO2: 98%  94% 94%  Weight:      Height:        Intake/Output Summary (Last 24 hours) at 03/29/17 1347 Last data filed at 03/29/17 0900  Gross per 24 hour  Intake              120 ml  Output                0 ml  Net              120 ml   Filed Weights   03/15/17 0726 03/26/17 1300 03/26/17 1730  Weight: 86.2 kg (190 lb) 86.2 kg (190 lb 0.6 oz) 85.5 kg (188 lb 7.9 oz)    Examination:  General exam: Comfortable. No distress    Data Reviewed: I have personally reviewed following labs and imaging studies  CBC:  Recent Labs Lab 03/26/17 0605 03/27/17 0634  WBC 8.6 8.4  NEUTROABS 5.3 5.7  HGB 12.7 12.2  HCT 38.2 37.2  MCV 93.9 95.6  PLT 243 791   Basic Metabolic Panel:  Recent Labs Lab 03/26/17 0605 03/27/17 0634  NA 138 137  K 3.7 3.8  CL 102 104  CO2 27 24  GLUCOSE 108* 109*  BUN 11 6  CREATININE 0.88 0.78  CALCIUM 8.9 8.7*   GFR: Estimated Creatinine Clearance: 61.3 mL/min (by C-G formula based on SCr of 0.78 mg/dL). Liver Function Tests:  Recent Labs Lab 03/26/17 0605  AST 32  ALT 43  ALKPHOS 42  BILITOT 1.0  PROT 6.1*  ALBUMIN 3.5   No results for input(s): LIPASE, AMYLASE in the last 168 hours. No results for  input(s): AMMONIA in the last 168 hours. Coagulation Profile:  Recent Labs Lab 03/26/17 0605  INR 1.03   Cardiac Enzymes: No results for input(s): CKTOTAL, CKMB, CKMBINDEX, TROPONINI in the last 168 hours. BNP (last 3 results) No results for input(s): PROBNP in the last 8760 hours. HbA1C: No results for input(s): HGBA1C in the last 72 hours. CBG:  Recent Labs Lab 03/26/17 0824 03/29/17 1127  GLUCAP 89 119*       Radiology Studies: No results found.      Scheduled Meds: .  stroke: mapping our early stages of recovery book   Does not apply Once  . aspirin EC  81 mg  Oral Daily  . budesonide (PULMICORT) nebulizer solution  0.25 mg Nebulization BID  . clonazePAM  1 mg Oral QHS  . enoxaparin (LOVENOX) injection  40 mg Subcutaneous Q24H  . ezetimibe  10 mg Oral Daily  . famotidine  20 mg Oral BID  . fluticasone  2 spray Each Nare Daily  . levothyroxine  50 mcg Oral QAC breakfast  . losartan  50 mg Oral Daily  . rosuvastatin  40 mg Oral q1800  . ticagrelor  90 mg Oral BID   Continuous Infusions:    LOS: 13 days     Cordelia Poche, MD [facility] 03/29/2017, 1:47 PM Pager: [phone]  If 7PM-7AM, please contact night-coverage [email or web address] Password [email or web address] 03/29/2017, 1:47 PM

## 2017-03-29 NOTE — Clinical Social Work Note (Signed)
Clinical Social Work Assessment  Patient Details  Name: Kathleen Dunn MRN: [id number] Date of Birth: [birth year]  Date of referral:  03/29/17               Reason for consult:  Facility Placement, Discharge Planning                Permission sought to share information with:  Facility Art therapist granted to share information::  Yes, Verbal Permission Granted  Name::        Agency::  SNF  Relationship::     Contact Information:     Housing/Transportation Living arrangements for the past 2 months:  Single Family Home Source of Information:  Patient Patient Interpreter Needed:  None Criminal Activity/Legal Involvement Pertinent to Current Situation/Hospitalization:  No - Comment as needed Significant Relationships:  Adult Children Lives with:  Self Do you feel safe going back to the place where you live?  Yes Need for family participation in patient care:  No (Coment)  Care giving concerns:  Patient currently lives at home alone, and needs short term rehab to improve strength prior to returning home in order to successfully complete ADLs.   Social Worker assessment / plan:  CSW introduced self to patient and discussed role. CSW explained recommendation for SNF placement, and discussed with patient. Patient indicated that her ideal situation would be to go live with one of her children for a short while and receive home health therapy at one of their homes, but her children aren't wanting to take her in at this time. 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 CSW explained that she can help with locating facility placements and ensuring she gets the care she needs when she leaves the hospital. CSW completed referral and faxed to facilities. Patient was  interested in obtaining information on facilities that had pools to do water therapy, and CSW will provide information. CSW will follow to determine placement and facilitate discharge to SNF.  Employment status:  Retired Forensic scientist:  Medicare PT Recommendations:  [place] / Referral to community resources:  [place]  Patient/Family's Response to care:  Patient agreeable to SNF placement.  Patient/Family's Understanding of and Emotional Response to Diagnosis, Current Treatment, and Prognosis:  Patient expressed sadness and disappointment that her children have been unwilling to take her in and help her in her recovery. Patient also expressed grief at how she isn't bouncing back as quickly as she used to when she was younger. Patient indicated understanding of CSW role in discharge planning.  Emotional Assessment Appearance:  Appears stated age Attitude/Demeanor/Rapport:    Affect (typically observed):  Appropriate Orientation:  Oriented to Situation, Oriented to  Time, Oriented to Place, Oriented to Self Alcohol / Substance use:  Not Applicable Psych involvement (Current and /or in the community):  No (Comment)  Discharge Needs  Concerns to be addressed:  Care Coordination, Discharge Planning Concerns Readmission within the last 30 days:  No Current discharge risk:  Lives alone, Physical Impairment Barriers to Discharge:  Continued Medical Work up   [employer], [place] 03/29/2017, 11:23 AM

## 2017-03-29 NOTE — Progress Notes (Signed)
CSW met with patient's daughter and granddaughter at bedside, per request from RN to discuss placement options with the family. Patient's family requested information on if placement was available in [place], closer to where the daughter and family is located. Patient indicated that she did not want to be placed in [place] because she didn't know where anything was over there. CSW explained that it would be a rehab facility placement, and she wouldn't have to go anywhere, but patient became agitated and started arguing with everyone about how it was her choice and she didn't want anyone to be telling her what to do.   CSW provided list of bed offers to patient and attempted to get her to discuss preferences. Patient became upset and tearful, cried about how she was overwhelmed and needed time to pray. Patient's daughter indicated that it would likely be the best option to select a facility that was closest to where the patient was staying currently ([facility] [place]). Patient indicated that she needed time to think about what option she would prefer, because she may still decide to go home if the rehab decision becomes too much for her.  CSW indicated that she would follow up with the patient tomorrow morning on making a decision. Patient's PASRR is still pending, under manual review; will need PASRR number before coordinating SNF placement.  Laveda Abbe, [place] Clinical Social Worker [phone]

## 2017-03-29 NOTE — NC FL2 (Signed)
[place] LEVEL OF CARE SCREENING TOOL     IDENTIFICATION  Patient Name: Kathleen Dunn Birthdate: October 08, 1940 Sex: female Admission Date (Current Location): 03/15/2017  [facility] and [place] Number:   [facility])   Facility and Address:  [facility] [place] [address], [place], [place] [address]      Provider Number: [id number]  Attending Physician Name and Address:  Mariel Aloe, MD  Relative Name and Phone Number:       Current Level of Care: Hospital Recommended Level of Care: [place] Prior Approval Number:    Date Approved/Denied:   PASRR Number: Pending  Discharge Plan: SNF    Current Diagnoses: Patient Active Problem List   Diagnosis Date Noted  . Basilar artery stenosis/occlusion with infarction ([place]) 03/26/2017  . Cerebrovascular accident (CVA) due to thrombosis of precerebral artery ([place])   . Mixed hyperlipidemia   . Basilar artery stenosis   . Bilateral carotid artery stenosis   . Cerebellar stroke ([place]) 03/15/2017  . GAD (generalized anxiety disorder) 11/06/2015  . Coronary artery calcification 02/17/2015  . IFG (impaired fasting glucose) 01/12/2015  . Lung nodule < 6cm on CT 05/20/2014  . Obesity (BMI 30.0-34.9) 08/26/2013  . Chronic fatigue 02/18/2013  . DEPRESSION 01/08/2008  . FATIGUE 01/08/2008  . Hypothyroidism 12/04/2007  . HYPONATREMIA 12/04/2007  . ABNORMAL MAMMOGRAM 08/25/2007  . HYPERCHOLESTEROLEMIA 06/24/2006  . PERNICIOUS ANEMIA 06/24/2006  . HYPERTENSION, BENIGN SYSTEMIC 06/24/2006  . LOW BACK PAIN 06/24/2006  . TREMOR 06/24/2006    Orientation RESPIRATION BLADDER Height & Weight     Self, Time, Situation, Place  Normal Continent Weight: 188 lb 7.9 oz (85.5 kg) Height:  5\' 1"  (154.9 cm)  BEHAVIORAL SYMPTOMS/MOOD NEUROLOGICAL BOWEL NUTRITION STATUS      Continent Diet (carb modified)  AMBULATORY STATUS COMMUNICATION OF NEEDS Skin   Limited Assist Verbally Surgical wounds                       Personal Care Assistance Level of Assistance  Bathing, Dressing Bathing Assistance: Limited assistance   Dressing Assistance: Limited assistance     Functional Limitations Info             SPECIAL CARE FACTORS FREQUENCY  PT (By licensed PT), OT (By licensed OT)     PT Frequency: 5x/wk OT Frequency: 5x/wk            Contractures      Additional Factors Info  Code Status, Allergies Code Status Info: Full Allergies Info: Codeine, Hydrocodone-acetaminophen, Morphine, Lipitor Atorvastatin, Morphine And Related, Penicillins, Albuterol, Erythromycin, Hydroxyzine Pamoate, Iodine, Simvastatin, Sulfonamide Derivatives           Current Medications (03/29/2017):  This is the current hospital active medication list Current Facility-Administered Medications  Medication Dose Route Frequency Provider Last Rate Last Dose  .  stroke: mapping our early stages of recovery book   Does not apply Once Truett Mainland, DO      . 0.9 %  sodium chloride infusion   Intravenous Continuous Rosalin Hawking, MD 50 mL/hr at 03/28/17 0406    . 0.9 %  sodium chloride infusion   Intravenous Continuous [name], MD 75 mL/hr at 03/27/17 1900    . acetaminophen (TYLENOL) tablet 650 mg  650 mg Oral Q4H PRN Truett Mainland, DO   650 mg at 03/28/17 2353   Or  . acetaminophen (TYLENOL) solution 650 mg  650 mg Per Tube Q4H PRN Stinson,  Tanna Savoy, DO       Or  . acetaminophen (TYLENOL) suppository 650 mg  650 mg Rectal Q4H PRN Truett Mainland, DO      . acetic acid-hydrocortisone(VOSOL-HC) OTIC (EAR) solution 2-1%  3 drop Both EARS TID PRN Cristal Ford, DO      . aspirin EC tablet 81 mg  81 mg Oral Daily Monia Sabal, PA-C      . benzonatate (TESSALON) capsule 100 mg  100 mg Oral TID PRN Jani Gravel, MD   100 mg at 03/28/17 2215  . budesonide (PULMICORT) nebulizer solution 0.25 mg  0.25 mg Nebulization BID Truett Mainland, DO   0.25 mg at 03/28/17 2059  . clonazePAM  (KLONOPIN) tablet 1 mg  1 mg Oral QHS Hammons, Kimberly B, RPH   1 mg at 03/28/17 2200  . clonazePAM (KLONOPIN) tablet 1 mg  1 mg Oral TID PRN Hammons, Kimberly B, RPH   1 mg at 03/29/17 0024  . diclofenac sodium (VOLTAREN) 1 % transdermal gel 2 g  2 g Topical QID PRN Jani Gravel, MD   2 g at 03/28/17 2215  . enoxaparin (LOVENOX) injection 40 mg  40 mg Subcutaneous Q24H Monia Sabal, PA-C   40 mg at 03/28/17 0919  . ezetimibe (ZETIA) tablet 10 mg  10 mg Oral Daily Rosalin Hawking, MD   10 mg at 03/28/17 0920  . famotidine (PEPCID) tablet 20 mg  20 mg Oral BID Truett Mainland, DO   20 mg at 03/28/17 2213  . fluticasone (FLONASE) 50 MCG/ACT nasal spray 2 spray  2 spray Each Nare Daily Mariel Aloe, MD      . levalbuterol (XOPENEX) nebulizer solution 0.63 mg  0.63 mg Nebulization Q8H PRN Jani Gravel, MD   0.63 mg at 03/22/17 2316  . levothyroxine (SYNTHROID, LEVOTHROID) tablet 50 mcg  50 mcg Oral QAC breakfast Truett Mainland, DO   50 mcg at 03/28/17 0920  . losartan (COZAAR) tablet 50 mg  50 mg Oral Daily Jani Gravel, MD   50 mg at 03/28/17 0920  . ondansetron (ZOFRAN) injection 4 mg  4 mg Intravenous Q6H PRN Cristal Ford, DO   4 mg at 03/27/17 0409  . rosuvastatin (CRESTOR) tablet 40 mg  40 mg Oral q1800 Rosalin Hawking, MD   40 mg at 03/28/17 1710  . senna-docusate (Senokot-S) tablet 1 tablet  1 tablet Oral QHS PRN Truett Mainland, DO      . sodium chloride (OCEAN) 0.65 % nasal spray 1 spray  1 spray Each Nare PRN Opyd, Ilene Qua, MD   1 spray at 03/24/17 2258  . ticagrelor (BRILINTA) tablet 90 mg  90 mg Oral BID [name], MD   90 mg at 03/28/17 2213  . traMADol (ULTRAM) tablet 50 mg  50 mg Oral Q6H PRN Mariel Aloe, MD         Discharge Medications: Please see discharge summary for a list of discharge medications.  Relevant Imaging Results:  Relevant Lab Results:   Additional Information SS#: [id number]  Geralynn Ochs, LCSW

## 2017-03-30 DIAGNOSIS — K5900 Constipation, unspecified: Secondary | ICD-10-CM

## 2017-03-30 NOTE — Progress Notes (Signed)
PROGRESS NOTE    Kathleen Dunn  [id number]:[id number] DOB: 01-Feb-1941 DOA: 03/15/2017 PCP: Hali Marry, MD   Brief Narrative: Kathleen Dunn is a 76 y.o. chemotherapy history of hypertension, hypokalemia, generalized anxiety disorder. Patient presents intermittent shaking and was found to have a acute CVA.   Assessment & Plan:   Principal Problem:   Cerebellar stroke (HCC) Active Problems:   Hypothyroidism   HYPERCHOLESTEROLEMIA   HYPERTENSION, BENIGN SYSTEMIC   GAD (generalized anxiety disorder)   Mixed hyperlipidemia   Basilar artery stenosis   Bilateral carotid artery stenosis   Cerebrovascular accident (CVA) due to thrombosis of precerebral artery (HCC)   Basilar artery stenosis/occlusion with infarction ([place])   Acute cerebellar stroke Secondary to basilar artery stenosis. CVA seen on MRI. Angiogram significant for proximal basilar artery stenosis and 95%. S/p basilar artery angioplasty; residual patency of 65-70% -Interventional radiology recommendations: outpatient follow-up -Neurology recommendations: outpatient follow-up -Continue aspirin and Plavix -PT/OT recommendations: SNF  Essential hypertension -Continue metoprolol  Hypothyroidism -Continue Synthroid  Anxiety -Continue Klonopin when necessary  Right knee pain Secondary to arthritis. -Continue Voltaren gel  Headache Chronic. -continue Tylenol prn -continue home Tramdol  Post-nasal drip -continue flonase  Constipation Improved with enema.  DVT prophylaxis: Lovenox Code Status: Full code Family Communication: None at bedside Disposition Plan: Medically stable for discharge. Discharge when SNF bed available   Consultants:   Interventional radiology  Neurology/stroke team  Procedures:   Echocardiogram: Grade 1 diastolic dysfunction, EF of 60-65%, moderate LVH  Antimicrobials:  None    Subjective: Headache.  Objective: Vitals:   03/29/17 2107 03/30/17 0040 03/30/17  0521 03/30/17 1041  BP: 132/70 (!) 102/52 114/79 129/63  Pulse: 88 76 68 75  Resp: 18 18 20 18   Temp: 98 F (36.7 C) 98.5 F (36.9 C) 98.6 F (37 C) 97.7 F (36.5 C)  TempSrc: Oral Oral Oral Oral  SpO2: 97% 94% 94% 96%  Weight:      Height:        Intake/Output Summary (Last 24 hours) at 03/30/17 1322 Last data filed at 03/29/17 1350  Gross per 24 hour  Intake              240 ml  Output                0 ml  Net              240 ml   Filed Weights   03/15/17 0726 03/26/17 1300 03/26/17 1730  Weight: 86.2 kg (190 lb) 86.2 kg (190 lb 0.6 oz) 85.5 kg (188 lb 7.9 oz)    Examination:  General exam: Comfortable. No distress Abdomen: soft, non-tender    Data Reviewed: I have personally reviewed following labs and imaging studies  CBC:  Recent Labs Lab 03/26/17 0605 03/27/17 0634  WBC 8.6 8.4  NEUTROABS 5.3 5.7  HGB 12.7 12.2  HCT 38.2 37.2  MCV 93.9 95.6  PLT 243 270   Basic Metabolic Panel:  Recent Labs Lab 03/26/17 0605 03/27/17 0634  NA 138 137  K 3.7 3.8  CL 102 104  CO2 27 24  GLUCOSE 108* 109*  BUN 11 6  CREATININE 0.88 0.78  CALCIUM 8.9 8.7*   GFR: Estimated Creatinine Clearance: 61.3 mL/min (by C-G formula based on SCr of 0.78 mg/dL). Liver Function Tests:  Recent Labs Lab 03/26/17 0605  AST 32  ALT 43  ALKPHOS 42  BILITOT 1.0  PROT 6.1*  ALBUMIN 3.5   No results for input(s):  LIPASE, AMYLASE in the last 168 hours. No results for input(s): AMMONIA in the last 168 hours. Coagulation Profile:  Recent Labs Lab 03/26/17 0605  INR 1.03   Cardiac Enzymes: No results for input(s): CKTOTAL, CKMB, CKMBINDEX, TROPONINI in the last 168 hours. BNP (last 3 results) No results for input(s): PROBNP in the last 8760 hours. HbA1C: No results for input(s): HGBA1C in the last 72 hours. CBG:  Recent Labs Lab 03/26/17 0824 03/29/17 1127  GLUCAP 89 119*       Radiology Studies: No results found.      Scheduled Meds: .   stroke: mapping our early stages of recovery book   Does not apply Once  . aspirin EC  81 mg Oral Daily  . budesonide (PULMICORT) nebulizer solution  0.25 mg Nebulization BID  . clonazePAM  1 mg Oral QHS  . enoxaparin (LOVENOX) injection  40 mg Subcutaneous Q24H  . ezetimibe  10 mg Oral Daily  . famotidine  20 mg Oral BID  . fluticasone  2 spray Each Nare Daily  . levothyroxine  50 mcg Oral QAC breakfast  . losartan  50 mg Oral Daily  . rosuvastatin  40 mg Oral q1800  . ticagrelor  90 mg Oral BID   Continuous Infusions:    LOS: 14 days     Cordelia Poche, MD [facility] 03/30/2017, 1:22 PM Pager: [phone]  If 7PM-7AM, please contact night-coverage [email or web address] Password [email or web address] 03/30/2017, 1:22 PM

## 2017-03-31 ENCOUNTER — Encounter (HOSPITAL_COMMUNITY): Payer: Self-pay | Admitting: Interventional Radiology

## 2017-03-31 DIAGNOSIS — R0689 Other abnormalities of breathing: Secondary | ICD-10-CM

## 2017-03-31 LAB — GLUCOSE, CAPILLARY: Glucose-Capillary: 93 mg/dL (ref 65–99)

## 2017-03-31 LAB — TRIGLYCERIDES: Triglycerides: 218 mg/dL — ABNORMAL HIGH (ref <150)

## 2017-03-31 NOTE — Progress Notes (Signed)
PROGRESS NOTE    Kathleen Dunn  YDX:[id number] DOB: 1941-01-15 DOA: 03/15/2017 PCP: Hali Marry, MD   Brief Narrative: Kathleen Dunn is a 76 y.o. chemotherapy history of hypertension, hypokalemia, generalized anxiety disorder. Patient presents intermittent shaking and was found to have a acute CVA. Patient is s/p basilar artery angioplasty with residual patency of 65-70%.   Assessment & Plan:   Principal Problem:   Cerebellar stroke (HCC) Active Problems:   Hypothyroidism   HYPERCHOLESTEROLEMIA   HYPERTENSION, BENIGN SYSTEMIC   GAD (generalized anxiety disorder)   Mixed hyperlipidemia   Basilar artery stenosis   Bilateral carotid artery stenosis   Cerebrovascular accident (CVA) due to thrombosis of precerebral artery (HCC)   Basilar artery stenosis/occlusion with infarction ([place])   Acute cerebellar stroke Secondary to basilar artery stenosis. CVA seen on MRI. Angiogram significant for proximal basilar artery stenosis and 95%. S/p basilar artery angioplasty; residual patency of 65-70% -Interventional radiology recommendations: outpatient follow-up -Neurology recommendations: outpatient follow-up -Continue aspirin and Brilinta -PT/OT recommendations: SNF  Essential hypertension -Continue metoprolol  Hypothyroidism -Continue Synthroid  Anxiety -Continue Klonopin when necessary  Right knee pain Secondary to arthritis. -Continue Voltaren gel  Headache Chronic. -continue Tylenol prn -continue home Tramdol  Post-nasal drip -continue flonase  Constipation Improved with enema.  DVT prophylaxis: Lovenox Code Status: Full code Family Communication: None at bedside Disposition Plan: Medically stable for discharge. Discharge when SNF bed available   Consultants:   Interventional radiology  Neurology/stroke team  Procedures:   Echocardiogram: Grade 1 diastolic dysfunction, EF of 60-65%, moderate LVH  Antimicrobials:  None     Subjective: Afebrile.  Objective: Vitals:   03/30/17 2055 03/31/17 0106 03/31/17 0430 03/31/17 0903  BP: 131/63 (!) 117/51 122/71 (!) 103/53  Pulse: 66 68 73 67  Resp: 18 18 20 20   Temp: 98.6 F (37 C) 97.8 F (36.6 C) (!) 97.5 F (36.4 C) 97.7 F (36.5 C)  TempSrc: Oral Oral Oral Oral  SpO2: 96% 96% 96% 95%  Weight:      Height:        Intake/Output Summary (Last 24 hours) at 03/31/17 1409 Last data filed at 03/31/17 0941  Gross per 24 hour  Intake              120 ml  Output                0 ml  Net              120 ml   Filed Weights   03/15/17 0726 03/26/17 1300 03/26/17 1730  Weight: 86.2 kg (190 lb) 86.2 kg (190 lb 0.6 oz) 85.5 kg (188 lb 7.9 oz)    Examination:  General exam: Comfortable. No distress Respiratory: unlabored    Data Reviewed: I have personally reviewed following labs and imaging studies  CBC:  Recent Labs Lab 03/26/17 0605 03/27/17 0634  WBC 8.6 8.4  NEUTROABS 5.3 5.7  HGB 12.7 12.2  HCT 38.2 37.2  MCV 93.9 95.6  PLT 243 720   Basic Metabolic Panel:  Recent Labs Lab 03/26/17 0605 03/27/17 0634  NA 138 137  K 3.7 3.8  CL 102 104  CO2 27 24  GLUCOSE 108* 109*  BUN 11 6  CREATININE 0.88 0.78  CALCIUM 8.9 8.7*   GFR: Estimated Creatinine Clearance: 61.3 mL/min (by C-G formula based on SCr of 0.78 mg/dL). Liver Function Tests:  Recent Labs Lab 03/26/17 0605  AST 32  ALT 43  ALKPHOS 42  BILITOT 1.0  PROT 6.1*  ALBUMIN 3.5   No results for input(s): LIPASE, AMYLASE in the last 168 hours. No results for input(s): AMMONIA in the last 168 hours. Coagulation Profile:  Recent Labs Lab 03/26/17 0605  INR 1.03   Cardiac Enzymes: No results for input(s): CKTOTAL, CKMB, CKMBINDEX, TROPONINI in the last 168 hours. BNP (last 3 results) No results for input(s): PROBNP in the last 8760 hours. HbA1C: No results for input(s): HGBA1C in the last 72 hours. CBG:  Recent Labs Lab 03/26/17 0824 03/29/17 1127  03/31/17 1137  GLUCAP 89 119* 93       Radiology Studies: No results found.      Scheduled Meds: .  stroke: mapping our early stages of recovery book   Does not apply Once  . aspirin EC  81 mg Oral Daily  . budesonide (PULMICORT) nebulizer solution  0.25 mg Nebulization BID  . clonazePAM  1 mg Oral QHS  . enoxaparin (LOVENOX) injection  40 mg Subcutaneous Q24H  . ezetimibe  10 mg Oral Daily  . famotidine  20 mg Oral BID  . fluticasone  2 spray Each Nare Daily  . levothyroxine  50 mcg Oral QAC breakfast  . losartan  50 mg Oral Daily  . rosuvastatin  40 mg Oral q1800  . ticagrelor  90 mg Oral BID   Continuous Infusions:    LOS: 15 days     Cordelia Poche, MD [facility] 03/31/2017, 2:09 PM Pager: [phone]  If 7PM-7AM, please contact night-coverage [email or web address] Password [email or web address] 03/31/2017, 2:09 PM

## 2017-03-31 NOTE — Progress Notes (Signed)
Physical Therapy Treatment Patient Details Name: Kathleen Dunn MRN: [id number] DOB: 1941-02-03 Today's Date: 03/31/2017    History of Present Illness Pt is a 76 yo female admitted through ED on 03/15/17 due to an episode of L arm twisting, dizziness and fatigue. Pt was diagnosed with subacute cerebellar stroke. PMH significant for asthma, HTN, thyroid disease, anxiety.      PT Comments    Patient seen for extensive session and limited overall activity. Patient with increased anxiety throughout session stating fears of returning home and fears of leaving hospital. Spoke with patient at length regarding fears. Patient inquiring about aquatic therapy, explained to patient at length concerns regarding why patient would not be a candidate for aquatic therapy at this time given current impairments and symptoms and recent stroke. Educated patient on realistic goals for therapy and spoke with patient at length regarding what post acute rehabilitation would entail. Patient fluctuating extensively and contradicting concerns throughout session.  Spoke with patient at length regarding common symptoms associated with cerebellar dysfunction as patient concerned regarding intermittent nausea with activity, shakiness, unsteadiness, and intermittent headaches as patient was concerned that these were not being addressed. Reassured patient that these symptoms had been documented and reported to nsg and physician and attempted to comfort patient.   Spoke with patient at length regarding concerns for mobility and functional recovery. Patient initially receptive to pursing rehabilitation and states that no offers had been provided to her. Advised that I would follow up with LCSW to ensure that post acute options were pursued, returned to room with LSCW who attempted to provide bed offers to patient who again provided conflicting information and initially refused to discuss options, but then appeared receptive. Left  patient with LCSW at bedside to discuss rehab options for post acute discharge.  At this time, feel patient would benefit from continued skilled therapy but unclear if patient is truly receptive to rehabilitation.   Follow Up Recommendations  SNF     Equipment Recommendations  Cane    Recommendations for Other Services       Precautions / Restrictions Precautions Precautions: Fall Restrictions Weight Bearing Restrictions: No    Mobility  Bed Mobility Overal bed mobility: Modified Independent             General bed mobility comments: use of rail  Transfers Overall transfer level: Needs assistance Equipment used: None Transfers: Sit to/from Stand Sit to Stand: Min guard         General transfer comment: increased time, no physical assist required, min guard for safety performed from toilet x3 and bed x2.  Ambulation/Gait Ambulation/Gait assistance: Min assist Ambulation Distance (Feet): 20 Feet (x6) Assistive device: None (refusing use of assistive device) Gait Pattern/deviations: Step-through pattern;Decreased step length - right;Decreased step length - left;Decreased stride length;Shuffle, Ataxia Gait velocity: decreased Gait velocity interpretation: <1.8 ft/sec, indicative of risk for recurrent falls General Gait Details: patient refusing use of assistive device, states she would rather hold on to walls and rails, during session ambulating in room with min guard with ocassional instability and need for self steadying. Ambulating back in forth in room.Patient shows signs of ataxia consistent with cerebellar CVA.   Stairs            Wheelchair Mobility    Modified Rankin (Stroke Patients Only) Modified Rankin (Stroke Patients Only) Pre-Morbid Rankin Score: No symptoms Modified Rankin: Moderately severe disability     Balance Overall balance assessment: Needs assistance Sitting-balance support: No upper extremity supported;Feet supported Sitting  balance-Leahy Scale: Good Sitting balance - Comments: pt sitting EOB with supervision   Standing balance support: During functional activity;No upper extremity supported Standing balance-Leahy Scale: Fair Standing balance comment: patient performing functional tasks without physical assist, hygiene and pericare, hand washing, manipulation of clothing etc. Some increased sway noted.                 Standardized Balance Assessment Standardized Balance Assessment : Dynamic Gait Index   Dynamic Gait Index Level Surface: Normal Change in Gait Speed: Normal Gait with Horizontal Head Turns: Normal Gait with Vertical Head Turns: Normal Gait and Pivot Turn: Normal Step Over Obstacle: Mild Impairment Step Around Obstacles: Normal Steps: Moderate Impairment Total Score: 21      Cognition Arousal/Alertness: Awake/alert Behavior During Therapy: Anxious Overall Cognitive Status: No family/caregiver present to determine baseline cognitive functioning                                 General Comments: patient extremely tangential throughout session, providing conflicting information and backtracking through her information. Patient fluctuating between recpetive and opposing throughout session.       Exercises      General Comments General comments (skin integrity, edema, etc.): patient continues to feel unsafe about returning home      Pertinent Vitals/Pain Pain Assessment: Faces Pain Score: 2  Faces Pain Scale: No hurt Pain Location: head  Pain Descriptors / Indicators: Headache Pain Intervention(s): Monitored during session    Home Living                      Prior Function            PT Goals (current goals can now be found in the care plan section) Acute Rehab PT Goals Patient Stated Goal: to get stronger again PT Goal Formulation: With patient Time For Goal Achievement: 03/30/17 Potential to Achieve Goals: Good Progress towards PT goals:  Progressing toward goals    Frequency    Min 3X/week      PT Plan Discharge plan needs to be updated    Co-evaluation              AM-PAC PT "6 Clicks" Daily Activity  Outcome Measure  Difficulty turning over in bed (including adjusting bedclothes, sheets and blankets)?: None Difficulty moving from lying on back to sitting on the side of the bed? : None Difficulty sitting down on and standing up from a chair with arms (e.g., wheelchair, bedside commode, etc,.)?: A Little Help needed moving to and from a bed to chair (including a wheelchair)?: A Little Help needed walking in hospital room?: A Little Help needed climbing 3-5 steps with a railing? : A Little 6 Click Score: 20    End of Session Equipment Utilized During Treatment: Gait belt Activity Tolerance: Patient tolerated treatment well Patient left: in bed;with call bell/phone within reach;with bed alarm set;with SCD's reapplied Nurse Communication: Mobility status PT Visit Diagnosis: Unsteadiness on feet (R26.81)     Time: 2671-2458 PT Time Calculation (min) (ACUTE ONLY): 37 min  Charges:  $Gait Training: 8-22 mins $Self Care/Home Management: 8-22                    G Codes:       Alben Deeds, PT DPT [facility] [place] 03/31/2017, 4:25 PM

## 2017-03-31 NOTE — Care Management Note (Signed)
Case Management Note  Patient Details  Name: Kathleen Dunn MRN: [id number] Date of Birth: 18-May-1941  Subjective/Objective:     CM following for progression and d/c planning.                Action/Plan: 03/31/2017 Met with pt who is considering appealing her d/c. This CM explained the process for this to the pt . Pt MD notified that in order for the pt to appeal her d/c , he must first write a d/c order. This CM also explained to pt that she may receive an ABN if she ready for d/c and refuses to go resulting in the risk of nonpayment by her insurance. These options were discussed in detail with the pt, who now insist that she must have a facility with aquatic therapy. The pt has been informed that she has a bed offer from [facility] and that that facility has a pool. The pt now states that she can not d/c to a facility that had anything to do with [facility] [employer], as [name] is formally [employer]. CSW working with this pt . Will issue an ABN if appropriate. Have asked MD who has stated that his plan is to d/c this pt today, to please enter there order so that we may issue a ABN or the pt may appeal the d/c order.   Expected Discharge Date:                  Expected Discharge Plan:  [name]  In-House Referral:     Discharge planning Services  CM Consult  Post Acute Care Choice:    Choice offered to:     DME Arranged:    DME Agency:     HH Arranged:    [place] Agency:     Status of Service:  In process, will continue to follow  If discussed at Long Length of Stay Meetings, dates discussed:    Additional Comments:  Adron Bene, RN 03/31/2017, 4:09 PM

## 2017-03-31 NOTE — Care Management Important Message (Signed)
Important Message  Patient Details  Name: Kathleen Dunn MRN: [id number] Date of Birth: 04/17/1941   Medicare Important Message Given:  Yes    Dynasia Kercheval [address] 03/31/2017, 2:34 PM

## 2017-03-31 NOTE — Progress Notes (Signed)
CSW met with patient to discuss discharge planning and determine facility placement. Patient discussed that she was feeling really nauseous today and didn't want to have to talk about discharge planning. Patient indicated that she knew her Medicare rights and that she didn't have to leave the hospital until she was ready. CSW explained that it would be beneficial for her to start her rehabilitation, so that she can start getting stronger and back to doing the things at home that she wants to do. Patient discussed that she wasn't even sure yet if she wanted to go to rehab, she might want to go home. Patient indicated that she wanted to keep all of her options open and not decide on anything until she felt that she was ready to leave the hospital. Patient refused to engage further in discussion, and said that she would like CSW to come back later when she was feeling better.  CSW alerted RNCM to discuss the Medicare rights and appeal process with the patient, as patient is medically ready for discharge but refusing to engage in discharge planning discussions.  Laveda Abbe, [place] Clinical Social Worker [phone]

## 2017-04-01 DIAGNOSIS — G464 Cerebellar stroke syndrome: Secondary | ICD-10-CM | POA: Diagnosis not present

## 2017-04-01 DIAGNOSIS — G8190 Hemiplegia, unspecified affecting unspecified side: Secondary | ICD-10-CM | POA: Diagnosis not present

## 2017-04-01 DIAGNOSIS — R41841 Cognitive communication deficit: Secondary | ICD-10-CM | POA: Diagnosis not present

## 2017-04-01 DIAGNOSIS — K5900 Constipation, unspecified: Secondary | ICD-10-CM | POA: Diagnosis not present

## 2017-04-01 DIAGNOSIS — F419 Anxiety disorder, unspecified: Secondary | ICD-10-CM | POA: Diagnosis not present

## 2017-04-01 DIAGNOSIS — M6281 Muscle weakness (generalized): Secondary | ICD-10-CM | POA: Diagnosis not present

## 2017-04-01 DIAGNOSIS — R5381 Other malaise: Secondary | ICD-10-CM | POA: Diagnosis not present

## 2017-04-01 DIAGNOSIS — M25569 Pain in unspecified knee: Secondary | ICD-10-CM | POA: Diagnosis not present

## 2017-04-01 DIAGNOSIS — I10 Essential (primary) hypertension: Secondary | ICD-10-CM | POA: Diagnosis not present

## 2017-04-01 DIAGNOSIS — I639 Cerebral infarction, unspecified: Secondary | ICD-10-CM | POA: Diagnosis not present

## 2017-04-01 DIAGNOSIS — I6309 Cerebral infarction due to thrombosis of other precerebral artery: Secondary | ICD-10-CM | POA: Diagnosis not present

## 2017-04-01 DIAGNOSIS — I651 Occlusion and stenosis of basilar artery: Secondary | ICD-10-CM | POA: Diagnosis not present

## 2017-04-01 DIAGNOSIS — F411 Generalized anxiety disorder: Secondary | ICD-10-CM | POA: Diagnosis not present

## 2017-04-01 DIAGNOSIS — I688 Other cerebrovascular disorders in diseases classified elsewhere: Secondary | ICD-10-CM | POA: Diagnosis not present

## 2017-04-01 DIAGNOSIS — G4700 Insomnia, unspecified: Secondary | ICD-10-CM | POA: Diagnosis not present

## 2017-04-01 DIAGNOSIS — R2681 Unsteadiness on feet: Secondary | ICD-10-CM | POA: Diagnosis not present

## 2017-04-01 DIAGNOSIS — R278 Other lack of coordination: Secondary | ICD-10-CM | POA: Diagnosis not present

## 2017-04-01 MED ORDER — TRAMADOL HCL 50 MG PO TABS: 50.0000 mg | ORAL_TABLET | Freq: Two times a day (BID) | ORAL | 0 refills | Status: DC | PRN

## 2017-04-01 MED ORDER — ASPIRIN 81 MG PO TBEC: 81.0000 mg | DELAYED_RELEASE_TABLET | Freq: Every day | ORAL | Status: AC

## 2017-04-01 NOTE — Discharge Summary (Signed)
Physician Discharge Summary  Kathleen Dunn QPY:[id number] DOB: 12/11/1940 DOA: 03/15/2017  PCP: Hali Marry, MD  Admit date: 03/15/2017 Discharge date: 04/01/2017  Admitted From: Home Disposition: SNF  Recommendations for Outpatient Follow-up:  1. Follow up with PCP in 1 week 2. Follow up with Interventional radiology in 1 week 3. Please follow up on the following pending results: None  Home Health: SNF Equipment/Devices: SNF  Discharge Condition: Stable CODE STATUS: Full code Diet recommendation: Heart healthy   Brief/Interim Summary:  Admission HPI written by Sherwood Gambler, MD   Chief Complaint: lightheadedness  HPI: Kathleen Dunn is a 76 y.o. female with a history of asthma, hypertension, thyroid disease, anxiety, on Desogen therapy for hormone replacement. Patient was seen at [facility] for near syncope on June 14 and received a head CT which showed no acute intracranial abnormality but evidence of an old small left cerebellar infarct with small vessel ischemic changes in the white matter. Over the past 2 days the patient has had intermittent shaking and "twisting" of her left hand which last for several minutes. She has been feeling lightheaded over the past few days and took Dramamine twice, which preceded the episodes of shaking and twisting. She denies vertigo, chest pain, weakness, sensory changes. Her CT scan today showed worsening areas of low density in the cerebellum consistent with small acute/subacute cerebellar infarctions.    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 CVA seen on MRI. Angiogram significant for proximal basilar artery stenosis and 95%. S/p basilar artery angioplasty; residual patency of 65-70%. Plavix discontinued and Brilinta added in addition to continuing aspirin. Outpatient follow-up with neurology and vascular surgery.  Essential hypertension Continued metoprolol.  Hypothyroidism Continued  Synthroid.  Anxiety Continued Klonopin when necessary.  Right knee pain Secondary to arthritis. Continued Voltaren gel.  Headache Chronic. Continued Tylenol and Tramadol  Post-nasal drip Continued Flonase.  Constipation Improved with enema. Continue Senna.  Discharge Diagnoses:  Principal Problem:   Cerebellar stroke [facility]) Active Problems:   Hypothyroidism   HYPERCHOLESTEROLEMIA   HYPERTENSION, BENIGN SYSTEMIC   GAD (generalized anxiety disorder)   Mixed hyperlipidemia   Basilar artery stenosis   Bilateral carotid artery stenosis   Cerebrovascular accident (CVA) due to thrombosis of precerebral artery (HCC)   Basilar artery stenosis/occlusion with infarction [facility])    Discharge Instructions   Allergies as of 04/01/2017      Reactions   Codeine Shortness Of Breath, Other (See Comments)   Chest pain and swelling   Hydrocodone-acetaminophen Anaphylaxis   Morphine Shortness Of Breath, Other (See Comments)   Chest swelling   Lipitor [atorvastatin] Other (See Comments)   Myalgias   Morphine And Related Rash   Penicillins Rash, Other (See Comments)   PATIENT HAS HAD A PCN REACTION WITH IMMEDIATE RASH, FACIAL/TONGUE/THROAT SWELLING, SOB, OR LIGHTHEADEDNESS WITH HYPOTENSION:  #  #  #  YES  #  #  #   Has patient had a PCN reaction causing severe rash involving mucus membranes or skin necrosis: No Has patient had a PCN reaction that required hospitalization: No Has patient had a PCN reaction occurring within the last 10 years: No If all of the above answers are "NO", then may proceed with Cephalosporin use.   Albuterol Other (See Comments)   Shaking (1 puff does not cause the shaking)   Erythromycin Rash   Hydroxyzine Pamoate Nausea And Vomiting       Iodine Rash   Simvastatin Other (See Comments)   Stomach ache  Sulfonamide Derivatives Rash      Medication List    STOP taking these medications   gabapentin 100 MG capsule Commonly known as:  NEURONTIN    lovastatin 40 MG tablet Commonly known as:  MEVACOR   meloxicam 7.5 MG tablet Commonly known as:  MOBIC   predniSONE 5 MG tablet Commonly known as:  DELTASONE   triamterene-hydrochlorothiazide 37.5-25 MG capsule Commonly known as:  DYAZIDE     TAKE these medications   acetaminophen 500 MG tablet Commonly known as:  TYLENOL Take 250-500 mg by mouth every 6 (six) hours as needed for headache (pain).   acetic acid-hydrocortisone OTIC solution Commonly known as:  VOSOL-HC Place 3 drops into both ears 3 (three) times daily. What changed:  when to take this  reasons to take this   albuterol 108 (90 Base) MCG/ACT inhaler Commonly known as:  PROVENTIL HFA;VENTOLIN HFA Inhale 1 puff into the lungs every 6 (six) hours as needed for wheezing or shortness of breath.   aspirin 81 MG EC tablet Take 1 tablet (81 mg total) by mouth daily.   benzonatate 100 MG capsule Commonly known as:  TESSALON Take 1-2 capsules (100-200 mg total) by mouth 3 (three) times daily as needed for cough.   Betamethasone Valerate 0.12 % foam Apply 1 application topically daily as needed (scalp itching).   clonazePAM 1 MG tablet Commonly known as:  KLONOPIN Take 1 tablet (1 mg total) by mouth 3 (three) times daily as needed (anxiety, insomnia). What changed:  when to take this  reasons to take this   DEPO-ESTRADIOL 5 MG/ML injection Generic drug:  estradiol cypionate Inject 1 mL (5 mg total) into the muscle every 28 (twenty-eight) days. What changed:  how much to take  when to take this  additional instructions   ECHINACEA-GOLDEN SEAL PO Take 1 capsule by mouth 2 (two) times daily as needed (congestion).   ezetimibe 10 MG tablet Commonly known as:  ZETIA Take 1 tablet (10 mg total) by mouth daily.   ketoconazole 2 % shampoo Commonly known as:  NIZORAL APPLY TO AFFECTED AREA TWICE A WEEK What changed:  how much to take  how to take this  when to take this  additional  instructions   levothyroxine 50 MCG tablet Commonly known as:  SYNTHROID, LEVOTHROID Take 1 tablet (50 mcg total) by mouth daily. NEED FOLLOW UP VISIT FOR MORE REFILLS   losartan 50 MG tablet Commonly known as:  COZAAR Take 1 tablet (50 mg total) by mouth daily. What changed:  medication strength  how much to take  additional instructions   metoprolol tartrate 25 MG tablet Commonly known as:  LOPRESSOR Take 1 tablet (25 mg total) by mouth daily. What changed:  when to take this   mometasone 50 MCG/ACT nasal spray Commonly known as:  NASONEX Place 2 sprays into the nose daily as needed (congestion).   pantoprazole 40 MG tablet Commonly known as:  PROTONIX Take 40 mg by mouth at bedtime.   QVAR 40 MCG/ACT inhaler Generic drug:  beclomethasone USE 2 INHALATIONS TWICE A DAY What changed:  See the new instructions.   ranitidine 150 MG capsule Commonly known as:  ZANTAC TAKE 1 CAPSULE TWICE A DAY AS NEEDED FOR HEARTBURN What changed:  how much to take  how to take this  when to take this  reasons to take this  additional instructions   rosuvastatin 40 MG tablet Commonly known as:  CRESTOR Take 1 tablet (40 mg total) by  mouth daily at 6 PM.   ticagrelor 90 MG Tabs tablet Commonly known as:  BRILINTA Take 1 tablet (90 mg total) by mouth 2 (two) times daily.   traMADol 50 MG tablet Commonly known as:  ULTRAM Take 1 tablet (50 mg total) by mouth every 12 (twelve) hours as needed (knee pain).   Vitamin D3 3000 units Tabs Take 3,000 Units by mouth daily. What changed:  Another medication with the same name was removed. Continue taking this medication, and follow the directions you see here.      Follow-up Information    Kathleen Bras, MD Follow up in 1 week(s).   Specialty:  Interventional Radiology Why:  pt will hear from scheduler for time and date of 2 week follow up with Dr Estanislado Pandy; call [phone] if questions or concerns Contact  information: [address] [place] [address] [phone]        Garvin Fila, MD. Schedule an appointment as soon as possible for a visit in 5 week(s).   Specialties:  Neurology, Radiology Contact information: [address] [place] [address] [phone]          Allergies  Allergen Reactions  . Codeine Shortness Of Breath and Other (See Comments)    Chest pain and swelling  . Hydrocodone-Acetaminophen Anaphylaxis  . Morphine Shortness Of Breath and Other (See Comments)    Chest swelling  . Lipitor [Atorvastatin] Other (See Comments)    Myalgias  . Morphine And Related Rash  . Penicillins Rash and Other (See Comments)     PATIENT HAS HAD A PCN REACTION WITH IMMEDIATE RASH, FACIAL/TONGUE/THROAT SWELLING, SOB, OR LIGHTHEADEDNESS WITH HYPOTENSION:  #  #  #  YES  #  #  #   Has patient had a PCN reaction causing severe rash involving mucus membranes or skin necrosis: No Has patient had a PCN reaction that required hospitalization: No Has patient had a PCN reaction occurring within the last 10 years: No If all of the above answers are "NO", then may proceed with Cephalosporin use.  . Albuterol Other (See Comments)    Shaking (1 puff does not cause the shaking)  . Erythromycin Rash  . Hydroxyzine Pamoate Nausea And Vomiting       . Iodine Rash  . Simvastatin Other (See Comments)    Stomach ache  . Sulfonamide Derivatives Rash    Consultations:  Neurology/Stroke  Vascular surgery   Procedures/Studies: Ct Angio Head W Or Wo Contrast  Result Date: 03/17/2017 CLINICAL DATA:  Stroke follow-up. EXAM: CT ANGIOGRAPHY HEAD AND NECK TECHNIQUE: Multidetector CT imaging of the head and neck was performed using the standard protocol during bolus administration of intravenous contrast. Multiplanar CT image reconstructions and MIPs were obtained to evaluate the vascular anatomy. Carotid stenosis measurements (when applicable) are obtained  utilizing NASCET criteria, using the distal internal carotid diameter as the denominator. CONTRAST:  50 cc Isovue 350 intravenous COMPARISON:  Brain MRI 03/15/2017 FINDINGS: CT HEAD FINDINGS Brain: Patchy acute to subacute infarcts in the bilateral cerebrum and parasagittal left occipital lobe as seen on previous brain MRI. No evidence of infarct progression. No hemorrhagic conversion. No hydrocephalus. Chronic microvascular ischemia in the cerebral white matter. Vascular: See below Skull: No acute or aggressive finding Sinuses: Negative Orbits: Negative Review of the MIP images confirms the above findings CTA NECK FINDINGS Aortic arch: Atherosclerosis without aneurysm or acute finding. Three vessel branching. Right carotid system: Atheromatous plaque at the common carotid origin without  stenosis. There is prominent noncalcified plaque on the common carotid distally; stenosis measures 40-50%. Moderate calcified plaque at the common carotid bifurcation with stenosis measuring up to 60%. No superimposed ulceration. ICA tortuosity with vessel reaching the midline retropharynx. Left carotid system: Comparatively mild calcified and noncalcified plaque at the common carotid bifurcation and ICA bulb without stenosis or ulceration. Prominent ICA tortuosity reaching the midline retropharynx. Vertebral arteries: Left dominant vertebral artery that is widely patent to the dura. Thready flow in the diminutive right vertebral artery. Skeleton: No acute or aggressive finding. Other neck: No evidence of incidental mass or inflammation. Upper chest: Calcified granuloma in the left upper lobe. Review of the MIP images confirms the above findings CTA HEAD FINDINGS Anterior circulation: Calcified plaquing in the carotid siphons without flow limiting stenosis. Hypoplastic right A1 segment. No major branch occlusion is seen. There is atheromatous irregularity of bilateral MCA vessels without flow limiting and reversible stenosis.  Posterior circulation: No visible flow within the hypoplastic distal right V4 segment. 2 left vertebral stenoses, mild proximally and moderate distally. At the anticipated level of proximal basilar artery there is severe short segment stenosis or occlusion. The mid basilar shows a more moderate narrowing. The right P1 segment is aplastic with fetal type circulation. There is atheromatous irregularity of the left more than right PCA with up to moderate left P2 segment stenoses Venous sinuses: Patent Anatomic variants: Fetal type right PCA Delayed phase: No abnormal intracranial enhancement. Review of the MIP images confirms the above findings IMPRESSION: 1. Advanced atherosclerosis in the head and neck. 2. Short segment atheromatous occlusion or critical stenosis in the proximal basilar. 3. Moderate atheromatous narrowing in the distal left V4 segment and mid basilar. The hypoplastic right vertebral artery that is effectively occluded at the dura. 4. 60-70% proximal right ICA and 40-50% right common carotid stenosis due to atherosclerotic plaque. 5. Known acute infarcts in the bilateral cerebellum and left occipital lobe (there is a fetal type right PCA). No hemorrhagic conversion or evidence of progression. Electronically Signed   By: Kathleen Dunn M.D.   On: 03/17/2017 14:25   Dg Lumbar Spine Complete  Result Date: 03/15/2017 CLINICAL DATA:  Pt complains of lower back pain x 5 days with fall, near syncopal episode and dizziness x 2 days EXAM: LUMBAR SPINE - COMPLETE 4+ VIEW COMPARISON:  None. FINDINGS: No fracture.  No spondylolisthesis. Moderate to marked loss of disc height at L1-L2 with endplate sclerosis and osteophytes. Mild loss disc height at T12-L1 and L3-L4. There facet degenerative changes most evident in the lower lumbar spine. Dense aortic vascular calcifications are noted. No evidence of an aneurysm. There are other scattered vascular calcifications. Soft tissues otherwise unremarkable.  IMPRESSION: 1. No fracture or acute finding. 2. Degenerative changes as detailed. Electronically Signed   By: Lajean Manes M.D.   On: 03/15/2017 08:26   Ct Head Wo Contrast  Result Date: 03/15/2017 CLINICAL DATA:  Near syncope.  Left arm shaking. EXAM: CT HEAD WITHOUT CONTRAST TECHNIQUE: Contiguous axial images were obtained from the base of the skull through the vertex without intravenous contrast. COMPARISON:  02/27/2017 FINDINGS: Brain: Worsening of areas of low-density in the cerebellar hemispheres, more notable on the right, consistent with subacute cerebellar infarctions. No sign of hemorrhage or swelling. Chronic small-vessel ischemic changes affect the pons. Cerebral hemispheres show chronic small-vessel ischemic changes of the white matter. No cortical supratentorial infarction identified. No mass lesion, hemorrhage, hydrocephalus or extra-axial collection. Vascular: There is atherosclerotic calcification of the major  vessels at the base of the brain. Skull: Negative Sinuses/Orbits: Clear/normal Other: None significant IMPRESSION: Worsening of areas of low-density in the cerebellum consistent with small acute/ subacute cerebellar infarctions. No sign of hemorrhage or mass effect. Electronically Signed   By: Nelson Chimes M.D.   On: 03/15/2017 08:11   Ct Angio Neck W Or Wo Contrast  Result Date: 03/17/2017 CLINICAL DATA:  Stroke follow-up. EXAM: CT ANGIOGRAPHY HEAD AND NECK TECHNIQUE: Multidetector CT imaging of the head and neck was performed using the standard protocol during bolus administration of intravenous contrast. Multiplanar CT image reconstructions and MIPs were obtained to evaluate the vascular anatomy. Carotid stenosis measurements (when applicable) are obtained utilizing NASCET criteria, using the distal internal carotid diameter as the denominator. CONTRAST:  50 cc Isovue 350 intravenous COMPARISON:  Brain MRI 03/15/2017 FINDINGS: CT HEAD FINDINGS Brain: Patchy acute to subacute  infarcts in the bilateral cerebrum and parasagittal left occipital lobe as seen on previous brain MRI. No evidence of infarct progression. No hemorrhagic conversion. No hydrocephalus. Chronic microvascular ischemia in the cerebral white matter. Vascular: See below Skull: No acute or aggressive finding Sinuses: Negative Orbits: Negative Review of the MIP images confirms the above findings CTA NECK FINDINGS Aortic arch: Atherosclerosis without aneurysm or acute finding. Three vessel branching. Right carotid system: Atheromatous plaque at the common carotid origin without stenosis. There is prominent noncalcified plaque on the common carotid distally; stenosis measures 40-50%. Moderate calcified plaque at the common carotid bifurcation with stenosis measuring up to 60%. No superimposed ulceration. ICA tortuosity with vessel reaching the midline retropharynx. Left carotid system: Comparatively mild calcified and noncalcified plaque at the common carotid bifurcation and ICA bulb without stenosis or ulceration. Prominent ICA tortuosity reaching the midline retropharynx. Vertebral arteries: Left dominant vertebral artery that is widely patent to the dura. Thready flow in the diminutive right vertebral artery. Skeleton: No acute or aggressive finding. Other neck: No evidence of incidental mass or inflammation. Upper chest: Calcified granuloma in the left upper lobe. Review of the MIP images confirms the above findings CTA HEAD FINDINGS Anterior circulation: Calcified plaquing in the carotid siphons without flow limiting stenosis. Hypoplastic right A1 segment. No major branch occlusion is seen. There is atheromatous irregularity of bilateral MCA vessels without flow limiting and reversible stenosis. Posterior circulation: No visible flow within the hypoplastic distal right V4 segment. 2 left vertebral stenoses, mild proximally and moderate distally. At the anticipated level of proximal basilar artery there is severe short  segment stenosis or occlusion. The mid basilar shows a more moderate narrowing. The right P1 segment is aplastic with fetal type circulation. There is atheromatous irregularity of the left more than right PCA with up to moderate left P2 segment stenoses Venous sinuses: Patent Anatomic variants: Fetal type right PCA Delayed phase: No abnormal intracranial enhancement. Review of the MIP images confirms the above findings IMPRESSION: 1. Advanced atherosclerosis in the head and neck. 2. Short segment atheromatous occlusion or critical stenosis in the proximal basilar. 3. Moderate atheromatous narrowing in the distal left V4 segment and mid basilar. The hypoplastic right vertebral artery that is effectively occluded at the dura. 4. 60-70% proximal right ICA and 40-50% right common carotid stenosis due to atherosclerotic plaque. 5. Known acute infarcts in the bilateral cerebellum and left occipital lobe (there is a fetal type right PCA). No hemorrhagic conversion or evidence of progression. Electronically Signed   By: Kathleen Dunn M.D.   On: 03/17/2017 14:25   Mr Brain Wo Contrast  Result Date:  03/15/2017 CLINICAL DATA:  77 year old female with with evidence of new cerebellar infarcts from earlier this month on head CT today performed for abnormal left upper extremity movements and near syncope. EXAM: MRI HEAD WITHOUT CONTRAST MRA HEAD WITHOUT CONTRAST TECHNIQUE: Multiplanar, multiecho pulse sequences of the brain and surrounding structures were obtained without intravenous contrast. Angiographic images of the head were obtained using MRA technique without contrast. COMPARISON:  Head CT 0805 hours today.  Head CT 02/27/2017. FINDINGS: MRI HEAD FINDINGS Brain: Patchy restricted diffusion in both cerebellar hemispheres (series 6, image 12). No brainstem involvement. No deep gray matter nuclei involvement, but there are also several scattered small foci of restricted diffusion in the left occipital lobe. Associated T2  and FLAIR hyperintensity. No associated hemorrhage or mass effect. No anterior circulation restricted diffusion. No midline shift, mass effect, evidence of mass lesion, ventriculomegaly, extra-axial collection or acute intracranial hemorrhage. Cervicomedullary junction and pituitary are within normal limits. Patchy bilateral cerebral white matter T2 and FLAIR hyperintensity. No chronic cortical encephalomalacia or chronic cerebral blood products are identified. Signal in the deep gray matter nuclei and brainstem is normal for age. Vascular: Major intracranial vascular flow voids are preserved, the distal left vertebral artery appears dominant and is somewhat dolichoectatic. There is mild generalized intracranial artery tortuosity. Skull and upper cervical spine: Negative. Sinuses/Orbits: Normal orbits soft tissues. Visualized paranasal sinuses and mastoids are stable and well pneumatized. Other: Visible internal auditory structures appear normal. Negative scalp soft tissues. MRA HEAD FINDINGS Antegrade flow in the dominant distal left vertebral artery. No distal left vertebral artery stenosis. Absent flow in the non dominant appearing right vertebral artery V4 segment, although there is faint flow signal in the distal right vertebral artery at the upper cervical spine. The basilar artery is patent, but there is severe proximal basilar stenosis, corresponding to an area of prominent calcified plaque on the CT head today (series 755, image 10). This is proximal to a dominant appearing right AICA origin. The distal basilar is irregular without additional stenosis. SCA and left PCA origins are normal. There is a fetal type right PCA origin. The left posterior communicating artery is diminutive or absent. There is mild irregularity and attenuated distal flow in the left PCA. The distal right PCA appears normal. Antegrade flow in both ICA siphons. Evidence of a tortuous retropharyngeal course of 1 or board carotid  arteries in the neck. No ICA siphon stenosis. Normal ophthalmic artery origins. Patent carotid termini. Normal MCA and ACA origins. Dominant left A1 segment. Anterior communicating artery and visible ACA branches are within normal limits. Visible bilateral MCA branches are within normal limits. IMPRESSION: 1. 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 No associated hemorrhage or mass effect. 2. 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 3. 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 4. Attenuated flow in the distal left PCA. 5. Mild to moderate for age nonspecific cerebral white matter signal changes. 6. Negative anterior circulation. 7. Salient findings discussed by telephone with Dr. Jeanene Dunn on 03/15/2017 at 20:33 . Electronically Signed   By: Kathleen Dunn M.D.   On: 03/15/2017 20:33   Ir Pta Intracranial  Result Date: 03/31/2017 INDICATION: Pre occlusive stenosis of the proximal basilar artery. CLINICAL DATA:  Severe vertebrobasilar ischemic symptoms. Bilateral cerebellar artery strokes due to pre occlusive stenosis of the proximal basilar artery. EXAM: PTA INTRACRANIAL OF THE PROXIMAL BASILAR ARTERY COMPARISON:  Diagnostic catheter arteriogram of 03/20/2017. MRI MRA scan of the brain of 03/15/2017. MEDICATIONS: Vancomycin 1 g IV antibiotic was administered within 1 hour of the procedure. ANESTHESIA/SEDATION: General anesthesia. CONTRAST:  Isovue 300 approximately 80 mL. FLUOROSCOPY TIME:  Fluoroscopy Time: 43 minutes 6 seconds 2606 mGy). COMPLICATIONS: None immediate. TECHNIQUE: Informed written consent was obtained from the patient after a thorough discussion of the procedural risks, benefits and alternatives. All questions were addressed. Maximal Sterile Barrier Technique was utilized including caps, mask, sterile gowns, sterile gloves, sterile drape, hand hygiene and skin  antiseptic. A timeout was performed prior to the initiation of the procedure. The patient was put under general anesthesia by the Department of Anesthesiology at [facility]. The right groin was prepped and draped in the usual sterile fashion. Thereafter using modified Seldinger technique, transfemoral access into the right common femoral artery was obtained without difficulty. Over a 0.035 inch guidewire a 5 French Pinnacle sheath was inserted. Through this, and also over a 0.035 inch guidewire a 5 [place] JB 1 catheter was advanced to the aortic arch region and selectively positioned in the left subclavian artery. An arteriogram was then performed centered extra cranially and intracranially. FINDINGS: The left subclavian arteriogram demonstrates approximately 30% stenosis of the left subclavian artery proximal to the origin of the left vertebral artery. The left vertebral artery proximally again demonstrates moderate tortuosity. More distally the vessel is seen to opacify to the cranial skull base. Normal opacification is seen of the left vertebrobasilar junction and the left posterior-inferior cerebellar artery. 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 The guide catheter was then advanced over a 0.035 inch Roadrunner guidewire to the distal end of the neurovascular sheath. The wire was then manipulated and advanced without difficulty into the proximal left vertebral artery and distally followed by the Alvo guide catheter. Less severe 50% stenosis is seen of the basilar artery proximal to this. The distal basilar artery, the left posterior cerebral artery, the superior cerebellar arteries and the anterior-inferior cerebellar arteries are seen to opacify into the capillary and venous phases. PROCEDURE: The diagnostic JB 1 catheter was advanced to the distal left subclavian artery, and  exchanged over a 0.035 inch 300 cm Rosen exchange guidewire for a 6 French 65 cm Arrow neurovascular sheath. Good aspiration obtained from the side port of the neurovascular sheath. A gentle contrast injection demonstrated no evidence of spasms, dissections or of intraluminal filling defects. The Rosen exchange guidewire was removed. The tip of the 6 French Arrow sheath was positioned right at the mouth of the left vertebral artery. A 5 French 115 cm Sofia guide catheter was advanced over a 0.035 inch Roadrunner guidewire to the distal end of the neurovascular sheath. The guidewire was then gently advanced across the torturous proximal left vertebral artery and into the distal vertical segment followed by the [facility] guide catheter. The guidewire was removed. Good aspiration obtained from the hub of the Amelia guide catheter. A gentle contrast injection demonstrated no evidence of spasms, dissections or of intraluminal filling defects. No change was noted in the intracranial circulation. Measurements were performed of the basilar artery distal and proximal to the severe high-grade stenosis. A 3 mm x 15 mm Gateway balloon angioplasty microcatheter was chosen to perform the balloon angioplasty. Over a 0.014 inch Softip Synchro micro guidewire which had a J-tip configuration to avoid dissections or inducing spasm, the Gateway balloon  catheter and the micro guidewire were advanced to the distal tip of the [facility] guide catheter. Using biplane roadmap technique and constant fluoroscopic guidance, in a coaxial manner and with constant heparinized saline infusion, the combination was navigated using a torque device to the left vertebrobasilar junction proximal to the proximal stenosis. This was then followed by the advancement of the [facility] guide catheter proximal to this. Using a torque device, the micro guidewire was then gently advanced without difficulty across the proximal and the more severe distal stenosis and  into the distal basilar artery followed by the balloon angioplasty catheter. The distal and the proximal markers were then positioned optimally. A control inflation of the balloon was then undertaken using a micro inflation syringe device via micro tubing. Slow increments of balloon inflation were performed at 0.5 atmospheres. The balloon was inflated to 4 atmospheres achieving a diameter of approximately 2.9 mm. The balloon was maintained at the site for approximately 1 minute. The balloon was then deflated and retrieved proximally. A control arteriogram performed through the 5 [place] guide catheter in the left vertebrobasilar junction demonstrates significantly improved caliber and flow through the angioplastied segment proximally and distally. A repeat angiogram was performed at approximately 10 minutes. This demonstrated recoil of the angioplasty site. This prompted a second angioplasty. After having confirmed optimal position of the proximal and distal marks, balloon was then inflated again using a micro inflation syringe device via micro tubing to approximately 2.95 mm where it was maintained for approximately 30 seconds. The balloon was then deflated and retrieved. The distal wire was maintained in a stable position. Control arteriogram performed now demonstrated significantly improved caliber and flow through the angioplastied segment. No evidence of distal occlusions or intraluminal filling defects were seen. Control arteriograms were performed at 15, 30 and 40 minutes post the second angioplasty. These continued to demonstrate excellent flow through the angioplastied segments. This time only mild recoil was noted with a residual patency of approximately 65%-70% on the latter projection, and about 50-60% on the AP projection. The patient was also given a total of 3.6 mg of intra-arterial Integrilin to ensure dissolution of any minute platelet aggregation at the site of the angioplasty or distally. A final  control arteriogram demonstrated maintained significantly improved caliber and flow through the angioplastied segment of the proximal basilar artery with an approximately 65%-70% patency on the lateral projection, and about 50-60% patency on the AP projection. Throughout the procedure the patient's neurological status and hemodynamic status remained stable. The patient's ACT was maintained in the region of approximately 200 seconds. No angiographic evidence of extravasation was seen. The [place] guide catheter, and the 6 [place] Arrow neurovascular sheath were retrieved into the abdominal aorta and exchanged over a J-tip guidewire for a 6 [place] Pinnacle sheath. This in turn was then successfully removed with the application of an external closure device. The groin appeared soft without evidence of a hematoma. The distal pulses remained 1+ in the dorsalis pedis and the posterior tibia regions bilaterally. The patient's general anesthesia was reversed and the patient was extubated without difficulty. Upon recovery the patient demonstrated no new neurological signs or symptoms. The patient did not complain of any headaches or nausea. No new visual symptoms were noted. The patient was then transferred to the neuro ICU to continue on low-dose IV heparin, maintained tight control of the patient's blood pressure and continue close neurologic observations. Towards the evening the patient had recovered fully from the general anesthesia. Neurologically she remained  stable and unchanged compared to pre treatment. The right groin appeared soft with the distal pulses being 1+ universally in both feet. The following morning the patient's IV heparin was stopped and the patient was switched to Brilinta 90 mg b.i.d. The patient then gradually mobilized in a chair and was able to walk independently. The patient was then transferred back to the admitting service. The patient will be followed in 2 weeks in the clinic. She was  advised to maintain adequate hydration by drinking water. She was also advised to refrain from stooping or lifting weights above 10 pounds for a couple of weeks. She was also advised to refrain from driving during this time. Questions were answered to her satisfaction. She was asked to call should she have any concerns or questions. The patient expressed understanding of the management plan. 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 PLAN: Will follow up in clinic in 2 weeks. Electronically Signed   By: Kathleen Dunn M.D.   On: 03/28/2017 10:08   Mr Mra Head/brain ZT Cm  Result Date: 03/15/2017 CLINICAL DATA:  76 year old female with with evidence of new cerebellar infarcts from earlier this month on head CT today performed for abnormal left upper extremity movements and near syncope. EXAM: MRI HEAD WITHOUT CONTRAST MRA HEAD WITHOUT CONTRAST TECHNIQUE: Multiplanar, multiecho pulse sequences of the brain and surrounding structures were obtained without intravenous contrast. Angiographic images of the head were obtained using MRA technique without contrast. COMPARISON:  Head CT 0805 hours today.  Head CT 02/27/2017. FINDINGS: MRI HEAD FINDINGS Brain: Patchy restricted diffusion in both cerebellar hemispheres (series 6, image 12). No brainstem involvement. No deep gray matter nuclei involvement, but there are also several scattered small foci of restricted diffusion in the left occipital lobe. Associated T2 and FLAIR hyperintensity. No associated hemorrhage or mass effect. No anterior circulation restricted diffusion. No midline shift, mass effect, evidence of mass lesion, ventriculomegaly, extra-axial collection or acute intracranial hemorrhage. Cervicomedullary junction and pituitary are within normal limits. Patchy bilateral  cerebral white matter T2 and FLAIR hyperintensity. No chronic cortical encephalomalacia or chronic cerebral blood products are identified. Signal in the deep gray matter nuclei and brainstem is normal for age. Vascular: Major intracranial vascular flow voids are preserved, the distal left vertebral artery appears dominant and is somewhat dolichoectatic. There is mild generalized intracranial artery tortuosity. Skull and upper cervical spine: Negative. Sinuses/Orbits: Normal orbits soft tissues. Visualized paranasal sinuses and mastoids are stable and well pneumatized. Other: Visible internal auditory structures appear normal. Negative scalp soft tissues. MRA HEAD FINDINGS Antegrade flow in the dominant distal left vertebral artery. No distal left vertebral artery stenosis. Absent flow in the non dominant appearing right vertebral artery V4 segment, although there is faint flow signal in the distal right vertebral artery at the upper cervical spine. The basilar artery is patent, but there is severe proximal basilar stenosis, corresponding to an area of prominent calcified plaque on the CT head today (series 755, image 10). This is proximal to a dominant appearing right AICA origin. The distal basilar is irregular without additional stenosis. SCA and left PCA origins are normal. There is a fetal type right PCA origin. The left posterior communicating artery is diminutive or absent. There is mild irregularity and attenuated distal flow in the left PCA. The distal right PCA appears normal. Antegrade flow in both ICA siphons.  Evidence of a tortuous retropharyngeal course of 1 or board carotid arteries in the neck. No ICA siphon stenosis. Normal ophthalmic artery origins. Patent carotid termini. Normal MCA and ACA origins. Dominant left A1 segment. Anterior communicating artery and visible ACA branches are within normal limits. Visible bilateral MCA branches are within normal limits. IMPRESSION: 1. 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 No associated hemorrhage or mass effect. 2. 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 3. 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 4. Attenuated flow in the distal left PCA. 5. Mild to moderate for age nonspecific cerebral white matter signal changes. 6. Negative anterior circulation. 7. Salient findings discussed by telephone with Dr. Jeanene Dunn on 03/15/2017 at 20:33 . Electronically Signed   By: Kathleen Dunn M.D.   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 EXAM: BILATERAL COMMON CAROTID ARTERY AND BILATERAL VERTEBRAL ARTERY ANGIOGRAMS COMPARISON:  CT angiogram of the head and neck of 03/17/2017, and MRI of the brain and MRA of the brain of 03/15/2017. MEDICATIONS: No antibiotic was administered within 1 hour of the procedure. ANESTHESIA/SEDATION: Versed 1 mg IV; Fentanyl 25 mcg IV Moderate Sedation Time:  25 minutes. The patient was continuously monitored during the procedure by the interventional radiology nurse under my direct supervision. CONTRAST:  Isovue 300 approximately 60 mL. FLUOROSCOPY TIME:  Fluoroscopy Time: 7 minutes 18 seconds (959 mGy). COMPLICATIONS: None immediate. TECHNIQUE: Informed written consent was obtained from the patient after a thorough discussion of the procedural risks, benefits and alternatives. All questions were addressed. Maximal Sterile Barrier Technique was utilized including caps, mask, sterile gowns, sterile gloves, sterile drape, hand hygiene and skin antiseptic. A timeout was performed prior to the initiation of the procedure. The right groin was prepped and draped in the usual sterile fashion. Thereafter using modified Seldinger technique, transfemoral access into  the right common femoral artery was obtained without difficulty. Over a 0.035 inch guidewire a 5 French Pinnacle sheath was inserted. Through this, and also over a 0.035 inch guidewire a 5 [place] JB 1 catheter was advanced to the aortic arch region and selectively positioned in the right subclavian artery, the right common carotid artery and left common carotid artery and left vertebral artery. There were no acute complications. The patient tolerated the procedure well. FINDINGS: The right subclavian arteriogram demonstrates a hypoplastic right vertebral artery origin to be normal. The vessel is seen to ascend to the cranial skull base where it supplies the right posterior-inferior cerebellar artery. Contrast is seen extending into the right vertebrobasilar junction with nonvisualization of the basilar artery. The right common carotid arteriogram demonstrates moderate tortuosity of the distal right common carotid artery. There is a 50% stenosis at the origin of the right external carotid artery. Its branches, otherwise, opacify normally. The right internal carotid artery at the bulb demonstrates a 50% stenosis as well secondary to a concentric atherosclerotic plaque. Distal to this there is a double U-shaped tortuosity involving the proximal and the middle one thirds of the right internal carotid artery. No kinking is noted. Distal to this the vessel is seen to opacify normally to the cranial skull base. The petrous, the cavernous and the supraclinoid segments demonstrate wide patency. A dominant right posterior communicating artery is seen opacifying the right posterior cerebral  artery distribution. The right middle cerebral artery is seen to opacify normally into the capillary and venous phases. The right anterior cerebral artery proximally demonstrates approximately 50% stenoses. However, opacification is noted distal to this to the right A1 A2 junction and subsequently the right anterior cerebral artery  distribution into the capillary and venous phases. The left common carotid arteriogram demonstrates the left external carotid artery to be mildly narrowed at its origin. Its branches are normally opacified. The left internal carotid artery at the bulb demonstrates wide patency proximally. There again is a double U-shaped configuration of the junction of the proximal 1/3 and middle 1/3 of the left internal carotid artery. No associated kinking is seen. Distal to this the left internal carotid artery is seen to opacify normally to the cranial skull base. The petrous, cavernous and supraclinoid segments are widely patent. The left middle cerebral artery and the left anterior cerebral artery opacify into the capillary and venous phases. Transient cross-filling via the anterior communicating artery of the right anterior cerebral artery A2 segment is noted. The left subclavian artery demonstrates approximately 30% stenosis proximal to the origin of a dominant left vertebral artery. There is mild tortuosity of the proximal left vertebral artery. The vessel is, otherwise, seen to opacify normally to the cranial skull base. There is a mild focal stenoses of about 30% of the left vertebrobasilar junction proximal to the left posterior-inferior cerebellar artery. Distal to this mild caliber irregularity is noted of the distal left vertebrobasilar junction extending into the proximal basilar artery. The proximal basilar artery demonstrates a severe focal high-grade stenosis of 95% plus secondary to a circumferential plaque. Distal to this the basilar artery assumes normal caliber. A left anterior-inferior cerebellar artery/posterior-inferior cerebellar artery complex is noted. Opacification is noted of the left posterior cerebral artery, superior cerebellar arteries, and the anterior-inferior cerebellar arteries into the capillary and venous phases. There is a focal 50% stenosis of the left posterior cerebral artery at the P2  P3 junction. PROCEDURE: As above. IMPRESSION: Approximately 95% plus severe stenosis of the proximal basilar artery, with a more proximal approximately 30-50% stenosis. 50% stenosis at the origin of the right external carotid artery. Approximately 50% stenosis of the right internal carotid artery secondary to a circumferential atherosclerotic plaque. Approximately 50% stenosis of the proximal right anterior cerebral artery. PLAN: Discussed with the patient's referring stroke neurologist. Electronically Signed   By: Kathleen Dunn M.D.   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 EXAM: BILATERAL COMMON CAROTID ARTERY AND BILATERAL VERTEBRAL ARTERY ANGIOGRAMS COMPARISON:  CT angiogram of the head and neck of 03/17/2017, and MRI of the brain and MRA of the brain of 03/15/2017. MEDICATIONS: No antibiotic was administered within 1 hour of the procedure. ANESTHESIA/SEDATION: Versed 1 mg IV; Fentanyl 25 mcg IV Moderate Sedation Time:  25 minutes. The patient was continuously monitored during the procedure by the interventional radiology nurse under my direct supervision. CONTRAST:  Isovue 300 approximately 60 mL. FLUOROSCOPY TIME:  Fluoroscopy Time: 7 minutes 18 seconds (959 mGy). COMPLICATIONS: None immediate. TECHNIQUE: Informed written consent was obtained from the patient after a thorough discussion of the procedural risks, benefits and alternatives. All questions were addressed. Maximal Sterile Barrier Technique was utilized including caps, mask, sterile gowns, sterile gloves, sterile drape, hand hygiene and skin antiseptic. A timeout was performed prior to the initiation of the procedure. The right groin was  prepped and draped in the usual sterile fashion. Thereafter using modified Seldinger technique, transfemoral access into the right common femoral artery was obtained  without difficulty. Over a 0.035 inch guidewire a 5 French Pinnacle sheath was inserted. Through this, and also over a 0.035 inch guidewire a 5 [place] JB 1 catheter was advanced to the aortic arch region and selectively positioned in the right subclavian artery, the right common carotid artery and left common carotid artery and left vertebral artery. There were no acute complications. The patient tolerated the procedure well. FINDINGS: The right subclavian arteriogram demonstrates a hypoplastic right vertebral artery origin to be normal. The vessel is seen to ascend to the cranial skull base where it supplies the right posterior-inferior cerebellar artery. Contrast is seen extending into the right vertebrobasilar junction with nonvisualization of the basilar artery. The right common carotid arteriogram demonstrates moderate tortuosity of the distal right common carotid artery. There is a 50% stenosis at the origin of the right external carotid artery. Its branches, otherwise, opacify normally. The right internal carotid artery at the bulb demonstrates a 50% stenosis as well secondary to a concentric atherosclerotic plaque. Distal to this there is a double U-shaped tortuosity involving the proximal and the middle one thirds of the right internal carotid artery. No kinking is noted. Distal to this the vessel is seen to opacify normally to the cranial skull base. The petrous, the cavernous and the supraclinoid segments demonstrate wide patency. A dominant right posterior communicating artery is seen opacifying the right posterior cerebral artery distribution. The right middle cerebral artery is seen to opacify normally into the capillary and venous phases. The right anterior cerebral artery proximally demonstrates approximately 50% stenoses. However, opacification is noted distal to this to the right A1 A2 junction and subsequently the right anterior cerebral artery distribution into the capillary and venous phases.  The left common carotid arteriogram demonstrates the left external carotid artery to be mildly narrowed at its origin. Its branches are normally opacified. The left internal carotid artery at the bulb demonstrates wide patency proximally. There again is a double U-shaped configuration of the junction of the proximal 1/3 and middle 1/3 of the left internal carotid artery. No associated kinking is seen. Distal to this the left internal carotid artery is seen to opacify normally to the cranial skull base. The petrous, cavernous and supraclinoid segments are widely patent. The left middle cerebral artery and the left anterior cerebral artery opacify into the capillary and venous phases. Transient cross-filling via the anterior communicating artery of the right anterior cerebral artery A2 segment is noted. The left subclavian artery demonstrates approximately 30% stenosis proximal to the origin of a dominant left vertebral artery. There is mild tortuosity of the proximal left vertebral artery. The vessel is, otherwise, seen to opacify normally to the cranial skull base. There is a mild focal stenoses of about 30% of the left vertebrobasilar junction proximal to the left posterior-inferior cerebellar artery. Distal to this mild caliber irregularity is noted of the distal left vertebrobasilar junction extending into the proximal basilar artery. The proximal basilar artery demonstrates a severe focal high-grade stenosis of 95% plus secondary to a circumferential plaque. Distal to this the basilar artery assumes normal caliber. A left anterior-inferior cerebellar artery/posterior-inferior cerebellar artery complex is noted. Opacification is noted of the left posterior cerebral artery, superior cerebellar arteries, and the anterior-inferior cerebellar arteries into the capillary and venous phases. There is a focal 50% stenosis of the left posterior cerebral artery at the P2  P3 junction. PROCEDURE: As above. IMPRESSION:  Approximately 95% plus severe stenosis of the proximal basilar artery, with a more proximal approximately 30-50% stenosis. 50% stenosis at the origin of the right external carotid artery. Approximately 50% stenosis of the right internal carotid artery secondary to a circumferential atherosclerotic plaque. Approximately 50% stenosis of the proximal right anterior cerebral artery. PLAN: Discussed with the patient's referring stroke neurologist. Electronically Signed   By: Kathleen Dunn M.D.   On: 03/20/2017 12:25      Subjective: Patient reports headache that is controlled with Tylenol and tramadol.  Discharge Exam: Vitals:   04/01/17 0555 04/01/17 0902  BP: (!) 122/58 134/83  Pulse: 62 77  Resp: 16 18  Temp: 98.3 F (36.8 C) 97.6 F (36.4 C)   Vitals:   03/31/17 2118 03/31/17 2345 04/01/17 0555 04/01/17 0902  BP: (!) 115/52 139/75 (!) 122/58 134/83  Pulse: 74 78 62 77  Resp: 20 20 16 18   Temp: 98.2 F (36.8 C)  98.3 F (36.8 C) 97.6 F (36.4 C)  TempSrc: Oral  Oral Oral  SpO2: 95% 97% 98% 98%  Weight:      Height:        General: Pt is alert, awake, not in acute distress Cardiovascular: RRR, S1/S2 +, no rubs, no gallops Respiratory: CTA bilaterally, no wheezing, no rhonchi Abdominal: Soft, NT, ND, bowel sounds + Extremities: no edema, no cyanosis    The results of significant diagnostics from this hospitalization (including imaging, microbiology, ancillary and laboratory) are listed below for reference.     Microbiology: No results found for this or any previous visit (from the past 240 hour(s)).   Labs: BNP (last 3 results) No results for input(s): BNP in the last 8760 hours. Basic Metabolic Panel:  Recent Labs Lab 03/26/17 0605 03/27/17 0634  NA 138 137  K 3.7 3.8  CL 102 104  CO2 27 24  GLUCOSE 108* 109*  BUN 11 6  CREATININE 0.88 0.78  CALCIUM 8.9 8.7*   Liver Function Tests:  Recent Labs Lab 03/26/17 0605  AST 32  ALT 43  ALKPHOS 42   BILITOT 1.0  PROT 6.1*  ALBUMIN 3.5   No results for input(s): LIPASE, AMYLASE in the last 168 hours. No results for input(s): AMMONIA in the last 168 hours. CBC:  Recent Labs Lab 03/26/17 0605 03/27/17 0634  WBC 8.6 8.4  NEUTROABS 5.3 5.7  HGB 12.7 12.2  HCT 38.2 37.2  MCV 93.9 95.6  PLT 243 231   Cardiac Enzymes: No results for input(s): CKTOTAL, CKMB, CKMBINDEX, TROPONINI in the last 168 hours. BNP: Invalid input(s): POCBNP CBG:  Recent Labs Lab 03/26/17 0824 03/29/17 1127 03/31/17 1137  GLUCAP 89 119* 93   D-Dimer No results for input(s): DDIMER in the last 72 hours. Hgb A1c No results for input(s): HGBA1C in the last 72 hours. Lipid Profile  Recent Labs  03/31/17 0359  TRIG 218*   Thyroid function studies No results for input(s): TSH, T4TOTAL, T3FREE, THYROIDAB in the last 72 hours.  Invalid input(s): FREET3 Anemia work up No results for input(s): VITAMINB12, FOLATE, FERRITIN, TIBC, IRON, RETICCTPCT in the last 72 hours. Urinalysis    Component Value Date/Time   COLORURINE YELLOW 03/15/2017 0825   APPEARANCEUR CLEAR 03/15/2017 0825   LABSPEC 1.010 03/15/2017 0825   PHURINE 6.5 03/15/2017 0825   GLUCOSEU NEGATIVE 03/15/2017 0825   HGBUR NEGATIVE 03/15/2017 0825   BILIRUBINUR NEGATIVE 03/15/2017 0825   BILIRUBINUR neg 02/24/2013 1601   KETONESUR  NEGATIVE 03/15/2017 0825   PROTEINUR NEGATIVE 03/15/2017 0825   UROBILINOGEN 0.2 02/24/2013 1601   NITRITE NEGATIVE 03/15/2017 0825   LEUKOCYTESUR NEGATIVE 03/15/2017 0825   Sepsis Labs Invalid input(s): PROCALCITONIN,  WBC,  LACTICIDVEN Microbiology No results found for this or any previous visit (from the past 240 hour(s)).   Time coordinating discharge: Over 30 minutes  SIGNED:   Cordelia Poche, MD [facility] 04/01/2017, 11:37 AM Pager [phone]  If 7PM-7AM, please contact night-coverage [email or web address] Password [email or web address]

## 2017-04-01 NOTE — Consult Note (Addendum)
   [facility] CM Inpatient Consult   04/01/2017  Kathleen Dunn June 23, 1941 [id number]    [facility] Care Management referral received.   Confirmed discharge plan remains SNF. Patient was screened by [facility] representative. Inpatient LCSW and inpatient RNCM made aware.  No [facility] Care Management needs identified at this time.  Marthenia Rolling, MSN-Ed, RN,BSN [facility] Liaison [phone]

## 2017-04-01 NOTE — Progress Notes (Signed)
CSW met with patient to discuss discharge to [place]. Patient indicated that she had thought the plan for discharge was to go to a facility in [place], and that was what she had her mind set on, and she wanted to discuss with her daughter about coming to get her and transporting her. Patient's daughter contacted via phone in the room, and discussed discharge planning. Patient's daughter indicated that she thought that it would be best for the patient to stay in [place] since she wouldn't need rehab for very long, and it would be easier for the patient to get to her follow-up appointments here.   Patient became upset, started talking about how she didn't know how she was going to get her belongings out of her apartment if she went to a rehab facility, and that she had too many decisions that she had to make. Patient requested information on whether the doctor would readjust her discharge for tomorrow to allow her more time to think about her discharge plan. CSW indicated that the discharge order was for today and that the patient was medically ready to start rehab and had a bed available, that was discussed yesterday and agreed upon. Patient refused to engage further in the conversation, and requested that CSW come back later to discuss what she had decided.  CSW will follow up with patient later this afternoon to facilitate discharge to SNF.  Laveda Abbe, [place] Clinical Social Worker [phone]

## 2017-04-01 NOTE — Progress Notes (Signed)
Patient is discharged from room 5C06 at this time. Alert and in stable condition. IV site d/c'd and instructions given to patient with understanding verbalized. Three attempts made to receiving facility, [facility] without success. Message left with receptionist Spero Curb for receiving nurse to call for report when available. Transported out of facility via stretcher by [facility] with all belongings at side.

## 2017-04-01 NOTE — Progress Notes (Signed)
CSW met with patient to discuss discharge planning. Patient was more agreeable to SNF placement discussion, and indicated that she wanted to be closer to where she was living so she was familiar with her surroundings. CSW informed patient that [facility] was the closest facility. Patient indicated that she would be agreeable to go there.  CSW will follow up to facilitate discharge tomorrow to [facility].  Laveda Abbe, [place] Clinical Social Worker [phone]

## 2017-04-01 NOTE — Care Management Note (Signed)
Case Management Note  Patient Details  Name: Marlyn Tondreau MRN: [id number] Date of Birth: [birth year]  Subjective/Objective:         CM following for progression and d/c planning.            Action/Plan: 04/01/2017 Ongoing issues with this pt inability to select a SNF for rehab. This CM discussed with pt option to appeal her d/c as she now has a d/c order. Pt stating now that she is willing to d/c to [facility].  I have clearly explained to this pt that if she is refusing to d/c she must appeal her d/c and I have provided her a new copy of the IM for her instruction and provided the number for her to call to make an appeal.  Pt states that she plans to d/c to [facility], pt RN was asked to prepare the pt for d/c and CSW Rexene Alberts has called the ambulance for transport.   Expected Discharge Date:  04/01/17               Expected Discharge Plan:  [place]  In-House Referral:     Discharge planning Services  CM Consult  Post Acute Care Choice:    Choice offered to:     DME Arranged:    DME Agency:     HH Arranged:    [place] Agency:     Status of Service:  Completed, signed off  If discussed at [employer], dates discussed:    Additional Comments:  Adron Bene, RN 04/01/2017, 5:34 PM

## 2017-04-02 DIAGNOSIS — M25569 Pain in unspecified knee: Secondary | ICD-10-CM | POA: Diagnosis not present

## 2017-04-02 DIAGNOSIS — K5900 Constipation, unspecified: Secondary | ICD-10-CM | POA: Diagnosis not present

## 2017-04-03 ENCOUNTER — Telehealth (HOSPITAL_COMMUNITY): Payer: Self-pay

## 2017-04-03 DIAGNOSIS — I639 Cerebral infarction, unspecified: Secondary | ICD-10-CM | POA: Diagnosis not present

## 2017-04-03 DIAGNOSIS — F419 Anxiety disorder, unspecified: Secondary | ICD-10-CM | POA: Diagnosis not present

## 2017-04-03 DIAGNOSIS — R5381 Other malaise: Secondary | ICD-10-CM | POA: Diagnosis not present

## 2017-04-03 NOTE — Telephone Encounter (Signed)
Called to schedule 2 wk f/u, left message for pt to return call. AW 

## 2017-04-07 DIAGNOSIS — G4700 Insomnia, unspecified: Secondary | ICD-10-CM | POA: Diagnosis not present

## 2017-04-14 ENCOUNTER — Encounter (HOSPITAL_COMMUNITY): Payer: Self-pay | Admitting: Interventional Radiology

## 2017-04-14 ENCOUNTER — Ambulatory Visit (HOSPITAL_COMMUNITY)
Admission: RE | Admit: 2017-04-14 | Discharge: 2017-04-14 | Disposition: A | Discharge status: Home / Self Care | Payer: Medicare Other | Source: Ambulatory Visit | Attending: Radiology | Admitting: Radiology

## 2017-04-14 DIAGNOSIS — I6322 Cerebral infarction due to unspecified occlusion or stenosis of basilar arteries: Secondary | ICD-10-CM

## 2017-04-14 DIAGNOSIS — G4700 Insomnia, unspecified: Secondary | ICD-10-CM | POA: Diagnosis not present

## 2017-04-14 DIAGNOSIS — I651 Occlusion and stenosis of basilar artery: Secondary | ICD-10-CM | POA: Diagnosis not present

## 2017-04-14 HISTORY — PX: IR RADIOLOGIST EVAL & MGMT: IMG5224

## 2017-04-15 ENCOUNTER — Encounter (HOSPITAL_COMMUNITY): Payer: Self-pay | Admitting: Interventional Radiology

## 2017-04-18 DIAGNOSIS — I10 Essential (primary) hypertension: Secondary | ICD-10-CM | POA: Diagnosis not present

## 2017-04-18 DIAGNOSIS — F419 Anxiety disorder, unspecified: Secondary | ICD-10-CM | POA: Diagnosis not present

## 2017-04-22 ENCOUNTER — Telehealth (HOSPITAL_COMMUNITY): Payer: Self-pay

## 2017-04-22 NOTE — Telephone Encounter (Signed)
Returned pt's phone call. AW

## 2017-04-23 DIAGNOSIS — I6523 Occlusion and stenosis of bilateral carotid arteries: Secondary | ICD-10-CM | POA: Diagnosis not present

## 2017-04-23 DIAGNOSIS — I69352 Hemiplegia and hemiparesis following cerebral infarction affecting left dominant side: Secondary | ICD-10-CM | POA: Diagnosis not present

## 2017-04-23 DIAGNOSIS — I10 Essential (primary) hypertension: Secondary | ICD-10-CM | POA: Diagnosis not present

## 2017-04-23 DIAGNOSIS — I651 Occlusion and stenosis of basilar artery: Secondary | ICD-10-CM | POA: Diagnosis not present

## 2017-04-23 DIAGNOSIS — F411 Generalized anxiety disorder: Secondary | ICD-10-CM | POA: Diagnosis not present

## 2017-04-23 DIAGNOSIS — F329 Major depressive disorder, single episode, unspecified: Secondary | ICD-10-CM | POA: Diagnosis not present

## 2017-04-23 DIAGNOSIS — M1711 Unilateral primary osteoarthritis, right knee: Secondary | ICD-10-CM | POA: Diagnosis not present

## 2017-04-23 DIAGNOSIS — I69318 Other symptoms and signs involving cognitive functions following cerebral infarction: Secondary | ICD-10-CM | POA: Diagnosis not present

## 2017-04-23 DIAGNOSIS — J45909 Unspecified asthma, uncomplicated: Secondary | ICD-10-CM | POA: Diagnosis not present

## 2017-04-25 DIAGNOSIS — I10 Essential (primary) hypertension: Secondary | ICD-10-CM | POA: Diagnosis not present

## 2017-04-25 DIAGNOSIS — I69352 Hemiplegia and hemiparesis following cerebral infarction affecting left dominant side: Secondary | ICD-10-CM | POA: Diagnosis not present

## 2017-04-25 DIAGNOSIS — F411 Generalized anxiety disorder: Secondary | ICD-10-CM | POA: Diagnosis not present

## 2017-04-25 DIAGNOSIS — I6523 Occlusion and stenosis of bilateral carotid arteries: Secondary | ICD-10-CM | POA: Diagnosis not present

## 2017-04-25 DIAGNOSIS — M1711 Unilateral primary osteoarthritis, right knee: Secondary | ICD-10-CM | POA: Diagnosis not present

## 2017-04-25 DIAGNOSIS — I69318 Other symptoms and signs involving cognitive functions following cerebral infarction: Secondary | ICD-10-CM | POA: Diagnosis not present

## 2017-04-28 DIAGNOSIS — I10 Essential (primary) hypertension: Secondary | ICD-10-CM | POA: Diagnosis not present

## 2017-04-28 DIAGNOSIS — M1711 Unilateral primary osteoarthritis, right knee: Secondary | ICD-10-CM | POA: Diagnosis not present

## 2017-04-28 DIAGNOSIS — F411 Generalized anxiety disorder: Secondary | ICD-10-CM | POA: Diagnosis not present

## 2017-04-28 DIAGNOSIS — I69318 Other symptoms and signs involving cognitive functions following cerebral infarction: Secondary | ICD-10-CM | POA: Diagnosis not present

## 2017-04-28 DIAGNOSIS — I69352 Hemiplegia and hemiparesis following cerebral infarction affecting left dominant side: Secondary | ICD-10-CM | POA: Diagnosis not present

## 2017-04-28 DIAGNOSIS — I6523 Occlusion and stenosis of bilateral carotid arteries: Secondary | ICD-10-CM | POA: Diagnosis not present

## 2017-04-29 DIAGNOSIS — I69318 Other symptoms and signs involving cognitive functions following cerebral infarction: Secondary | ICD-10-CM | POA: Diagnosis not present

## 2017-04-29 DIAGNOSIS — F411 Generalized anxiety disorder: Secondary | ICD-10-CM | POA: Diagnosis not present

## 2017-04-29 DIAGNOSIS — M1711 Unilateral primary osteoarthritis, right knee: Secondary | ICD-10-CM | POA: Diagnosis not present

## 2017-04-29 DIAGNOSIS — I6523 Occlusion and stenosis of bilateral carotid arteries: Secondary | ICD-10-CM | POA: Diagnosis not present

## 2017-04-29 DIAGNOSIS — I69352 Hemiplegia and hemiparesis following cerebral infarction affecting left dominant side: Secondary | ICD-10-CM | POA: Diagnosis not present

## 2017-04-29 DIAGNOSIS — I10 Essential (primary) hypertension: Secondary | ICD-10-CM | POA: Diagnosis not present

## 2017-04-30 DIAGNOSIS — I69352 Hemiplegia and hemiparesis following cerebral infarction affecting left dominant side: Secondary | ICD-10-CM | POA: Diagnosis not present

## 2017-04-30 DIAGNOSIS — I10 Essential (primary) hypertension: Secondary | ICD-10-CM | POA: Diagnosis not present

## 2017-04-30 DIAGNOSIS — I6523 Occlusion and stenosis of bilateral carotid arteries: Secondary | ICD-10-CM | POA: Diagnosis not present

## 2017-04-30 DIAGNOSIS — F411 Generalized anxiety disorder: Secondary | ICD-10-CM | POA: Diagnosis not present

## 2017-04-30 DIAGNOSIS — I69318 Other symptoms and signs involving cognitive functions following cerebral infarction: Secondary | ICD-10-CM | POA: Diagnosis not present

## 2017-04-30 DIAGNOSIS — M1711 Unilateral primary osteoarthritis, right knee: Secondary | ICD-10-CM | POA: Diagnosis not present

## 2017-05-01 DIAGNOSIS — F411 Generalized anxiety disorder: Secondary | ICD-10-CM | POA: Diagnosis not present

## 2017-05-01 DIAGNOSIS — I10 Essential (primary) hypertension: Secondary | ICD-10-CM | POA: Diagnosis not present

## 2017-05-01 DIAGNOSIS — I69352 Hemiplegia and hemiparesis following cerebral infarction affecting left dominant side: Secondary | ICD-10-CM | POA: Diagnosis not present

## 2017-05-01 DIAGNOSIS — I6523 Occlusion and stenosis of bilateral carotid arteries: Secondary | ICD-10-CM | POA: Diagnosis not present

## 2017-05-01 DIAGNOSIS — I69318 Other symptoms and signs involving cognitive functions following cerebral infarction: Secondary | ICD-10-CM | POA: Diagnosis not present

## 2017-05-01 DIAGNOSIS — M1711 Unilateral primary osteoarthritis, right knee: Secondary | ICD-10-CM | POA: Diagnosis not present

## 2017-05-05 ENCOUNTER — Telehealth: Payer: Self-pay

## 2017-05-05 DIAGNOSIS — I69352 Hemiplegia and hemiparesis following cerebral infarction affecting left dominant side: Secondary | ICD-10-CM | POA: Diagnosis not present

## 2017-05-05 DIAGNOSIS — M1711 Unilateral primary osteoarthritis, right knee: Secondary | ICD-10-CM | POA: Diagnosis not present

## 2017-05-05 DIAGNOSIS — I6523 Occlusion and stenosis of bilateral carotid arteries: Secondary | ICD-10-CM | POA: Diagnosis not present

## 2017-05-05 DIAGNOSIS — I69318 Other symptoms and signs involving cognitive functions following cerebral infarction: Secondary | ICD-10-CM | POA: Diagnosis not present

## 2017-05-05 DIAGNOSIS — R911 Solitary pulmonary nodule: Secondary | ICD-10-CM

## 2017-05-05 DIAGNOSIS — I10 Essential (primary) hypertension: Secondary | ICD-10-CM | POA: Diagnosis not present

## 2017-05-05 DIAGNOSIS — F411 Generalized anxiety disorder: Secondary | ICD-10-CM | POA: Diagnosis not present

## 2017-05-05 DIAGNOSIS — R918 Other nonspecific abnormal finding of lung field: Secondary | ICD-10-CM

## 2017-05-05 NOTE — Telephone Encounter (Signed)
Patient needs follow up CT chest wo contrast. If ok, please sign or route back.

## 2017-05-05 NOTE — Progress Notes (Signed)
Subjective:    Patient ID: Kathleen Dunn, female    DOB: 1941/01/21, 76 y.o.   MRN: [id number]  HPI 76 year old female is here today for recent hospital follow-up. Please see hospital discharge note below.  "Kathleen Saundersis a 76 y.o.femalewith a history of asthma, hypertension, thyroid disease, anxiety, on Desogen therapy for hormone replacement. Patient was seen at [facility] for near syncope on June 14 and received a head CT which showed no acute intracranial abnormality but evidence of an old small left cerebellar infarct with small vessel ischemic changes in the white matter. Over the past 2 days the patient has had intermittent shaking and "twisting" of her left hand which last for several minutes. She has been feeling lightheaded over the past few days and took Dramamine twice, which preceded the episodes of shaking and twisting. She denies vertigo, chest pain, weakness, sensory changes. Her CT scan today showed worsening areas of low density in the cerebellum consistent with small acute/subacute cerebellar infarctions.    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 CVA seen on MRI. Angiogram significant for proximal basilar artery stenosis and 95%. S/p basilar artery angioplasty; residual patency of 65-70%. Plavix discontinued and Brilinta added in addition to continuing aspirin. Outpatient follow-up with neurology and vascular surgery.  Essential hypertension Continued metoprolol.  Hypothyroidism Continued Synthroid.  Anxiety Continued Klonopin when necessary.  Right knee pain Secondary to arthritis. Continued Voltaren gel.  Headache Chronic. Continued Tylenol and Tramadol  Post-nasal drip Continued Flonase.  Constipation Improved with enema. Continue Senna.  Discharge Diagnoses:  Principal Problem:   Cerebellar stroke ([place]) Active Problems:   Hypothyroidism   HYPERCHOLESTEROLEMIA   HYPERTENSION, BENIGN  SYSTEMIC   GAD (generalized anxiety disorder)   Mixed hyperlipidemia   Basilar artery stenosis   Bilateral carotid artery stenosis   Cerebrovascular accident (CVA) due to thrombosis of precerebral artery (HCC)   Basilar artery stenosis/occlusion with infarction ([place])"  Since discharge she has just felt extremely weak.  She will be getting PT and OT starting this week. She was released from [facility] on 8/7.    She feels stressed and "shakey" on the inside.  Just feels a little overwhelmed.    A representative from the home health company that is visiting her came in with her today.  Vitamin D deficiency-she said she was given a higher dose while she was in the skilled nursing facility but doesn't know what it was. We did receive a discharge note from their office but did not actually list her medications. For now just continue with the 5000 international units daily which she is taking over-the-counter.   Review of Systems     Objective:   Physical Exam  Constitutional: She is oriented to person, place, and time. She appears well-developed and well-nourished.  HENT:  Head: Normocephalic and atraumatic.  Cardiovascular: Normal rate, regular rhythm and normal heart sounds.   Pulmonary/Chest: Effort normal and breath sounds normal.  Neurological: She is alert and oriented to person, place, and time.  Tremor in both hands bilaterally  Skin: Skin is warm and dry.  Psychiatric: She has a normal mood and affect. Her behavior is normal.        Assessment & Plan:  Acute cerebellar stroke- she is still just very weak. OT and PT will be starting this week.    Hypertension- Well controlled. Continue current regimen. Follow up in  6 months.  Will refill her medications.  Hypothyroidism- continue current regimen. REcheck level at  f/U in 2-3 months.    Aortic atherosclerosis-often also noted on CT angiogram of the neck.  Lung lesion-noted to have a calcified granuloma in the left upper  lobe of the lung base on CT of the neck.she is also noted to have bilateral pulmonary nodules in the lungs noted on chest CT of the chest in May 2016 and was 2 years ago. They recommended follow-up CT at one year if at high risk for bronchogenic carcinoma.  Vit D def - she is taking 5000 IU daily. REcheck level at last OV.   Insmonia - she uses her clonazepam at night to sleep.  Will change to once a day at bedtime only instead of BID.  Will work on weaning her.    Essential tremor - can discuss treatment option at next OV.    Time spent 45 min, greater than 50% time spent counseling about her stroke, blood pressure, thyroid, atherosclerosis, lung lesions, insomnia, tremor and vitamin D.

## 2017-05-06 ENCOUNTER — Ambulatory Visit (INDEPENDENT_AMBULATORY_CARE_PROVIDER_SITE_OTHER): Payer: Medicare Other | Admitting: Family Medicine

## 2017-05-06 ENCOUNTER — Ambulatory Visit (INDEPENDENT_AMBULATORY_CARE_PROVIDER_SITE_OTHER): Payer: Medicare Other

## 2017-05-06 ENCOUNTER — Encounter: Payer: Self-pay | Admitting: Family Medicine

## 2017-05-06 VITALS — BP 123/64 | HR 59 | Ht 60.0 in | Wt 175.0 lb

## 2017-05-06 DIAGNOSIS — I10 Essential (primary) hypertension: Secondary | ICD-10-CM | POA: Diagnosis not present

## 2017-05-06 DIAGNOSIS — I700 Atherosclerosis of aorta: Secondary | ICD-10-CM

## 2017-05-06 DIAGNOSIS — I6322 Cerebral infarction due to unspecified occlusion or stenosis of basilar arteries: Secondary | ICD-10-CM | POA: Diagnosis not present

## 2017-05-06 DIAGNOSIS — F5101 Primary insomnia: Secondary | ICD-10-CM | POA: Diagnosis not present

## 2017-05-06 DIAGNOSIS — I639 Cerebral infarction, unspecified: Secondary | ICD-10-CM | POA: Diagnosis not present

## 2017-05-06 DIAGNOSIS — G250 Essential tremor: Secondary | ICD-10-CM | POA: Diagnosis not present

## 2017-05-06 DIAGNOSIS — R911 Solitary pulmonary nodule: Secondary | ICD-10-CM

## 2017-05-06 DIAGNOSIS — E039 Hypothyroidism, unspecified: Secondary | ICD-10-CM

## 2017-05-06 DIAGNOSIS — R918 Other nonspecific abnormal finding of lung field: Secondary | ICD-10-CM | POA: Diagnosis not present

## 2017-05-06 DIAGNOSIS — Z8673 Personal history of transient ischemic attack (TIA), and cerebral infarction without residual deficits: Secondary | ICD-10-CM | POA: Diagnosis not present

## 2017-05-06 MED ORDER — BETAMETHASONE VALERATE 0.12 % EX FOAM: 1.0000 | Freq: Every day | CUTANEOUS | 4 refills | Status: DC | PRN

## 2017-05-06 MED ORDER — TRAMADOL HCL 50 MG PO TABS: 50.0000 mg | ORAL_TABLET | Freq: Two times a day (BID) | ORAL | 0 refills | Status: DC | PRN

## 2017-05-06 MED ORDER — CLONAZEPAM 1 MG PO TABS: 1.0000 mg | ORAL_TABLET | Freq: Every evening | ORAL | 0 refills | Status: AC | PRN

## 2017-05-06 NOTE — Telephone Encounter (Signed)
Order placed

## 2017-05-07 DIAGNOSIS — I69352 Hemiplegia and hemiparesis following cerebral infarction affecting left dominant side: Secondary | ICD-10-CM | POA: Diagnosis not present

## 2017-05-07 DIAGNOSIS — I6523 Occlusion and stenosis of bilateral carotid arteries: Secondary | ICD-10-CM | POA: Diagnosis not present

## 2017-05-07 DIAGNOSIS — I10 Essential (primary) hypertension: Secondary | ICD-10-CM | POA: Diagnosis not present

## 2017-05-07 DIAGNOSIS — M1711 Unilateral primary osteoarthritis, right knee: Secondary | ICD-10-CM | POA: Diagnosis not present

## 2017-05-07 DIAGNOSIS — I69318 Other symptoms and signs involving cognitive functions following cerebral infarction: Secondary | ICD-10-CM | POA: Diagnosis not present

## 2017-05-07 DIAGNOSIS — F411 Generalized anxiety disorder: Secondary | ICD-10-CM | POA: Diagnosis not present

## 2017-05-08 ENCOUNTER — Telehealth: Payer: Self-pay

## 2017-05-08 DIAGNOSIS — F411 Generalized anxiety disorder: Secondary | ICD-10-CM | POA: Diagnosis not present

## 2017-05-08 DIAGNOSIS — I6523 Occlusion and stenosis of bilateral carotid arteries: Secondary | ICD-10-CM | POA: Diagnosis not present

## 2017-05-08 DIAGNOSIS — I69318 Other symptoms and signs involving cognitive functions following cerebral infarction: Secondary | ICD-10-CM | POA: Diagnosis not present

## 2017-05-08 DIAGNOSIS — I69352 Hemiplegia and hemiparesis following cerebral infarction affecting left dominant side: Secondary | ICD-10-CM | POA: Diagnosis not present

## 2017-05-08 DIAGNOSIS — I10 Essential (primary) hypertension: Secondary | ICD-10-CM | POA: Diagnosis not present

## 2017-05-08 DIAGNOSIS — M1711 Unilateral primary osteoarthritis, right knee: Secondary | ICD-10-CM | POA: Diagnosis not present

## 2017-05-08 NOTE — Telephone Encounter (Signed)
Pt called and results of CT given.

## 2017-05-09 DIAGNOSIS — I69352 Hemiplegia and hemiparesis following cerebral infarction affecting left dominant side: Secondary | ICD-10-CM | POA: Diagnosis not present

## 2017-05-09 DIAGNOSIS — F411 Generalized anxiety disorder: Secondary | ICD-10-CM | POA: Diagnosis not present

## 2017-05-09 DIAGNOSIS — I6523 Occlusion and stenosis of bilateral carotid arteries: Secondary | ICD-10-CM | POA: Diagnosis not present

## 2017-05-09 DIAGNOSIS — I10 Essential (primary) hypertension: Secondary | ICD-10-CM | POA: Diagnosis not present

## 2017-05-09 DIAGNOSIS — I69318 Other symptoms and signs involving cognitive functions following cerebral infarction: Secondary | ICD-10-CM | POA: Diagnosis not present

## 2017-05-09 DIAGNOSIS — M1711 Unilateral primary osteoarthritis, right knee: Secondary | ICD-10-CM | POA: Diagnosis not present

## 2017-05-09 NOTE — Telephone Encounter (Signed)
Kathleen Dunn from [facility] called and asked for an extension of home health care once a week for 3 weeks.  Order given.

## 2017-05-09 NOTE — Telephone Encounter (Signed)
Agree with above.  Catherine Metheney, MD  

## 2017-05-12 DIAGNOSIS — I10 Essential (primary) hypertension: Secondary | ICD-10-CM | POA: Diagnosis not present

## 2017-05-12 DIAGNOSIS — I69352 Hemiplegia and hemiparesis following cerebral infarction affecting left dominant side: Secondary | ICD-10-CM | POA: Diagnosis not present

## 2017-05-12 DIAGNOSIS — F411 Generalized anxiety disorder: Secondary | ICD-10-CM | POA: Diagnosis not present

## 2017-05-12 DIAGNOSIS — I69318 Other symptoms and signs involving cognitive functions following cerebral infarction: Secondary | ICD-10-CM | POA: Diagnosis not present

## 2017-05-12 DIAGNOSIS — I6523 Occlusion and stenosis of bilateral carotid arteries: Secondary | ICD-10-CM | POA: Diagnosis not present

## 2017-05-12 DIAGNOSIS — M1711 Unilateral primary osteoarthritis, right knee: Secondary | ICD-10-CM | POA: Diagnosis not present

## 2017-05-13 DIAGNOSIS — I69318 Other symptoms and signs involving cognitive functions following cerebral infarction: Secondary | ICD-10-CM | POA: Diagnosis not present

## 2017-05-13 DIAGNOSIS — I69352 Hemiplegia and hemiparesis following cerebral infarction affecting left dominant side: Secondary | ICD-10-CM | POA: Diagnosis not present

## 2017-05-13 DIAGNOSIS — F411 Generalized anxiety disorder: Secondary | ICD-10-CM | POA: Diagnosis not present

## 2017-05-13 DIAGNOSIS — I10 Essential (primary) hypertension: Secondary | ICD-10-CM | POA: Diagnosis not present

## 2017-05-13 DIAGNOSIS — I6523 Occlusion and stenosis of bilateral carotid arteries: Secondary | ICD-10-CM | POA: Diagnosis not present

## 2017-05-13 DIAGNOSIS — M1711 Unilateral primary osteoarthritis, right knee: Secondary | ICD-10-CM | POA: Diagnosis not present

## 2017-05-14 DIAGNOSIS — I69352 Hemiplegia and hemiparesis following cerebral infarction affecting left dominant side: Secondary | ICD-10-CM | POA: Diagnosis not present

## 2017-05-14 DIAGNOSIS — I6523 Occlusion and stenosis of bilateral carotid arteries: Secondary | ICD-10-CM | POA: Diagnosis not present

## 2017-05-14 DIAGNOSIS — I69318 Other symptoms and signs involving cognitive functions following cerebral infarction: Secondary | ICD-10-CM | POA: Diagnosis not present

## 2017-05-14 DIAGNOSIS — I10 Essential (primary) hypertension: Secondary | ICD-10-CM | POA: Diagnosis not present

## 2017-05-14 DIAGNOSIS — F411 Generalized anxiety disorder: Secondary | ICD-10-CM | POA: Diagnosis not present

## 2017-05-14 DIAGNOSIS — M1711 Unilateral primary osteoarthritis, right knee: Secondary | ICD-10-CM | POA: Diagnosis not present

## 2017-05-15 DIAGNOSIS — I10 Essential (primary) hypertension: Secondary | ICD-10-CM | POA: Diagnosis not present

## 2017-05-15 DIAGNOSIS — I69318 Other symptoms and signs involving cognitive functions following cerebral infarction: Secondary | ICD-10-CM | POA: Diagnosis not present

## 2017-05-15 DIAGNOSIS — I6523 Occlusion and stenosis of bilateral carotid arteries: Secondary | ICD-10-CM | POA: Diagnosis not present

## 2017-05-15 DIAGNOSIS — F411 Generalized anxiety disorder: Secondary | ICD-10-CM | POA: Diagnosis not present

## 2017-05-15 DIAGNOSIS — I69352 Hemiplegia and hemiparesis following cerebral infarction affecting left dominant side: Secondary | ICD-10-CM | POA: Diagnosis not present

## 2017-05-15 DIAGNOSIS — M1711 Unilateral primary osteoarthritis, right knee: Secondary | ICD-10-CM | POA: Diagnosis not present

## 2017-05-20 ENCOUNTER — Ambulatory Visit: Payer: Self-pay | Admitting: Neurology

## 2017-05-21 ENCOUNTER — Telehealth: Payer: Self-pay | Admitting: Family Medicine

## 2017-05-21 DIAGNOSIS — F411 Generalized anxiety disorder: Secondary | ICD-10-CM | POA: Diagnosis not present

## 2017-05-21 DIAGNOSIS — I69318 Other symptoms and signs involving cognitive functions following cerebral infarction: Secondary | ICD-10-CM | POA: Diagnosis not present

## 2017-05-21 DIAGNOSIS — M1711 Unilateral primary osteoarthritis, right knee: Secondary | ICD-10-CM | POA: Diagnosis not present

## 2017-05-21 DIAGNOSIS — I69352 Hemiplegia and hemiparesis following cerebral infarction affecting left dominant side: Secondary | ICD-10-CM | POA: Diagnosis not present

## 2017-05-21 NOTE — Telephone Encounter (Signed)
Patient called requesting to get an appointment this week if possible need a hosp f/u pt phone was a little muffled  but she had a stroke and need to f/up with pcp. Transferred pt to triage adv pt Dr. Lacie Scotts next appt is Monday 05/26/17 and she said that may be too long. Thanks

## 2017-05-22 DIAGNOSIS — I6523 Occlusion and stenosis of bilateral carotid arteries: Secondary | ICD-10-CM | POA: Diagnosis not present

## 2017-05-22 DIAGNOSIS — I10 Essential (primary) hypertension: Secondary | ICD-10-CM | POA: Diagnosis not present

## 2017-05-22 DIAGNOSIS — I69352 Hemiplegia and hemiparesis following cerebral infarction affecting left dominant side: Secondary | ICD-10-CM | POA: Diagnosis not present

## 2017-05-22 DIAGNOSIS — M1711 Unilateral primary osteoarthritis, right knee: Secondary | ICD-10-CM | POA: Diagnosis not present

## 2017-05-22 DIAGNOSIS — F411 Generalized anxiety disorder: Secondary | ICD-10-CM | POA: Diagnosis not present

## 2017-05-22 DIAGNOSIS — I69318 Other symptoms and signs involving cognitive functions following cerebral infarction: Secondary | ICD-10-CM | POA: Diagnosis not present

## 2017-05-23 ENCOUNTER — Telehealth: Payer: Self-pay

## 2017-05-23 DIAGNOSIS — I69318 Other symptoms and signs involving cognitive functions following cerebral infarction: Secondary | ICD-10-CM | POA: Diagnosis not present

## 2017-05-23 DIAGNOSIS — M1711 Unilateral primary osteoarthritis, right knee: Secondary | ICD-10-CM | POA: Diagnosis not present

## 2017-05-23 DIAGNOSIS — I6523 Occlusion and stenosis of bilateral carotid arteries: Secondary | ICD-10-CM | POA: Diagnosis not present

## 2017-05-23 DIAGNOSIS — I10 Essential (primary) hypertension: Secondary | ICD-10-CM | POA: Diagnosis not present

## 2017-05-23 DIAGNOSIS — I69352 Hemiplegia and hemiparesis following cerebral infarction affecting left dominant side: Secondary | ICD-10-CM | POA: Diagnosis not present

## 2017-05-23 DIAGNOSIS — F411 Generalized anxiety disorder: Secondary | ICD-10-CM | POA: Diagnosis not present

## 2017-05-23 MED ORDER — TICAGRELOR 90 MG PO TABS: 90.0000 mg | ORAL_TABLET | Freq: Two times a day (BID) | ORAL | 1 refills | Status: DC

## 2017-05-23 MED ORDER — EZETIMIBE 10 MG PO TABS: 10.0000 mg | ORAL_TABLET | Freq: Every day | ORAL | 1 refills | Status: AC

## 2017-05-23 MED ORDER — ROSUVASTATIN CALCIUM 40 MG PO TABS: 40.0000 mg | ORAL_TABLET | Freq: Every day | ORAL | 1 refills | Status: AC

## 2017-05-23 MED ORDER — LOSARTAN POTASSIUM 50 MG PO TABS: 50.0000 mg | ORAL_TABLET | Freq: Every day | ORAL | 1 refills | Status: AC

## 2017-05-23 NOTE — Telephone Encounter (Signed)
Patient called back to f/up on med refill request and adv pt that her meds have been refilled. Santiago Glad you do not have to call patient back.

## 2017-05-23 NOTE — Telephone Encounter (Signed)
First Kathleen Dunn with [facility], would like an order for three more sessions for cognition safety, and independence at home.   Second, Kathleen Dunn called and needs refills on several medications.  They were ordered when she was in therapy.  Brilinta 90 mg, Rosuvastatin 40 mg, ezetimibe 10 mg, and losartan 50 mg.  Please advise as to if you will fill.

## 2017-05-23 NOTE — Telephone Encounter (Signed)
Medications sent .Kathleen Dunn [address]

## 2017-05-23 NOTE — Telephone Encounter (Signed)
Yes ok for order and [facility] for refill on medication.  90 days supply with 1 RF.

## 2017-05-26 DIAGNOSIS — I69318 Other symptoms and signs involving cognitive functions following cerebral infarction: Secondary | ICD-10-CM | POA: Diagnosis not present

## 2017-05-26 DIAGNOSIS — F411 Generalized anxiety disorder: Secondary | ICD-10-CM | POA: Diagnosis not present

## 2017-05-26 DIAGNOSIS — I6523 Occlusion and stenosis of bilateral carotid arteries: Secondary | ICD-10-CM | POA: Diagnosis not present

## 2017-05-26 DIAGNOSIS — I69352 Hemiplegia and hemiparesis following cerebral infarction affecting left dominant side: Secondary | ICD-10-CM | POA: Diagnosis not present

## 2017-05-26 DIAGNOSIS — I10 Essential (primary) hypertension: Secondary | ICD-10-CM | POA: Diagnosis not present

## 2017-05-26 DIAGNOSIS — M1711 Unilateral primary osteoarthritis, right knee: Secondary | ICD-10-CM | POA: Diagnosis not present

## 2017-05-28 ENCOUNTER — Ambulatory Visit: Payer: Self-pay | Admitting: Neurology

## 2017-05-28 ENCOUNTER — Telehealth: Payer: Self-pay

## 2017-05-28 DIAGNOSIS — I639 Cerebral infarction, unspecified: Secondary | ICD-10-CM

## 2017-05-28 NOTE — Telephone Encounter (Signed)
Niko called and is upset that [place] because PT department discharged patient.   She states [name], MD wanted patient to have 8 weeks of PT. Is it ok to send another referral to [facility] for PT? If yes, what is the diagnosis?

## 2017-05-29 DIAGNOSIS — I6523 Occlusion and stenosis of bilateral carotid arteries: Secondary | ICD-10-CM | POA: Diagnosis not present

## 2017-05-29 DIAGNOSIS — I10 Essential (primary) hypertension: Secondary | ICD-10-CM | POA: Diagnosis not present

## 2017-05-29 DIAGNOSIS — F411 Generalized anxiety disorder: Secondary | ICD-10-CM | POA: Diagnosis not present

## 2017-05-29 DIAGNOSIS — I69352 Hemiplegia and hemiparesis following cerebral infarction affecting left dominant side: Secondary | ICD-10-CM | POA: Diagnosis not present

## 2017-05-29 DIAGNOSIS — M1711 Unilateral primary osteoarthritis, right knee: Secondary | ICD-10-CM | POA: Diagnosis not present

## 2017-05-29 DIAGNOSIS — I69318 Other symptoms and signs involving cognitive functions following cerebral infarction: Secondary | ICD-10-CM | POA: Diagnosis not present

## 2017-05-29 NOTE — Telephone Encounter (Signed)
OK for referral. Dx of stroke.

## 2017-05-29 NOTE — Telephone Encounter (Signed)
Referral placed.

## 2017-05-29 NOTE — Telephone Encounter (Signed)
Patient advised.

## 2017-06-02 DIAGNOSIS — F411 Generalized anxiety disorder: Secondary | ICD-10-CM | POA: Diagnosis not present

## 2017-06-02 DIAGNOSIS — M1711 Unilateral primary osteoarthritis, right knee: Secondary | ICD-10-CM | POA: Diagnosis not present

## 2017-06-02 DIAGNOSIS — I6523 Occlusion and stenosis of bilateral carotid arteries: Secondary | ICD-10-CM | POA: Diagnosis not present

## 2017-06-02 DIAGNOSIS — I10 Essential (primary) hypertension: Secondary | ICD-10-CM | POA: Diagnosis not present

## 2017-06-02 DIAGNOSIS — I69318 Other symptoms and signs involving cognitive functions following cerebral infarction: Secondary | ICD-10-CM | POA: Diagnosis not present

## 2017-06-02 DIAGNOSIS — I69352 Hemiplegia and hemiparesis following cerebral infarction affecting left dominant side: Secondary | ICD-10-CM | POA: Diagnosis not present

## 2017-06-06 DIAGNOSIS — I69352 Hemiplegia and hemiparesis following cerebral infarction affecting left dominant side: Secondary | ICD-10-CM | POA: Diagnosis not present

## 2017-06-06 DIAGNOSIS — I69318 Other symptoms and signs involving cognitive functions following cerebral infarction: Secondary | ICD-10-CM | POA: Diagnosis not present

## 2017-06-06 DIAGNOSIS — M1711 Unilateral primary osteoarthritis, right knee: Secondary | ICD-10-CM | POA: Diagnosis not present

## 2017-06-06 DIAGNOSIS — F411 Generalized anxiety disorder: Secondary | ICD-10-CM | POA: Diagnosis not present

## 2017-06-09 DIAGNOSIS — Z9181 History of falling: Secondary | ICD-10-CM | POA: Diagnosis not present

## 2017-06-09 DIAGNOSIS — F411 Generalized anxiety disorder: Secondary | ICD-10-CM | POA: Diagnosis not present

## 2017-06-09 DIAGNOSIS — Z87891 Personal history of nicotine dependence: Secondary | ICD-10-CM | POA: Diagnosis not present

## 2017-06-09 DIAGNOSIS — F329 Major depressive disorder, single episode, unspecified: Secondary | ICD-10-CM | POA: Diagnosis not present

## 2017-06-09 DIAGNOSIS — R911 Solitary pulmonary nodule: Secondary | ICD-10-CM | POA: Diagnosis not present

## 2017-06-09 DIAGNOSIS — G4700 Insomnia, unspecified: Secondary | ICD-10-CM | POA: Diagnosis not present

## 2017-06-09 DIAGNOSIS — G250 Essential tremor: Secondary | ICD-10-CM | POA: Diagnosis not present

## 2017-06-09 DIAGNOSIS — J45909 Unspecified asthma, uncomplicated: Secondary | ICD-10-CM | POA: Diagnosis not present

## 2017-06-09 DIAGNOSIS — E039 Hypothyroidism, unspecified: Secondary | ICD-10-CM | POA: Diagnosis not present

## 2017-06-09 DIAGNOSIS — I69398 Other sequelae of cerebral infarction: Secondary | ICD-10-CM | POA: Diagnosis not present

## 2017-06-09 DIAGNOSIS — M6281 Muscle weakness (generalized): Secondary | ICD-10-CM | POA: Diagnosis not present

## 2017-06-09 DIAGNOSIS — I6523 Occlusion and stenosis of bilateral carotid arteries: Secondary | ICD-10-CM | POA: Diagnosis not present

## 2017-06-09 DIAGNOSIS — Z7982 Long term (current) use of aspirin: Secondary | ICD-10-CM | POA: Diagnosis not present

## 2017-06-09 DIAGNOSIS — E7800 Pure hypercholesterolemia, unspecified: Secondary | ICD-10-CM | POA: Diagnosis not present

## 2017-06-09 DIAGNOSIS — D510 Vitamin B12 deficiency anemia due to intrinsic factor deficiency: Secondary | ICD-10-CM | POA: Diagnosis not present

## 2017-06-09 DIAGNOSIS — I2510 Atherosclerotic heart disease of native coronary artery without angina pectoris: Secondary | ICD-10-CM | POA: Diagnosis not present

## 2017-06-09 DIAGNOSIS — E559 Vitamin D deficiency, unspecified: Secondary | ICD-10-CM | POA: Diagnosis not present

## 2017-06-09 DIAGNOSIS — I10 Essential (primary) hypertension: Secondary | ICD-10-CM | POA: Diagnosis not present

## 2017-06-09 DIAGNOSIS — M1711 Unilateral primary osteoarthritis, right knee: Secondary | ICD-10-CM | POA: Diagnosis not present

## 2017-06-12 DIAGNOSIS — M6281 Muscle weakness (generalized): Secondary | ICD-10-CM | POA: Diagnosis not present

## 2017-06-12 DIAGNOSIS — F411 Generalized anxiety disorder: Secondary | ICD-10-CM | POA: Diagnosis not present

## 2017-06-12 DIAGNOSIS — F329 Major depressive disorder, single episode, unspecified: Secondary | ICD-10-CM | POA: Diagnosis not present

## 2017-06-12 DIAGNOSIS — I69398 Other sequelae of cerebral infarction: Secondary | ICD-10-CM | POA: Diagnosis not present

## 2017-06-12 DIAGNOSIS — G250 Essential tremor: Secondary | ICD-10-CM | POA: Diagnosis not present

## 2017-06-12 DIAGNOSIS — I10 Essential (primary) hypertension: Secondary | ICD-10-CM | POA: Diagnosis not present

## 2017-06-18 DIAGNOSIS — M6281 Muscle weakness (generalized): Secondary | ICD-10-CM | POA: Diagnosis not present

## 2017-06-18 DIAGNOSIS — I10 Essential (primary) hypertension: Secondary | ICD-10-CM | POA: Diagnosis not present

## 2017-06-18 DIAGNOSIS — F411 Generalized anxiety disorder: Secondary | ICD-10-CM | POA: Diagnosis not present

## 2017-06-18 DIAGNOSIS — G250 Essential tremor: Secondary | ICD-10-CM | POA: Diagnosis not present

## 2017-06-18 DIAGNOSIS — F329 Major depressive disorder, single episode, unspecified: Secondary | ICD-10-CM | POA: Diagnosis not present

## 2017-06-18 DIAGNOSIS — I69398 Other sequelae of cerebral infarction: Secondary | ICD-10-CM | POA: Diagnosis not present

## 2017-06-19 ENCOUNTER — Telehealth: Payer: Self-pay

## 2017-06-19 DIAGNOSIS — M6281 Muscle weakness (generalized): Secondary | ICD-10-CM | POA: Diagnosis not present

## 2017-06-19 DIAGNOSIS — G250 Essential tremor: Secondary | ICD-10-CM | POA: Diagnosis not present

## 2017-06-19 DIAGNOSIS — I10 Essential (primary) hypertension: Secondary | ICD-10-CM | POA: Diagnosis not present

## 2017-06-19 DIAGNOSIS — F411 Generalized anxiety disorder: Secondary | ICD-10-CM | POA: Diagnosis not present

## 2017-06-19 DIAGNOSIS — I69398 Other sequelae of cerebral infarction: Secondary | ICD-10-CM | POA: Diagnosis not present

## 2017-06-19 DIAGNOSIS — F329 Major depressive disorder, single episode, unspecified: Secondary | ICD-10-CM | POA: Diagnosis not present

## 2017-06-19 NOTE — Telephone Encounter (Signed)
Thank you :)

## 2017-06-19 NOTE — Telephone Encounter (Signed)
[facility] called. I gave a verbal order for OT.

## 2017-06-23 DIAGNOSIS — G250 Essential tremor: Secondary | ICD-10-CM | POA: Diagnosis not present

## 2017-06-23 DIAGNOSIS — F329 Major depressive disorder, single episode, unspecified: Secondary | ICD-10-CM | POA: Diagnosis not present

## 2017-06-23 DIAGNOSIS — I10 Essential (primary) hypertension: Secondary | ICD-10-CM | POA: Diagnosis not present

## 2017-06-23 DIAGNOSIS — F411 Generalized anxiety disorder: Secondary | ICD-10-CM | POA: Diagnosis not present

## 2017-06-23 DIAGNOSIS — M6281 Muscle weakness (generalized): Secondary | ICD-10-CM | POA: Diagnosis not present

## 2017-06-23 DIAGNOSIS — I69398 Other sequelae of cerebral infarction: Secondary | ICD-10-CM | POA: Diagnosis not present

## 2017-06-24 DIAGNOSIS — F329 Major depressive disorder, single episode, unspecified: Secondary | ICD-10-CM | POA: Diagnosis not present

## 2017-06-24 DIAGNOSIS — G250 Essential tremor: Secondary | ICD-10-CM | POA: Diagnosis not present

## 2017-06-24 DIAGNOSIS — M6281 Muscle weakness (generalized): Secondary | ICD-10-CM | POA: Diagnosis not present

## 2017-06-24 DIAGNOSIS — I69398 Other sequelae of cerebral infarction: Secondary | ICD-10-CM | POA: Diagnosis not present

## 2017-06-24 DIAGNOSIS — I10 Essential (primary) hypertension: Secondary | ICD-10-CM | POA: Diagnosis not present

## 2017-06-24 DIAGNOSIS — F411 Generalized anxiety disorder: Secondary | ICD-10-CM | POA: Diagnosis not present

## 2017-06-25 ENCOUNTER — Other Ambulatory Visit: Payer: Self-pay

## 2017-06-25 NOTE — Telephone Encounter (Signed)
Pt is requesting a stool softener.  Please advise.

## 2017-06-26 ENCOUNTER — Encounter: Payer: Self-pay | Admitting: Neurology

## 2017-06-26 ENCOUNTER — Ambulatory Visit (INDEPENDENT_AMBULATORY_CARE_PROVIDER_SITE_OTHER): Payer: Medicare Other | Admitting: Neurology

## 2017-06-26 VITALS — BP 147/60 | HR 75 | Ht 61.0 in | Wt 172.0 lb

## 2017-06-26 DIAGNOSIS — I6389 Other cerebral infarction: Secondary | ICD-10-CM

## 2017-06-26 DIAGNOSIS — I639 Cerebral infarction, unspecified: Secondary | ICD-10-CM

## 2017-06-26 NOTE — Telephone Encounter (Signed)
Where to send it?

## 2017-06-27 DIAGNOSIS — F329 Major depressive disorder, single episode, unspecified: Secondary | ICD-10-CM | POA: Diagnosis not present

## 2017-06-27 DIAGNOSIS — F411 Generalized anxiety disorder: Secondary | ICD-10-CM | POA: Diagnosis not present

## 2017-06-27 DIAGNOSIS — M6281 Muscle weakness (generalized): Secondary | ICD-10-CM | POA: Diagnosis not present

## 2017-06-27 DIAGNOSIS — I10 Essential (primary) hypertension: Secondary | ICD-10-CM | POA: Diagnosis not present

## 2017-06-27 DIAGNOSIS — I69398 Other sequelae of cerebral infarction: Secondary | ICD-10-CM | POA: Diagnosis not present

## 2017-06-27 DIAGNOSIS — G250 Essential tremor: Secondary | ICD-10-CM | POA: Diagnosis not present

## 2017-06-27 MED ORDER — DOCUSATE SODIUM 100 MG PO CAPS: 100.0000 mg | ORAL_CAPSULE | Freq: Two times a day (BID) | ORAL | 3 refills | Status: DC | PRN

## 2017-06-27 NOTE — Progress Notes (Signed)
[facility] [address] [place]. [place] [address] [phone]       OFFICE FOLLOW-UP NOTE  Ms. Stormie Ventola Date of Birth:  21-Sep-1940 Medical Record Number:  [id number]   HPI: Ms Apsey is a 76 year Caucasian lady seen today for the first office follow-up visit following hospital admission in July 2018 for basilar angioplasty and post procedure strokes.history is obtained from the patient and review of electronic medical records. I have personally .reviewed imaging films.Anjolina Saundersis a 76 y.o.female transferred from an outside ER. She presented on 03/15/17 with 2 episodes of left arm twisting and uncontrollable movements. She was a poor historian and could not demonstrate what happened or corroborate if movements were rhythmic or not. It lasted a few minutes. Each time it happened after taking Dramamine for dizziness and imbalance. CT Brain showed old left cerebellar infarcts, but new right cerebellar infarcts when compared to CT from 2 weeks ago for imbalance in the ER. She had a history of HTN. No DM, smoking. She was on a statin. MRI scan of the brain confirmed scattered acute infarcts in both cerebellar hemispheres and left PCA territory. 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 Estanislado Pandy to represent 95% preocclusive stenosis.. Transthoracic echo showed normal ejection fraction.EEG was normal. Triglycerides were significantly elevated at 783 hence LDL could not be calculated. Hemoglobin A1c was 5.8.she underwent endovascular balloon angioplasty on 03/31/17 with patency of approximately 65-70% by Dr. Estanislado Pandy.she noted improvement in her gait and balance but continues to have some postural dizziness and imbalance particularly when she bends down quickly. She saw Dr. Estanislado Pandy for follow-up  Visit and he plans to do follow-up MRI  scan in a few months. Patient continues to have anxiety and tremulousness. She states she has been compliant with her aspirin and Brilinta which she is tolerating with bruising but no bleeding. She complains of daily headaches which are mild to moderate. She takes Tylenol 4-6 tablets daily which provides relief. She is currently getting home physical and occupational therapy. ROS:   14 system review of systems is positive for  Fatigue, shortness of breath, light sensitivity, constipation, bruising, headache, weakness, tremors, anxiety, joint pain, daytime sleepiness and all other systems negative  PMH:  Past Medical History:  Diagnosis Date  . Asthma   . Hypertension   . Lung nodule < 6cm on CT   . Stroke ([place])   . Thyroid disease     Social History:  Social History   Social History  . Marital status: Divorced    Spouse name: N/A  . Number of children: N/A  . Years of education: N/A   Occupational History  . Not on file.   Social History Main Topics  . Smoking status: Former Smoker    Quit date: 09/17/1971  . Smokeless tobacco: Never Used  . Alcohol use No  . Drug use: No  . Sexual activity: Not on file   Other Topics Concern  . Not on file   Social History Narrative  . No narrative on file    Medications:   Current Outpatient Prescriptions on File Prior to Visit  Medication Sig Dispense Refill  . acetaminophen (TYLENOL) 500 MG tablet Take 250-500 mg by mouth every 6 (six) hours as needed for headache (pain).    Marland Kitchen acetic acid-hydrocortisone (VOSOL-HC) otic solution Place 3 drops into both ears 3 (three) times daily. (Patient taking differently:  Place 3 drops into both ears 3 (three) times daily as needed (itching). ) 10 mL 1  . aspirin EC 81 MG EC tablet Take 1 tablet (81 mg total) by mouth daily.    . Betamethasone Valerate 0.12 % foam Apply 1 application topically daily as needed (scalp itching). 300 g 4  . Cholecalciferol (VITAMIN D3) 5000 units CAPS Take 1 capsule by  mouth.    . clonazePAM (KLONOPIN) 1 MG tablet Take 1 tablet (1 mg total) by mouth at bedtime as needed (anxiety, insomnia). 30 tablet 0  . diclofenac sodium (VOLTAREN) 1 % GEL Apply topically 4 (four) times daily.    Marland Kitchen ECHINACEA-GOLDEN SEAL PO Take 1 capsule by mouth 2 (two) times daily as needed (congestion).    . ezetimibe (ZETIA) 10 MG tablet Take 1 tablet (10 mg total) by mouth daily. 90 tablet 1  . fluticasone (FLONASE) 50 MCG/ACT nasal spray Place 2 sprays into both nostrils daily.    Marland Kitchen ketoconazole (NIZORAL) 2 % shampoo APPLY TO AFFECTED AREA TWICE A WEEK (Patient taking differently: Apply 1 application topically See admin instructions. Use to shampoo hair up to twice a week as needed for itching) 360 mL 0  . levothyroxine (SYNTHROID, LEVOTHROID) 50 MCG tablet Take 1 tablet (50 mcg total) by mouth daily. NEED FOLLOW UP VISIT FOR MORE REFILLS 30 tablet 0  . losartan (COZAAR) 50 MG tablet Take 1 tablet (50 mg total) by mouth daily. 90 tablet 1  . metoprolol tartrate (LOPRESSOR) 25 MG tablet Take 1 tablet (25 mg total) by mouth daily. (Patient taking differently: Take 25 mg by mouth at bedtime. ) 90 tablet 1  . Miconazole Nitrate 2 % POWD by Does not apply route.    . pantoprazole (PROTONIX) 40 MG tablet Take 40 mg by mouth at bedtime.     Marland Kitchen QVAR 40 MCG/ACT inhaler USE 2 INHALATIONS TWICE A DAY (Patient taking differently: USE 2 INHALATIONS TWICE A DAY AS NEEDED FOR SHORTNESS OF BREATH/WHEEZING) 3 g 3  . ranitidine (ZANTAC) 150 MG capsule TAKE 1 CAPSULE TWICE A DAY AS NEEDED FOR HEARTBURN (Patient taking differently: Take 150 mg by mouth daily as needed for heartburn. ) 180 capsule 3  . rosuvastatin (CRESTOR) 40 MG tablet Take 1 tablet (40 mg total) by mouth daily at 6 PM. 90 tablet 1  . ticagrelor (BRILINTA) 90 MG TABS tablet Take 1 tablet (90 mg total) by mouth 2 (two) times daily. 180 tablet 1  . traMADol (ULTRAM) 50 MG tablet Take 1 tablet (50 mg total) by mouth every 12 (twelve) hours as  needed (knee pain). 180 tablet 0   No current facility-administered medications on file prior to visit.     Allergies:   Allergies  Allergen Reactions  . Codeine Shortness Of Breath and Other (See Comments)    Chest pain and swelling  . Hydrocodone-Acetaminophen Anaphylaxis  . Morphine Shortness Of Breath and Other (See Comments)    Chest swelling  . Lipitor [Atorvastatin] Other (See Comments)    Cause joint pain  . Morphine And Related Rash  . Penicillins Rash and Other (See Comments)     PATIENT HAS HAD A PCN REACTION WITH IMMEDIATE RASH, FACIAL/TONGUE/THROAT SWELLING, SOB, OR LIGHTHEADEDNESS WITH HYPOTENSION:  #  #  #  YES  #  #  #   Has patient had a PCN reaction causing severe rash involving mucus membranes or skin necrosis: No Has patient had a PCN reaction that required hospitalization: No Has patient had  a PCN reaction occurring within the last 10 years: No If all of the above answers are "NO", then may proceed with Cephalosporin use.  . Albuterol Other (See Comments)    Shaking (1 puff does not cause the shaking)  . Erythromycin Rash  . Hydroxyzine Pamoate Nausea And Vomiting       . Iodine Rash  . Simvastatin Other (See Comments)    Stomach ache  . Sulfonamide Derivatives Rash    Physical Exam General: well developed, well nourished, anxious elderly Caucasian lady seated, in no evident distress Head: head normocephalic and atraumatic.  Neck: supple with no carotid or supraclavicular bruits Cardiovascular: regular rate and rhythm, no murmurs Musculoskeletal: no deformity Skin:  no rash/petichiae Vascular:  Normal pulses all extremities Vitals:   06/26/17 1431  BP: (!) 147/60  Pulse: 75   Neurologic Exam Mental Status: Awake and fully alert. Oriented to place and time. Recent and remote memory intact. Attention span, concentration and fund of knowledge appropriate. Mood and affect appropriate.  Cranial Nerves: Fundoscopic exam reveals sharp disc margins.  Pupils equal, briskly reactive to light. Extraocular movements full without nystagmus. Visual fields full to confrontation. Hearing intact. Facial sensation intact. Face, tongue, palate moves normally and symmetrically.  Motor: Normal bulk and tone. Normal strength in all tested extremity muscles.bilateral action tremor of outstretched upper extremities. Sensory.: intact to touch ,pinprick .position and vibratory sensation.  Coordination: Rapid alternating movements normal in all extremities. Finger-to-nose and heel-to-shin performed accurately bilaterally.nsteadywhile standing on either foot unsupported. Gait and Station: Arises from chair without difficulty. Stance is normal. Gait demonstrates normal stride length and slight  imbalance . Unable to heel, toe and tandem walk    Reflexes: 1+ and symmetric. Toes downgoing.   NIHSS  1 Modified Rankin  2   ASSESSMENT: 76 year old Caucasian lady with embolic bilateral small cerebellar and left PCA infarcts in July 2018 from symptomatic preocclusive basilar stenosis s/p   basilar angioplasty on 03/26/17. Vascular risk factors of hyperlipidemia and hypertension and intracranial atherosclerosis.New complains of daily headaches likely mixed tension headaches with analgesic rebound    PLAN: I had a long d/w patient about his recent stroke, risk for recurrent stroke/TIAs, personally independently reviewed imaging studies and stroke evaluation results and answered questions.Continue aspirin 81 mg daily and  Brillinta  for secondary stroke prevention and maintain strict control of hypertension with blood pressure goal below 130/90, diabetes with hemoglobin A1c goal below 6.5% and lipids with LDL cholesterol goal below 70 mg/dL. I also advised the patient to eat a healthy diet with plenty of whole grains, cereals, fruits and vegetables, exercise regularly and maintain ideal body weight.ontinue home physical and occupational therapy. Follow-up with Dr. Estanislado Pandy as  scheduled. Consider discussion with her primary physician Dr. Charise Carwin about optimizing her anxiety medications.Trial of Topamax 50 mg daily for her headaches and I counseled her to reduce and minimize taking Tylenol to decrease analgesic rebound headaches. Followup in the future with me in  6 months or call earlier if necessary. Greater than 50% of time during this 35 minute visit was spent on counseling,explanation of diagnosis of basilar stenosis, planning of further management, discussion with patient and family and coordination of care Antony Contras, MD  [facility] [address] [place],  [address]  Phone [phone] Fax [phone] Note: This document was prepared with digital dictation and possible smart phrase technology. Any transcriptional errors that result from this process are unintentional

## 2017-06-27 NOTE — Patient Instructions (Signed)
I had a long d/w patient about his recent stroke, risk for recurrent stroke/TIAs, personally independently reviewed imaging studies and stroke evaluation results and answered questions.Continue aspirin 81 mg daily and  Brillinta  for secondary stroke prevention and maintain strict control of hypertension with blood pressure goal below 130/90, diabetes with hemoglobin A1c goal below 6.5% and lipids with LDL cholesterol goal below 70 mg/dL. I also advised the patient to eat a healthy diet with plenty of whole grains, cereals, fruits and vegetables, exercise regularly and maintain ideal body weight.ontinue home physical and occupational therapy. Follow-up with Dr. Estanislado Pandy as scheduled. Consider discussion with her primary physician Dr. Charise Carwin about optimizing her anxiety medications.Trial of Topamax 50 mg daily for her headaches and I counseled her to reduce and minimize taking Tylenol to decrease analgesic rebound headaches. Followup in the future with me in  6 months or call earlier if necessary.

## 2017-06-27 NOTE — Telephone Encounter (Signed)
Prescription sent to pt's mail order.Kathleen Dunn

## 2017-06-30 ENCOUNTER — Telehealth: Payer: Self-pay

## 2017-06-30 ENCOUNTER — Ambulatory Visit: Payer: Medicare Other | Admitting: Family Medicine

## 2017-06-30 DIAGNOSIS — G250 Essential tremor: Secondary | ICD-10-CM | POA: Diagnosis not present

## 2017-06-30 DIAGNOSIS — I69398 Other sequelae of cerebral infarction: Secondary | ICD-10-CM | POA: Diagnosis not present

## 2017-06-30 DIAGNOSIS — I10 Essential (primary) hypertension: Secondary | ICD-10-CM | POA: Diagnosis not present

## 2017-06-30 DIAGNOSIS — M6281 Muscle weakness (generalized): Secondary | ICD-10-CM | POA: Diagnosis not present

## 2017-06-30 DIAGNOSIS — F411 Generalized anxiety disorder: Secondary | ICD-10-CM | POA: Diagnosis not present

## 2017-06-30 DIAGNOSIS — F329 Major depressive disorder, single episode, unspecified: Secondary | ICD-10-CM | POA: Diagnosis not present

## 2017-06-30 NOTE — Telephone Encounter (Signed)
Rn call patient about scheduling 6 month follow up. PT was here for a office visit, when the electricity, lights went out due the weather. Pt stated she did not want to schedule a 6 month follow up. PT stated sometimes she travels a lot and may not be in town during that time frame. PT will know in 2 months if she will be in town in 6 months. PT will call back once she knows.

## 2017-06-30 NOTE — Telephone Encounter (Signed)
Pt also stated she did not want anxiety listed anywhere in her chart as a medical condition. Rn stated in [place] note he mention she speak with her PCP about anxiety medication regimen. Pt continue to state she did not want any other MD seeing that. Rn stated a message will be sent Dr. Leonie Man. Pt also stated her headaches are cause by when she exercises a lot. She is keeping a dairy of it. RN remind pt that Dr Leonie Man is working in the hospital this week  Pt verbalized understanding.

## 2017-06-30 NOTE — Telephone Encounter (Signed)
I called the patient's listed number and left message on answering machine to call me back to discuss her concerns.

## 2017-07-01 DIAGNOSIS — I10 Essential (primary) hypertension: Secondary | ICD-10-CM | POA: Diagnosis not present

## 2017-07-01 DIAGNOSIS — I69398 Other sequelae of cerebral infarction: Secondary | ICD-10-CM | POA: Diagnosis not present

## 2017-07-01 DIAGNOSIS — F329 Major depressive disorder, single episode, unspecified: Secondary | ICD-10-CM | POA: Diagnosis not present

## 2017-07-01 DIAGNOSIS — F411 Generalized anxiety disorder: Secondary | ICD-10-CM | POA: Diagnosis not present

## 2017-07-01 DIAGNOSIS — M6281 Muscle weakness (generalized): Secondary | ICD-10-CM | POA: Diagnosis not present

## 2017-07-01 DIAGNOSIS — G250 Essential tremor: Secondary | ICD-10-CM | POA: Diagnosis not present

## 2017-07-02 ENCOUNTER — Telehealth: Payer: Self-pay | Admitting: Family Medicine

## 2017-07-02 DIAGNOSIS — I10 Essential (primary) hypertension: Secondary | ICD-10-CM | POA: Diagnosis not present

## 2017-07-02 DIAGNOSIS — R5382 Chronic fatigue, unspecified: Secondary | ICD-10-CM

## 2017-07-02 DIAGNOSIS — F329 Major depressive disorder, single episode, unspecified: Secondary | ICD-10-CM | POA: Diagnosis not present

## 2017-07-02 DIAGNOSIS — M6281 Muscle weakness (generalized): Secondary | ICD-10-CM | POA: Diagnosis not present

## 2017-07-02 DIAGNOSIS — F411 Generalized anxiety disorder: Secondary | ICD-10-CM | POA: Diagnosis not present

## 2017-07-02 DIAGNOSIS — I69398 Other sequelae of cerebral infarction: Secondary | ICD-10-CM | POA: Diagnosis not present

## 2017-07-02 DIAGNOSIS — I6300 Cerebral infarction due to thrombosis of unspecified precerebral artery: Secondary | ICD-10-CM

## 2017-07-02 DIAGNOSIS — Z8673 Personal history of transient ischemic attack (TIA), and cerebral infarction without residual deficits: Secondary | ICD-10-CM

## 2017-07-02 DIAGNOSIS — I639 Cerebral infarction, unspecified: Secondary | ICD-10-CM

## 2017-07-02 DIAGNOSIS — G250 Essential tremor: Secondary | ICD-10-CM | POA: Diagnosis not present

## 2017-07-02 NOTE — Telephone Encounter (Signed)
Pt wants [facility] removed from her chart. She only wants to use [employer]  [address] [place], [place] - [address].  Pt also wants an order for a home aid to assist her with writing, laundry, and things around her apartment. Pt states since her stroke its hard to grip things with her hands and she is not supposed to bend her head down.   Pt also wants an order for at least 6 weeks of speech therapy, to help with her cognitive ability.   Pt is currently with [facility].

## 2017-07-03 DIAGNOSIS — I10 Essential (primary) hypertension: Secondary | ICD-10-CM | POA: Diagnosis not present

## 2017-07-03 DIAGNOSIS — G250 Essential tremor: Secondary | ICD-10-CM | POA: Diagnosis not present

## 2017-07-03 DIAGNOSIS — M6281 Muscle weakness (generalized): Secondary | ICD-10-CM | POA: Diagnosis not present

## 2017-07-03 DIAGNOSIS — I69398 Other sequelae of cerebral infarction: Secondary | ICD-10-CM | POA: Diagnosis not present

## 2017-07-03 DIAGNOSIS — F411 Generalized anxiety disorder: Secondary | ICD-10-CM | POA: Diagnosis not present

## 2017-07-03 DIAGNOSIS — F329 Major depressive disorder, single episode, unspecified: Secondary | ICD-10-CM | POA: Diagnosis not present

## 2017-07-03 NOTE — Telephone Encounter (Signed)
Spoke with [facility] home [facility] rep, new referral entered.

## 2017-07-04 ENCOUNTER — Telehealth: Payer: Self-pay

## 2017-07-04 NOTE — Telephone Encounter (Signed)
Ok to refill flonase with 5 rf.  What [facility] product has she already tried? I don't want to recommend something she has already tried.

## 2017-07-04 NOTE — Telephone Encounter (Signed)
Pt is requesting a refill on flonase nasal spray.  Pt is constipated.  Please advise as to what she can take.  She has been using [facility] brand and still her stools are hard.  Please advise.

## 2017-07-07 DIAGNOSIS — I69398 Other sequelae of cerebral infarction: Secondary | ICD-10-CM | POA: Diagnosis not present

## 2017-07-07 DIAGNOSIS — F411 Generalized anxiety disorder: Secondary | ICD-10-CM | POA: Diagnosis not present

## 2017-07-07 DIAGNOSIS — I10 Essential (primary) hypertension: Secondary | ICD-10-CM | POA: Diagnosis not present

## 2017-07-07 DIAGNOSIS — F329 Major depressive disorder, single episode, unspecified: Secondary | ICD-10-CM | POA: Diagnosis not present

## 2017-07-07 DIAGNOSIS — M6281 Muscle weakness (generalized): Secondary | ICD-10-CM | POA: Diagnosis not present

## 2017-07-07 DIAGNOSIS — G250 Essential tremor: Secondary | ICD-10-CM | POA: Diagnosis not present

## 2017-07-08 DIAGNOSIS — I69398 Other sequelae of cerebral infarction: Secondary | ICD-10-CM | POA: Diagnosis not present

## 2017-07-08 DIAGNOSIS — G250 Essential tremor: Secondary | ICD-10-CM | POA: Diagnosis not present

## 2017-07-08 DIAGNOSIS — F411 Generalized anxiety disorder: Secondary | ICD-10-CM | POA: Diagnosis not present

## 2017-07-08 DIAGNOSIS — I10 Essential (primary) hypertension: Secondary | ICD-10-CM | POA: Diagnosis not present

## 2017-07-08 DIAGNOSIS — F329 Major depressive disorder, single episode, unspecified: Secondary | ICD-10-CM | POA: Diagnosis not present

## 2017-07-08 DIAGNOSIS — M6281 Muscle weakness (generalized): Secondary | ICD-10-CM | POA: Diagnosis not present

## 2017-07-08 MED ORDER — FLUTICASONE PROPIONATE 50 MCG/ACT NA SUSP: 2.0000 | Freq: Every day | NASAL | 3 refills | Status: AC

## 2017-07-08 NOTE — Telephone Encounter (Signed)
Pt states she just used the [facility] brand stool softener oral tablet. She has since stated drinking a lot of juices - and this has seemed to help. She also has also had some relief from [place].   Will send over Flonase.   Pt also request an Rx for hair loss. States PCP used to have her on a shampoo that helped, she would like it again or something that works better.

## 2017-07-09 ENCOUNTER — Telehealth: Payer: Self-pay | Admitting: *Deleted

## 2017-07-09 MED ORDER — KETOCONAZOLE 2 % EX SHAM: MEDICATED_SHAMPOO | CUTANEOUS | 1 refills | Status: DC

## 2017-07-09 NOTE — Telephone Encounter (Signed)
Regards to constipation: Milk of magnesia is perfectly fine.  MiraLAX which is a powder that he mixed with water is also a good option even if she needs a stimulant laxative such as Ex-Lax or Senokot as those can work well as well for the short-term.  The softener is a good idea as a maintenance medication to help keep her bowels moving when she is able to get things cleaned out.  Regards to the shampoo: I believe it was the ketoconazole shampoo.  That is the only shampoo that I could see on her medication list that we have filled in the past.  She also should have a prescription for a topical steroid foam to help with scalp itching but this also can promote hair growth as well.

## 2017-07-09 NOTE — Telephone Encounter (Signed)
LVM for Kathleen Dunn with [facility] giving ongoing VO for speech pathology for patient.Kathleen Dunn

## 2017-07-10 DIAGNOSIS — I69398 Other sequelae of cerebral infarction: Secondary | ICD-10-CM | POA: Diagnosis not present

## 2017-07-10 DIAGNOSIS — G250 Essential tremor: Secondary | ICD-10-CM | POA: Diagnosis not present

## 2017-07-10 DIAGNOSIS — F411 Generalized anxiety disorder: Secondary | ICD-10-CM | POA: Diagnosis not present

## 2017-07-10 DIAGNOSIS — F329 Major depressive disorder, single episode, unspecified: Secondary | ICD-10-CM | POA: Diagnosis not present

## 2017-07-10 DIAGNOSIS — M6281 Muscle weakness (generalized): Secondary | ICD-10-CM | POA: Diagnosis not present

## 2017-07-10 DIAGNOSIS — I10 Essential (primary) hypertension: Secondary | ICD-10-CM | POA: Diagnosis not present

## 2017-07-10 NOTE — Telephone Encounter (Signed)
Rx was sent by PCP.  

## 2017-07-11 DIAGNOSIS — I69398 Other sequelae of cerebral infarction: Secondary | ICD-10-CM | POA: Diagnosis not present

## 2017-07-11 DIAGNOSIS — M6281 Muscle weakness (generalized): Secondary | ICD-10-CM | POA: Diagnosis not present

## 2017-07-11 DIAGNOSIS — I10 Essential (primary) hypertension: Secondary | ICD-10-CM | POA: Diagnosis not present

## 2017-07-11 DIAGNOSIS — G250 Essential tremor: Secondary | ICD-10-CM | POA: Diagnosis not present

## 2017-07-11 DIAGNOSIS — F411 Generalized anxiety disorder: Secondary | ICD-10-CM | POA: Diagnosis not present

## 2017-07-11 DIAGNOSIS — F329 Major depressive disorder, single episode, unspecified: Secondary | ICD-10-CM | POA: Diagnosis not present

## 2017-07-14 DIAGNOSIS — I69398 Other sequelae of cerebral infarction: Secondary | ICD-10-CM | POA: Diagnosis not present

## 2017-07-14 DIAGNOSIS — M6281 Muscle weakness (generalized): Secondary | ICD-10-CM | POA: Diagnosis not present

## 2017-07-14 DIAGNOSIS — F329 Major depressive disorder, single episode, unspecified: Secondary | ICD-10-CM | POA: Diagnosis not present

## 2017-07-14 DIAGNOSIS — I10 Essential (primary) hypertension: Secondary | ICD-10-CM | POA: Diagnosis not present

## 2017-07-14 DIAGNOSIS — F411 Generalized anxiety disorder: Secondary | ICD-10-CM | POA: Diagnosis not present

## 2017-07-14 DIAGNOSIS — G250 Essential tremor: Secondary | ICD-10-CM | POA: Diagnosis not present

## 2017-07-15 DIAGNOSIS — F329 Major depressive disorder, single episode, unspecified: Secondary | ICD-10-CM | POA: Diagnosis not present

## 2017-07-15 DIAGNOSIS — M6281 Muscle weakness (generalized): Secondary | ICD-10-CM | POA: Diagnosis not present

## 2017-07-15 DIAGNOSIS — I69398 Other sequelae of cerebral infarction: Secondary | ICD-10-CM | POA: Diagnosis not present

## 2017-07-15 DIAGNOSIS — G250 Essential tremor: Secondary | ICD-10-CM | POA: Diagnosis not present

## 2017-07-15 DIAGNOSIS — I10 Essential (primary) hypertension: Secondary | ICD-10-CM | POA: Diagnosis not present

## 2017-07-15 DIAGNOSIS — F411 Generalized anxiety disorder: Secondary | ICD-10-CM | POA: Diagnosis not present

## 2017-07-16 DIAGNOSIS — G250 Essential tremor: Secondary | ICD-10-CM | POA: Diagnosis not present

## 2017-07-16 DIAGNOSIS — I69398 Other sequelae of cerebral infarction: Secondary | ICD-10-CM | POA: Diagnosis not present

## 2017-07-16 DIAGNOSIS — M6281 Muscle weakness (generalized): Secondary | ICD-10-CM | POA: Diagnosis not present

## 2017-07-16 DIAGNOSIS — F411 Generalized anxiety disorder: Secondary | ICD-10-CM | POA: Diagnosis not present

## 2017-07-16 DIAGNOSIS — F329 Major depressive disorder, single episode, unspecified: Secondary | ICD-10-CM | POA: Diagnosis not present

## 2017-07-16 DIAGNOSIS — I10 Essential (primary) hypertension: Secondary | ICD-10-CM | POA: Diagnosis not present

## 2017-07-17 ENCOUNTER — Telehealth (HOSPITAL_COMMUNITY): Payer: Self-pay | Admitting: Radiology

## 2017-07-17 DIAGNOSIS — F411 Generalized anxiety disorder: Secondary | ICD-10-CM | POA: Diagnosis not present

## 2017-07-17 DIAGNOSIS — I69398 Other sequelae of cerebral infarction: Secondary | ICD-10-CM | POA: Diagnosis not present

## 2017-07-17 DIAGNOSIS — I10 Essential (primary) hypertension: Secondary | ICD-10-CM | POA: Diagnosis not present

## 2017-07-17 DIAGNOSIS — G250 Essential tremor: Secondary | ICD-10-CM | POA: Diagnosis not present

## 2017-07-17 DIAGNOSIS — F329 Major depressive disorder, single episode, unspecified: Secondary | ICD-10-CM | POA: Diagnosis not present

## 2017-07-17 DIAGNOSIS — M6281 Muscle weakness (generalized): Secondary | ICD-10-CM | POA: Diagnosis not present

## 2017-07-17 NOTE — Telephone Encounter (Signed)
Pt called and stated that she needs to have dental work done. She has concerns about her Brilinta. Gave note to PA to call back to advise pt. JM [id number]

## 2017-07-18 ENCOUNTER — Telehealth (HOSPITAL_COMMUNITY): Payer: Self-pay | Admitting: *Deleted

## 2017-07-18 DIAGNOSIS — I10 Essential (primary) hypertension: Secondary | ICD-10-CM | POA: Diagnosis not present

## 2017-07-18 DIAGNOSIS — G250 Essential tremor: Secondary | ICD-10-CM | POA: Diagnosis not present

## 2017-07-18 DIAGNOSIS — F411 Generalized anxiety disorder: Secondary | ICD-10-CM | POA: Diagnosis not present

## 2017-07-18 DIAGNOSIS — M6281 Muscle weakness (generalized): Secondary | ICD-10-CM | POA: Diagnosis not present

## 2017-07-18 DIAGNOSIS — I69398 Other sequelae of cerebral infarction: Secondary | ICD-10-CM | POA: Diagnosis not present

## 2017-07-18 DIAGNOSIS — F329 Major depressive disorder, single episode, unspecified: Secondary | ICD-10-CM | POA: Diagnosis not present

## 2017-07-18 NOTE — Telephone Encounter (Signed)
Kathleen Franklin PA-C called and spoke with patient.  We will call pt and schedule MRA/MRI  and follow up appointment with Dr. Estanislado Pandy.  SHe states Xanax po does not sedate her well enough; she says Dr. Estanislado Pandy will sedate her this procedure.  Will confirm with Dr. Estanislado Pandy.

## 2017-07-20 DIAGNOSIS — H00019 Hordeolum externum unspecified eye, unspecified eyelid: Secondary | ICD-10-CM | POA: Diagnosis not present

## 2017-07-20 DIAGNOSIS — H04123 Dry eye syndrome of bilateral lacrimal glands: Secondary | ICD-10-CM | POA: Diagnosis not present

## 2017-07-21 DIAGNOSIS — I69398 Other sequelae of cerebral infarction: Secondary | ICD-10-CM | POA: Diagnosis not present

## 2017-07-21 DIAGNOSIS — I10 Essential (primary) hypertension: Secondary | ICD-10-CM | POA: Diagnosis not present

## 2017-07-21 DIAGNOSIS — G250 Essential tremor: Secondary | ICD-10-CM | POA: Diagnosis not present

## 2017-07-21 DIAGNOSIS — F411 Generalized anxiety disorder: Secondary | ICD-10-CM | POA: Diagnosis not present

## 2017-07-21 DIAGNOSIS — M6281 Muscle weakness (generalized): Secondary | ICD-10-CM | POA: Diagnosis not present

## 2017-07-21 DIAGNOSIS — F329 Major depressive disorder, single episode, unspecified: Secondary | ICD-10-CM | POA: Diagnosis not present

## 2017-07-22 ENCOUNTER — Telehealth: Payer: Self-pay | Admitting: Family Medicine

## 2017-07-22 DIAGNOSIS — M6281 Muscle weakness (generalized): Secondary | ICD-10-CM | POA: Diagnosis not present

## 2017-07-22 DIAGNOSIS — F411 Generalized anxiety disorder: Secondary | ICD-10-CM | POA: Diagnosis not present

## 2017-07-22 DIAGNOSIS — F329 Major depressive disorder, single episode, unspecified: Secondary | ICD-10-CM | POA: Diagnosis not present

## 2017-07-22 DIAGNOSIS — G250 Essential tremor: Secondary | ICD-10-CM | POA: Diagnosis not present

## 2017-07-22 DIAGNOSIS — I10 Essential (primary) hypertension: Secondary | ICD-10-CM | POA: Diagnosis not present

## 2017-07-22 DIAGNOSIS — I69398 Other sequelae of cerebral infarction: Secondary | ICD-10-CM | POA: Diagnosis not present

## 2017-07-22 NOTE — Telephone Encounter (Signed)
Received call from [facility] with [facility] for the following verbal orders:  PT: 2x a week for 6 weeks Nurse Aid: 2x week for 3 weeks  Verbal given

## 2017-07-22 NOTE — Telephone Encounter (Signed)
Patient called adv previously talked to you about her eye concerns and you mentioned pt seeing opthamologist and she would like to know the name of that eye doctor that is in [place]. She wants to try and call them between today and Thursday adv will send a message. Thanks

## 2017-07-22 NOTE — Telephone Encounter (Signed)
OK, thank you.

## 2017-07-23 ENCOUNTER — Telehealth: Payer: Self-pay | Admitting: *Deleted

## 2017-07-23 DIAGNOSIS — F411 Generalized anxiety disorder: Secondary | ICD-10-CM | POA: Diagnosis not present

## 2017-07-23 DIAGNOSIS — M6281 Muscle weakness (generalized): Secondary | ICD-10-CM | POA: Diagnosis not present

## 2017-07-23 DIAGNOSIS — I10 Essential (primary) hypertension: Secondary | ICD-10-CM | POA: Diagnosis not present

## 2017-07-23 DIAGNOSIS — I69398 Other sequelae of cerebral infarction: Secondary | ICD-10-CM | POA: Diagnosis not present

## 2017-07-23 DIAGNOSIS — F329 Major depressive disorder, single episode, unspecified: Secondary | ICD-10-CM | POA: Diagnosis not present

## 2017-07-23 DIAGNOSIS — G250 Essential tremor: Secondary | ICD-10-CM | POA: Diagnosis not present

## 2017-07-23 NOTE — Telephone Encounter (Signed)
OK. Can refer to [facility].

## 2017-07-23 NOTE — Telephone Encounter (Signed)
Michelle with [facility] called asking for VO to assist pt with transportation. She stated that she will be going out to the pt's home on Friday .Audelia Hives [place]

## 2017-07-24 ENCOUNTER — Other Ambulatory Visit (HOSPITAL_COMMUNITY): Payer: Self-pay | Admitting: Interventional Radiology

## 2017-07-24 ENCOUNTER — Telehealth (HOSPITAL_COMMUNITY): Payer: Self-pay

## 2017-07-24 DIAGNOSIS — H01001 Unspecified blepharitis right upper eyelid: Secondary | ICD-10-CM | POA: Diagnosis not present

## 2017-07-24 DIAGNOSIS — I651 Occlusion and stenosis of basilar artery: Secondary | ICD-10-CM

## 2017-07-24 DIAGNOSIS — H0012 Chalazion right lower eyelid: Secondary | ICD-10-CM | POA: Diagnosis not present

## 2017-07-24 DIAGNOSIS — H02889 Meibomian gland dysfunction of unspecified eye, unspecified eyelid: Secondary | ICD-10-CM | POA: Diagnosis not present

## 2017-07-24 DIAGNOSIS — H0011 Chalazion right upper eyelid: Secondary | ICD-10-CM | POA: Diagnosis not present

## 2017-07-24 NOTE — Telephone Encounter (Signed)
Called to schedule mri/mra, left message for pt to return call. AW

## 2017-07-25 DIAGNOSIS — I10 Essential (primary) hypertension: Secondary | ICD-10-CM | POA: Diagnosis not present

## 2017-07-25 DIAGNOSIS — M6281 Muscle weakness (generalized): Secondary | ICD-10-CM | POA: Diagnosis not present

## 2017-07-25 DIAGNOSIS — F411 Generalized anxiety disorder: Secondary | ICD-10-CM | POA: Diagnosis not present

## 2017-07-25 DIAGNOSIS — G250 Essential tremor: Secondary | ICD-10-CM | POA: Diagnosis not present

## 2017-07-25 DIAGNOSIS — I69398 Other sequelae of cerebral infarction: Secondary | ICD-10-CM | POA: Diagnosis not present

## 2017-07-25 DIAGNOSIS — F329 Major depressive disorder, single episode, unspecified: Secondary | ICD-10-CM | POA: Diagnosis not present

## 2017-07-28 DIAGNOSIS — F329 Major depressive disorder, single episode, unspecified: Secondary | ICD-10-CM | POA: Diagnosis not present

## 2017-07-28 DIAGNOSIS — I10 Essential (primary) hypertension: Secondary | ICD-10-CM | POA: Diagnosis not present

## 2017-07-28 DIAGNOSIS — M6281 Muscle weakness (generalized): Secondary | ICD-10-CM | POA: Diagnosis not present

## 2017-07-28 DIAGNOSIS — F411 Generalized anxiety disorder: Secondary | ICD-10-CM | POA: Diagnosis not present

## 2017-07-28 DIAGNOSIS — I69398 Other sequelae of cerebral infarction: Secondary | ICD-10-CM | POA: Diagnosis not present

## 2017-07-28 DIAGNOSIS — G250 Essential tremor: Secondary | ICD-10-CM | POA: Diagnosis not present

## 2017-07-29 DIAGNOSIS — M6281 Muscle weakness (generalized): Secondary | ICD-10-CM | POA: Diagnosis not present

## 2017-07-29 DIAGNOSIS — I69398 Other sequelae of cerebral infarction: Secondary | ICD-10-CM | POA: Diagnosis not present

## 2017-07-29 DIAGNOSIS — F411 Generalized anxiety disorder: Secondary | ICD-10-CM | POA: Diagnosis not present

## 2017-07-29 DIAGNOSIS — G250 Essential tremor: Secondary | ICD-10-CM | POA: Diagnosis not present

## 2017-07-29 DIAGNOSIS — I10 Essential (primary) hypertension: Secondary | ICD-10-CM | POA: Diagnosis not present

## 2017-07-29 DIAGNOSIS — F329 Major depressive disorder, single episode, unspecified: Secondary | ICD-10-CM | POA: Diagnosis not present

## 2017-07-30 ENCOUNTER — Telehealth (HOSPITAL_COMMUNITY): Payer: Self-pay

## 2017-07-30 DIAGNOSIS — I69398 Other sequelae of cerebral infarction: Secondary | ICD-10-CM | POA: Diagnosis not present

## 2017-07-30 DIAGNOSIS — G250 Essential tremor: Secondary | ICD-10-CM | POA: Diagnosis not present

## 2017-07-30 DIAGNOSIS — M6281 Muscle weakness (generalized): Secondary | ICD-10-CM | POA: Diagnosis not present

## 2017-07-30 DIAGNOSIS — F411 Generalized anxiety disorder: Secondary | ICD-10-CM | POA: Diagnosis not present

## 2017-07-30 DIAGNOSIS — I10 Essential (primary) hypertension: Secondary | ICD-10-CM | POA: Diagnosis not present

## 2017-07-30 DIAGNOSIS — F329 Major depressive disorder, single episode, unspecified: Secondary | ICD-10-CM | POA: Diagnosis not present

## 2017-07-30 NOTE — Telephone Encounter (Signed)
Called to schedule anigo, left message for pt to return call. AW

## 2017-07-31 ENCOUNTER — Other Ambulatory Visit: Payer: Self-pay

## 2017-07-31 ENCOUNTER — Telehealth: Payer: Self-pay | Admitting: Emergency Medicine

## 2017-07-31 ENCOUNTER — Other Ambulatory Visit: Payer: Self-pay | Admitting: Emergency Medicine

## 2017-07-31 DIAGNOSIS — I69398 Other sequelae of cerebral infarction: Secondary | ICD-10-CM | POA: Diagnosis not present

## 2017-07-31 DIAGNOSIS — F329 Major depressive disorder, single episode, unspecified: Secondary | ICD-10-CM | POA: Diagnosis not present

## 2017-07-31 DIAGNOSIS — I10 Essential (primary) hypertension: Secondary | ICD-10-CM | POA: Diagnosis not present

## 2017-07-31 DIAGNOSIS — G250 Essential tremor: Secondary | ICD-10-CM | POA: Diagnosis not present

## 2017-07-31 DIAGNOSIS — M6281 Muscle weakness (generalized): Secondary | ICD-10-CM | POA: Diagnosis not present

## 2017-07-31 DIAGNOSIS — F411 Generalized anxiety disorder: Secondary | ICD-10-CM | POA: Diagnosis not present

## 2017-07-31 MED ORDER — TRAMADOL HCL 50 MG PO TABS: 50.0000 mg | ORAL_TABLET | Freq: Two times a day (BID) | ORAL | 0 refills | Status: DC | PRN

## 2017-07-31 MED ORDER — TRAMADOL HCL 50 MG PO TABS: 50.0000 mg | ORAL_TABLET | Freq: Two times a day (BID) | ORAL | 0 refills | Status: AC | PRN

## 2017-07-31 NOTE — Telephone Encounter (Signed)
Patient requesting rx for diclofenac 1 % gel; send to [facility]. pk

## 2017-08-01 ENCOUNTER — Other Ambulatory Visit (HOSPITAL_COMMUNITY): Payer: Self-pay | Admitting: Interventional Radiology

## 2017-08-01 DIAGNOSIS — I69398 Other sequelae of cerebral infarction: Secondary | ICD-10-CM | POA: Diagnosis not present

## 2017-08-01 DIAGNOSIS — I6322 Cerebral infarction due to unspecified occlusion or stenosis of basilar arteries: Secondary | ICD-10-CM

## 2017-08-01 DIAGNOSIS — F411 Generalized anxiety disorder: Secondary | ICD-10-CM | POA: Diagnosis not present

## 2017-08-01 DIAGNOSIS — F329 Major depressive disorder, single episode, unspecified: Secondary | ICD-10-CM | POA: Diagnosis not present

## 2017-08-01 DIAGNOSIS — G250 Essential tremor: Secondary | ICD-10-CM | POA: Diagnosis not present

## 2017-08-01 DIAGNOSIS — M6281 Muscle weakness (generalized): Secondary | ICD-10-CM | POA: Diagnosis not present

## 2017-08-01 DIAGNOSIS — I10 Essential (primary) hypertension: Secondary | ICD-10-CM | POA: Diagnosis not present

## 2017-08-01 MED ORDER — DICLOFENAC SODIUM 1 % TD GEL: 4.0000 g | Freq: Four times a day (QID) | TRANSDERMAL | 5 refills | Status: AC

## 2017-08-01 NOTE — Telephone Encounter (Signed)
Okay, new prescription sent to [facility].

## 2017-08-05 DIAGNOSIS — F329 Major depressive disorder, single episode, unspecified: Secondary | ICD-10-CM | POA: Diagnosis not present

## 2017-08-05 DIAGNOSIS — G250 Essential tremor: Secondary | ICD-10-CM | POA: Diagnosis not present

## 2017-08-05 DIAGNOSIS — M6281 Muscle weakness (generalized): Secondary | ICD-10-CM | POA: Diagnosis not present

## 2017-08-05 DIAGNOSIS — I10 Essential (primary) hypertension: Secondary | ICD-10-CM | POA: Diagnosis not present

## 2017-08-05 DIAGNOSIS — F411 Generalized anxiety disorder: Secondary | ICD-10-CM | POA: Diagnosis not present

## 2017-08-05 DIAGNOSIS — I69398 Other sequelae of cerebral infarction: Secondary | ICD-10-CM | POA: Diagnosis not present

## 2017-08-06 ENCOUNTER — Other Ambulatory Visit: Payer: Self-pay | Admitting: Student

## 2017-08-06 ENCOUNTER — Ambulatory Visit: Payer: Medicare Other | Admitting: Family Medicine

## 2017-08-06 ENCOUNTER — Other Ambulatory Visit: Payer: Self-pay | Admitting: Radiology

## 2017-08-06 DIAGNOSIS — F411 Generalized anxiety disorder: Secondary | ICD-10-CM | POA: Diagnosis not present

## 2017-08-06 DIAGNOSIS — I69398 Other sequelae of cerebral infarction: Secondary | ICD-10-CM | POA: Diagnosis not present

## 2017-08-06 DIAGNOSIS — F329 Major depressive disorder, single episode, unspecified: Secondary | ICD-10-CM | POA: Diagnosis not present

## 2017-08-06 DIAGNOSIS — G250 Essential tremor: Secondary | ICD-10-CM | POA: Diagnosis not present

## 2017-08-06 DIAGNOSIS — I10 Essential (primary) hypertension: Secondary | ICD-10-CM | POA: Diagnosis not present

## 2017-08-06 DIAGNOSIS — M6281 Muscle weakness (generalized): Secondary | ICD-10-CM | POA: Diagnosis not present

## 2017-08-08 ENCOUNTER — Encounter (HOSPITAL_COMMUNITY): Payer: Self-pay

## 2017-08-08 ENCOUNTER — Ambulatory Visit (HOSPITAL_COMMUNITY): Admission: RE | Admit: 2017-08-08 | Payer: Medicare Other | Source: Ambulatory Visit

## 2017-08-08 DIAGNOSIS — F329 Major depressive disorder, single episode, unspecified: Secondary | ICD-10-CM | POA: Diagnosis not present

## 2017-08-08 DIAGNOSIS — E7800 Pure hypercholesterolemia, unspecified: Secondary | ICD-10-CM | POA: Diagnosis not present

## 2017-08-08 DIAGNOSIS — M6281 Muscle weakness (generalized): Secondary | ICD-10-CM | POA: Diagnosis not present

## 2017-08-08 DIAGNOSIS — Z7982 Long term (current) use of aspirin: Secondary | ICD-10-CM | POA: Diagnosis not present

## 2017-08-08 DIAGNOSIS — R911 Solitary pulmonary nodule: Secondary | ICD-10-CM | POA: Diagnosis not present

## 2017-08-08 DIAGNOSIS — M1711 Unilateral primary osteoarthritis, right knee: Secondary | ICD-10-CM | POA: Diagnosis not present

## 2017-08-08 DIAGNOSIS — G250 Essential tremor: Secondary | ICD-10-CM | POA: Diagnosis not present

## 2017-08-08 DIAGNOSIS — E039 Hypothyroidism, unspecified: Secondary | ICD-10-CM | POA: Diagnosis not present

## 2017-08-08 DIAGNOSIS — I2510 Atherosclerotic heart disease of native coronary artery without angina pectoris: Secondary | ICD-10-CM | POA: Diagnosis not present

## 2017-08-08 DIAGNOSIS — G4700 Insomnia, unspecified: Secondary | ICD-10-CM | POA: Diagnosis not present

## 2017-08-08 DIAGNOSIS — I6523 Occlusion and stenosis of bilateral carotid arteries: Secondary | ICD-10-CM | POA: Diagnosis not present

## 2017-08-08 DIAGNOSIS — F411 Generalized anxiety disorder: Secondary | ICD-10-CM | POA: Diagnosis not present

## 2017-08-08 DIAGNOSIS — I69398 Other sequelae of cerebral infarction: Secondary | ICD-10-CM | POA: Diagnosis not present

## 2017-08-08 DIAGNOSIS — D510 Vitamin B12 deficiency anemia due to intrinsic factor deficiency: Secondary | ICD-10-CM | POA: Diagnosis not present

## 2017-08-08 DIAGNOSIS — J45909 Unspecified asthma, uncomplicated: Secondary | ICD-10-CM | POA: Diagnosis not present

## 2017-08-08 DIAGNOSIS — E559 Vitamin D deficiency, unspecified: Secondary | ICD-10-CM | POA: Diagnosis not present

## 2017-08-08 DIAGNOSIS — Z87891 Personal history of nicotine dependence: Secondary | ICD-10-CM | POA: Diagnosis not present

## 2017-08-08 DIAGNOSIS — I10 Essential (primary) hypertension: Secondary | ICD-10-CM | POA: Diagnosis not present

## 2017-08-08 DIAGNOSIS — Z9181 History of falling: Secondary | ICD-10-CM | POA: Diagnosis not present

## 2017-08-11 ENCOUNTER — Telehealth: Payer: Self-pay

## 2017-08-11 DIAGNOSIS — G250 Essential tremor: Secondary | ICD-10-CM | POA: Diagnosis not present

## 2017-08-11 DIAGNOSIS — F329 Major depressive disorder, single episode, unspecified: Secondary | ICD-10-CM | POA: Diagnosis not present

## 2017-08-11 DIAGNOSIS — M6281 Muscle weakness (generalized): Secondary | ICD-10-CM | POA: Diagnosis not present

## 2017-08-11 DIAGNOSIS — I69398 Other sequelae of cerebral infarction: Secondary | ICD-10-CM | POA: Diagnosis not present

## 2017-08-11 DIAGNOSIS — I10 Essential (primary) hypertension: Secondary | ICD-10-CM | POA: Diagnosis not present

## 2017-08-11 DIAGNOSIS — F411 Generalized anxiety disorder: Secondary | ICD-10-CM | POA: Diagnosis not present

## 2017-08-11 NOTE — Telephone Encounter (Signed)
FYIMagda Dunn Gains RN from [facility] called and was given verbal approval for skilled nursing for 1 week and and 1 extra visit if needed. PT and Occupational therapy orders were also given for balance problems. Kathleen Dunn wanted to let Dr. Ronney Asters know that the Pt is being seen by Dr. 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 Pt is taking oral Doxycyline and ding eyelid scrubs with warm compress. Pt is also doing artificial tears. Pt will reschedule her interventional procedure due to illness.

## 2017-08-11 NOTE — Telephone Encounter (Signed)
Magda Paganini Gains RN phone # is [phone] if needed.

## 2017-08-12 DIAGNOSIS — G250 Essential tremor: Secondary | ICD-10-CM | POA: Diagnosis not present

## 2017-08-12 DIAGNOSIS — M6281 Muscle weakness (generalized): Secondary | ICD-10-CM | POA: Diagnosis not present

## 2017-08-12 DIAGNOSIS — F329 Major depressive disorder, single episode, unspecified: Secondary | ICD-10-CM | POA: Diagnosis not present

## 2017-08-12 DIAGNOSIS — F411 Generalized anxiety disorder: Secondary | ICD-10-CM | POA: Diagnosis not present

## 2017-08-12 DIAGNOSIS — I69398 Other sequelae of cerebral infarction: Secondary | ICD-10-CM | POA: Diagnosis not present

## 2017-08-12 DIAGNOSIS — I10 Essential (primary) hypertension: Secondary | ICD-10-CM | POA: Diagnosis not present

## 2017-08-14 DIAGNOSIS — I10 Essential (primary) hypertension: Secondary | ICD-10-CM | POA: Diagnosis not present

## 2017-08-14 DIAGNOSIS — F329 Major depressive disorder, single episode, unspecified: Secondary | ICD-10-CM | POA: Diagnosis not present

## 2017-08-14 DIAGNOSIS — F411 Generalized anxiety disorder: Secondary | ICD-10-CM | POA: Diagnosis not present

## 2017-08-14 DIAGNOSIS — I69398 Other sequelae of cerebral infarction: Secondary | ICD-10-CM | POA: Diagnosis not present

## 2017-08-14 DIAGNOSIS — M6281 Muscle weakness (generalized): Secondary | ICD-10-CM | POA: Diagnosis not present

## 2017-08-14 DIAGNOSIS — G250 Essential tremor: Secondary | ICD-10-CM | POA: Diagnosis not present

## 2017-08-15 ENCOUNTER — Telehealth: Payer: Self-pay

## 2017-08-15 DIAGNOSIS — M6281 Muscle weakness (generalized): Secondary | ICD-10-CM | POA: Diagnosis not present

## 2017-08-15 DIAGNOSIS — I69398 Other sequelae of cerebral infarction: Secondary | ICD-10-CM | POA: Diagnosis not present

## 2017-08-15 DIAGNOSIS — G250 Essential tremor: Secondary | ICD-10-CM | POA: Diagnosis not present

## 2017-08-15 DIAGNOSIS — I10 Essential (primary) hypertension: Secondary | ICD-10-CM | POA: Diagnosis not present

## 2017-08-15 DIAGNOSIS — F329 Major depressive disorder, single episode, unspecified: Secondary | ICD-10-CM | POA: Diagnosis not present

## 2017-08-15 DIAGNOSIS — F411 Generalized anxiety disorder: Secondary | ICD-10-CM | POA: Diagnosis not present

## 2017-08-15 NOTE — Telephone Encounter (Signed)
[facility] called and wanted to know if patient could take 25 mg of Benadryl for itching around her eyes and Mucinex DM for cough, congestion and runny nose. Please advise.

## 2017-08-15 NOTE — Telephone Encounter (Signed)
Yes, ok to take both.

## 2017-08-18 DIAGNOSIS — I10 Essential (primary) hypertension: Secondary | ICD-10-CM | POA: Diagnosis not present

## 2017-08-18 DIAGNOSIS — I69398 Other sequelae of cerebral infarction: Secondary | ICD-10-CM | POA: Diagnosis not present

## 2017-08-18 DIAGNOSIS — G250 Essential tremor: Secondary | ICD-10-CM | POA: Diagnosis not present

## 2017-08-18 DIAGNOSIS — M6281 Muscle weakness (generalized): Secondary | ICD-10-CM | POA: Diagnosis not present

## 2017-08-18 DIAGNOSIS — F329 Major depressive disorder, single episode, unspecified: Secondary | ICD-10-CM | POA: Diagnosis not present

## 2017-08-18 DIAGNOSIS — F411 Generalized anxiety disorder: Secondary | ICD-10-CM | POA: Diagnosis not present

## 2017-08-18 NOTE — Telephone Encounter (Signed)
Home Health advised.  

## 2017-08-19 DIAGNOSIS — M6281 Muscle weakness (generalized): Secondary | ICD-10-CM | POA: Diagnosis not present

## 2017-08-19 DIAGNOSIS — I69398 Other sequelae of cerebral infarction: Secondary | ICD-10-CM | POA: Diagnosis not present

## 2017-08-19 DIAGNOSIS — F329 Major depressive disorder, single episode, unspecified: Secondary | ICD-10-CM | POA: Diagnosis not present

## 2017-08-19 DIAGNOSIS — I10 Essential (primary) hypertension: Secondary | ICD-10-CM | POA: Diagnosis not present

## 2017-08-19 DIAGNOSIS — F411 Generalized anxiety disorder: Secondary | ICD-10-CM | POA: Diagnosis not present

## 2017-08-19 DIAGNOSIS — G250 Essential tremor: Secondary | ICD-10-CM | POA: Diagnosis not present

## 2017-08-20 ENCOUNTER — Telehealth: Payer: Self-pay

## 2017-08-20 DIAGNOSIS — F411 Generalized anxiety disorder: Secondary | ICD-10-CM | POA: Diagnosis not present

## 2017-08-20 DIAGNOSIS — M6281 Muscle weakness (generalized): Secondary | ICD-10-CM | POA: Diagnosis not present

## 2017-08-20 DIAGNOSIS — I69398 Other sequelae of cerebral infarction: Secondary | ICD-10-CM | POA: Diagnosis not present

## 2017-08-20 DIAGNOSIS — F329 Major depressive disorder, single episode, unspecified: Secondary | ICD-10-CM | POA: Diagnosis not present

## 2017-08-20 DIAGNOSIS — G250 Essential tremor: Secondary | ICD-10-CM | POA: Diagnosis not present

## 2017-08-20 DIAGNOSIS — I10 Essential (primary) hypertension: Secondary | ICD-10-CM | POA: Diagnosis not present

## 2017-08-20 NOTE — Telephone Encounter (Signed)
Magda Paganini from [facility] called and reports Diclofenac gel has a level 3 interaction with Losartan and a level 2 interaction with Brilinta

## 2017-08-21 DIAGNOSIS — I69398 Other sequelae of cerebral infarction: Secondary | ICD-10-CM | POA: Diagnosis not present

## 2017-08-21 DIAGNOSIS — F411 Generalized anxiety disorder: Secondary | ICD-10-CM | POA: Diagnosis not present

## 2017-08-21 DIAGNOSIS — F329 Major depressive disorder, single episode, unspecified: Secondary | ICD-10-CM | POA: Diagnosis not present

## 2017-08-21 DIAGNOSIS — M6281 Muscle weakness (generalized): Secondary | ICD-10-CM | POA: Diagnosis not present

## 2017-08-21 DIAGNOSIS — I10 Essential (primary) hypertension: Secondary | ICD-10-CM | POA: Diagnosis not present

## 2017-08-21 DIAGNOSIS — G250 Essential tremor: Secondary | ICD-10-CM | POA: Diagnosis not present

## 2017-08-21 NOTE — Telephone Encounter (Signed)
OK to take since topical.

## 2017-08-21 NOTE — Telephone Encounter (Signed)
Magda Paganini advised of recommendation.

## 2017-08-27 DIAGNOSIS — F411 Generalized anxiety disorder: Secondary | ICD-10-CM | POA: Diagnosis not present

## 2017-08-27 DIAGNOSIS — F329 Major depressive disorder, single episode, unspecified: Secondary | ICD-10-CM | POA: Diagnosis not present

## 2017-08-27 DIAGNOSIS — I69398 Other sequelae of cerebral infarction: Secondary | ICD-10-CM | POA: Diagnosis not present

## 2017-08-27 DIAGNOSIS — I10 Essential (primary) hypertension: Secondary | ICD-10-CM | POA: Diagnosis not present

## 2017-08-27 DIAGNOSIS — G250 Essential tremor: Secondary | ICD-10-CM | POA: Diagnosis not present

## 2017-08-27 DIAGNOSIS — M6281 Muscle weakness (generalized): Secondary | ICD-10-CM | POA: Diagnosis not present

## 2017-08-28 DIAGNOSIS — M6281 Muscle weakness (generalized): Secondary | ICD-10-CM | POA: Diagnosis not present

## 2017-08-28 DIAGNOSIS — F329 Major depressive disorder, single episode, unspecified: Secondary | ICD-10-CM | POA: Diagnosis not present

## 2017-08-28 DIAGNOSIS — G250 Essential tremor: Secondary | ICD-10-CM | POA: Diagnosis not present

## 2017-08-28 DIAGNOSIS — F411 Generalized anxiety disorder: Secondary | ICD-10-CM | POA: Diagnosis not present

## 2017-08-28 DIAGNOSIS — I10 Essential (primary) hypertension: Secondary | ICD-10-CM | POA: Diagnosis not present

## 2017-08-28 DIAGNOSIS — I69398 Other sequelae of cerebral infarction: Secondary | ICD-10-CM | POA: Diagnosis not present

## 2017-08-29 DIAGNOSIS — I10 Essential (primary) hypertension: Secondary | ICD-10-CM | POA: Diagnosis not present

## 2017-08-29 DIAGNOSIS — M6281 Muscle weakness (generalized): Secondary | ICD-10-CM | POA: Diagnosis not present

## 2017-08-29 DIAGNOSIS — I69398 Other sequelae of cerebral infarction: Secondary | ICD-10-CM | POA: Diagnosis not present

## 2017-08-29 DIAGNOSIS — F329 Major depressive disorder, single episode, unspecified: Secondary | ICD-10-CM | POA: Diagnosis not present

## 2017-08-29 DIAGNOSIS — F411 Generalized anxiety disorder: Secondary | ICD-10-CM | POA: Diagnosis not present

## 2017-08-29 DIAGNOSIS — G250 Essential tremor: Secondary | ICD-10-CM | POA: Diagnosis not present

## 2017-09-02 DIAGNOSIS — F411 Generalized anxiety disorder: Secondary | ICD-10-CM | POA: Diagnosis not present

## 2017-09-02 DIAGNOSIS — F329 Major depressive disorder, single episode, unspecified: Secondary | ICD-10-CM | POA: Diagnosis not present

## 2017-09-02 DIAGNOSIS — G250 Essential tremor: Secondary | ICD-10-CM | POA: Diagnosis not present

## 2017-09-02 DIAGNOSIS — I10 Essential (primary) hypertension: Secondary | ICD-10-CM | POA: Diagnosis not present

## 2017-09-02 DIAGNOSIS — I69398 Other sequelae of cerebral infarction: Secondary | ICD-10-CM | POA: Diagnosis not present

## 2017-09-02 DIAGNOSIS — M6281 Muscle weakness (generalized): Secondary | ICD-10-CM | POA: Diagnosis not present

## 2017-09-03 ENCOUNTER — Telehealth (HOSPITAL_COMMUNITY): Payer: Self-pay

## 2017-09-03 DIAGNOSIS — F411 Generalized anxiety disorder: Secondary | ICD-10-CM | POA: Diagnosis not present

## 2017-09-03 DIAGNOSIS — M6281 Muscle weakness (generalized): Secondary | ICD-10-CM | POA: Diagnosis not present

## 2017-09-03 DIAGNOSIS — I69398 Other sequelae of cerebral infarction: Secondary | ICD-10-CM | POA: Diagnosis not present

## 2017-09-03 DIAGNOSIS — I10 Essential (primary) hypertension: Secondary | ICD-10-CM | POA: Diagnosis not present

## 2017-09-03 DIAGNOSIS — G250 Essential tremor: Secondary | ICD-10-CM | POA: Diagnosis not present

## 2017-09-03 DIAGNOSIS — F329 Major depressive disorder, single episode, unspecified: Secondary | ICD-10-CM | POA: Diagnosis not present

## 2017-09-03 NOTE — Telephone Encounter (Signed)
Pt's nurse called wit pt near by wanting to know why Dr. Estanislado Pandy wanted to wait until July 2018 to do angio. She stated that the pt has fallen recently and has some dizziness. She wanted to know what changed bc she was originally scheduled for November. I spoke with Dr. Estanislado Pandy and his change of plan was due to the pt not being symptomatic. If she is now saying that she is symptomatic then we will need to schedule imaging sooner. He informed me that we will go ahead and schedule her.

## 2017-09-04 ENCOUNTER — Telehealth (HOSPITAL_COMMUNITY): Payer: Self-pay

## 2017-09-04 NOTE — Telephone Encounter (Signed)
Called to schedule angio, left message for pt to return call. AW 

## 2017-09-05 ENCOUNTER — Telehealth (HOSPITAL_COMMUNITY): Payer: Self-pay

## 2017-09-05 NOTE — Telephone Encounter (Signed)
Pt called concerned because she was going to run out of Brilinta. She does not want to pay the full amount for her medication because it is too expensive. She said that she talked to her insurance company and they offered to waive some of her fees and she would only have to pay $28. I also sent her a discount card yesterday 12/20 to help with her medication cost. Pt agreed with this plan. She will call if she has any issues. AW

## 2017-09-08 DIAGNOSIS — F329 Major depressive disorder, single episode, unspecified: Secondary | ICD-10-CM | POA: Diagnosis not present

## 2017-09-08 DIAGNOSIS — I69398 Other sequelae of cerebral infarction: Secondary | ICD-10-CM | POA: Diagnosis not present

## 2017-09-08 DIAGNOSIS — M6281 Muscle weakness (generalized): Secondary | ICD-10-CM | POA: Diagnosis not present

## 2017-09-08 DIAGNOSIS — F411 Generalized anxiety disorder: Secondary | ICD-10-CM | POA: Diagnosis not present

## 2017-09-08 DIAGNOSIS — I10 Essential (primary) hypertension: Secondary | ICD-10-CM | POA: Diagnosis not present

## 2017-09-08 DIAGNOSIS — Z8673 Personal history of transient ischemic attack (TIA), and cerebral infarction without residual deficits: Secondary | ICD-10-CM | POA: Diagnosis not present

## 2017-09-08 DIAGNOSIS — G250 Essential tremor: Secondary | ICD-10-CM | POA: Diagnosis not present

## 2017-09-08 DIAGNOSIS — R7301 Impaired fasting glucose: Secondary | ICD-10-CM | POA: Diagnosis not present

## 2017-09-08 DIAGNOSIS — E039 Hypothyroidism, unspecified: Secondary | ICD-10-CM | POA: Diagnosis not present

## 2017-09-08 DIAGNOSIS — Z0000 Encounter for general adult medical examination without abnormal findings: Secondary | ICD-10-CM | POA: Diagnosis not present

## 2017-09-09 DIAGNOSIS — I10 Essential (primary) hypertension: Secondary | ICD-10-CM | POA: Diagnosis not present

## 2017-09-09 DIAGNOSIS — M6281 Muscle weakness (generalized): Secondary | ICD-10-CM | POA: Diagnosis not present

## 2017-09-09 DIAGNOSIS — F411 Generalized anxiety disorder: Secondary | ICD-10-CM | POA: Diagnosis not present

## 2017-09-09 DIAGNOSIS — F329 Major depressive disorder, single episode, unspecified: Secondary | ICD-10-CM | POA: Diagnosis not present

## 2017-09-09 DIAGNOSIS — G250 Essential tremor: Secondary | ICD-10-CM | POA: Diagnosis not present

## 2017-09-09 DIAGNOSIS — I69398 Other sequelae of cerebral infarction: Secondary | ICD-10-CM | POA: Diagnosis not present

## 2017-09-11 DIAGNOSIS — G250 Essential tremor: Secondary | ICD-10-CM | POA: Diagnosis not present

## 2017-09-11 DIAGNOSIS — M6281 Muscle weakness (generalized): Secondary | ICD-10-CM | POA: Diagnosis not present

## 2017-09-11 DIAGNOSIS — F411 Generalized anxiety disorder: Secondary | ICD-10-CM | POA: Diagnosis not present

## 2017-09-11 DIAGNOSIS — I10 Essential (primary) hypertension: Secondary | ICD-10-CM | POA: Diagnosis not present

## 2017-09-11 DIAGNOSIS — I69398 Other sequelae of cerebral infarction: Secondary | ICD-10-CM | POA: Diagnosis not present

## 2017-09-11 DIAGNOSIS — F329 Major depressive disorder, single episode, unspecified: Secondary | ICD-10-CM | POA: Diagnosis not present

## 2017-09-17 DIAGNOSIS — F411 Generalized anxiety disorder: Secondary | ICD-10-CM | POA: Diagnosis not present

## 2017-09-17 DIAGNOSIS — I10 Essential (primary) hypertension: Secondary | ICD-10-CM | POA: Diagnosis not present

## 2017-09-17 DIAGNOSIS — G250 Essential tremor: Secondary | ICD-10-CM | POA: Diagnosis not present

## 2017-09-17 DIAGNOSIS — M6281 Muscle weakness (generalized): Secondary | ICD-10-CM | POA: Diagnosis not present

## 2017-09-17 DIAGNOSIS — I69398 Other sequelae of cerebral infarction: Secondary | ICD-10-CM | POA: Diagnosis not present

## 2017-09-17 DIAGNOSIS — F329 Major depressive disorder, single episode, unspecified: Secondary | ICD-10-CM | POA: Diagnosis not present

## 2017-09-18 DIAGNOSIS — G250 Essential tremor: Secondary | ICD-10-CM | POA: Diagnosis not present

## 2017-09-18 DIAGNOSIS — M6281 Muscle weakness (generalized): Secondary | ICD-10-CM | POA: Diagnosis not present

## 2017-09-18 DIAGNOSIS — F329 Major depressive disorder, single episode, unspecified: Secondary | ICD-10-CM | POA: Diagnosis not present

## 2017-09-18 DIAGNOSIS — F411 Generalized anxiety disorder: Secondary | ICD-10-CM | POA: Diagnosis not present

## 2017-09-18 DIAGNOSIS — I69398 Other sequelae of cerebral infarction: Secondary | ICD-10-CM | POA: Diagnosis not present

## 2017-09-18 DIAGNOSIS — I10 Essential (primary) hypertension: Secondary | ICD-10-CM | POA: Diagnosis not present

## 2017-09-22 DIAGNOSIS — M6281 Muscle weakness (generalized): Secondary | ICD-10-CM | POA: Diagnosis not present

## 2017-09-22 DIAGNOSIS — F329 Major depressive disorder, single episode, unspecified: Secondary | ICD-10-CM | POA: Diagnosis not present

## 2017-09-22 DIAGNOSIS — I69398 Other sequelae of cerebral infarction: Secondary | ICD-10-CM | POA: Diagnosis not present

## 2017-09-22 DIAGNOSIS — I10 Essential (primary) hypertension: Secondary | ICD-10-CM | POA: Diagnosis not present

## 2017-09-22 DIAGNOSIS — F411 Generalized anxiety disorder: Secondary | ICD-10-CM | POA: Diagnosis not present

## 2017-09-22 DIAGNOSIS — G250 Essential tremor: Secondary | ICD-10-CM | POA: Diagnosis not present

## 2017-09-23 ENCOUNTER — Other Ambulatory Visit (HOSPITAL_COMMUNITY): Payer: Self-pay | Admitting: Interventional Radiology

## 2017-09-23 DIAGNOSIS — F329 Major depressive disorder, single episode, unspecified: Secondary | ICD-10-CM | POA: Diagnosis not present

## 2017-09-23 DIAGNOSIS — G250 Essential tremor: Secondary | ICD-10-CM | POA: Diagnosis not present

## 2017-09-23 DIAGNOSIS — I10 Essential (primary) hypertension: Secondary | ICD-10-CM | POA: Diagnosis not present

## 2017-09-23 DIAGNOSIS — M6281 Muscle weakness (generalized): Secondary | ICD-10-CM | POA: Diagnosis not present

## 2017-09-23 DIAGNOSIS — F411 Generalized anxiety disorder: Secondary | ICD-10-CM | POA: Diagnosis not present

## 2017-09-23 DIAGNOSIS — I6322 Cerebral infarction due to unspecified occlusion or stenosis of basilar arteries: Secondary | ICD-10-CM

## 2017-09-23 DIAGNOSIS — I69398 Other sequelae of cerebral infarction: Secondary | ICD-10-CM | POA: Diagnosis not present

## 2017-09-24 ENCOUNTER — Ambulatory Visit (HOSPITAL_COMMUNITY)
Admission: RE | Admit: 2017-09-24 | Discharge: 2017-09-24 | Disposition: A | Discharge status: Home / Self Care | Payer: Medicare Other | Source: Ambulatory Visit | Attending: Interventional Radiology | Admitting: Interventional Radiology

## 2017-09-24 DIAGNOSIS — I6322 Cerebral infarction due to unspecified occlusion or stenosis of basilar arteries: Secondary | ICD-10-CM

## 2017-09-24 DIAGNOSIS — I651 Occlusion and stenosis of basilar artery: Secondary | ICD-10-CM | POA: Diagnosis not present

## 2017-09-24 HISTORY — PX: IR RADIOLOGIST EVAL & MGMT: IMG5224

## 2017-09-25 DIAGNOSIS — G250 Essential tremor: Secondary | ICD-10-CM | POA: Diagnosis not present

## 2017-09-25 DIAGNOSIS — I69398 Other sequelae of cerebral infarction: Secondary | ICD-10-CM | POA: Diagnosis not present

## 2017-09-25 DIAGNOSIS — M6281 Muscle weakness (generalized): Secondary | ICD-10-CM | POA: Diagnosis not present

## 2017-09-25 DIAGNOSIS — F329 Major depressive disorder, single episode, unspecified: Secondary | ICD-10-CM | POA: Diagnosis not present

## 2017-09-25 DIAGNOSIS — F411 Generalized anxiety disorder: Secondary | ICD-10-CM | POA: Diagnosis not present

## 2017-09-25 DIAGNOSIS — I10 Essential (primary) hypertension: Secondary | ICD-10-CM | POA: Diagnosis not present

## 2017-09-26 ENCOUNTER — Encounter (HOSPITAL_COMMUNITY): Payer: Self-pay | Admitting: Interventional Radiology

## 2017-09-26 DIAGNOSIS — I10 Essential (primary) hypertension: Secondary | ICD-10-CM | POA: Diagnosis not present

## 2017-09-26 DIAGNOSIS — G250 Essential tremor: Secondary | ICD-10-CM | POA: Diagnosis not present

## 2017-09-26 DIAGNOSIS — F411 Generalized anxiety disorder: Secondary | ICD-10-CM | POA: Diagnosis not present

## 2017-09-26 DIAGNOSIS — F329 Major depressive disorder, single episode, unspecified: Secondary | ICD-10-CM | POA: Diagnosis not present

## 2017-09-26 DIAGNOSIS — I69398 Other sequelae of cerebral infarction: Secondary | ICD-10-CM | POA: Diagnosis not present

## 2017-09-26 DIAGNOSIS — M6281 Muscle weakness (generalized): Secondary | ICD-10-CM | POA: Diagnosis not present

## 2017-09-30 ENCOUNTER — Telehealth (HOSPITAL_COMMUNITY): Payer: Self-pay

## 2017-09-30 DIAGNOSIS — F411 Generalized anxiety disorder: Secondary | ICD-10-CM | POA: Diagnosis not present

## 2017-09-30 DIAGNOSIS — I10 Essential (primary) hypertension: Secondary | ICD-10-CM | POA: Diagnosis not present

## 2017-09-30 DIAGNOSIS — I69398 Other sequelae of cerebral infarction: Secondary | ICD-10-CM | POA: Diagnosis not present

## 2017-09-30 DIAGNOSIS — G250 Essential tremor: Secondary | ICD-10-CM | POA: Diagnosis not present

## 2017-09-30 DIAGNOSIS — M6281 Muscle weakness (generalized): Secondary | ICD-10-CM | POA: Diagnosis not present

## 2017-09-30 DIAGNOSIS — F329 Major depressive disorder, single episode, unspecified: Secondary | ICD-10-CM | POA: Diagnosis not present

## 2017-09-30 NOTE — Telephone Encounter (Signed)
Returned pt's call, left message for pt to call back. AW

## 2017-10-01 DIAGNOSIS — I69398 Other sequelae of cerebral infarction: Secondary | ICD-10-CM | POA: Diagnosis not present

## 2017-10-01 DIAGNOSIS — M6281 Muscle weakness (generalized): Secondary | ICD-10-CM | POA: Diagnosis not present

## 2017-10-01 DIAGNOSIS — F411 Generalized anxiety disorder: Secondary | ICD-10-CM | POA: Diagnosis not present

## 2017-10-01 DIAGNOSIS — I10 Essential (primary) hypertension: Secondary | ICD-10-CM | POA: Diagnosis not present

## 2017-10-01 DIAGNOSIS — G250 Essential tremor: Secondary | ICD-10-CM | POA: Diagnosis not present

## 2017-10-01 DIAGNOSIS — F329 Major depressive disorder, single episode, unspecified: Secondary | ICD-10-CM | POA: Diagnosis not present

## 2017-10-02 DIAGNOSIS — G250 Essential tremor: Secondary | ICD-10-CM | POA: Diagnosis not present

## 2017-10-02 DIAGNOSIS — M6281 Muscle weakness (generalized): Secondary | ICD-10-CM | POA: Diagnosis not present

## 2017-10-02 DIAGNOSIS — F329 Major depressive disorder, single episode, unspecified: Secondary | ICD-10-CM | POA: Diagnosis not present

## 2017-10-02 DIAGNOSIS — I10 Essential (primary) hypertension: Secondary | ICD-10-CM | POA: Diagnosis not present

## 2017-10-02 DIAGNOSIS — F411 Generalized anxiety disorder: Secondary | ICD-10-CM | POA: Diagnosis not present

## 2017-10-02 DIAGNOSIS — I69398 Other sequelae of cerebral infarction: Secondary | ICD-10-CM | POA: Diagnosis not present

## 2017-10-03 DIAGNOSIS — G250 Essential tremor: Secondary | ICD-10-CM | POA: Diagnosis not present

## 2017-10-03 DIAGNOSIS — M6281 Muscle weakness (generalized): Secondary | ICD-10-CM | POA: Diagnosis not present

## 2017-10-03 DIAGNOSIS — F329 Major depressive disorder, single episode, unspecified: Secondary | ICD-10-CM | POA: Diagnosis not present

## 2017-10-03 DIAGNOSIS — I10 Essential (primary) hypertension: Secondary | ICD-10-CM | POA: Diagnosis not present

## 2017-10-03 DIAGNOSIS — F411 Generalized anxiety disorder: Secondary | ICD-10-CM | POA: Diagnosis not present

## 2017-10-03 DIAGNOSIS — I69398 Other sequelae of cerebral infarction: Secondary | ICD-10-CM | POA: Diagnosis not present

## 2017-10-05 DIAGNOSIS — F329 Major depressive disorder, single episode, unspecified: Secondary | ICD-10-CM | POA: Diagnosis not present

## 2017-10-05 DIAGNOSIS — F411 Generalized anxiety disorder: Secondary | ICD-10-CM | POA: Diagnosis not present

## 2017-10-05 DIAGNOSIS — M6281 Muscle weakness (generalized): Secondary | ICD-10-CM | POA: Diagnosis not present

## 2017-10-05 DIAGNOSIS — I69398 Other sequelae of cerebral infarction: Secondary | ICD-10-CM | POA: Diagnosis not present

## 2017-10-05 DIAGNOSIS — G250 Essential tremor: Secondary | ICD-10-CM | POA: Diagnosis not present

## 2017-10-05 DIAGNOSIS — I10 Essential (primary) hypertension: Secondary | ICD-10-CM | POA: Diagnosis not present

## 2017-10-06 DIAGNOSIS — F411 Generalized anxiety disorder: Secondary | ICD-10-CM | POA: Diagnosis not present

## 2017-10-06 DIAGNOSIS — I69398 Other sequelae of cerebral infarction: Secondary | ICD-10-CM | POA: Diagnosis not present

## 2017-10-06 DIAGNOSIS — G250 Essential tremor: Secondary | ICD-10-CM | POA: Diagnosis not present

## 2017-10-06 DIAGNOSIS — M6281 Muscle weakness (generalized): Secondary | ICD-10-CM | POA: Diagnosis not present

## 2017-10-06 DIAGNOSIS — S00521A Blister (nonthermal) of lip, initial encounter: Secondary | ICD-10-CM | POA: Diagnosis not present

## 2017-10-06 DIAGNOSIS — Z8673 Personal history of transient ischemic attack (TIA), and cerebral infarction without residual deficits: Secondary | ICD-10-CM | POA: Diagnosis not present

## 2017-10-06 DIAGNOSIS — F329 Major depressive disorder, single episode, unspecified: Secondary | ICD-10-CM | POA: Diagnosis not present

## 2017-10-06 DIAGNOSIS — I10 Essential (primary) hypertension: Secondary | ICD-10-CM | POA: Diagnosis not present

## 2017-10-06 DIAGNOSIS — R7301 Impaired fasting glucose: Secondary | ICD-10-CM | POA: Diagnosis not present

## 2017-10-07 DIAGNOSIS — G250 Essential tremor: Secondary | ICD-10-CM | POA: Diagnosis not present

## 2017-10-07 DIAGNOSIS — J45909 Unspecified asthma, uncomplicated: Secondary | ICD-10-CM | POA: Diagnosis not present

## 2017-10-07 DIAGNOSIS — E7800 Pure hypercholesterolemia, unspecified: Secondary | ICD-10-CM | POA: Diagnosis not present

## 2017-10-07 DIAGNOSIS — Z7951 Long term (current) use of inhaled steroids: Secondary | ICD-10-CM | POA: Diagnosis not present

## 2017-10-07 DIAGNOSIS — I2510 Atherosclerotic heart disease of native coronary artery without angina pectoris: Secondary | ICD-10-CM | POA: Diagnosis not present

## 2017-10-07 DIAGNOSIS — Z9181 History of falling: Secondary | ICD-10-CM | POA: Diagnosis not present

## 2017-10-07 DIAGNOSIS — E559 Vitamin D deficiency, unspecified: Secondary | ICD-10-CM | POA: Diagnosis not present

## 2017-10-07 DIAGNOSIS — M6281 Muscle weakness (generalized): Secondary | ICD-10-CM | POA: Diagnosis not present

## 2017-10-07 DIAGNOSIS — F411 Generalized anxiety disorder: Secondary | ICD-10-CM | POA: Diagnosis not present

## 2017-10-07 DIAGNOSIS — Z7982 Long term (current) use of aspirin: Secondary | ICD-10-CM | POA: Diagnosis not present

## 2017-10-07 DIAGNOSIS — R911 Solitary pulmonary nodule: Secondary | ICD-10-CM | POA: Diagnosis not present

## 2017-10-07 DIAGNOSIS — E039 Hypothyroidism, unspecified: Secondary | ICD-10-CM | POA: Diagnosis not present

## 2017-10-07 DIAGNOSIS — F329 Major depressive disorder, single episode, unspecified: Secondary | ICD-10-CM | POA: Diagnosis not present

## 2017-10-07 DIAGNOSIS — G4700 Insomnia, unspecified: Secondary | ICD-10-CM | POA: Diagnosis not present

## 2017-10-07 DIAGNOSIS — M1711 Unilateral primary osteoarthritis, right knee: Secondary | ICD-10-CM | POA: Diagnosis not present

## 2017-10-07 DIAGNOSIS — I10 Essential (primary) hypertension: Secondary | ICD-10-CM | POA: Diagnosis not present

## 2017-10-07 DIAGNOSIS — D510 Vitamin B12 deficiency anemia due to intrinsic factor deficiency: Secondary | ICD-10-CM | POA: Diagnosis not present

## 2017-10-07 DIAGNOSIS — I69398 Other sequelae of cerebral infarction: Secondary | ICD-10-CM | POA: Diagnosis not present

## 2017-10-07 DIAGNOSIS — I6523 Occlusion and stenosis of bilateral carotid arteries: Secondary | ICD-10-CM | POA: Diagnosis not present

## 2017-10-07 DIAGNOSIS — Z87891 Personal history of nicotine dependence: Secondary | ICD-10-CM | POA: Diagnosis not present

## 2017-10-08 DIAGNOSIS — I10 Essential (primary) hypertension: Secondary | ICD-10-CM | POA: Diagnosis not present

## 2017-10-08 DIAGNOSIS — M6281 Muscle weakness (generalized): Secondary | ICD-10-CM | POA: Diagnosis not present

## 2017-10-08 DIAGNOSIS — J45909 Unspecified asthma, uncomplicated: Secondary | ICD-10-CM | POA: Diagnosis not present

## 2017-10-08 DIAGNOSIS — G250 Essential tremor: Secondary | ICD-10-CM | POA: Diagnosis not present

## 2017-10-08 DIAGNOSIS — I69398 Other sequelae of cerebral infarction: Secondary | ICD-10-CM | POA: Diagnosis not present

## 2017-10-08 DIAGNOSIS — I2510 Atherosclerotic heart disease of native coronary artery without angina pectoris: Secondary | ICD-10-CM | POA: Diagnosis not present

## 2017-10-09 DIAGNOSIS — I69398 Other sequelae of cerebral infarction: Secondary | ICD-10-CM | POA: Diagnosis not present

## 2017-10-09 DIAGNOSIS — I10 Essential (primary) hypertension: Secondary | ICD-10-CM | POA: Diagnosis not present

## 2017-10-09 DIAGNOSIS — G250 Essential tremor: Secondary | ICD-10-CM | POA: Diagnosis not present

## 2017-10-09 DIAGNOSIS — J45909 Unspecified asthma, uncomplicated: Secondary | ICD-10-CM | POA: Diagnosis not present

## 2017-10-09 DIAGNOSIS — I2510 Atherosclerotic heart disease of native coronary artery without angina pectoris: Secondary | ICD-10-CM | POA: Diagnosis not present

## 2017-10-09 DIAGNOSIS — M6281 Muscle weakness (generalized): Secondary | ICD-10-CM | POA: Diagnosis not present

## 2017-10-10 DIAGNOSIS — G250 Essential tremor: Secondary | ICD-10-CM | POA: Diagnosis not present

## 2017-10-10 DIAGNOSIS — I10 Essential (primary) hypertension: Secondary | ICD-10-CM | POA: Diagnosis not present

## 2017-10-10 DIAGNOSIS — I2510 Atherosclerotic heart disease of native coronary artery without angina pectoris: Secondary | ICD-10-CM | POA: Diagnosis not present

## 2017-10-10 DIAGNOSIS — I69398 Other sequelae of cerebral infarction: Secondary | ICD-10-CM | POA: Diagnosis not present

## 2017-10-10 DIAGNOSIS — J45909 Unspecified asthma, uncomplicated: Secondary | ICD-10-CM | POA: Diagnosis not present

## 2017-10-10 DIAGNOSIS — M6281 Muscle weakness (generalized): Secondary | ICD-10-CM | POA: Diagnosis not present

## 2017-10-13 DIAGNOSIS — I10 Essential (primary) hypertension: Secondary | ICD-10-CM | POA: Diagnosis not present

## 2017-10-13 DIAGNOSIS — G250 Essential tremor: Secondary | ICD-10-CM | POA: Diagnosis not present

## 2017-10-13 DIAGNOSIS — M6281 Muscle weakness (generalized): Secondary | ICD-10-CM | POA: Diagnosis not present

## 2017-10-13 DIAGNOSIS — I69398 Other sequelae of cerebral infarction: Secondary | ICD-10-CM | POA: Diagnosis not present

## 2017-10-13 DIAGNOSIS — I2510 Atherosclerotic heart disease of native coronary artery without angina pectoris: Secondary | ICD-10-CM | POA: Diagnosis not present

## 2017-10-13 DIAGNOSIS — J45909 Unspecified asthma, uncomplicated: Secondary | ICD-10-CM | POA: Diagnosis not present

## 2017-10-14 DIAGNOSIS — M6281 Muscle weakness (generalized): Secondary | ICD-10-CM | POA: Diagnosis not present

## 2017-10-14 DIAGNOSIS — J45909 Unspecified asthma, uncomplicated: Secondary | ICD-10-CM | POA: Diagnosis not present

## 2017-10-14 DIAGNOSIS — G250 Essential tremor: Secondary | ICD-10-CM | POA: Diagnosis not present

## 2017-10-14 DIAGNOSIS — I69398 Other sequelae of cerebral infarction: Secondary | ICD-10-CM | POA: Diagnosis not present

## 2017-10-14 DIAGNOSIS — I10 Essential (primary) hypertension: Secondary | ICD-10-CM | POA: Diagnosis not present

## 2017-10-14 DIAGNOSIS — I2510 Atherosclerotic heart disease of native coronary artery without angina pectoris: Secondary | ICD-10-CM | POA: Diagnosis not present

## 2017-10-16 DIAGNOSIS — G250 Essential tremor: Secondary | ICD-10-CM | POA: Diagnosis not present

## 2017-10-16 DIAGNOSIS — I69398 Other sequelae of cerebral infarction: Secondary | ICD-10-CM | POA: Diagnosis not present

## 2017-10-16 DIAGNOSIS — I10 Essential (primary) hypertension: Secondary | ICD-10-CM | POA: Diagnosis not present

## 2017-10-16 DIAGNOSIS — M6281 Muscle weakness (generalized): Secondary | ICD-10-CM | POA: Diagnosis not present

## 2017-10-16 DIAGNOSIS — J45909 Unspecified asthma, uncomplicated: Secondary | ICD-10-CM | POA: Diagnosis not present

## 2017-10-16 DIAGNOSIS — I2510 Atherosclerotic heart disease of native coronary artery without angina pectoris: Secondary | ICD-10-CM | POA: Diagnosis not present

## 2017-10-18 DIAGNOSIS — J45909 Unspecified asthma, uncomplicated: Secondary | ICD-10-CM | POA: Diagnosis not present

## 2017-10-18 DIAGNOSIS — I69398 Other sequelae of cerebral infarction: Secondary | ICD-10-CM | POA: Diagnosis not present

## 2017-10-18 DIAGNOSIS — G250 Essential tremor: Secondary | ICD-10-CM | POA: Diagnosis not present

## 2017-10-18 DIAGNOSIS — M6281 Muscle weakness (generalized): Secondary | ICD-10-CM | POA: Diagnosis not present

## 2017-10-18 DIAGNOSIS — I2510 Atherosclerotic heart disease of native coronary artery without angina pectoris: Secondary | ICD-10-CM | POA: Diagnosis not present

## 2017-10-18 DIAGNOSIS — I10 Essential (primary) hypertension: Secondary | ICD-10-CM | POA: Diagnosis not present

## 2017-10-21 DIAGNOSIS — G250 Essential tremor: Secondary | ICD-10-CM | POA: Diagnosis not present

## 2017-10-21 DIAGNOSIS — I10 Essential (primary) hypertension: Secondary | ICD-10-CM | POA: Diagnosis not present

## 2017-10-21 DIAGNOSIS — J45909 Unspecified asthma, uncomplicated: Secondary | ICD-10-CM | POA: Diagnosis not present

## 2017-10-21 DIAGNOSIS — M6281 Muscle weakness (generalized): Secondary | ICD-10-CM | POA: Diagnosis not present

## 2017-10-21 DIAGNOSIS — I69398 Other sequelae of cerebral infarction: Secondary | ICD-10-CM | POA: Diagnosis not present

## 2017-10-21 DIAGNOSIS — I2510 Atherosclerotic heart disease of native coronary artery without angina pectoris: Secondary | ICD-10-CM | POA: Diagnosis not present

## 2017-10-22 DIAGNOSIS — I2510 Atherosclerotic heart disease of native coronary artery without angina pectoris: Secondary | ICD-10-CM | POA: Diagnosis not present

## 2017-10-22 DIAGNOSIS — M6281 Muscle weakness (generalized): Secondary | ICD-10-CM | POA: Diagnosis not present

## 2017-10-22 DIAGNOSIS — J45909 Unspecified asthma, uncomplicated: Secondary | ICD-10-CM | POA: Diagnosis not present

## 2017-10-22 DIAGNOSIS — I69398 Other sequelae of cerebral infarction: Secondary | ICD-10-CM | POA: Diagnosis not present

## 2017-10-22 DIAGNOSIS — I10 Essential (primary) hypertension: Secondary | ICD-10-CM | POA: Diagnosis not present

## 2017-10-22 DIAGNOSIS — G250 Essential tremor: Secondary | ICD-10-CM | POA: Diagnosis not present

## 2017-10-23 ENCOUNTER — Emergency Department (HOSPITAL_COMMUNITY)
Admission: EM | Admit: 2017-10-23 | Discharge: 2017-10-23 | Disposition: A | Discharge status: Home / Self Care | Payer: Medicare Other

## 2017-10-23 NOTE — ED Notes (Signed)
Second call in lobby. No response. 

## 2017-10-23 NOTE — ED Notes (Signed)
Called for triage in lobby. No response.

## 2017-10-23 NOTE — ED Notes (Signed)
Third call in lobby no response.

## 2017-10-24 DIAGNOSIS — G250 Essential tremor: Secondary | ICD-10-CM | POA: Diagnosis not present

## 2017-10-24 DIAGNOSIS — M6281 Muscle weakness (generalized): Secondary | ICD-10-CM | POA: Diagnosis not present

## 2017-10-24 DIAGNOSIS — I2510 Atherosclerotic heart disease of native coronary artery without angina pectoris: Secondary | ICD-10-CM | POA: Diagnosis not present

## 2017-10-24 DIAGNOSIS — I10 Essential (primary) hypertension: Secondary | ICD-10-CM | POA: Diagnosis not present

## 2017-10-24 DIAGNOSIS — I69398 Other sequelae of cerebral infarction: Secondary | ICD-10-CM | POA: Diagnosis not present

## 2017-10-24 DIAGNOSIS — J45909 Unspecified asthma, uncomplicated: Secondary | ICD-10-CM | POA: Diagnosis not present

## 2017-10-28 DIAGNOSIS — M6281 Muscle weakness (generalized): Secondary | ICD-10-CM | POA: Diagnosis not present

## 2017-10-28 DIAGNOSIS — J45909 Unspecified asthma, uncomplicated: Secondary | ICD-10-CM | POA: Diagnosis not present

## 2017-10-28 DIAGNOSIS — I2510 Atherosclerotic heart disease of native coronary artery without angina pectoris: Secondary | ICD-10-CM | POA: Diagnosis not present

## 2017-10-28 DIAGNOSIS — I10 Essential (primary) hypertension: Secondary | ICD-10-CM | POA: Diagnosis not present

## 2017-10-28 DIAGNOSIS — G250 Essential tremor: Secondary | ICD-10-CM | POA: Diagnosis not present

## 2017-10-28 DIAGNOSIS — I69398 Other sequelae of cerebral infarction: Secondary | ICD-10-CM | POA: Diagnosis not present

## 2017-10-29 DIAGNOSIS — G250 Essential tremor: Secondary | ICD-10-CM | POA: Diagnosis not present

## 2017-10-29 DIAGNOSIS — I2510 Atherosclerotic heart disease of native coronary artery without angina pectoris: Secondary | ICD-10-CM | POA: Diagnosis not present

## 2017-10-29 DIAGNOSIS — I69398 Other sequelae of cerebral infarction: Secondary | ICD-10-CM | POA: Diagnosis not present

## 2017-10-29 DIAGNOSIS — I10 Essential (primary) hypertension: Secondary | ICD-10-CM | POA: Diagnosis not present

## 2017-10-29 DIAGNOSIS — J45909 Unspecified asthma, uncomplicated: Secondary | ICD-10-CM | POA: Diagnosis not present

## 2017-10-29 DIAGNOSIS — M6281 Muscle weakness (generalized): Secondary | ICD-10-CM | POA: Diagnosis not present

## 2017-10-31 DIAGNOSIS — J45909 Unspecified asthma, uncomplicated: Secondary | ICD-10-CM | POA: Diagnosis not present

## 2017-10-31 DIAGNOSIS — M6281 Muscle weakness (generalized): Secondary | ICD-10-CM | POA: Diagnosis not present

## 2017-10-31 DIAGNOSIS — I10 Essential (primary) hypertension: Secondary | ICD-10-CM | POA: Diagnosis not present

## 2017-10-31 DIAGNOSIS — I69398 Other sequelae of cerebral infarction: Secondary | ICD-10-CM | POA: Diagnosis not present

## 2017-10-31 DIAGNOSIS — G250 Essential tremor: Secondary | ICD-10-CM | POA: Diagnosis not present

## 2017-10-31 DIAGNOSIS — I2510 Atherosclerotic heart disease of native coronary artery without angina pectoris: Secondary | ICD-10-CM | POA: Diagnosis not present

## 2017-11-03 DIAGNOSIS — K5909 Other constipation: Secondary | ICD-10-CM | POA: Diagnosis not present

## 2017-11-03 DIAGNOSIS — Z9181 History of falling: Secondary | ICD-10-CM | POA: Diagnosis not present

## 2017-11-03 DIAGNOSIS — I651 Occlusion and stenosis of basilar artery: Secondary | ICD-10-CM | POA: Diagnosis not present

## 2017-11-03 DIAGNOSIS — J209 Acute bronchitis, unspecified: Secondary | ICD-10-CM | POA: Diagnosis not present

## 2017-11-03 DIAGNOSIS — R42 Dizziness and giddiness: Secondary | ICD-10-CM | POA: Diagnosis not present

## 2017-11-03 DIAGNOSIS — H539 Unspecified visual disturbance: Secondary | ICD-10-CM | POA: Diagnosis not present

## 2017-11-03 DIAGNOSIS — Z8673 Personal history of transient ischemic attack (TIA), and cerebral infarction without residual deficits: Secondary | ICD-10-CM | POA: Diagnosis not present

## 2017-11-03 DIAGNOSIS — R110 Nausea: Secondary | ICD-10-CM | POA: Diagnosis not present

## 2017-11-04 DIAGNOSIS — G250 Essential tremor: Secondary | ICD-10-CM | POA: Diagnosis not present

## 2017-11-04 DIAGNOSIS — I69398 Other sequelae of cerebral infarction: Secondary | ICD-10-CM | POA: Diagnosis not present

## 2017-11-04 DIAGNOSIS — M6281 Muscle weakness (generalized): Secondary | ICD-10-CM | POA: Diagnosis not present

## 2017-11-04 DIAGNOSIS — I10 Essential (primary) hypertension: Secondary | ICD-10-CM | POA: Diagnosis not present

## 2017-11-04 DIAGNOSIS — J45909 Unspecified asthma, uncomplicated: Secondary | ICD-10-CM | POA: Diagnosis not present

## 2017-11-04 DIAGNOSIS — I2510 Atherosclerotic heart disease of native coronary artery without angina pectoris: Secondary | ICD-10-CM | POA: Diagnosis not present

## 2017-11-05 DIAGNOSIS — J45909 Unspecified asthma, uncomplicated: Secondary | ICD-10-CM | POA: Diagnosis not present

## 2017-11-05 DIAGNOSIS — I2510 Atherosclerotic heart disease of native coronary artery without angina pectoris: Secondary | ICD-10-CM | POA: Diagnosis not present

## 2017-11-05 DIAGNOSIS — I69398 Other sequelae of cerebral infarction: Secondary | ICD-10-CM | POA: Diagnosis not present

## 2017-11-05 DIAGNOSIS — G250 Essential tremor: Secondary | ICD-10-CM | POA: Diagnosis not present

## 2017-11-05 DIAGNOSIS — M6281 Muscle weakness (generalized): Secondary | ICD-10-CM | POA: Diagnosis not present

## 2017-11-05 DIAGNOSIS — I10 Essential (primary) hypertension: Secondary | ICD-10-CM | POA: Diagnosis not present

## 2017-11-07 DIAGNOSIS — J45909 Unspecified asthma, uncomplicated: Secondary | ICD-10-CM | POA: Diagnosis not present

## 2017-11-07 DIAGNOSIS — I69398 Other sequelae of cerebral infarction: Secondary | ICD-10-CM | POA: Diagnosis not present

## 2017-11-07 DIAGNOSIS — I2510 Atherosclerotic heart disease of native coronary artery without angina pectoris: Secondary | ICD-10-CM | POA: Diagnosis not present

## 2017-11-07 DIAGNOSIS — M6281 Muscle weakness (generalized): Secondary | ICD-10-CM | POA: Diagnosis not present

## 2017-11-07 DIAGNOSIS — I10 Essential (primary) hypertension: Secondary | ICD-10-CM | POA: Diagnosis not present

## 2017-11-07 DIAGNOSIS — G250 Essential tremor: Secondary | ICD-10-CM | POA: Diagnosis not present

## 2017-11-10 ENCOUNTER — Telehealth (HOSPITAL_COMMUNITY): Payer: Self-pay

## 2017-11-10 DIAGNOSIS — I10 Essential (primary) hypertension: Secondary | ICD-10-CM | POA: Diagnosis not present

## 2017-11-10 DIAGNOSIS — G250 Essential tremor: Secondary | ICD-10-CM | POA: Diagnosis not present

## 2017-11-10 DIAGNOSIS — I2510 Atherosclerotic heart disease of native coronary artery without angina pectoris: Secondary | ICD-10-CM | POA: Diagnosis not present

## 2017-11-10 DIAGNOSIS — J45909 Unspecified asthma, uncomplicated: Secondary | ICD-10-CM | POA: Diagnosis not present

## 2017-11-10 DIAGNOSIS — M6281 Muscle weakness (generalized): Secondary | ICD-10-CM | POA: Diagnosis not present

## 2017-11-10 DIAGNOSIS — I69398 Other sequelae of cerebral infarction: Secondary | ICD-10-CM | POA: Diagnosis not present

## 2017-11-10 NOTE — Telephone Encounter (Signed)
Returned pt's call, left message. AW

## 2017-11-11 DIAGNOSIS — M6281 Muscle weakness (generalized): Secondary | ICD-10-CM | POA: Diagnosis not present

## 2017-11-11 DIAGNOSIS — I10 Essential (primary) hypertension: Secondary | ICD-10-CM | POA: Diagnosis not present

## 2017-11-11 DIAGNOSIS — I2510 Atherosclerotic heart disease of native coronary artery without angina pectoris: Secondary | ICD-10-CM | POA: Diagnosis not present

## 2017-11-11 DIAGNOSIS — I69398 Other sequelae of cerebral infarction: Secondary | ICD-10-CM | POA: Diagnosis not present

## 2017-11-11 DIAGNOSIS — G250 Essential tremor: Secondary | ICD-10-CM | POA: Diagnosis not present

## 2017-11-11 DIAGNOSIS — J45909 Unspecified asthma, uncomplicated: Secondary | ICD-10-CM | POA: Diagnosis not present

## 2017-11-12 DIAGNOSIS — M6281 Muscle weakness (generalized): Secondary | ICD-10-CM | POA: Diagnosis not present

## 2017-11-12 DIAGNOSIS — I10 Essential (primary) hypertension: Secondary | ICD-10-CM | POA: Diagnosis not present

## 2017-11-12 DIAGNOSIS — I2510 Atherosclerotic heart disease of native coronary artery without angina pectoris: Secondary | ICD-10-CM | POA: Diagnosis not present

## 2017-11-12 DIAGNOSIS — G250 Essential tremor: Secondary | ICD-10-CM | POA: Diagnosis not present

## 2017-11-12 DIAGNOSIS — I69398 Other sequelae of cerebral infarction: Secondary | ICD-10-CM | POA: Diagnosis not present

## 2017-11-12 DIAGNOSIS — J45909 Unspecified asthma, uncomplicated: Secondary | ICD-10-CM | POA: Diagnosis not present

## 2017-11-13 DIAGNOSIS — M6281 Muscle weakness (generalized): Secondary | ICD-10-CM | POA: Diagnosis not present

## 2017-11-19 DIAGNOSIS — I69398 Other sequelae of cerebral infarction: Secondary | ICD-10-CM | POA: Diagnosis not present

## 2017-11-19 DIAGNOSIS — J45909 Unspecified asthma, uncomplicated: Secondary | ICD-10-CM | POA: Diagnosis not present

## 2017-11-19 DIAGNOSIS — I10 Essential (primary) hypertension: Secondary | ICD-10-CM | POA: Diagnosis not present

## 2017-11-19 DIAGNOSIS — M6281 Muscle weakness (generalized): Secondary | ICD-10-CM | POA: Diagnosis not present

## 2017-11-19 DIAGNOSIS — I2510 Atherosclerotic heart disease of native coronary artery without angina pectoris: Secondary | ICD-10-CM | POA: Diagnosis not present

## 2017-11-19 DIAGNOSIS — G250 Essential tremor: Secondary | ICD-10-CM | POA: Diagnosis not present

## 2017-11-20 DIAGNOSIS — J209 Acute bronchitis, unspecified: Secondary | ICD-10-CM | POA: Diagnosis not present

## 2017-11-20 DIAGNOSIS — F411 Generalized anxiety disorder: Secondary | ICD-10-CM | POA: Diagnosis not present

## 2017-11-20 DIAGNOSIS — I10 Essential (primary) hypertension: Secondary | ICD-10-CM | POA: Diagnosis not present

## 2017-11-20 DIAGNOSIS — I6523 Occlusion and stenosis of bilateral carotid arteries: Secondary | ICD-10-CM | POA: Diagnosis not present

## 2017-11-21 DIAGNOSIS — M6281 Muscle weakness (generalized): Secondary | ICD-10-CM | POA: Diagnosis not present

## 2017-11-21 DIAGNOSIS — G250 Essential tremor: Secondary | ICD-10-CM | POA: Diagnosis not present

## 2017-11-21 DIAGNOSIS — J45909 Unspecified asthma, uncomplicated: Secondary | ICD-10-CM | POA: Diagnosis not present

## 2017-11-21 DIAGNOSIS — I69398 Other sequelae of cerebral infarction: Secondary | ICD-10-CM | POA: Diagnosis not present

## 2017-11-21 DIAGNOSIS — I10 Essential (primary) hypertension: Secondary | ICD-10-CM | POA: Diagnosis not present

## 2017-11-21 DIAGNOSIS — I2510 Atherosclerotic heart disease of native coronary artery without angina pectoris: Secondary | ICD-10-CM | POA: Diagnosis not present

## 2017-11-26 DIAGNOSIS — G250 Essential tremor: Secondary | ICD-10-CM | POA: Diagnosis not present

## 2017-11-26 DIAGNOSIS — I10 Essential (primary) hypertension: Secondary | ICD-10-CM | POA: Diagnosis not present

## 2017-11-26 DIAGNOSIS — I69398 Other sequelae of cerebral infarction: Secondary | ICD-10-CM | POA: Diagnosis not present

## 2017-11-26 DIAGNOSIS — I2510 Atherosclerotic heart disease of native coronary artery without angina pectoris: Secondary | ICD-10-CM | POA: Diagnosis not present

## 2017-11-26 DIAGNOSIS — J45909 Unspecified asthma, uncomplicated: Secondary | ICD-10-CM | POA: Diagnosis not present

## 2017-11-26 DIAGNOSIS — M6281 Muscle weakness (generalized): Secondary | ICD-10-CM | POA: Diagnosis not present

## 2017-11-29 DIAGNOSIS — I10 Essential (primary) hypertension: Secondary | ICD-10-CM | POA: Diagnosis not present

## 2017-11-29 DIAGNOSIS — M6281 Muscle weakness (generalized): Secondary | ICD-10-CM | POA: Diagnosis not present

## 2017-11-29 DIAGNOSIS — I2510 Atherosclerotic heart disease of native coronary artery without angina pectoris: Secondary | ICD-10-CM | POA: Diagnosis not present

## 2017-11-29 DIAGNOSIS — G250 Essential tremor: Secondary | ICD-10-CM | POA: Diagnosis not present

## 2017-11-29 DIAGNOSIS — J45909 Unspecified asthma, uncomplicated: Secondary | ICD-10-CM | POA: Diagnosis not present

## 2017-11-29 DIAGNOSIS — I69398 Other sequelae of cerebral infarction: Secondary | ICD-10-CM | POA: Diagnosis not present

## 2017-12-01 DIAGNOSIS — I10 Essential (primary) hypertension: Secondary | ICD-10-CM | POA: Diagnosis not present

## 2017-12-01 DIAGNOSIS — M6281 Muscle weakness (generalized): Secondary | ICD-10-CM | POA: Diagnosis not present

## 2017-12-01 DIAGNOSIS — G250 Essential tremor: Secondary | ICD-10-CM | POA: Diagnosis not present

## 2017-12-01 DIAGNOSIS — I69398 Other sequelae of cerebral infarction: Secondary | ICD-10-CM | POA: Diagnosis not present

## 2017-12-01 DIAGNOSIS — I2510 Atherosclerotic heart disease of native coronary artery without angina pectoris: Secondary | ICD-10-CM | POA: Diagnosis not present

## 2017-12-01 DIAGNOSIS — J45909 Unspecified asthma, uncomplicated: Secondary | ICD-10-CM | POA: Diagnosis not present

## 2017-12-03 ENCOUNTER — Emergency Department (HOSPITAL_BASED_OUTPATIENT_CLINIC_OR_DEPARTMENT_OTHER)
Admission: EM | Admit: 2017-12-03 | Discharge: 2017-12-03 | Disposition: A | Discharge status: Home / Self Care | Payer: Medicare Other | Attending: Emergency Medicine | Admitting: Emergency Medicine

## 2017-12-03 ENCOUNTER — Emergency Department (HOSPITAL_BASED_OUTPATIENT_CLINIC_OR_DEPARTMENT_OTHER): Payer: Medicare Other

## 2017-12-03 ENCOUNTER — Encounter (HOSPITAL_BASED_OUTPATIENT_CLINIC_OR_DEPARTMENT_OTHER): Payer: Self-pay | Admitting: *Deleted

## 2017-12-03 DIAGNOSIS — E079 Disorder of thyroid, unspecified: Secondary | ICD-10-CM | POA: Insufficient documentation

## 2017-12-03 DIAGNOSIS — Z87891 Personal history of nicotine dependence: Secondary | ICD-10-CM | POA: Diagnosis not present

## 2017-12-03 DIAGNOSIS — G250 Essential tremor: Secondary | ICD-10-CM | POA: Diagnosis not present

## 2017-12-03 DIAGNOSIS — W228XXA Striking against or struck by other objects, initial encounter: Secondary | ICD-10-CM | POA: Insufficient documentation

## 2017-12-03 DIAGNOSIS — Z7982 Long term (current) use of aspirin: Secondary | ICD-10-CM | POA: Diagnosis not present

## 2017-12-03 DIAGNOSIS — R404 Transient alteration of awareness: Secondary | ICD-10-CM | POA: Diagnosis not present

## 2017-12-03 DIAGNOSIS — Y998 Other external cause status: Secondary | ICD-10-CM | POA: Diagnosis not present

## 2017-12-03 DIAGNOSIS — S0990XA Unspecified injury of head, initial encounter: Secondary | ICD-10-CM | POA: Insufficient documentation

## 2017-12-03 DIAGNOSIS — Z79899 Other long term (current) drug therapy: Secondary | ICD-10-CM | POA: Diagnosis not present

## 2017-12-03 DIAGNOSIS — M6281 Muscle weakness (generalized): Secondary | ICD-10-CM | POA: Diagnosis not present

## 2017-12-03 DIAGNOSIS — S0003XA Contusion of scalp, initial encounter: Secondary | ICD-10-CM | POA: Insufficient documentation

## 2017-12-03 DIAGNOSIS — J45909 Unspecified asthma, uncomplicated: Secondary | ICD-10-CM | POA: Insufficient documentation

## 2017-12-03 DIAGNOSIS — Y92010 Kitchen of single-family (private) house as the place of occurrence of the external cause: Secondary | ICD-10-CM | POA: Insufficient documentation

## 2017-12-03 DIAGNOSIS — I2510 Atherosclerotic heart disease of native coronary artery without angina pectoris: Secondary | ICD-10-CM | POA: Diagnosis not present

## 2017-12-03 DIAGNOSIS — I10 Essential (primary) hypertension: Secondary | ICD-10-CM | POA: Insufficient documentation

## 2017-12-03 DIAGNOSIS — Y9389 Activity, other specified: Secondary | ICD-10-CM | POA: Diagnosis not present

## 2017-12-03 DIAGNOSIS — I69398 Other sequelae of cerebral infarction: Secondary | ICD-10-CM | POA: Diagnosis not present

## 2017-12-03 DIAGNOSIS — R531 Weakness: Secondary | ICD-10-CM | POA: Diagnosis not present

## 2017-12-03 DIAGNOSIS — Z8673 Personal history of transient ischemic attack (TIA), and cerebral infarction without residual deficits: Secondary | ICD-10-CM | POA: Diagnosis not present

## 2017-12-03 NOTE — ED Triage Notes (Signed)
Pt reports that she was bending over to put groceries in the fridge and she hit her head on the freezer door. She applied ice to the area. She denies LOC, or any associated symptoms. Pain 1/10 in her head. No meds for pain PTA.

## 2017-12-03 NOTE — ED Provider Notes (Signed)
[facility] DE[facility] Provider Note: Georgena Spurling, MD, FACEP  CSN: [id number] MRN: [id number] ARRIVAL: 12/03/17 at [place]: [place]  Head Injury   HISTORY OF PRESENT ILLNESS  12/03/17 4:13 AM Kathleen Dunn is a 77 y.o. female who leaned over to put groceries in her refrigerator and hit the top of her head on the freezer door.  There was no loss of consciousness or subsequent vomiting.  She rates associated pain as a 1 out of 10.  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  A neurointerventional radiologist was able to open the occluded artery.   Past Medical History:  Diagnosis Date  . Asthma   . Hypertension   . Lung nodule < 6cm on CT   . Stroke ([place])   . Thyroid disease     Past Surgical History:  Procedure Laterality Date  . ABDOMINAL HYSTERECTOMY  age 36   oophorectomy  . BREAST SURGERY     benign bx of left breast  . IR ANGIO INTRA EXTRACRAN SEL COM CAROTID INNOMINATE BILAT MOD SED  03/20/2017  . IR ANGIO VERTEBRAL SEL SUBCLAVIAN INNOMINATE UNI R MOD SED  03/20/2017  . IR ANGIO VERTEBRAL SEL VERTEBRAL UNI L MOD SED  03/20/2017  . IR PTA INTRACRANIAL  03/26/2017  . IR RADIOLOGIST EVAL & MGMT  04/14/2017  . IR RADIOLOGIST EVAL & MGMT  09/24/2017  . RADIOLOGY WITH ANESTHESIA N/A 03/26/2017   Procedure: RADIOLOGY WITH ANESTHESIA STENTING;  Surgeon: [name], MD;  Location: [facility];  Service: Radiology;  Laterality: N/A;  . VENTRICULOPERITONEAL SHUNT      Family History  Problem Relation Age of Onset  . Heart attack Father     Social History   Tobacco Use  . Smoking status: Former Smoker    Last attempt to quit: 09/17/1971    Years since quitting: 46.2  . Smokeless tobacco: Never Used  Substance Use Topics  . Alcohol use: No  . Drug use: No    Prior to Admission medications   Medication Sig Start Date End Date Taking? Authorizing Provider  acetaminophen  (TYLENOL) 500 MG tablet Take 250-500 mg by mouth every 6 (six) hours as needed for headache (pain).    [provider]  acetic acid-hydrocortisone (VOSOL-HC) otic solution Place 3 drops into both ears 3 (three) times daily. Patient taking differently: Place 3 drops into both ears 3 (three) times daily as needed (itching).  06/21/16   Hali Marry, MD  aspirin EC 81 MG EC tablet Take 1 tablet (81 mg total) by mouth daily. 04/02/17   Mariel Aloe, MD  benzonatate (TESSALON) 100 MG capsule Take 100 mg by mouth 3 (three) times daily as needed for cough.    [provider]  Betamethasone Valerate 0.12 % foam Apply 1 application topically daily as needed (scalp itching). 05/06/17   Hali Marry, MD  Cholecalciferol (VITAMIN D3) 5000 units CAPS Take 1 capsule by mouth daily.     [provider]  clonazePAM (KLONOPIN) 1 MG tablet Take 1 tablet (1 mg total) by mouth at bedtime as needed (anxiety, insomnia). Patient taking differently: Take 1 mg 2 (two) times daily as needed by mouth (anxiety, insomnia).  05/06/17   Hali Marry, MD  diclofenac sodium (VOLTAREN) 1 % GEL Apply 4 g 4 (four) times daily topically. 08/01/17   Hali Marry, MD  diphenhydrAMINE (BENADRYL)  25 mg capsule Take 25 mg daily as needed by mouth for allergies.    [provider]  docusate sodium (COLACE) 100 MG capsule Take 1 capsule (100 mg total) by mouth 2 (two) times daily as needed for mild constipation. 06/27/17   Hali Marry, MD  doxycycline (ADOXA) 50 MG tablet Take 50 mg by mouth 2 (two) times daily.    [provider]  [facility] SEAL PO Take 1 capsule by mouth 2 (two) times daily as needed (congestion).    [provider]  ezetimibe (ZETIA) 10 MG tablet Take 1 tablet (10 mg total) by mouth daily. 05/23/17   Hali Marry, MD  fluticasone (FLONASE) 50 MCG/ACT nasal spray Place 2 sprays into both nostrils daily. Patient  taking differently: Place 2 sprays daily as needed into both nostrils for allergies.  07/08/17   Hali Marry, MD  ketoconazole (NIZORAL) 2 % shampoo Use to shampoo hair up to twice a week as needed for itching 07/09/17   Hali Marry, MD  levothyroxine (SYNTHROID, LEVOTHROID) 50 MCG tablet Take 1 tablet (50 mcg total) by mouth daily. NEED FOLLOW UP VISIT FOR MORE REFILLS 08/06/16   Hali Marry, MD  losartan (COZAAR) 50 MG tablet Take 1 tablet (50 mg total) by mouth daily. 05/23/17   Hali Marry, MD  metoprolol tartrate (LOPRESSOR) 25 MG tablet Take 1 tablet (25 mg total) by mouth daily. Patient taking differently: Take 12.5 mg daily as needed by mouth.  11/03/15   Hali Marry, MD  Miconazole Nitrate 2 % POWD Apply 1 application topically daily as needed (skin irritations).     [provider]  Omega-3 Fatty Acids (FISH OIL) 1000 MG CAPS Take 1 capsule daily by mouth.    [provider]  ondansetron (ZOFRAN) 4 MG tablet Take 4 mg by mouth every 8 (eight) hours as needed for nausea or vomiting.    [provider]  pantoprazole (PROTONIX) 40 MG tablet Take 40 mg by mouth at bedtime.  02/17/17   [provider]  QVAR 40 MCG/ACT inhaler USE 2 INHALATIONS TWICE A DAY Patient taking differently: USE 2 INHALATIONS TWICE A DAY AS NEEDED FOR SHORTNESS OF BREATH/WHEEZING 07/22/14   Hali Marry, MD  ranitidine (ZANTAC) 150 MG capsule TAKE 1 CAPSULE TWICE A DAY AS NEEDED FOR HEARTBURN Patient taking differently: Take 150 mg by mouth daily as needed for heartburn.  11/03/15   Hali Marry, MD  rosuvastatin (CRESTOR) 40 MG tablet Take 1 tablet (40 mg total) by mouth daily at 6 PM. 05/23/17   Hali Marry, MD  ticagrelor (BRILINTA) 90 MG TABS tablet Take 1 tablet (90 mg total) by mouth 2 (two) times daily. 05/23/17   Hali Marry, MD  traMADol (ULTRAM) 50 MG tablet Take 1 tablet (50 mg total) every 12  (twelve) hours as needed by mouth (knee pain). 07/31/17   Hali Marry, MD  vitamin B-12 (CYANOCOBALAMIN) 1000 MCG tablet Take 1,000 mcg daily by mouth.    [provider]    Allergies Codeine; Hydrocodone-acetaminophen; Morphine; Lipitor [atorvastatin]; Morphine and related; Penicillins; Albuterol; Erythromycin; Hydroxyzine pamoate; Iodine; Simvastatin; and Sulfonamide derivatives   REVIEW OF SYSTEMS  Negative except as noted here or in the History of Present Illness.   PHYSICAL EXAMINATION  Initial Vital Signs Blood pressure (!) 146/73, pulse 69, resp. rate 18, SpO2 96 %.  Examination General: Well-developed, well-nourished female in no acute distress; appearance consistent with age of  record HENT: normocephalic; small hematoma on crown of head Eyes: pupils equal, round and reactive to light; extraocular muscles intact Neck: supple Heart: regular rate and rhythm Lungs: clear to auscultation bilaterally Abdomen: soft; nondistended; nontender; bowel sounds present Extremities: No deformity; full range of motion; pulses normal Neurologic: Awake, alert and oriented; motor function intact in all extremities and symmetric; no facial droop Skin: Warm and dry Psychiatric: Normal mood and affect   RESULTS  Summary of this visit's results, reviewed by myself:   EKG Interpretation  Date/Time:    Ventricular Rate:    PR Interval:    QRS Duration:   QT Interval:    QTC Calculation:   R Axis:     Text Interpretation:        Laboratory Studies: No results found for this or any previous visit (from the past 24 hour(s)). Imaging Studies: Ct Head Wo Contrast  Result Date: 12/03/2017 CLINICAL DATA:  Head injury. EXAM: CT HEAD WITHOUT CONTRAST TECHNIQUE: Contiguous axial images were obtained from the base of the skull through the vertex without intravenous contrast. COMPARISON:  Head CT 03/15/2017 FINDINGS: Brain: Unchanged atrophy and chronic small vessel ischemia.  Remote bilateral cerebellar infarcts. No intracranial hemorrhage, mass effect, or midline shift. No hydrocephalus. The basilar cisterns are patent. No evidence of territorial infarct or acute ischemia. No extra-axial or intracranial fluid collection. Vascular: Atherosclerosis of skullbase vasculature without hyperdense vessel or abnormal calcification. Skull: No fracture or focal lesion. Sinuses/Orbits: Paranasal sinuses and mastoid air cells are clear. The visualized orbits are unremarkable. Other: None. IMPRESSION: 1.  No acute intracranial abnormality.  No skull fracture. 2. Atrophy and chronic small vessel ischemia. Remote bilateral cerebellar infarcts. Electronically Signed   By: Jeb Levering M.D.   On: 12/03/2017 04:34    ED COURSE  Nursing notes and initial vitals signs, including pulse oximetry, reviewed.  Vitals:   12/03/17 0153  BP: (!) 146/73  Pulse: 69  Resp: 18  TempSrc: Oral  SpO2: 96%    PROCEDURES    ED DIAGNOSES     ICD-10-CM   1. Minor head injury, initial encounter S09.90XA   2. Traumatic hematoma of scalp, initial encounter S00.Volanda Napoleon, MD 12/03/17 [phone]

## 2017-12-03 NOTE — ED Triage Notes (Signed)
Pt arrives via EMS from home. Per their report, the patient leaned over to get something and bumped her the top of her  head on the bottom of the fridge door. No signs of hematoma, change in vision, dizziness, vomiting. VSS en route. Pt is on Brilinta.

## 2017-12-06 DIAGNOSIS — M1711 Unilateral primary osteoarthritis, right knee: Secondary | ICD-10-CM | POA: Diagnosis not present

## 2017-12-06 DIAGNOSIS — I6523 Occlusion and stenosis of bilateral carotid arteries: Secondary | ICD-10-CM | POA: Diagnosis not present

## 2017-12-06 DIAGNOSIS — J45909 Unspecified asthma, uncomplicated: Secondary | ICD-10-CM | POA: Diagnosis not present

## 2017-12-06 DIAGNOSIS — I69398 Other sequelae of cerebral infarction: Secondary | ICD-10-CM | POA: Diagnosis not present

## 2017-12-06 DIAGNOSIS — F329 Major depressive disorder, single episode, unspecified: Secondary | ICD-10-CM | POA: Diagnosis not present

## 2017-12-06 DIAGNOSIS — Z87891 Personal history of nicotine dependence: Secondary | ICD-10-CM | POA: Diagnosis not present

## 2017-12-06 DIAGNOSIS — I10 Essential (primary) hypertension: Secondary | ICD-10-CM | POA: Diagnosis not present

## 2017-12-06 DIAGNOSIS — F411 Generalized anxiety disorder: Secondary | ICD-10-CM | POA: Diagnosis not present

## 2017-12-06 DIAGNOSIS — E559 Vitamin D deficiency, unspecified: Secondary | ICD-10-CM | POA: Diagnosis not present

## 2017-12-06 DIAGNOSIS — Z7951 Long term (current) use of inhaled steroids: Secondary | ICD-10-CM | POA: Diagnosis not present

## 2017-12-06 DIAGNOSIS — Z9181 History of falling: Secondary | ICD-10-CM | POA: Diagnosis not present

## 2017-12-06 DIAGNOSIS — M6281 Muscle weakness (generalized): Secondary | ICD-10-CM | POA: Diagnosis not present

## 2017-12-06 DIAGNOSIS — Z7982 Long term (current) use of aspirin: Secondary | ICD-10-CM | POA: Diagnosis not present

## 2017-12-06 DIAGNOSIS — I2510 Atherosclerotic heart disease of native coronary artery without angina pectoris: Secondary | ICD-10-CM | POA: Diagnosis not present

## 2017-12-06 DIAGNOSIS — G250 Essential tremor: Secondary | ICD-10-CM | POA: Diagnosis not present

## 2017-12-06 DIAGNOSIS — E039 Hypothyroidism, unspecified: Secondary | ICD-10-CM | POA: Diagnosis not present

## 2017-12-06 DIAGNOSIS — R911 Solitary pulmonary nodule: Secondary | ICD-10-CM | POA: Diagnosis not present

## 2017-12-06 DIAGNOSIS — D510 Vitamin B12 deficiency anemia due to intrinsic factor deficiency: Secondary | ICD-10-CM | POA: Diagnosis not present

## 2017-12-06 DIAGNOSIS — G4700 Insomnia, unspecified: Secondary | ICD-10-CM | POA: Diagnosis not present

## 2017-12-06 DIAGNOSIS — E7800 Pure hypercholesterolemia, unspecified: Secondary | ICD-10-CM | POA: Diagnosis not present

## 2017-12-09 ENCOUNTER — Telehealth (HOSPITAL_COMMUNITY): Payer: Self-pay

## 2017-12-09 NOTE — Telephone Encounter (Signed)
Returned pt's call, left vm. AW 

## 2017-12-10 DIAGNOSIS — I2510 Atherosclerotic heart disease of native coronary artery without angina pectoris: Secondary | ICD-10-CM | POA: Diagnosis not present

## 2017-12-10 DIAGNOSIS — M6281 Muscle weakness (generalized): Secondary | ICD-10-CM | POA: Diagnosis not present

## 2017-12-10 DIAGNOSIS — I69398 Other sequelae of cerebral infarction: Secondary | ICD-10-CM | POA: Diagnosis not present

## 2017-12-10 DIAGNOSIS — G250 Essential tremor: Secondary | ICD-10-CM | POA: Diagnosis not present

## 2017-12-10 DIAGNOSIS — I10 Essential (primary) hypertension: Secondary | ICD-10-CM | POA: Diagnosis not present

## 2017-12-10 DIAGNOSIS — J45909 Unspecified asthma, uncomplicated: Secondary | ICD-10-CM | POA: Diagnosis not present

## 2017-12-13 DIAGNOSIS — I10 Essential (primary) hypertension: Secondary | ICD-10-CM | POA: Diagnosis not present

## 2017-12-14 DIAGNOSIS — I69398 Other sequelae of cerebral infarction: Secondary | ICD-10-CM | POA: Diagnosis not present

## 2017-12-15 DIAGNOSIS — I2510 Atherosclerotic heart disease of native coronary artery without angina pectoris: Secondary | ICD-10-CM | POA: Diagnosis not present

## 2017-12-15 DIAGNOSIS — G250 Essential tremor: Secondary | ICD-10-CM | POA: Diagnosis not present

## 2017-12-15 DIAGNOSIS — J45909 Unspecified asthma, uncomplicated: Secondary | ICD-10-CM | POA: Diagnosis not present

## 2017-12-15 DIAGNOSIS — I69398 Other sequelae of cerebral infarction: Secondary | ICD-10-CM | POA: Diagnosis not present

## 2017-12-15 DIAGNOSIS — I10 Essential (primary) hypertension: Secondary | ICD-10-CM | POA: Diagnosis not present

## 2017-12-15 DIAGNOSIS — M6281 Muscle weakness (generalized): Secondary | ICD-10-CM | POA: Diagnosis not present

## 2017-12-17 DIAGNOSIS — M6281 Muscle weakness (generalized): Secondary | ICD-10-CM | POA: Diagnosis not present

## 2017-12-17 DIAGNOSIS — I69398 Other sequelae of cerebral infarction: Secondary | ICD-10-CM | POA: Diagnosis not present

## 2017-12-17 DIAGNOSIS — G250 Essential tremor: Secondary | ICD-10-CM | POA: Diagnosis not present

## 2017-12-17 DIAGNOSIS — I10 Essential (primary) hypertension: Secondary | ICD-10-CM | POA: Diagnosis not present

## 2017-12-17 DIAGNOSIS — J45909 Unspecified asthma, uncomplicated: Secondary | ICD-10-CM | POA: Diagnosis not present

## 2017-12-17 DIAGNOSIS — I2510 Atherosclerotic heart disease of native coronary artery without angina pectoris: Secondary | ICD-10-CM | POA: Diagnosis not present

## 2017-12-22 DIAGNOSIS — J45909 Unspecified asthma, uncomplicated: Secondary | ICD-10-CM | POA: Diagnosis not present

## 2017-12-22 DIAGNOSIS — G250 Essential tremor: Secondary | ICD-10-CM | POA: Diagnosis not present

## 2017-12-22 DIAGNOSIS — I2510 Atherosclerotic heart disease of native coronary artery without angina pectoris: Secondary | ICD-10-CM | POA: Diagnosis not present

## 2017-12-22 DIAGNOSIS — I69398 Other sequelae of cerebral infarction: Secondary | ICD-10-CM | POA: Diagnosis not present

## 2017-12-22 DIAGNOSIS — I10 Essential (primary) hypertension: Secondary | ICD-10-CM | POA: Diagnosis not present

## 2017-12-22 DIAGNOSIS — M6281 Muscle weakness (generalized): Secondary | ICD-10-CM | POA: Diagnosis not present

## 2017-12-24 ENCOUNTER — Other Ambulatory Visit: Payer: Self-pay | Admitting: Radiology

## 2017-12-25 ENCOUNTER — Other Ambulatory Visit (HOSPITAL_COMMUNITY): Payer: Self-pay | Admitting: Interventional Radiology

## 2017-12-25 ENCOUNTER — Encounter (HOSPITAL_COMMUNITY): Payer: Self-pay

## 2017-12-25 ENCOUNTER — Ambulatory Visit (HOSPITAL_COMMUNITY)
Admission: RE | Admit: 2017-12-25 | Discharge: 2017-12-25 | Disposition: A | Discharge status: Home / Self Care | Payer: Medicare Other | Source: Ambulatory Visit | Attending: Interventional Radiology | Admitting: Interventional Radiology

## 2017-12-25 DIAGNOSIS — I10 Essential (primary) hypertension: Secondary | ICD-10-CM | POA: Diagnosis not present

## 2017-12-25 DIAGNOSIS — I6501 Occlusion and stenosis of right vertebral artery: Secondary | ICD-10-CM | POA: Insufficient documentation

## 2017-12-25 DIAGNOSIS — Z48812 Encounter for surgical aftercare following surgery on the circulatory system: Secondary | ICD-10-CM | POA: Insufficient documentation

## 2017-12-25 DIAGNOSIS — E7800 Pure hypercholesterolemia, unspecified: Secondary | ICD-10-CM | POA: Insufficient documentation

## 2017-12-25 DIAGNOSIS — I6322 Cerebral infarction due to unspecified occlusion or stenosis of basilar arteries: Secondary | ICD-10-CM

## 2017-12-25 DIAGNOSIS — Z882 Allergy status to sulfonamides status: Secondary | ICD-10-CM | POA: Insufficient documentation

## 2017-12-25 DIAGNOSIS — J45909 Unspecified asthma, uncomplicated: Secondary | ICD-10-CM | POA: Insufficient documentation

## 2017-12-25 DIAGNOSIS — Z7982 Long term (current) use of aspirin: Secondary | ICD-10-CM | POA: Diagnosis not present

## 2017-12-25 DIAGNOSIS — E079 Disorder of thyroid, unspecified: Secondary | ICD-10-CM | POA: Insufficient documentation

## 2017-12-25 DIAGNOSIS — Z7902 Long term (current) use of antithrombotics/antiplatelets: Secondary | ICD-10-CM | POA: Diagnosis not present

## 2017-12-25 DIAGNOSIS — Z8673 Personal history of transient ischemic attack (TIA), and cerebral infarction without residual deficits: Secondary | ICD-10-CM | POA: Diagnosis not present

## 2017-12-25 DIAGNOSIS — Z880 Allergy status to penicillin: Secondary | ICD-10-CM | POA: Diagnosis not present

## 2017-12-25 DIAGNOSIS — I6521 Occlusion and stenosis of right carotid artery: Secondary | ICD-10-CM | POA: Insufficient documentation

## 2017-12-25 DIAGNOSIS — I658 Occlusion and stenosis of other precerebral arteries: Secondary | ICD-10-CM | POA: Diagnosis not present

## 2017-12-25 DIAGNOSIS — Z87891 Personal history of nicotine dependence: Secondary | ICD-10-CM | POA: Insufficient documentation

## 2017-12-25 DIAGNOSIS — Z885 Allergy status to narcotic agent status: Secondary | ICD-10-CM | POA: Insufficient documentation

## 2017-12-25 DIAGNOSIS — Z7951 Long term (current) use of inhaled steroids: Secondary | ICD-10-CM | POA: Diagnosis not present

## 2017-12-25 DIAGNOSIS — Z91041 Radiographic dye allergy status: Secondary | ICD-10-CM | POA: Insufficient documentation

## 2017-12-25 DIAGNOSIS — R7303 Prediabetes: Secondary | ICD-10-CM | POA: Diagnosis not present

## 2017-12-25 DIAGNOSIS — I651 Occlusion and stenosis of basilar artery: Secondary | ICD-10-CM | POA: Diagnosis not present

## 2017-12-25 HISTORY — PX: IR ANGIO VERTEBRAL SEL SUBCLAVIAN INNOMINATE UNI R MOD SED: IMG5365

## 2017-12-25 HISTORY — PX: IR ANGIO INTRA EXTRACRAN SEL COM CAROTID INNOMINATE BILAT MOD SED: IMG5360

## 2017-12-25 HISTORY — PX: IR ANGIO VERTEBRAL SEL VERTEBRAL UNI L MOD SED: IMG5367

## 2017-12-25 LAB — BASIC METABOLIC PANEL
ANION GAP: 11 (ref 5–15)
BUN: 16 mg/dL (ref 6–20)
CO2: 25 mmol/L (ref 22–32)
Calcium: 9.1 mg/dL (ref 8.9–10.3)
Chloride: 103 mmol/L (ref 101–111)
Creatinine, Ser: 0.89 mg/dL (ref 0.44–1.00)
GFR calc Af Amer: >60 mL/min (ref >60)
GLUCOSE: 106 mg/dL — AB (ref 65–99)
POTASSIUM: 3.8 mmol/L (ref 3.5–5.1)
Sodium: 139 mmol/L (ref 135–145)

## 2017-12-25 LAB — PROTIME-INR
INR: 0.98
Prothrombin Time: 12.9 seconds (ref 11.4–15.2)

## 2017-12-25 LAB — CBC
HEMATOCRIT: 40.3 % (ref 36.0–46.0)
HEMOGLOBIN: 13.2 g/dL (ref 12.0–15.0)
MCH: 30.4 pg (ref 26.0–34.0)
MCHC: 32.8 g/dL (ref 30.0–36.0)
MCV: 92.9 fL (ref 78.0–100.0)
Platelets: 243 10*3/uL (ref 150–400)
RBC: 4.34 MIL/uL (ref 3.87–5.11)
RDW: 14 % (ref 11.5–15.5)
WBC: 6.9 10*3/uL (ref 4.0–10.5)

## 2017-12-25 MED ORDER — SODIUM CHLORIDE 0.9 % IV SOLN
Freq: Once | INTRAVENOUS | Status: AC
  Administered 2017-12-25: 09:00:00 via INTRAVENOUS

## 2017-12-25 MED ORDER — SODIUM CHLORIDE 0.9 % IV SOLN: INTRAVENOUS | Status: DC

## 2017-12-25 MED ORDER — HEPARIN SODIUM (PORCINE) 1000 UNIT/ML IJ SOLN
INTRAMUSCULAR | Status: AC | PRN
  Administered 2017-12-25: 1000 [IU] via INTRAVENOUS

## 2017-12-25 MED ORDER — LIDOCAINE HCL (PF) 1 % IJ SOLN
INTRAMUSCULAR | Status: AC | PRN
  Administered 2017-12-25: 10 mL

## 2017-12-25 MED ORDER — ACETAMINOPHEN 325 MG PO TABS
ORAL_TABLET | ORAL | Status: AC
  Administered 2017-12-25: 650 mg via ORAL
  Filled 2017-12-25: qty 2

## 2017-12-25 MED ORDER — FENTANYL CITRATE (PF) 100 MCG/2ML IJ SOLN
INTRAMUSCULAR | Status: AC | PRN
  Administered 2017-12-25: 25 ug via INTRAVENOUS

## 2017-12-25 MED ORDER — FENTANYL CITRATE (PF) 100 MCG/2ML IJ SOLN
INTRAMUSCULAR | Status: AC
  Filled 2017-12-25: qty 2

## 2017-12-25 MED ORDER — IOPAMIDOL (ISOVUE-300) INJECTION 61%
INTRAVENOUS | Status: AC
  Administered 2017-12-25: 10 mL
  Filled 2017-12-25: qty 50

## 2017-12-25 MED ORDER — DIPHENHYDRAMINE HCL 50 MG/ML IJ SOLN
50.0000 mg | Freq: Once | INTRAMUSCULAR | Status: AC
  Administered 2017-12-25: 50 mg via INTRAVENOUS

## 2017-12-25 MED ORDER — MIDAZOLAM HCL 2 MG/2ML IJ SOLN
INTRAMUSCULAR | Status: AC
  Filled 2017-12-25: qty 2

## 2017-12-25 MED ORDER — HEPARIN SODIUM (PORCINE) 1000 UNIT/ML IJ SOLN
INTRAMUSCULAR | Status: AC
  Filled 2017-12-25: qty 1

## 2017-12-25 MED ORDER — LIDOCAINE HCL 1 % IJ SOLN
INTRAMUSCULAR | Status: AC
  Filled 2017-12-25: qty 20

## 2017-12-25 MED ORDER — ACETAMINOPHEN 325 MG PO TABS
650.0000 mg | ORAL_TABLET | Freq: Four times a day (QID) | ORAL | Status: DC | PRN
  Administered 2017-12-25: 650 mg via ORAL

## 2017-12-25 MED ORDER — DIPHENHYDRAMINE HCL 50 MG/ML IJ SOLN
INTRAMUSCULAR | Status: AC
  Filled 2017-12-25: qty 1

## 2017-12-25 MED ORDER — IOPAMIDOL (ISOVUE-300) INJECTION 61%
INTRAVENOUS | Status: AC
  Administered 2017-12-25: 75 mL
  Filled 2017-12-25: qty 150

## 2017-12-25 MED ORDER — MIDAZOLAM HCL 2 MG/2ML IJ SOLN
INTRAMUSCULAR | Status: AC | PRN
  Administered 2017-12-25: 1 mg via INTRAVENOUS

## 2017-12-25 NOTE — Procedures (Signed)
S/P 4 vessel cerebral arteriogram. RT CFA approach  Findings. 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

## 2017-12-25 NOTE — Progress Notes (Signed)
Pt dressed but wants to wait a little while longer, laying back down and requesting snacks and drink. Told pt when she is able to get in chair and no longer feeling like she needs to lay down she can have more snacks. Pt is moving properly, just wants to be sure she will be ok to ride.

## 2017-12-25 NOTE — Sedation Documentation (Signed)
Exo-seal placed.

## 2017-12-25 NOTE — Sedation Documentation (Signed)
Patient is resting comfortably. 

## 2017-12-25 NOTE — H&P (Addendum)
Chief Complaint: Patient was seen in consultation today for basilar artery stenosis s/p revascularization.  Referring Physician(s): None  Supervising Physician: [name]  Patient Status: [facility] - Out-pt  History of Present Illness: Kathleen Dunn is a 77 y.o. female  Hx CVA 03/2018, hypertension, pre-diabetes, and high cholesterol. Known to IR, followed by Dr. Estanislado Pandy.  Diagnostic cerebral angiogram 03/20/2017: 1. Approximately 95% plus severe stenosis of the proximal basilar artery, with a more proximal approximately 30-50% stenosis. 2. 50% stenosis at the origin of the right external carotid artery. 3. Approximately 50% stenosis of the right internal carotid artery secondary to a circumferential atherosclerotic plaque. 4. Approximately 50% stenosis of the proximal right anterior cerebral artery.  03/26/2017: severe proximal basial artery stenosis s/p revascularization with angioplasty  Consulted with Dr. Estanislado Pandy 04/14/2017 and 09/24/2017 regarding management of her intracranial stenosis.  Patient presents today for image-guided diagnostic cerebral angiogram with Dr. Estanislado Pandy. Accompanied by daughter at bedside. States that she still has occasional dizziness that is stable at this time. Complains of 1 episode of syncope a few weeks ago. States she was sitting in her chair talking on the phone and passed out. Denies headache, weakness, numbness/tingling, vision changes, hearing loss, tinnitus, and speech difficulty.  Patient is taking Brilinta 90 mg twice daily and Aspirin 81 mg once daily.  Past Medical History:  Diagnosis Date  . Asthma   . Hypertension   . Lung nodule < 6cm on CT   . Stroke ([place])   . Thyroid disease     Past Surgical History:  Procedure Laterality Date  . ABDOMINAL HYSTERECTOMY  age 61   oophorectomy  . BREAST SURGERY     benign bx of left breast  . IR ANGIO INTRA EXTRACRAN SEL COM CAROTID INNOMINATE BILAT MOD SED  03/20/2017  . IR ANGIO  VERTEBRAL SEL SUBCLAVIAN INNOMINATE UNI R MOD SED  03/20/2017  . IR ANGIO VERTEBRAL SEL VERTEBRAL UNI L MOD SED  03/20/2017  . IR PTA INTRACRANIAL  03/26/2017  . IR RADIOLOGIST EVAL & MGMT  04/14/2017  . IR RADIOLOGIST EVAL & MGMT  09/24/2017  . RADIOLOGY WITH ANESTHESIA N/A 03/26/2017   Procedure: RADIOLOGY WITH ANESTHESIA STENTING;  Surgeon: [name], MD;  Location: [facility];  Service: Radiology;  Laterality: N/A;  . VENTRICULOPERITONEAL SHUNT      Allergies: Codeine; Hydrocodone-acetaminophen; Morphine; Lipitor [atorvastatin]; Penicillins; Maxitrol [neomycin-polymyxin-dexameth]; Albuterol; Erythromycin; Hydroxyzine pamoate; Iodine; Simvastatin; and Sulfonamide derivatives  Medications: Prior to Admission medications   Medication Sig Start Date End Date Taking? Authorizing Provider  acetaminophen (TYLENOL) 500 MG tablet Take 500-1,000 mg by mouth every 6 (six) hours as needed for headache (pain).    Yes [provider]  acetic acid-hydrocortisone (VOSOL-HC) otic solution Place 3 drops into both ears 3 (three) times daily. Patient taking differently: Place 3 drops into both ears 3 (three) times daily as needed (itching).  06/21/16  Yes Hali Marry, MD  aspirin EC 81 MG EC tablet Take 1 tablet (81 mg total) by mouth daily. 04/02/17  Yes Mariel Aloe, MD  benzonatate (TESSALON) 100 MG capsule Take 100 mg by mouth 3 (three) times daily as needed for cough.   Yes [provider]  calcium carbonate (TUMS - DOSED IN MG ELEMENTAL CALCIUM) 500 MG chewable tablet Chew 1-2 tablets by mouth daily as needed for indigestion or heartburn.   Yes [provider]  Cholecalciferol (VITAMIN D3) 5000 units CAPS Take 5,000 Units by mouth daily.    Yes [provider]  clonazePAM (KLONOPIN) 1 MG tablet Take 1 tablet (1 mg total) by mouth at bedtime as needed (anxiety, insomnia). Patient taking differently: Take 1 mg 2 (two) times daily as needed by mouth (anxiety,  insomnia).  05/06/17  Yes Hali Marry, MD  Cyanocobalamin (B-12 COMPLIANCE INJECTION IJ) Inject 1,000 Units as directed. Every 1 to 2 weeks   Yes [provider]  DEPO-ESTRADIOL 5 MG/ML injection Inject 2.5 mg as directed. Every 2 to 3 weeks 10/20/17  Yes [provider]  diclofenac sodium (VOLTAREN) 1 % GEL Apply 4 g 4 (four) times daily topically. Patient taking differently: Apply 4 g topically 4 (four) times daily as needed (pain).  08/01/17  Yes Hali Marry, MD  diphenhydrAMINE (BENADRYL) 25 mg capsule Take 25 mg daily as needed by mouth for allergies.   Yes [provider]  ECHINACEA-GOLDEN SEAL PO Take 1 capsule by mouth 2 (two) times daily.    Yes [provider]  ezetimibe (ZETIA) 10 MG tablet Take 1 tablet (10 mg total) by mouth daily. 05/23/17  Yes Hali Marry, MD  fluticasone (FLONASE) 50 MCG/ACT nasal spray Place 2 sprays into both nostrils daily. Patient taking differently: Place 2 sprays daily as needed into both nostrils for allergies.  07/08/17  Yes Hali Marry, MD  guaiFENesin (MUCINEX) 600 MG 12 hr tablet Take 600 mg by mouth 2 (two) times daily as needed for cough or to loosen phlegm.   Yes [provider]  ketoconazole (NIZORAL) 2 % shampoo Use to shampoo hair up to twice a week as needed for itching 07/09/17  Yes Hali Marry, MD  levothyroxine (SYNTHROID, LEVOTHROID) 50 MCG tablet Take 1 tablet (50 mcg total) by mouth daily. NEED FOLLOW UP VISIT FOR MORE REFILLS 08/06/16  Yes Hali Marry, MD  [facility] PAIN RELIEF PATCH EX) Apply 1 patch topically daily as needed (pain).   Yes [provider]  losartan (COZAAR) 50 MG tablet Take 1 tablet (50 mg total) by mouth daily. Patient taking differently: Take 25 mg by mouth daily.  05/23/17  Yes Hali Marry, MD  metoprolol tartrate (LOPRESSOR) 25 MG tablet Take 1 tablet (25 mg total) by mouth daily. Patient taking  differently: Take 12.5 mg by mouth daily as needed (if heart rate goes up).  11/03/15  Yes Hali Marry, MD  Miconazole Nitrate 2 % POWD Apply 1 application topically daily as needed (skin irritations).    Yes [provider]  Omega-3 Fatty Acids (FISH OIL PO) Take 2,080 mg by mouth daily.   Yes [provider]  ondansetron (ZOFRAN) 4 MG tablet Take 4 mg by mouth every 8 (eight) hours as needed for nausea or vomiting.   Yes [provider]  pantoprazole (PROTONIX) 40 MG tablet Take 40 mg by mouth at bedtime.  02/17/17  Yes [provider]  QVAR 40 MCG/ACT inhaler USE 2 INHALATIONS TWICE A DAY Patient taking differently: USE 2 INHALATIONS TWICE A DAY AS NEEDED FOR SHORTNESS OF BREATH/WHEEZING 07/22/14  Yes Hali Marry, MD  rosuvastatin (CRESTOR) 40 MG tablet Take 1 tablet (40 mg total) by mouth daily at 6 PM. 05/23/17  Yes Metheney, Rene Kocher, MD  senna-docusate (SENOKOT-S) 8.6-50 MG tablet Take 1-2 tablets by mouth daily.   Yes [provider]  ticagrelor (BRILINTA) 90 MG TABS tablet Take 1 tablet (90 mg total) by mouth 2 (two) times daily. 05/23/17  Yes Hali Marry, MD  traMADol Veatrice Bourbon)  50 MG tablet Take 1 tablet (50 mg total) every 12 (twelve) hours as needed by mouth (knee pain). Patient taking differently: Take 25-50 mg by mouth daily as needed (knee pain).  07/31/17  Yes Hali Marry, MD  VITAMIN A PO Take 2,400 Units by mouth daily.   Yes [provider]  vitamin C (ASCORBIC ACID) 500 MG tablet Take 500 mg by mouth daily.   Yes [provider]  docusate sodium (COLACE) 100 MG capsule Take 1 capsule (100 mg total) by mouth 2 (two) times daily as needed for mild constipation. Patient not taking: Reported on 12/24/2017 06/27/17   Hali Marry, MD     Family History  Problem Relation Age of Onset  . Heart attack Father     Social History   Socioeconomic History  . Marital status:  Divorced    Spouse name: Not on file  . Number of children: Not on file  . Years of education: Not on file  . Highest education level: Not on file  Occupational History  . Not on file  Social Needs  . Financial resource strain: Not on file  . Food insecurity:    Worry: Not on file    Inability: Not on file  . Transportation needs:    Medical: Not on file    Non-medical: Not on file  Tobacco Use  . Smoking status: Former Smoker    Last attempt to quit: 09/17/1971    Years since quitting: 46.3  . Smokeless tobacco: Never Used  Substance and Sexual Activity  . Alcohol use: No  . Drug use: No  . Sexual activity: Not on file  Lifestyle  . Physical activity:    Days per week: Not on file    Minutes per session: Not on file  . Stress: Not on file  Relationships  . Social connections:    Talks on phone: Not on file    Gets together: Not on file    Attends religious service: Not on file    Active member of club or organization: Not on file    Attends meetings of clubs or organizations: Not on file    Relationship status: Not on file  Other Topics Concern  . Not on file  Social History Narrative  . Not on file     Review of Systems: A 12 point ROS discussed and pertinent positives are indicated in the HPI above.  All other systems are negative.  Review of Systems  Constitutional: Negative for activity change and fever.  HENT: Negative for hearing loss and tinnitus.   Eyes: Negative for visual disturbance.  Respiratory: Negative for shortness of breath and wheezing.   Cardiovascular: Negative for chest pain and palpitations.  Neurological: Positive for dizziness and syncope. Negative for speech difficulty, weakness, numbness and headaches.  Psychiatric/Behavioral: Negative for behavioral problems and confusion.    Vital Signs: BP (!) 132/52 (BP Location: Right Arm)   Pulse (!) 58   Temp 97.7 F (36.5 C) (Oral)   Ht 5' 1.5" (1.562 m)   Wt 168 lb (76.2 kg)   SpO2 97%    BMI 31.23 kg/m   Physical Exam  Constitutional: She is oriented to person, place, and time. She appears well-developed and well-nourished. No distress.  Cardiovascular: Normal rate, regular rhythm and normal heart sounds.  No murmur heard. Pulmonary/Chest: Effort normal and breath sounds normal. She has no wheezes.  Neurological: She is alert and oriented to person, place, and time.  Skin: Skin is warm and dry.  Psychiatric: She has a normal mood and affect. Her behavior is normal. Judgment and thought content normal.  Nursing note and vitals reviewed.    MD Evaluation Airway: WNL Heart: WNL Abdomen: WNL Chest/ Lungs: WNL ASA  Classification: 3 Mallampati/Airway Score: One   Imaging: Ct Head Wo Contrast  Result Date: 12/03/2017 CLINICAL DATA:  Head injury. EXAM: CT HEAD WITHOUT CONTRAST TECHNIQUE: Contiguous axial images were obtained from the base of the skull through the vertex without intravenous contrast. COMPARISON:  Head CT 03/15/2017 FINDINGS: Brain: Unchanged atrophy and chronic small vessel ischemia. Remote bilateral cerebellar infarcts. No intracranial hemorrhage, mass effect, or midline shift. No hydrocephalus. The basilar cisterns are patent. No evidence of territorial infarct or acute ischemia. No extra-axial or intracranial fluid collection. Vascular: Atherosclerosis of skullbase vasculature without hyperdense vessel or abnormal calcification. Skull: No fracture or focal lesion. Sinuses/Orbits: Paranasal sinuses and mastoid air cells are clear. The visualized orbits are unremarkable. Other: None. IMPRESSION: 1.  No acute intracranial abnormality.  No skull fracture. 2. Atrophy and chronic small vessel ischemia. Remote bilateral cerebellar infarcts. Electronically Signed   By: Jeb Levering M.D.   On: 12/03/2017 04:34    Labs:  CBC: Recent Labs    03/21/17 1352 03/22/17 0335 03/26/17 0605 03/27/17 0634  WBC 6.9 8.0 8.6 8.4  HGB 13.0 12.6 12.7 12.2  HCT 38.6  37.8 38.2 37.2  PLT 242 222 243 231    COAGS: Recent Labs    03/17/17 0630 03/20/17 0823 03/26/17 0605  INR 1.09 1.03 1.03  APTT  --   --  35    BMP: Recent Labs    03/21/17 1352 03/22/17 0335 03/26/17 0605 03/27/17 0634  NA 138 137 138 137  K 4.3 4.7 3.7 3.8  CL 105 104 102 104  CO2 25 27 27 24   GLUCOSE 122* 108* 108* 109*  BUN 8 9 11 6   CALCIUM 8.5* 8.7* 8.9 8.7*  CREATININE 0.89 0.90 0.88 0.78  GFRNONAA >60 >60 >60 >60  GFRAA >60 >60 >60 >60    LIVER FUNCTION TESTS: Recent Labs    03/20/17 0823 03/21/17 1352 03/22/17 0335 03/26/17 0605  BILITOT 0.6 1.0 0.9 1.0  AST 19 39 34 32  ALT 17 27 26  43  ALKPHOS 35* 43 39 42  PROT 5.4* 6.0* 5.9* 6.1*  ALBUMIN 3.1* 3.4* 3.2* 3.5    TUMOR MARKERS: No results for input(s): AFPTM, CEA, CA199, CHROMGRNA in the last 8760 hours.  Assessment and Plan:  Basilar artery stenosis s/p revascularization 03/26/2017. Plan for image-guided diagnostic cerebral angiogram today. Patient is NPO.  She denies fever and WBCs WNL. INR 0.98 seconds today. Patient with known iodine allergy with reaction of rash. Benadryl 50 mg IV ordered, to be administered prior to procedure.  Risks and benefits of cerebral angiogram were discussed with the patient including, but not limited to bleeding, infection, vascular injury or contrast induced renal failure. This interventional procedure involves the use of X-rays and because of the nature of the planned procedure, it is possible that we will have prolonged use of X-ray fluoroscopy. Potential radiation risks to you include (but are not limited to) the following: - A slightly elevated risk for cancer  several years later in life. This risk is typically less than 0.5% percent. This risk is low in comparison to the normal incidence of human cancer, which is 33% for women and 50% for men according to the [place]. - Radiation  induced injury can include skin redness, resembling a rash,  tissue breakdown / ulcers and hair loss (which can be temporary or permanent).  The likelihood of either of these occurring depends on the difficulty of the procedure and whether you are sensitive to radiation due to previous procedures, disease, or genetic conditions.  IF your procedure requires a prolonged use of radiation, you will be notified and given written instructions for further action.  It is your responsibility to monitor the irradiated area for the 2 weeks following the procedure and to notify your physician if you are concerned that you have suffered a radiation induced injury.   All of the patient's questions were answered, patient is agreeable to proceed. Consent signed and in chart.  Thank you for this interesting consult.  I greatly enjoyed meeting Kathleen Dunn and look forward to participating in their care.  A copy of this report was sent to the requesting provider on this date.  Electronically Signed: Earley Abide, PA-C 12/25/2017, 9:18 AM   I spent a total of 30 minutes in face to face in clinical consultation, greater than 50% of which was counseling/coordinating care for basilar artery stenosis s/p revascularization.

## 2017-12-25 NOTE — Discharge Instructions (Signed)

## 2017-12-25 NOTE — Progress Notes (Signed)
Pt c/o BP cuff pain and back discomfort and requested tylenol. PA for radiology was contacted and tylenol orders followed.

## 2017-12-25 NOTE — Progress Notes (Signed)
after ambulating pt said she felt a little dizzy, pt was laid down and elevated slowly, pt now eating graham crackers. Will continue to DC  And monitor.

## 2017-12-25 NOTE — Sedation Documentation (Addendum)
Per Dr. Estanislado Pandy, removed [place] and ETCO2 monitoring. Pt now on RA

## 2017-12-26 ENCOUNTER — Encounter (HOSPITAL_COMMUNITY): Payer: Self-pay | Admitting: Interventional Radiology

## 2017-12-26 ENCOUNTER — Telehealth: Payer: Self-pay | Admitting: Radiology

## 2017-12-26 DIAGNOSIS — I69398 Other sequelae of cerebral infarction: Secondary | ICD-10-CM | POA: Diagnosis not present

## 2017-12-26 DIAGNOSIS — G250 Essential tremor: Secondary | ICD-10-CM | POA: Diagnosis not present

## 2017-12-26 DIAGNOSIS — J45909 Unspecified asthma, uncomplicated: Secondary | ICD-10-CM | POA: Diagnosis not present

## 2017-12-26 DIAGNOSIS — I2510 Atherosclerotic heart disease of native coronary artery without angina pectoris: Secondary | ICD-10-CM | POA: Diagnosis not present

## 2017-12-26 DIAGNOSIS — M6281 Muscle weakness (generalized): Secondary | ICD-10-CM | POA: Diagnosis not present

## 2017-12-26 DIAGNOSIS — I10 Essential (primary) hypertension: Secondary | ICD-10-CM | POA: Diagnosis not present

## 2017-12-26 NOTE — Progress Notes (Signed)
   Pt was seen yesterday in IR- Cerebral arteriogram Result pending  Note 12/25/17: Patient presents today for image-guided diagnostic cerebral angiogram with Dr. Estanislado Pandy. Accompanied by daughter at bedside. States that she still has occasional dizziness that is stable at this time. Complains of 1 episode of syncope a few weeks ago. States she was sitting in her chair talking on the phone and passed out. Denies headache, weakness, numbness/tingling, vision changes, hearing loss, tinnitus, and speech difficulty.    Diagnostic cerebral angiogram 03/20/2017: 1. Approximately 95% plus severe stenosis of the proximal basilar artery, with a more proximal approximately 30-50% stenosis. 2. 50% stenosis at the origin of the right external carotid artery. 3. Approximately 50% stenosis of the right internal carotid artery secondary to a circumferential atherosclerotic plaque. 4. Approximately 50% stenosis of the proximal right anterior cerebral artery.  03/26/2017: severe proximal basial artery stenosis s/p revascularization with angioplasty  Today she is complaining that she has had headache off and on since procedure-- none today +Nausea; No vomiting Dizziness-- unchanged Some SOB No visual changes Denies fever   Taking some meds from home for nausea-- no help  Discussed with Dr Estanislado Pandy Rec: resting Nausea meds OTC from pharmacy If symptoms persist-- contact PMD (she is awaiting call beck now) Come to ED for evaluation if feels appropriate  Agreeable to plan

## 2017-12-31 DIAGNOSIS — M6281 Muscle weakness (generalized): Secondary | ICD-10-CM | POA: Diagnosis not present

## 2017-12-31 DIAGNOSIS — I69398 Other sequelae of cerebral infarction: Secondary | ICD-10-CM | POA: Diagnosis not present

## 2017-12-31 DIAGNOSIS — I10 Essential (primary) hypertension: Secondary | ICD-10-CM | POA: Diagnosis not present

## 2017-12-31 DIAGNOSIS — I2510 Atherosclerotic heart disease of native coronary artery without angina pectoris: Secondary | ICD-10-CM | POA: Diagnosis not present

## 2017-12-31 DIAGNOSIS — G250 Essential tremor: Secondary | ICD-10-CM | POA: Diagnosis not present

## 2017-12-31 DIAGNOSIS — J45909 Unspecified asthma, uncomplicated: Secondary | ICD-10-CM | POA: Diagnosis not present

## 2018-01-01 DIAGNOSIS — I2510 Atherosclerotic heart disease of native coronary artery without angina pectoris: Secondary | ICD-10-CM | POA: Diagnosis not present

## 2018-01-01 DIAGNOSIS — I69398 Other sequelae of cerebral infarction: Secondary | ICD-10-CM | POA: Diagnosis not present

## 2018-01-01 DIAGNOSIS — J45909 Unspecified asthma, uncomplicated: Secondary | ICD-10-CM | POA: Diagnosis not present

## 2018-01-01 DIAGNOSIS — M6281 Muscle weakness (generalized): Secondary | ICD-10-CM | POA: Diagnosis not present

## 2018-01-01 DIAGNOSIS — G250 Essential tremor: Secondary | ICD-10-CM | POA: Diagnosis not present

## 2018-01-01 DIAGNOSIS — I10 Essential (primary) hypertension: Secondary | ICD-10-CM | POA: Diagnosis not present

## 2018-01-02 DIAGNOSIS — M6281 Muscle weakness (generalized): Secondary | ICD-10-CM | POA: Diagnosis not present

## 2018-01-02 DIAGNOSIS — G250 Essential tremor: Secondary | ICD-10-CM | POA: Diagnosis not present

## 2018-01-02 DIAGNOSIS — I2510 Atherosclerotic heart disease of native coronary artery without angina pectoris: Secondary | ICD-10-CM | POA: Diagnosis not present

## 2018-01-02 DIAGNOSIS — I69398 Other sequelae of cerebral infarction: Secondary | ICD-10-CM | POA: Diagnosis not present

## 2018-01-02 DIAGNOSIS — I10 Essential (primary) hypertension: Secondary | ICD-10-CM | POA: Diagnosis not present

## 2018-01-02 DIAGNOSIS — J45909 Unspecified asthma, uncomplicated: Secondary | ICD-10-CM | POA: Diagnosis not present

## 2018-01-09 DIAGNOSIS — M6281 Muscle weakness (generalized): Secondary | ICD-10-CM | POA: Diagnosis not present

## 2018-01-09 DIAGNOSIS — I2510 Atherosclerotic heart disease of native coronary artery without angina pectoris: Secondary | ICD-10-CM | POA: Diagnosis not present

## 2018-01-09 DIAGNOSIS — I10 Essential (primary) hypertension: Secondary | ICD-10-CM | POA: Diagnosis not present

## 2018-01-09 DIAGNOSIS — G250 Essential tremor: Secondary | ICD-10-CM | POA: Diagnosis not present

## 2018-01-09 DIAGNOSIS — I69398 Other sequelae of cerebral infarction: Secondary | ICD-10-CM | POA: Diagnosis not present

## 2018-01-09 DIAGNOSIS — J45909 Unspecified asthma, uncomplicated: Secondary | ICD-10-CM | POA: Diagnosis not present

## 2018-01-12 DIAGNOSIS — Z8673 Personal history of transient ischemic attack (TIA), and cerebral infarction without residual deficits: Secondary | ICD-10-CM | POA: Diagnosis not present

## 2018-01-12 DIAGNOSIS — J45909 Unspecified asthma, uncomplicated: Secondary | ICD-10-CM | POA: Diagnosis not present

## 2018-01-12 DIAGNOSIS — G250 Essential tremor: Secondary | ICD-10-CM | POA: Diagnosis not present

## 2018-01-12 DIAGNOSIS — I10 Essential (primary) hypertension: Secondary | ICD-10-CM | POA: Diagnosis not present

## 2018-01-12 DIAGNOSIS — E785 Hyperlipidemia, unspecified: Secondary | ICD-10-CM | POA: Diagnosis not present

## 2018-01-12 DIAGNOSIS — J209 Acute bronchitis, unspecified: Secondary | ICD-10-CM | POA: Diagnosis not present

## 2018-01-12 DIAGNOSIS — I2510 Atherosclerotic heart disease of native coronary artery without angina pectoris: Secondary | ICD-10-CM | POA: Diagnosis not present

## 2018-01-12 DIAGNOSIS — J449 Chronic obstructive pulmonary disease, unspecified: Secondary | ICD-10-CM | POA: Diagnosis not present

## 2018-01-12 DIAGNOSIS — M6281 Muscle weakness (generalized): Secondary | ICD-10-CM | POA: Diagnosis not present

## 2018-01-12 DIAGNOSIS — I69398 Other sequelae of cerebral infarction: Secondary | ICD-10-CM | POA: Diagnosis not present

## 2018-01-14 DIAGNOSIS — I2510 Atherosclerotic heart disease of native coronary artery without angina pectoris: Secondary | ICD-10-CM | POA: Diagnosis not present

## 2018-01-14 DIAGNOSIS — I69398 Other sequelae of cerebral infarction: Secondary | ICD-10-CM | POA: Diagnosis not present

## 2018-01-14 DIAGNOSIS — M6281 Muscle weakness (generalized): Secondary | ICD-10-CM | POA: Diagnosis not present

## 2018-01-14 DIAGNOSIS — J45909 Unspecified asthma, uncomplicated: Secondary | ICD-10-CM | POA: Diagnosis not present

## 2018-01-14 DIAGNOSIS — I10 Essential (primary) hypertension: Secondary | ICD-10-CM | POA: Diagnosis not present

## 2018-01-14 DIAGNOSIS — G250 Essential tremor: Secondary | ICD-10-CM | POA: Diagnosis not present

## 2018-01-16 DIAGNOSIS — I2510 Atherosclerotic heart disease of native coronary artery without angina pectoris: Secondary | ICD-10-CM | POA: Diagnosis not present

## 2018-01-16 DIAGNOSIS — G250 Essential tremor: Secondary | ICD-10-CM | POA: Diagnosis not present

## 2018-01-16 DIAGNOSIS — Z8673 Personal history of transient ischemic attack (TIA), and cerebral infarction without residual deficits: Secondary | ICD-10-CM | POA: Diagnosis not present

## 2018-01-16 DIAGNOSIS — I10 Essential (primary) hypertension: Secondary | ICD-10-CM | POA: Diagnosis not present

## 2018-01-16 DIAGNOSIS — M6281 Muscle weakness (generalized): Secondary | ICD-10-CM | POA: Diagnosis not present

## 2018-01-16 DIAGNOSIS — E785 Hyperlipidemia, unspecified: Secondary | ICD-10-CM | POA: Diagnosis not present

## 2018-01-16 DIAGNOSIS — I69398 Other sequelae of cerebral infarction: Secondary | ICD-10-CM | POA: Diagnosis not present

## 2018-01-16 DIAGNOSIS — J45909 Unspecified asthma, uncomplicated: Secondary | ICD-10-CM | POA: Diagnosis not present

## 2018-01-19 DIAGNOSIS — G250 Essential tremor: Secondary | ICD-10-CM | POA: Diagnosis not present

## 2018-01-19 DIAGNOSIS — M6281 Muscle weakness (generalized): Secondary | ICD-10-CM | POA: Diagnosis not present

## 2018-01-19 DIAGNOSIS — I2510 Atherosclerotic heart disease of native coronary artery without angina pectoris: Secondary | ICD-10-CM | POA: Diagnosis not present

## 2018-01-19 DIAGNOSIS — I10 Essential (primary) hypertension: Secondary | ICD-10-CM | POA: Diagnosis not present

## 2018-01-19 DIAGNOSIS — J45909 Unspecified asthma, uncomplicated: Secondary | ICD-10-CM | POA: Diagnosis not present

## 2018-01-19 DIAGNOSIS — I69398 Other sequelae of cerebral infarction: Secondary | ICD-10-CM | POA: Diagnosis not present

## 2018-01-20 DIAGNOSIS — J45909 Unspecified asthma, uncomplicated: Secondary | ICD-10-CM | POA: Diagnosis not present

## 2018-01-20 DIAGNOSIS — G250 Essential tremor: Secondary | ICD-10-CM | POA: Diagnosis not present

## 2018-01-20 DIAGNOSIS — I10 Essential (primary) hypertension: Secondary | ICD-10-CM | POA: Diagnosis not present

## 2018-01-20 DIAGNOSIS — I2510 Atherosclerotic heart disease of native coronary artery without angina pectoris: Secondary | ICD-10-CM | POA: Diagnosis not present

## 2018-01-20 DIAGNOSIS — M6281 Muscle weakness (generalized): Secondary | ICD-10-CM | POA: Diagnosis not present

## 2018-01-20 DIAGNOSIS — I69398 Other sequelae of cerebral infarction: Secondary | ICD-10-CM | POA: Diagnosis not present

## 2018-01-21 DIAGNOSIS — I69398 Other sequelae of cerebral infarction: Secondary | ICD-10-CM | POA: Diagnosis not present

## 2018-01-21 DIAGNOSIS — J45909 Unspecified asthma, uncomplicated: Secondary | ICD-10-CM | POA: Diagnosis not present

## 2018-01-21 DIAGNOSIS — I10 Essential (primary) hypertension: Secondary | ICD-10-CM | POA: Diagnosis not present

## 2018-01-21 DIAGNOSIS — G250 Essential tremor: Secondary | ICD-10-CM | POA: Diagnosis not present

## 2018-01-21 DIAGNOSIS — M6281 Muscle weakness (generalized): Secondary | ICD-10-CM | POA: Diagnosis not present

## 2018-01-21 DIAGNOSIS — I2510 Atherosclerotic heart disease of native coronary artery without angina pectoris: Secondary | ICD-10-CM | POA: Diagnosis not present

## 2018-01-23 DIAGNOSIS — J45909 Unspecified asthma, uncomplicated: Secondary | ICD-10-CM | POA: Diagnosis not present

## 2018-01-23 DIAGNOSIS — I2510 Atherosclerotic heart disease of native coronary artery without angina pectoris: Secondary | ICD-10-CM | POA: Diagnosis not present

## 2018-01-23 DIAGNOSIS — M6281 Muscle weakness (generalized): Secondary | ICD-10-CM | POA: Diagnosis not present

## 2018-01-23 DIAGNOSIS — G250 Essential tremor: Secondary | ICD-10-CM | POA: Diagnosis not present

## 2018-01-23 DIAGNOSIS — I10 Essential (primary) hypertension: Secondary | ICD-10-CM | POA: Diagnosis not present

## 2018-01-23 DIAGNOSIS — I69398 Other sequelae of cerebral infarction: Secondary | ICD-10-CM | POA: Diagnosis not present

## 2018-01-26 ENCOUNTER — Other Ambulatory Visit: Payer: Self-pay | Admitting: *Deleted

## 2018-01-26 DIAGNOSIS — M6281 Muscle weakness (generalized): Secondary | ICD-10-CM | POA: Diagnosis not present

## 2018-01-26 DIAGNOSIS — J45909 Unspecified asthma, uncomplicated: Secondary | ICD-10-CM | POA: Diagnosis not present

## 2018-01-26 DIAGNOSIS — G250 Essential tremor: Secondary | ICD-10-CM | POA: Diagnosis not present

## 2018-01-26 DIAGNOSIS — I69398 Other sequelae of cerebral infarction: Secondary | ICD-10-CM | POA: Diagnosis not present

## 2018-01-26 DIAGNOSIS — I10 Essential (primary) hypertension: Secondary | ICD-10-CM | POA: Diagnosis not present

## 2018-01-26 DIAGNOSIS — I2510 Atherosclerotic heart disease of native coronary artery without angina pectoris: Secondary | ICD-10-CM | POA: Diagnosis not present

## 2018-01-26 MED ORDER — TICAGRELOR 90 MG PO TABS: 90.0000 mg | ORAL_TABLET | Freq: Two times a day (BID) | ORAL | 0 refills | Status: DC

## 2018-01-27 DIAGNOSIS — D519 Vitamin B12 deficiency anemia, unspecified: Secondary | ICD-10-CM | POA: Diagnosis not present

## 2018-01-27 DIAGNOSIS — E2740 Unspecified adrenocortical insufficiency: Secondary | ICD-10-CM | POA: Diagnosis not present

## 2018-01-27 DIAGNOSIS — E559 Vitamin D deficiency, unspecified: Secondary | ICD-10-CM | POA: Diagnosis not present

## 2018-01-27 DIAGNOSIS — G250 Essential tremor: Secondary | ICD-10-CM | POA: Diagnosis not present

## 2018-01-27 DIAGNOSIS — E032 Hypothyroidism due to medicaments and other exogenous substances: Secondary | ICD-10-CM | POA: Diagnosis not present

## 2018-01-27 DIAGNOSIS — E59 Dietary selenium deficiency: Secondary | ICD-10-CM | POA: Diagnosis not present

## 2018-01-27 DIAGNOSIS — B2790 Infectious mononucleosis, unspecified without complication: Secondary | ICD-10-CM | POA: Diagnosis not present

## 2018-01-27 DIAGNOSIS — I69398 Other sequelae of cerebral infarction: Secondary | ICD-10-CM | POA: Diagnosis not present

## 2018-01-27 DIAGNOSIS — E7211 Homocystinuria: Secondary | ICD-10-CM | POA: Diagnosis not present

## 2018-01-27 DIAGNOSIS — M6281 Muscle weakness (generalized): Secondary | ICD-10-CM | POA: Diagnosis not present

## 2018-01-27 DIAGNOSIS — I2510 Atherosclerotic heart disease of native coronary artery without angina pectoris: Secondary | ICD-10-CM | POA: Diagnosis not present

## 2018-01-27 DIAGNOSIS — I10 Essential (primary) hypertension: Secondary | ICD-10-CM | POA: Diagnosis not present

## 2018-01-27 DIAGNOSIS — D529 Folate deficiency anemia, unspecified: Secondary | ICD-10-CM | POA: Diagnosis not present

## 2018-01-27 DIAGNOSIS — J45909 Unspecified asthma, uncomplicated: Secondary | ICD-10-CM | POA: Diagnosis not present

## 2018-01-27 DIAGNOSIS — E539 Vitamin B deficiency, unspecified: Secondary | ICD-10-CM | POA: Diagnosis not present

## 2018-02-03 DIAGNOSIS — I10 Essential (primary) hypertension: Secondary | ICD-10-CM | POA: Diagnosis not present

## 2018-02-03 DIAGNOSIS — I69398 Other sequelae of cerebral infarction: Secondary | ICD-10-CM | POA: Diagnosis not present

## 2018-02-03 DIAGNOSIS — G250 Essential tremor: Secondary | ICD-10-CM | POA: Diagnosis not present

## 2018-02-03 DIAGNOSIS — I2510 Atherosclerotic heart disease of native coronary artery without angina pectoris: Secondary | ICD-10-CM | POA: Diagnosis not present

## 2018-02-03 DIAGNOSIS — J45909 Unspecified asthma, uncomplicated: Secondary | ICD-10-CM | POA: Diagnosis not present

## 2018-02-03 DIAGNOSIS — M6281 Muscle weakness (generalized): Secondary | ICD-10-CM | POA: Diagnosis not present

## 2018-02-04 DIAGNOSIS — I10 Essential (primary) hypertension: Secondary | ICD-10-CM | POA: Diagnosis not present

## 2018-02-04 DIAGNOSIS — J00 Acute nasopharyngitis [common cold]: Secondary | ICD-10-CM | POA: Diagnosis not present

## 2018-02-04 DIAGNOSIS — F411 Generalized anxiety disorder: Secondary | ICD-10-CM | POA: Diagnosis not present

## 2018-02-04 DIAGNOSIS — Z9181 History of falling: Secondary | ICD-10-CM | POA: Diagnosis not present

## 2018-02-16 DIAGNOSIS — G464 Cerebellar stroke syndrome: Secondary | ICD-10-CM | POA: Diagnosis not present

## 2018-02-16 DIAGNOSIS — M6281 Muscle weakness (generalized): Secondary | ICD-10-CM | POA: Diagnosis not present

## 2018-02-20 DIAGNOSIS — M6281 Muscle weakness (generalized): Secondary | ICD-10-CM | POA: Diagnosis not present

## 2018-02-20 DIAGNOSIS — G464 Cerebellar stroke syndrome: Secondary | ICD-10-CM | POA: Diagnosis not present

## 2018-03-13 DIAGNOSIS — Z9181 History of falling: Secondary | ICD-10-CM | POA: Diagnosis not present

## 2018-03-13 DIAGNOSIS — F411 Generalized anxiety disorder: Secondary | ICD-10-CM | POA: Diagnosis not present

## 2018-03-13 DIAGNOSIS — I10 Essential (primary) hypertension: Secondary | ICD-10-CM | POA: Diagnosis not present

## 2018-03-13 DIAGNOSIS — R197 Diarrhea, unspecified: Secondary | ICD-10-CM | POA: Diagnosis not present

## 2018-03-13 DIAGNOSIS — R110 Nausea: Secondary | ICD-10-CM | POA: Diagnosis not present

## 2018-03-13 DIAGNOSIS — J00 Acute nasopharyngitis [common cold]: Secondary | ICD-10-CM | POA: Diagnosis not present

## 2018-03-18 DIAGNOSIS — G464 Cerebellar stroke syndrome: Secondary | ICD-10-CM | POA: Diagnosis not present

## 2018-03-18 DIAGNOSIS — M6281 Muscle weakness (generalized): Secondary | ICD-10-CM | POA: Diagnosis not present

## 2018-03-20 ENCOUNTER — Encounter: Payer: Self-pay | Admitting: Physician Assistant

## 2018-03-20 ENCOUNTER — Ambulatory Visit (INDEPENDENT_AMBULATORY_CARE_PROVIDER_SITE_OTHER): Payer: Medicare Other | Admitting: Physician Assistant

## 2018-03-20 VITALS — BP 116/99 | HR 114 | Ht 61.5 in | Wt 169.0 lb

## 2018-03-20 DIAGNOSIS — G464 Cerebellar stroke syndrome: Secondary | ICD-10-CM | POA: Diagnosis not present

## 2018-03-20 DIAGNOSIS — M6281 Muscle weakness (generalized): Secondary | ICD-10-CM | POA: Diagnosis not present

## 2018-03-20 DIAGNOSIS — I6322 Cerebral infarction due to unspecified occlusion or stenosis of basilar arteries: Secondary | ICD-10-CM

## 2018-03-20 DIAGNOSIS — N644 Mastodynia: Secondary | ICD-10-CM | POA: Diagnosis not present

## 2018-03-20 DIAGNOSIS — N6312 Unspecified lump in the right breast, upper inner quadrant: Secondary | ICD-10-CM

## 2018-03-20 DIAGNOSIS — R0789 Other chest pain: Secondary | ICD-10-CM

## 2018-03-20 NOTE — Progress Notes (Signed)
Subjective:    Patient ID: Kathleen Dunn, female    DOB: 04-28-1941, 77 y.o.   MRN: [id number]  HPI  Pt is a 77 yo female with hx of basilar artery stenosis/occusion/stroke, HTN, hypothyroidism who presents to the clinic with bilateral chest wall pain but worsening pain over the left breast. Pain started a few months ago and comes and goes. She started PT back in October to regain strength after her stroke. PT continues to become more intense. She does see some relationship in PT and pain. Last mammogram was 05/31/14. No nipple discharge or retractions. She does have a right breast lump per pt has been there a while.   .. Active Ambulatory Problems    Diagnosis Date Noted  . Hypothyroidism 12/04/2007  . HYPERCHOLESTEROLEMIA 06/24/2006  . HYPONATREMIA 12/04/2007  . PERNICIOUS ANEMIA 06/24/2006  . DEPRESSION 01/08/2008  . HYPERTENSION, BENIGN SYSTEMIC 06/24/2006  . LOW BACK PAIN 06/24/2006  . FATIGUE 01/08/2008  . TREMOR 06/24/2006  . ABNORMAL MAMMOGRAM 08/25/2007  . Chronic fatigue 02/18/2013  . Obesity (BMI 30.0-34.9) 08/26/2013  . Lung nodule < 6cm on CT 05/20/2014  . IFG (impaired fasting glucose) 01/12/2015  . Coronary artery calcification 02/17/2015  . GAD (generalized anxiety disorder) 11/06/2015  . Cerebellar stroke ([place]) 03/15/2017  . Mixed hyperlipidemia   . Basilar artery stenosis   . Bilateral carotid artery stenosis   . Cerebrovascular accident (CVA) due to thrombosis of precerebral artery ([place])   . Basilar artery stenosis/occlusion with infarction ([place]) 03/26/2017  . Primary insomnia 05/06/2017  . Essential tremor 05/06/2017  . History of cerebellar stroke 05/06/2017  . Breast lump on right side at 1 o'clock position 03/23/2018   Resolved Ambulatory Problems    Diagnosis Date Noted  . No Resolved Ambulatory Problems   Past Medical History:  Diagnosis Date  . Asthma   . Hypertension   . Lung nodule < 6cm on CT   . Stroke ([place])   . Thyroid disease       Review of Systems See HPI.     Objective:   Physical Exam  Constitutional: She appears well-developed and well-nourished.  HENT:  Head: Normocephalic and atraumatic.  Neck: Normal range of motion. Neck supple.  Cardiovascular: Normal rate and regular rhythm.  Pulmonary/Chest: Effort normal and breath sounds normal. She exhibits tenderness.    Lymphadenopathy:    She has no cervical adenopathy.  Neurological: She is alert.  Psychiatric: She has a normal mood and affect. Her behavior is normal.          Assessment & Plan:  .Marland KitchenCharlisa was seen today for breast pain.  Diagnoses and all orders for this visit:  Breast pain -     MM Digital Diagnostic Bilat  Chest wall pain -     MM Digital Diagnostic Bilat  Breast lump on right side at 1 o'clock position -     MM Digital Diagnostic Bilat   I suspect breast pain is likely due to PT that is ongoing and musculoskeletal. Pt is due for screening mammogram. I will order diagnostic mammogram for evaluation. Needs ordered close to her house so she can get a ride. Discussed voltaren gel. She declined today. Encouraged icing and heat alternating over chest wall. Per pt right breast lump has been evaluated before and was benign. I do not see record of this.   Pt mentions numerous questions but I defer to PCP. Pt needs follow up.   Pt very concerned spent 30 minutes with patient  and greater than 50 percent counseling and treatment plan

## 2018-03-23 ENCOUNTER — Encounter: Payer: Self-pay | Admitting: Physician Assistant

## 2018-03-23 DIAGNOSIS — N6312 Unspecified lump in the right breast, upper inner quadrant: Secondary | ICD-10-CM | POA: Insufficient documentation

## 2018-03-23 DIAGNOSIS — N644 Mastodynia: Secondary | ICD-10-CM | POA: Insufficient documentation

## 2018-03-23 DIAGNOSIS — R0789 Other chest pain: Secondary | ICD-10-CM | POA: Insufficient documentation

## 2018-03-24 DIAGNOSIS — G464 Cerebellar stroke syndrome: Secondary | ICD-10-CM | POA: Diagnosis not present

## 2018-03-24 DIAGNOSIS — M6281 Muscle weakness (generalized): Secondary | ICD-10-CM | POA: Diagnosis not present

## 2018-03-30 ENCOUNTER — Telehealth: Payer: Self-pay

## 2018-03-30 DIAGNOSIS — M6281 Muscle weakness (generalized): Secondary | ICD-10-CM | POA: Diagnosis not present

## 2018-03-30 DIAGNOSIS — G464 Cerebellar stroke syndrome: Secondary | ICD-10-CM | POA: Diagnosis not present

## 2018-03-30 NOTE — Telephone Encounter (Signed)
Call [facility] but I have not seen results yet.

## 2018-03-30 NOTE — Telephone Encounter (Signed)
Hope called for Mammogram results.

## 2018-03-31 NOTE — Telephone Encounter (Signed)
When I called [facility] they said she hasn't even gone to have the Mammogram.

## 2018-04-01 DIAGNOSIS — M6281 Muscle weakness (generalized): Secondary | ICD-10-CM | POA: Diagnosis not present

## 2018-04-01 DIAGNOSIS — G464 Cerebellar stroke syndrome: Secondary | ICD-10-CM | POA: Diagnosis not present

## 2018-04-07 DIAGNOSIS — M6281 Muscle weakness (generalized): Secondary | ICD-10-CM | POA: Diagnosis not present

## 2018-04-07 DIAGNOSIS — G464 Cerebellar stroke syndrome: Secondary | ICD-10-CM | POA: Diagnosis not present

## 2018-04-11 DIAGNOSIS — F411 Generalized anxiety disorder: Secondary | ICD-10-CM | POA: Diagnosis not present

## 2018-04-11 DIAGNOSIS — I10 Essential (primary) hypertension: Secondary | ICD-10-CM | POA: Diagnosis not present

## 2018-04-11 DIAGNOSIS — H6692 Otitis media, unspecified, left ear: Secondary | ICD-10-CM | POA: Diagnosis not present

## 2018-04-11 DIAGNOSIS — J984 Other disorders of lung: Secondary | ICD-10-CM | POA: Diagnosis not present

## 2018-04-14 DIAGNOSIS — M6281 Muscle weakness (generalized): Secondary | ICD-10-CM | POA: Diagnosis not present

## 2018-04-14 DIAGNOSIS — G464 Cerebellar stroke syndrome: Secondary | ICD-10-CM | POA: Diagnosis not present

## 2018-04-16 DIAGNOSIS — G464 Cerebellar stroke syndrome: Secondary | ICD-10-CM | POA: Diagnosis not present

## 2018-04-16 DIAGNOSIS — M6281 Muscle weakness (generalized): Secondary | ICD-10-CM | POA: Diagnosis not present

## 2018-04-17 DIAGNOSIS — M6281 Muscle weakness (generalized): Secondary | ICD-10-CM | POA: Diagnosis not present

## 2018-04-17 DIAGNOSIS — G464 Cerebellar stroke syndrome: Secondary | ICD-10-CM | POA: Diagnosis not present

## 2018-04-20 DIAGNOSIS — M6281 Muscle weakness (generalized): Secondary | ICD-10-CM | POA: Diagnosis not present

## 2018-04-20 DIAGNOSIS — G464 Cerebellar stroke syndrome: Secondary | ICD-10-CM | POA: Diagnosis not present

## 2018-04-22 DIAGNOSIS — M6281 Muscle weakness (generalized): Secondary | ICD-10-CM | POA: Diagnosis not present

## 2018-04-22 DIAGNOSIS — G464 Cerebellar stroke syndrome: Secondary | ICD-10-CM | POA: Diagnosis not present

## 2018-04-24 DIAGNOSIS — M6281 Muscle weakness (generalized): Secondary | ICD-10-CM | POA: Diagnosis not present

## 2018-04-24 DIAGNOSIS — G464 Cerebellar stroke syndrome: Secondary | ICD-10-CM | POA: Diagnosis not present

## 2018-04-28 DIAGNOSIS — M6281 Muscle weakness (generalized): Secondary | ICD-10-CM | POA: Diagnosis not present

## 2018-04-28 DIAGNOSIS — G464 Cerebellar stroke syndrome: Secondary | ICD-10-CM | POA: Diagnosis not present

## 2018-04-29 DIAGNOSIS — M6281 Muscle weakness (generalized): Secondary | ICD-10-CM | POA: Diagnosis not present

## 2018-04-29 DIAGNOSIS — G464 Cerebellar stroke syndrome: Secondary | ICD-10-CM | POA: Diagnosis not present

## 2018-05-04 DIAGNOSIS — G464 Cerebellar stroke syndrome: Secondary | ICD-10-CM | POA: Diagnosis not present

## 2018-05-04 DIAGNOSIS — M6281 Muscle weakness (generalized): Secondary | ICD-10-CM | POA: Diagnosis not present

## 2018-05-11 DIAGNOSIS — G464 Cerebellar stroke syndrome: Secondary | ICD-10-CM | POA: Diagnosis not present

## 2018-05-11 DIAGNOSIS — M6281 Muscle weakness (generalized): Secondary | ICD-10-CM | POA: Diagnosis not present

## 2018-05-13 DIAGNOSIS — M6281 Muscle weakness (generalized): Secondary | ICD-10-CM | POA: Diagnosis not present

## 2018-05-13 DIAGNOSIS — G464 Cerebellar stroke syndrome: Secondary | ICD-10-CM | POA: Diagnosis not present

## 2018-05-15 DIAGNOSIS — I10 Essential (primary) hypertension: Secondary | ICD-10-CM | POA: Diagnosis not present

## 2018-05-20 DIAGNOSIS — M6281 Muscle weakness (generalized): Secondary | ICD-10-CM | POA: Diagnosis not present

## 2018-05-20 DIAGNOSIS — G464 Cerebellar stroke syndrome: Secondary | ICD-10-CM | POA: Diagnosis not present

## 2018-05-22 DIAGNOSIS — M6281 Muscle weakness (generalized): Secondary | ICD-10-CM | POA: Diagnosis not present

## 2018-05-22 DIAGNOSIS — G464 Cerebellar stroke syndrome: Secondary | ICD-10-CM | POA: Diagnosis not present

## 2018-05-23 DIAGNOSIS — E785 Hyperlipidemia, unspecified: Secondary | ICD-10-CM | POA: Diagnosis not present

## 2018-05-23 DIAGNOSIS — H547 Unspecified visual loss: Secondary | ICD-10-CM | POA: Diagnosis not present

## 2018-05-23 DIAGNOSIS — F411 Generalized anxiety disorder: Secondary | ICD-10-CM | POA: Diagnosis not present

## 2018-05-23 DIAGNOSIS — E039 Hypothyroidism, unspecified: Secondary | ICD-10-CM | POA: Diagnosis not present

## 2018-05-23 DIAGNOSIS — I10 Essential (primary) hypertension: Secondary | ICD-10-CM | POA: Diagnosis not present

## 2018-05-25 DIAGNOSIS — M6281 Muscle weakness (generalized): Secondary | ICD-10-CM | POA: Diagnosis not present

## 2018-05-25 DIAGNOSIS — G464 Cerebellar stroke syndrome: Secondary | ICD-10-CM | POA: Diagnosis not present

## 2018-05-29 ENCOUNTER — Telehealth: Payer: Self-pay

## 2018-05-29 NOTE — Telephone Encounter (Signed)
Pt requesting Rf on Depo-Estradiol 5 mg/ml injection, 2.5 mg as directed every 2 to 3 weeks  Last written by Historical Provider. Please advise on refill.

## 2018-06-01 NOTE — Telephone Encounter (Signed)
Left message for a return call

## 2018-06-01 NOTE — Telephone Encounter (Signed)
I have actually not written her estrogen since 2017 some not sure if her OB/GYN has been writing it for her.  She was also recently seen for some breast pain and lump.  And as far as I can tell it looks like she has not gone for her mammogram.  I am not going to restart her hormone therapy unless she is evaluated for her breast concerns.  Beatrice Lecher, MD

## 2018-06-09 NOTE — Telephone Encounter (Signed)
Pt called back- only wishes to talk to Kathleen Dunn. Ang, can you please call her upon your return? Thanks!

## 2018-06-12 DIAGNOSIS — H2513 Age-related nuclear cataract, bilateral: Secondary | ICD-10-CM | POA: Diagnosis not present

## 2018-06-12 DIAGNOSIS — H02831 Dermatochalasis of right upper eyelid: Secondary | ICD-10-CM | POA: Diagnosis not present

## 2018-06-12 DIAGNOSIS — H02889 Meibomian gland dysfunction of unspecified eye, unspecified eyelid: Secondary | ICD-10-CM | POA: Diagnosis not present

## 2018-06-12 NOTE — Telephone Encounter (Signed)
Left a message for a return call.

## 2018-06-12 NOTE — Telephone Encounter (Signed)
Routing...

## 2018-06-17 DIAGNOSIS — E039 Hypothyroidism, unspecified: Secondary | ICD-10-CM | POA: Diagnosis not present

## 2018-06-17 DIAGNOSIS — R7301 Impaired fasting glucose: Secondary | ICD-10-CM | POA: Diagnosis not present

## 2018-06-17 DIAGNOSIS — E785 Hyperlipidemia, unspecified: Secondary | ICD-10-CM | POA: Diagnosis not present

## 2018-06-17 DIAGNOSIS — Z8673 Personal history of transient ischemic attack (TIA), and cerebral infarction without residual deficits: Secondary | ICD-10-CM | POA: Diagnosis not present

## 2018-06-17 DIAGNOSIS — I10 Essential (primary) hypertension: Secondary | ICD-10-CM | POA: Diagnosis not present

## 2018-06-17 DIAGNOSIS — I639 Cerebral infarction, unspecified: Secondary | ICD-10-CM | POA: Diagnosis not present

## 2018-06-26 ENCOUNTER — Telehealth: Payer: Self-pay | Admitting: Family Medicine

## 2018-06-26 DIAGNOSIS — Z1239 Encounter for other screening for malignant neoplasm of breast: Secondary | ICD-10-CM

## 2018-06-26 NOTE — Telephone Encounter (Signed)
PT is requesting a Medicare Physical to be done by Dr. Madilyn Fireman. The earliest appointment we have for a medicare physical is 3 Weeks out. She stated it needs to be sooner.   Please advise.

## 2018-06-26 NOTE — Telephone Encounter (Signed)
Please inform pt that she has the option to be seen by the [facility] to do her medicare wellness visit, or she can wait until Dr. Madilyn Fireman has an opening.Elouise Munroe, [place]

## 2018-07-01 NOTE — Telephone Encounter (Signed)
All resolved. No further information needed.

## 2018-07-13 ENCOUNTER — Encounter: Payer: Medicare Other | Admitting: Family Medicine

## 2018-07-14 ENCOUNTER — Telehealth (HOSPITAL_COMMUNITY): Payer: Self-pay

## 2018-07-14 NOTE — Telephone Encounter (Signed)
Called to schedule f/u, no answer, left vm. AW 

## 2018-07-21 ENCOUNTER — Telehealth (HOSPITAL_COMMUNITY): Payer: Self-pay

## 2018-07-21 NOTE — Telephone Encounter (Signed)
Returned pt's phone call, no answer, left vm. AW

## 2018-07-28 ENCOUNTER — Other Ambulatory Visit (HOSPITAL_COMMUNITY): Payer: Self-pay | Admitting: Interventional Radiology

## 2018-07-28 DIAGNOSIS — I771 Stricture of artery: Secondary | ICD-10-CM

## 2018-07-30 ENCOUNTER — Ambulatory Visit (HOSPITAL_COMMUNITY)
Admission: RE | Admit: 2018-07-30 | Discharge: 2018-07-30 | Disposition: A | Discharge status: Home / Self Care | Payer: Medicare Other | Source: Ambulatory Visit | Attending: Interventional Radiology | Admitting: Interventional Radiology

## 2018-07-30 ENCOUNTER — Ambulatory Visit (HOSPITAL_COMMUNITY): Admission: RE | Admit: 2018-07-30 | Payer: Medicare Other | Source: Ambulatory Visit

## 2018-07-30 ENCOUNTER — Other Ambulatory Visit (HOSPITAL_COMMUNITY): Payer: Self-pay | Admitting: Interventional Radiology

## 2018-07-30 DIAGNOSIS — I63233 Cerebral infarction due to unspecified occlusion or stenosis of bilateral carotid arteries: Secondary | ICD-10-CM | POA: Diagnosis not present

## 2018-07-30 DIAGNOSIS — I639 Cerebral infarction, unspecified: Secondary | ICD-10-CM | POA: Diagnosis not present

## 2018-07-30 DIAGNOSIS — I6521 Occlusion and stenosis of right carotid artery: Secondary | ICD-10-CM | POA: Diagnosis not present

## 2018-07-30 DIAGNOSIS — I771 Stricture of artery: Secondary | ICD-10-CM | POA: Insufficient documentation

## 2018-07-30 LAB — POCT I-STAT CREATININE: Creatinine, Ser: 0.8 mg/dL (ref 0.44–1.00)

## 2018-07-30 MED ORDER — IOPAMIDOL (ISOVUE-370) INJECTION 76%
100.0000 mL | Freq: Once | INTRAVENOUS | Status: AC | PRN
  Administered 2018-07-30: 100 mL via INTRAVENOUS

## 2018-07-30 MED ORDER — IOPAMIDOL (ISOVUE-370) INJECTION 76%
INTRAVENOUS | Status: DC
Start: 2018-07-30 — End: 2018-07-31
  Filled 2018-07-30: qty 100

## 2018-08-05 ENCOUNTER — Ambulatory Visit (HOSPITAL_COMMUNITY): Payer: Medicare Other

## 2018-08-06 ENCOUNTER — Ambulatory Visit (HOSPITAL_COMMUNITY)
Admission: RE | Admit: 2018-08-06 | Discharge: 2018-08-06 | Disposition: A | Discharge status: Home / Self Care | Payer: Medicare Other | Source: Ambulatory Visit | Attending: Interventional Radiology | Admitting: Interventional Radiology

## 2018-08-06 DIAGNOSIS — I639 Cerebral infarction, unspecified: Secondary | ICD-10-CM

## 2018-08-06 NOTE — Progress Notes (Addendum)
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 No acute intracranial process. 2. 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 CTA NECK: 1. Similar 50% stenosis RIGHT ICA. No hemodynamically significant stenosis LEFT ICA. 2. Hypoplastic RIGHT vertebral artery, patent vertebral arteries within the neck. CTA HEAD: 1. No emergent large vessel occlusion or flow-limiting stenosis. 2. Nonvisualized RIGHT V4 segment, stable. Patent angioplastied basilar artery with moderate stenosis. 3. Moderate atherosclerosis predominately involving posterior Circulation.  She is doing well Still complains of feeling fatigue and slow to regain full abilities. She is exercising almost daily-- walking and activities. Has stopped Brilinta in April 2019 per Dr Estanislado Pandy- per pt Using ASA 81 mg daily Denies numbness; tingling; speech or vision changes Denies headache; N/V Doing well overall  Allergies: Codeine; Hydrocodone-acetaminophen; Morphine; Lipitor [atorvastatin]; Penicillins; Maxitrol [neomycin-polymyxin-dexameth]; Albuterol; Erythromycin; Hydroxyzine pamoate; Iodine; Simvastatin; and Sulfonamide derivatives  Medications: Prior to Admission medications   Medication Sig Start Date End Date Taking? Authorizing Provider  acetaminophen (TYLENOL) 500 MG tablet Take 500-1,000 mg by mouth every 6 (six) hours as needed for headache (pain).     [provider]  acetic acid-hydrocortisone (VOSOL-HC) otic solution Place 3 drops into both ears 3 (three) times daily. Patient taking differently: Place 3 drops into  both ears 3 (three) times daily as needed (itching).  06/21/16   Hali Marry, MD  aspirin EC 81 MG EC tablet Take 1 tablet (81 mg total) by mouth daily. 04/02/17   Mariel Aloe, MD  calcium carbonate (TUMS - DOSED IN MG ELEMENTAL CALCIUM) 500 MG chewable tablet Chew 1-2 tablets by mouth daily as needed for indigestion or heartburn.    [provider]  Cholecalciferol (VITAMIN D3) 5000 units CAPS Take 5,000 Units by mouth daily.     [provider]  clonazePAM (KLONOPIN) 1 MG tablet Take 1 tablet (1 mg total) by mouth at bedtime as needed (anxiety, insomnia). Patient taking differently: Take 1 mg 2 (two) times daily as needed by mouth (anxiety, insomnia).  05/06/17   Hali Marry, MD  Cyanocobalamin (B-12 COMPLIANCE INJECTION IJ) Inject 1,000 Units as directed. Every 1 to 2 weeks    [provider]  DEPO-ESTRADIOL 5 MG/ML injection Inject 2.5 mg as directed. Every 2 to 3 weeks 10/20/17   [provider]  diclofenac sodium (VOLTAREN) 1 % GEL Apply 4 g 4 (four) times daily topically. Patient taking differently: Apply 4 g topically 4 (four) times daily as needed (pain).  08/01/17   Hali Marry, MD  diphenhydrAMINE (BENADRYL) 25 mg capsule Take 25 mg daily as needed by mouth for allergies.    [provider]  docusate sodium (COLACE) 100 MG capsule Take 1 capsule (100 mg total) by mouth 2 (two) times daily as needed for mild constipation. Patient not taking: Reported on 12/24/2017 06/27/17   Hali Marry, MD  [facility] SEAL PO Take 1 capsule by mouth 2 (two) times daily.     [provider]  ezetimibe (ZETIA) 10 MG tablet Take 1 tablet (10 mg total) by mouth daily. 05/23/17   Hali Marry, MD  fluticasone (FLONASE) 50 MCG/ACT nasal spray Place 2 sprays into both nostrils  daily. Patient taking differently: Place 2 sprays daily as needed into both nostrils for allergies.  07/08/17   Hali Marry,  MD  guaiFENesin (MUCINEX) 600 MG 12 hr tablet Take 600 mg by mouth 2 (two) times daily as needed for cough or to loosen phlegm.    [provider]  ketoconazole (NIZORAL) 2 % shampoo Use to shampoo hair up to twice a week as needed for itching 07/09/17   Hali Marry, MD  levothyroxine (SYNTHROID, LEVOTHROID) 50 MCG tablet Take 1 tablet (50 mcg total) by mouth daily. NEED FOLLOW UP VISIT FOR MORE REFILLS 08/06/16   Hali Marry, MD  [facility] PAIN RELIEF PATCH EX) Apply 1 patch topically daily as needed (pain).    [provider]  losartan (COZAAR) 50 MG tablet Take 1 tablet (50 mg total) by mouth daily. Patient taking differently: Take 25 mg by mouth daily.  05/23/17   Hali Marry, MD  metoprolol tartrate (LOPRESSOR) 25 MG tablet Take 1 tablet (25 mg total) by mouth daily. Patient taking differently: Take 12.5 mg by mouth daily as needed (if heart rate goes up).  11/03/15   Hali Marry, MD  Miconazole Nitrate 2 % POWD Apply 1 application topically daily as needed (skin irritations).     [provider]  Omega-3 Fatty Acids (FISH OIL PO) Take 2,080 mg by mouth daily.    [provider]  ondansetron (ZOFRAN) 4 MG tablet Take 4 mg by mouth every 8 (eight) hours as needed for nausea or vomiting.    [provider]  pantoprazole (PROTONIX) 40 MG tablet Take 40 mg by mouth at bedtime.  02/17/17   [provider]  QVAR 40 MCG/ACT inhaler USE 2 INHALATIONS TWICE A DAY Patient taking differently: USE 2 INHALATIONS TWICE A DAY AS NEEDED FOR SHORTNESS OF BREATH/WHEEZING 07/22/14   Hali Marry, MD  rosuvastatin (CRESTOR) 40 MG tablet Take 1 tablet (40 mg total) by mouth daily at 6 PM. 05/23/17   Hali Marry, MD  senna-docusate (SENOKOT-S) 8.6-50 MG tablet Take 1-2 tablets by mouth daily.    [provider]  ticagrelor (BRILINTA) 90 MG TABS tablet Take 1 tablet (90 mg total) by mouth 2  (two) times daily. [place].LAST REFILL. MUST SCHEDULE AN APPOINTMENT FOR REFILLS 01/26/18   Hali Marry, MD  traMADol (ULTRAM) 50 MG tablet Take 1 tablet (50 mg total) every 12 (twelve) hours as needed by mouth (knee pain). Patient taking differently: Take 25-50 mg by mouth daily as needed (knee pain).  07/31/17   Hali Marry, MD  VITAMIN A PO Take 2,400 Units by mouth daily.    [provider]  vitamin C (ASCORBIC ACID) 500 MG tablet Take 500 mg by mouth daily.    [provider]     Vital Signs: There were no vitals taken for this visit.  Physical Exam  Constitutional: She is oriented to person, place, and time. She appears well-nourished.  HENT:  Head: Atraumatic.  Eyes: EOM are normal.  Neck: Neck supple.  Musculoskeletal: Normal range of motion.  Neurological: She is alert and oriented to person, place, and time.  Skin: Skin is warm and dry.  Psychiatric: She has a normal mood and affect. Her behavior is normal.  Vitals reviewed.   Imaging: No results found.  Labs:  CBC: Recent Labs    12/25/17 0838  WBC 6.9  HGB 13.2  HCT 40.3  PLT 243  COAGS: Recent Labs    12/25/17 0838  INR 0.98    BMP: Recent Labs    12/25/17 0838 07/30/18 1418  NA 139  --   K 3.8  --   CL 103  --   CO2 25  --   GLUCOSE 106*  --   BUN 16  --   CALCIUM 9.1  --   CREATININE 0.89 0.80  GFRNONAA >60  --   GFRAA >60  --     LIVER FUNCTION TESTS: No results for input(s): BILITOT, AST, ALT, ALKPHOS, PROT, ALBUMIN in the last 8760 hours.  Assessment and Plan:  Basilar artery stenosis-- angioplasty 03/26/17 Doing well Dr Estanislado Pandy reviewed imaging of 03/26/17 intervention; 12/2017 arteriogram and CTA 07/30/2018 All show stable patency of basilar artery. Reassurance to pt as to how well she is doing and to continue as is. He answered all questions to satisfaction She and daughter have good understanding of plan CTA Neck/Head 6  months   Electronically Signed: Gilberte Gorley A, PA-C 08/06/2018, 3:57 PM   I spent a total of 35 Minutes at the the patient's bedside AND on the patient's hospital floor or unit, greater than 50% of which was counseling/coordinating care for follow up basilar artery stenosis/revascularization

## 2018-08-08 DIAGNOSIS — I10 Essential (primary) hypertension: Secondary | ICD-10-CM | POA: Diagnosis not present

## 2018-08-08 DIAGNOSIS — E785 Hyperlipidemia, unspecified: Secondary | ICD-10-CM | POA: Diagnosis not present

## 2018-08-08 DIAGNOSIS — R001 Bradycardia, unspecified: Secondary | ICD-10-CM | POA: Diagnosis not present

## 2018-08-08 DIAGNOSIS — R6889 Other general symptoms and signs: Secondary | ICD-10-CM | POA: Diagnosis not present

## 2018-08-08 DIAGNOSIS — F411 Generalized anxiety disorder: Secondary | ICD-10-CM | POA: Diagnosis not present

## 2018-08-08 DIAGNOSIS — R7303 Prediabetes: Secondary | ICD-10-CM | POA: Diagnosis not present

## 2018-08-11 ENCOUNTER — Ambulatory Visit: Payer: Medicare Other | Admitting: Family Medicine

## 2018-08-18 ENCOUNTER — Ambulatory Visit: Payer: Medicare Other | Admitting: Family Medicine

## 2018-08-18 ENCOUNTER — Ambulatory Visit (INDEPENDENT_AMBULATORY_CARE_PROVIDER_SITE_OTHER): Payer: Medicare Other | Admitting: Family Medicine

## 2018-08-18 ENCOUNTER — Encounter: Payer: Self-pay | Admitting: Family Medicine

## 2018-08-18 VITALS — BP 155/79 | HR 80 | Ht 59.84 in | Wt 171.0 lb

## 2018-08-18 DIAGNOSIS — J00 Acute nasopharyngitis [common cold]: Secondary | ICD-10-CM | POA: Insufficient documentation

## 2018-08-18 DIAGNOSIS — J984 Other disorders of lung: Secondary | ICD-10-CM | POA: Insufficient documentation

## 2018-08-18 DIAGNOSIS — Z9889 Other specified postprocedural states: Secondary | ICD-10-CM | POA: Diagnosis not present

## 2018-08-18 DIAGNOSIS — H539 Unspecified visual disturbance: Secondary | ICD-10-CM | POA: Insufficient documentation

## 2018-08-18 DIAGNOSIS — F5101 Primary insomnia: Secondary | ICD-10-CM

## 2018-08-18 DIAGNOSIS — E039 Hypothyroidism, unspecified: Secondary | ICD-10-CM | POA: Diagnosis not present

## 2018-08-18 DIAGNOSIS — E785 Hyperlipidemia, unspecified: Secondary | ICD-10-CM

## 2018-08-18 DIAGNOSIS — F419 Anxiety disorder, unspecified: Secondary | ICD-10-CM

## 2018-08-18 DIAGNOSIS — S00521A Blister (nonthermal) of lip, initial encounter: Secondary | ICD-10-CM | POA: Insufficient documentation

## 2018-08-18 DIAGNOSIS — K5909 Other constipation: Secondary | ICD-10-CM | POA: Insufficient documentation

## 2018-08-18 DIAGNOSIS — J209 Acute bronchitis, unspecified: Secondary | ICD-10-CM | POA: Insufficient documentation

## 2018-08-18 DIAGNOSIS — I10 Essential (primary) hypertension: Secondary | ICD-10-CM | POA: Diagnosis not present

## 2018-08-18 DIAGNOSIS — Z9181 History of falling: Secondary | ICD-10-CM | POA: Insufficient documentation

## 2018-08-18 DIAGNOSIS — I6322 Cerebral infarction due to unspecified occlusion or stenosis of basilar arteries: Secondary | ICD-10-CM | POA: Diagnosis not present

## 2018-08-18 DIAGNOSIS — R251 Tremor, unspecified: Secondary | ICD-10-CM | POA: Insufficient documentation

## 2018-08-18 DIAGNOSIS — R197 Diarrhea, unspecified: Secondary | ICD-10-CM | POA: Insufficient documentation

## 2018-08-18 DIAGNOSIS — R2681 Unsteadiness on feet: Secondary | ICD-10-CM | POA: Insufficient documentation

## 2018-08-18 DIAGNOSIS — R42 Dizziness and giddiness: Secondary | ICD-10-CM | POA: Insufficient documentation

## 2018-08-18 DIAGNOSIS — R110 Nausea: Secondary | ICD-10-CM | POA: Insufficient documentation

## 2018-08-18 NOTE — Patient Instructions (Signed)
OK to stop the Zetia and please continue the statin.   His fax me a copy of your lab work that she had done a couple months ago.

## 2018-08-18 NOTE — Progress Notes (Addendum)
Subjective:    CC: Mulitple concerns. Hasn't been seen in over a year.    HPI:  77 year old female comes in today for follow-up I have not seen her in over a year.  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  She has been having [facility] come out to her home to do visits for the last year so I have not seen her in almost over a year.  She has been recovering slowly but feels like she is doing well.  She follows with Dr. Cyndie Chime who is interventional radiology for the basilar stroke.  Her last evaluation was CT of the head back in November of this year.  He is now off of Brilinta and just taking an aspirin daily.  Hypertension- Pt denies chest pain, SOB, dizziness, or heart palpitations.  Taking meds as directed w/o problems.  Denies medication side effects.    Constipation -she is been struggling with her bowels with constipation.  Hyperlipidemia - still on Crestor or Zetia.  When she was in the hospital she was placed on both and not really sure why.  In regards to her clonazepam.  She said she has been on it for probably over 20 years at this point.  It was originally started for sleep which is why she does take it at bedtime most days.  And then she was told to take an extra tab if her blood pressure was high to help lower anxiety.  But she said she never took it for panic attacks.  She now takes 1-2 daily.  Also came in July for breast pain and a lump on the right breast.  She said she had forgotten that she had already had a biopsy in that area as well as some old scar tissue after her stroke.  She says since her stroke there just little things like that that she is kind of forgotten about and her daughter reminded her.  She did not go for the diagnostic mammogram evaluation because she says that it got better and says she is just not worried about it she feels like it is the same scar tissue that has been there.  Last mammogram on file for her was 2015 and was normal.  Me to repeat  her breast exam today.   BP (!) 155/79   Pulse 80   Ht 4' 11.84" (1.52 m)   Wt 171 lb (77.6 kg)   SpO2 96%   BMI 33.57 kg/m     Allergies  Allergen Reactions  . Codeine Shortness Of Breath and Other (See Comments)    Chest pain and swelling  . Hydrocodone-Acetaminophen Anaphylaxis  . Morphine Shortness Of Breath, Rash and Other (See Comments)    Chest swelling  . Morphine And Related Anaphylaxis, Rash and Shortness Of Breath    Other reaction(s): Respiratory distress, Unknown  . Tetracycline     Other reaction(s): Unknown  . Lipitor [Atorvastatin] Other (See Comments)    Cause joint pain  . Penicillins Rash and Other (See Comments)     PATIENT HAS HAD A PCN REACTION WITH IMMEDIATE RASH, FACIAL/TONGUE/THROAT SWELLING, SOB, OR LIGHTHEADEDNESS WITH HYPOTENSION:  #  #  #  YES  #  #  #   Has patient had a PCN reaction causing severe rash involving mucus membranes or skin necrosis: No Has patient had a PCN reaction that required hospitalization: No Has patient had a PCN reaction occurring within the last 10 years: No If all of  the above answers are "NO", then may proceed with Cephalosporin use.  . Maxitrol [Neomycin-Polymyxin-Dexameth]     Eyes itching   . Albuterol Other (See Comments)    Shaking (1 puff does not cause the shaking)  . Erythromycin Rash and Itching  . Hydroxyzine Pamoate Nausea And Vomiting       . Iodine Rash  . Simvastatin Other (See Comments)    Stomach ache  . Sulfonamide Derivatives Rash    Past Medical History:  Diagnosis Date  . Asthma   . Hypertension   . Lung nodule < 6cm on CT   . Stroke ([place])   . Thyroid disease     Past Surgical History:  Procedure Laterality Date  . ABDOMINAL HYSTERECTOMY  age 43   oophorectomy  . BREAST SURGERY     benign bx of left breast  . IR ANGIO INTRA EXTRACRAN SEL COM CAROTID INNOMINATE BILAT MOD SED  03/20/2017  . IR ANGIO INTRA EXTRACRAN SEL COM CAROTID INNOMINATE BILAT MOD SED  12/25/2017  . IR ANGIO  VERTEBRAL SEL SUBCLAVIAN INNOMINATE UNI R MOD SED  03/20/2017  . IR ANGIO VERTEBRAL SEL SUBCLAVIAN INNOMINATE UNI R MOD SED  12/25/2017  . IR ANGIO VERTEBRAL SEL VERTEBRAL UNI L MOD SED  03/20/2017  . IR ANGIO VERTEBRAL SEL VERTEBRAL UNI L MOD SED  12/25/2017  . IR PTA INTRACRANIAL  03/26/2017  . IR RADIOLOGIST EVAL & MGMT  04/14/2017  . IR RADIOLOGIST EVAL & MGMT  09/24/2017  . RADIOLOGY WITH ANESTHESIA N/A 03/26/2017   Procedure: RADIOLOGY WITH ANESTHESIA STENTING;  Surgeon: [name], MD;  Location: [facility];  Service: Radiology;  Laterality: N/A;  . VENTRICULOPERITONEAL SHUNT      Social History   Socioeconomic History  . Marital status: Divorced    Spouse name: Not on file  . Number of children: Not on file  . Years of education: Not on file  . Highest education level: Not on file  Occupational History  . Not on file  Social Needs  . Financial resource strain: Not on file  . Food insecurity:    Worry: Not on file    Inability: Not on file  . Transportation needs:    Medical: Not on file    Non-medical: Not on file  Tobacco Use  . Smoking status: Former Smoker    Last attempt to quit: 09/17/1971    Years since quitting: 46.9  . Smokeless tobacco: Never Used  Substance and Sexual Activity  . Alcohol use: No  . Drug use: No  . Sexual activity: Not on file  Lifestyle  . Physical activity:    Days per week: Not on file    Minutes per session: Not on file  . Stress: Not on file  Relationships  . Social connections:    Talks on phone: Not on file    Gets together: Not on file    Attends religious service: Not on file    Active member of club or organization: Not on file    Attends meetings of clubs or organizations: Not on file    Relationship status: Not on file  . Intimate partner violence:    Fear of current or ex partner: Not on file    Emotionally abused: Not on file    Physically abused: Not on file    Forced sexual activity: Not on file  Other Topics Concern  .  Not on file  Social History Narrative  . Not on file  Family History  Problem Relation Age of Onset  . Heart attack Father     Outpatient Encounter Medications as of 08/18/2018  Medication Sig  . acetaminophen (TYLENOL) 500 MG tablet Take 500-1,000 mg by mouth every 6 (six) hours as needed for headache (pain).   Marland Kitchen aspirin EC 81 MG EC tablet Take 1 tablet (81 mg total) by mouth daily.  . calcium carbonate (TUMS - DOSED IN MG ELEMENTAL CALCIUM) 500 MG chewable tablet Chew 1-2 tablets by mouth daily as needed for indigestion or heartburn.  . Cholecalciferol (VITAMIN D3) 5000 units CAPS Take 5,000 Units by mouth daily.   . clonazePAM (KLONOPIN) 1 MG tablet Take 1 tablet (1 mg total) by mouth at bedtime as needed (anxiety, insomnia). (Patient taking differently: Take 1 mg 2 (two) times daily as needed by mouth (anxiety, insomnia). )  . Cyanocobalamin (B-12 COMPLIANCE INJECTION IJ) Inject 1,000 Units as directed. Every 1 to 2 weeks  . diclofenac sodium (VOLTAREN) 1 % GEL Apply 4 g 4 (four) times daily topically. (Patient taking differently: Apply 4 g topically 4 (four) times daily as needed (pain). )  . ECHINACEA-GOLDEN SEAL PO Take 1 capsule by mouth 2 (two) times daily.   Marland Kitchen estradiol cypionate (DEPO-ESTRADIOL) 5 MG/ML injection Inject into the muscle.  . ezetimibe (ZETIA) 10 MG tablet Take 1 tablet (10 mg total) by mouth daily.  . fluticasone (FLONASE) 50 MCG/ACT nasal spray Place 2 sprays into both nostrils daily. (Patient taking differently: Place 2 sprays daily as needed into both nostrils for allergies. )  . levothyroxine (SYNTHROID, LEVOTHROID) 50 MCG tablet Take 1 tablet (50 mcg total) by mouth daily. NEED FOLLOW UP VISIT FOR MORE REFILLS  . losartan (COZAAR) 50 MG tablet Take 1 tablet (50 mg total) by mouth daily. (Patient taking differently: Take 25 mg by mouth daily. )  . Miconazole Nitrate 2 % POWD Apply 1 application topically daily as needed (skin irritations).   . Omega-3 Fatty  Acids (FISH OIL PO) Take 2,080 mg by mouth daily.  . ondansetron (ZOFRAN) 4 MG tablet Take 4 mg by mouth every 8 (eight) hours as needed for nausea or vomiting.  . rosuvastatin (CRESTOR) 40 MG tablet Take 1 tablet (40 mg total) by mouth daily at 6 PM.  . senna-docusate (SENOKOT-S) 8.6-50 MG tablet Take 1-2 tablets by mouth daily.  . traMADol (ULTRAM) 50 MG tablet Take 1 tablet (50 mg total) every 12 (twelve) hours as needed by mouth (knee pain). (Patient taking differently: Take 25-50 mg by mouth daily as needed (knee pain). )  . VITAMIN A PO Take 2,400 Units by mouth daily.  . vitamin C (ASCORBIC ACID) 500 MG tablet Take 500 mg by mouth daily.  . [DISCONTINUED] acetic acid-hydrocortisone (VOSOL-HC) otic solution Place 3 drops into both ears 3 (three) times daily. (Patient taking differently: Place 3 drops into both ears 3 (three) times daily as needed (itching). )  . [DISCONTINUED] DEPO-ESTRADIOL 5 MG/ML injection Inject 2.5 mg as directed. Every 2 to 3 weeks  . [DISCONTINUED] diphenhydrAMINE (BENADRYL) 25 mg capsule Take 25 mg daily as needed by mouth for allergies.  . [DISCONTINUED] docusate sodium (COLACE) 100 MG capsule Take 1 capsule (100 mg total) by mouth 2 (two) times daily as needed for mild constipation. (Patient not taking: Reported on 12/24/2017)  . [DISCONTINUED] guaiFENesin (MUCINEX) 600 MG 12 hr tablet Take 600 mg by mouth 2 (two) times daily as needed for cough or to loosen phlegm.  . [DISCONTINUED] ketoconazole (NIZORAL) 2 %  shampoo Use to shampoo hair up to twice a week as needed for itching  . [DISCONTINUED] Liniments (SALONPAS PAIN RELIEF PATCH EX) Apply 1 patch topically daily as needed (pain).  . [DISCONTINUED] metoprolol tartrate (LOPRESSOR) 25 MG tablet Take 1 tablet (25 mg total) by mouth daily. (Patient taking differently: Take 12.5 mg by mouth daily as needed (if heart rate goes up). )  . [DISCONTINUED] pantoprazole (PROTONIX) 40 MG tablet Take 40 mg by mouth at bedtime.    . [DISCONTINUED] QVAR 40 MCG/ACT inhaler USE 2 INHALATIONS TWICE A DAY (Patient taking differently: USE 2 INHALATIONS TWICE A DAY AS NEEDED FOR SHORTNESS OF BREATH/WHEEZING)  . [DISCONTINUED] ticagrelor (BRILINTA) 90 MG TABS tablet Take 1 tablet (90 mg total) by mouth 2 (two) times daily. [place].LAST REFILL. MUST SCHEDULE AN APPOINTMENT FOR REFILLS   No facility-administered encounter medications on file as of 08/18/2018.        Objective:    General: Well Developed, well nourished, and in no acute distress.  Neuro: Alert and oriented x3, extra-ocular muscles intact, sensation grossly intact.  HEENT: Normocephalic, atraumatic  Skin: Warm and dry, no rashes. Cardiac: Regular rate and rhythm, no murmurs rubs or gallops, no lower extremity edema.  Respiratory: Clear to auscultation bilaterally. Not using accessory muscles, speaking in full sentences. Chest: In her right breast about half a centimeter from the nipple around the 2 o'clock position she has a very firm round feeling almost but not like nodule.  Its slightly less than a centimeter in size.  She feels like it has been there.  She does have some incisional scars underneath both breasts from prior history of breast reduction.  And an old scar right around the outer edge of the nipple on the right breast where she had a biopsy performed.   Impression and Recommendations:    HTN -pressures are elevated.  But looking back at the notes from the home visits it looks like her blood pressures overall were pretty well controlled.  Hyperlipidemia  - she thinks had labs done.  Will stop the zetia and continue Crestor for now.    Insomnia/anxiety-we did warn about the long-term effects of benzodiazepines and that she really needs to work on trying to wean off of the medication and at least weaning down to more PRN use versus daily use.  She says most days she just takes 1-2 tabs even though her current prescription per her report  is written for 3 times a day.  Hypothyroidism-I would like to have a look at the labs she had drawn recently to make sure that they have checked her thyroid levels.  Breast lump on the right-she feels is most consistent with her previous scar tissue and does not want any additional work-up.  Changes her mind I am happy to refer her for further evaluation.   Note: She also was requesting that I would be willing to prescribe her medications for 1 year while she goes to [place] for a year.  I explained that that is not good care and that I would not be willing to do that.  I do not mind providing up to 6 months to give her a chance to find a provider in that area who would be able to monitor her.  Her medical problems including her medications need to be monitored with labs and and OV.    Time spent 50 min, greater than 50% of time spent face-to-face discussing her blood pressure, cholesterol medications,  insomnia, anxiety, thyroid, and breast lump.

## 2018-08-19 ENCOUNTER — Encounter: Payer: Self-pay | Admitting: Family Medicine

## 2018-10-24 DIAGNOSIS — Z8673 Personal history of transient ischemic attack (TIA), and cerebral infarction without residual deficits: Secondary | ICD-10-CM | POA: Diagnosis not present

## 2018-10-24 DIAGNOSIS — I10 Essential (primary) hypertension: Secondary | ICD-10-CM | POA: Diagnosis not present

## 2018-10-24 DIAGNOSIS — R7303 Prediabetes: Secondary | ICD-10-CM | POA: Diagnosis not present

## 2018-10-24 DIAGNOSIS — Z0000 Encounter for general adult medical examination without abnormal findings: Secondary | ICD-10-CM | POA: Diagnosis not present

## 2018-10-24 DIAGNOSIS — Z7189 Other specified counseling: Secondary | ICD-10-CM | POA: Diagnosis not present

## 2018-10-24 DIAGNOSIS — E785 Hyperlipidemia, unspecified: Secondary | ICD-10-CM | POA: Diagnosis not present

## 2018-10-24 DIAGNOSIS — F411 Generalized anxiety disorder: Secondary | ICD-10-CM | POA: Diagnosis not present

## 2018-10-24 DIAGNOSIS — E039 Hypothyroidism, unspecified: Secondary | ICD-10-CM | POA: Diagnosis not present

## 2018-11-11 ENCOUNTER — Telehealth (HOSPITAL_COMMUNITY): Payer: Self-pay

## 2018-11-11 NOTE — Telephone Encounter (Signed)
Returned pt's call, left vm. AW

## 2018-12-23 DIAGNOSIS — F411 Generalized anxiety disorder: Secondary | ICD-10-CM | POA: Diagnosis not present

## 2018-12-23 DIAGNOSIS — J209 Acute bronchitis, unspecified: Secondary | ICD-10-CM | POA: Diagnosis not present

## 2018-12-23 DIAGNOSIS — Z7902 Long term (current) use of antithrombotics/antiplatelets: Secondary | ICD-10-CM | POA: Diagnosis not present

## 2019-01-07 DIAGNOSIS — R5383 Other fatigue: Secondary | ICD-10-CM | POA: Diagnosis not present

## 2019-01-07 DIAGNOSIS — D509 Iron deficiency anemia, unspecified: Secondary | ICD-10-CM | POA: Diagnosis not present

## 2019-01-07 DIAGNOSIS — E7800 Pure hypercholesterolemia, unspecified: Secondary | ICD-10-CM | POA: Diagnosis not present

## 2019-01-07 DIAGNOSIS — N390 Urinary tract infection, site not specified: Secondary | ICD-10-CM | POA: Diagnosis not present

## 2019-01-11 DIAGNOSIS — G44209 Tension-type headache, unspecified, not intractable: Secondary | ICD-10-CM | POA: Diagnosis not present

## 2019-01-11 DIAGNOSIS — E7800 Pure hypercholesterolemia, unspecified: Secondary | ICD-10-CM | POA: Diagnosis not present

## 2019-01-11 DIAGNOSIS — J45909 Unspecified asthma, uncomplicated: Secondary | ICD-10-CM | POA: Diagnosis not present

## 2019-01-11 DIAGNOSIS — I10 Essential (primary) hypertension: Secondary | ICD-10-CM | POA: Diagnosis not present

## 2019-01-11 DIAGNOSIS — J301 Allergic rhinitis due to pollen: Secondary | ICD-10-CM | POA: Diagnosis not present

## 2019-02-02 ENCOUNTER — Telehealth (HOSPITAL_COMMUNITY): Payer: Self-pay

## 2019-02-02 NOTE — Telephone Encounter (Signed)
Called pt to schedule f/u cta, no answer, left vm. AW

## 2019-02-04 DIAGNOSIS — E039 Hypothyroidism, unspecified: Secondary | ICD-10-CM | POA: Diagnosis not present

## 2019-02-04 DIAGNOSIS — F411 Generalized anxiety disorder: Secondary | ICD-10-CM | POA: Diagnosis not present

## 2019-02-04 DIAGNOSIS — Z7189 Other specified counseling: Secondary | ICD-10-CM | POA: Diagnosis not present

## 2019-02-04 DIAGNOSIS — Z7902 Long term (current) use of antithrombotics/antiplatelets: Secondary | ICD-10-CM | POA: Diagnosis not present

## 2019-02-04 DIAGNOSIS — I6523 Occlusion and stenosis of bilateral carotid arteries: Secondary | ICD-10-CM | POA: Diagnosis not present

## 2019-02-04 DIAGNOSIS — R490 Dysphonia: Secondary | ICD-10-CM | POA: Diagnosis not present

## 2019-02-09 ENCOUNTER — Telehealth (HOSPITAL_COMMUNITY): Payer: Self-pay

## 2019-02-09 NOTE — Telephone Encounter (Signed)
Called to schedule cta head/neck, no answer, left vm. AW 

## 2019-02-15 ENCOUNTER — Telehealth (HOSPITAL_COMMUNITY): Payer: Self-pay

## 2019-02-15 ENCOUNTER — Other Ambulatory Visit (HOSPITAL_COMMUNITY): Payer: Self-pay | Admitting: Interventional Radiology

## 2019-02-15 DIAGNOSIS — I771 Stricture of artery: Secondary | ICD-10-CM

## 2019-02-15 DIAGNOSIS — R2689 Other abnormalities of gait and mobility: Secondary | ICD-10-CM

## 2019-02-15 NOTE — Telephone Encounter (Signed)
Called to schedule cta head/neck. Pt does not want to come w/o daughter. Will wait and schedule when visitors are allowed. She will call the schedule if she starts to have sx and needs to come in sooner. AW

## 2019-02-21 ENCOUNTER — Other Ambulatory Visit: Payer: Self-pay

## 2019-02-21 ENCOUNTER — Emergency Department (HOSPITAL_COMMUNITY): Payer: Medicare Other

## 2019-02-21 ENCOUNTER — Emergency Department (HOSPITAL_COMMUNITY)
Admission: EM | Admit: 2019-02-21 | Discharge: 2019-02-22 | Disposition: A | Discharge status: Home / Self Care | Payer: Medicare Other | Attending: Emergency Medicine | Admitting: Emergency Medicine

## 2019-02-21 DIAGNOSIS — I10 Essential (primary) hypertension: Secondary | ICD-10-CM | POA: Insufficient documentation

## 2019-02-21 DIAGNOSIS — E039 Hypothyroidism, unspecified: Secondary | ICD-10-CM | POA: Insufficient documentation

## 2019-02-21 DIAGNOSIS — J45909 Unspecified asthma, uncomplicated: Secondary | ICD-10-CM | POA: Diagnosis not present

## 2019-02-21 DIAGNOSIS — R5381 Other malaise: Secondary | ICD-10-CM | POA: Diagnosis not present

## 2019-02-21 DIAGNOSIS — R0602 Shortness of breath: Secondary | ICD-10-CM | POA: Diagnosis not present

## 2019-02-21 DIAGNOSIS — Z8673 Personal history of transient ischemic attack (TIA), and cerebral infarction without residual deficits: Secondary | ICD-10-CM | POA: Diagnosis not present

## 2019-02-21 DIAGNOSIS — R200 Anesthesia of skin: Secondary | ICD-10-CM

## 2019-02-21 DIAGNOSIS — Z79899 Other long term (current) drug therapy: Secondary | ICD-10-CM | POA: Insufficient documentation

## 2019-02-21 DIAGNOSIS — R202 Paresthesia of skin: Secondary | ICD-10-CM | POA: Diagnosis not present

## 2019-02-21 DIAGNOSIS — Z7982 Long term (current) use of aspirin: Secondary | ICD-10-CM | POA: Diagnosis not present

## 2019-02-21 DIAGNOSIS — Z87891 Personal history of nicotine dependence: Secondary | ICD-10-CM | POA: Diagnosis not present

## 2019-02-21 DIAGNOSIS — I63211 Cerebral infarction due to unspecified occlusion or stenosis of right vertebral arteries: Secondary | ICD-10-CM | POA: Diagnosis not present

## 2019-02-21 DIAGNOSIS — I63233 Cerebral infarction due to unspecified occlusion or stenosis of bilateral carotid arteries: Secondary | ICD-10-CM | POA: Diagnosis not present

## 2019-02-21 LAB — URINALYSIS, ROUTINE W REFLEX MICROSCOPIC
Bilirubin Urine: NEGATIVE
Glucose, UA: NEGATIVE mg/dL
Ketones, ur: NEGATIVE mg/dL
Leukocytes,Ua: NEGATIVE
Nitrite: NEGATIVE
Protein, ur: NEGATIVE mg/dL
Specific Gravity, Urine: 1.004 — ABNORMAL LOW (ref 1.005–1.030)
pH: 6 (ref 5.0–8.0)

## 2019-02-21 LAB — CBC
HCT: 45.6 % (ref 36.0–46.0)
Hemoglobin: 15.2 g/dL — ABNORMAL HIGH (ref 12.0–15.0)
MCH: 31.3 pg (ref 26.0–34.0)
MCHC: 33.3 g/dL (ref 30.0–36.0)
MCV: 94 fL (ref 80.0–100.0)
Platelets: 250 10*3/uL (ref 150–400)
RBC: 4.85 MIL/uL (ref 3.87–5.11)
RDW: 13.4 % (ref 11.5–15.5)
WBC: 8.6 10*3/uL (ref 4.0–10.5)
nRBC: 0 % (ref 0.0–0.2)

## 2019-02-21 LAB — DIFFERENTIAL
Abs Immature Granulocytes: 0.02 10*3/uL (ref 0.00–0.07)
Basophils Absolute: 0.1 10*3/uL (ref 0.0–0.1)
Basophils Relative: 1 %
Eosinophils Absolute: 0.2 10*3/uL (ref 0.0–0.5)
Eosinophils Relative: 2 %
Immature Granulocytes: 0 %
Lymphocytes Relative: 21 %
Lymphs Abs: 1.8 10*3/uL (ref 0.7–4.0)
Monocytes Absolute: 0.7 10*3/uL (ref 0.1–1.0)
Monocytes Relative: 8 %
Neutro Abs: 5.8 10*3/uL (ref 1.7–7.7)
Neutrophils Relative %: 68 %

## 2019-02-21 LAB — COMPREHENSIVE METABOLIC PANEL
ALT: 21 U/L (ref 0–44)
AST: 23 U/L (ref 15–41)
Albumin: 4 g/dL (ref 3.5–5.0)
Alkaline Phosphatase: 42 U/L (ref 38–126)
Anion gap: 9 (ref 5–15)
BUN: 14 mg/dL (ref 8–23)
CO2: 25 mmol/L (ref 22–32)
Calcium: 9.2 mg/dL (ref 8.9–10.3)
Chloride: 101 mmol/L (ref 98–111)
Creatinine, Ser: 0.84 mg/dL (ref 0.44–1.00)
GFR calc Af Amer: >60 mL/min (ref >60)
GFR calc non Af Amer: >60 mL/min (ref >60)
Glucose, Bld: 109 mg/dL — ABNORMAL HIGH (ref 70–99)
Potassium: 3.9 mmol/L (ref 3.5–5.1)
Sodium: 135 mmol/L (ref 135–145)
Total Bilirubin: 0.5 mg/dL (ref 0.3–1.2)
Total Protein: 7 g/dL (ref 6.5–8.1)

## 2019-02-21 MED ORDER — SODIUM CHLORIDE (PF) 0.9 % IJ SOLN
INTRAMUSCULAR | Status: AC
  Administered 2019-02-22: 01:00:00
  Filled 2019-02-21: qty 50

## 2019-02-21 NOTE — ED Provider Notes (Signed)
Plan to get CTA head and neck, d/w neuro about results, may need start brlinta Pt with h/o previous CVA, presents with numbness   Ripley Fraise, MD 02/21/19 2351

## 2019-02-21 NOTE — ED Triage Notes (Addendum)
Pt comes from hotel, c/o left sided numbness from shoulder to feet. Negative stroke screen. , states she has had 11 strokes in her life. V/s on arrival 170/84, hr 77, spo2 95 , cbg 113, temp 97.9.

## 2019-02-21 NOTE — ED Notes (Signed)
Bed: WA10 Expected date:  Expected time:  Means of arrival:  Comments: EMS 

## 2019-02-22 ENCOUNTER — Emergency Department (HOSPITAL_COMMUNITY): Payer: Medicare Other

## 2019-02-22 ENCOUNTER — Encounter (HOSPITAL_COMMUNITY): Payer: Self-pay

## 2019-02-22 ENCOUNTER — Other Ambulatory Visit (HOSPITAL_COMMUNITY): Payer: Medicare Other

## 2019-02-22 DIAGNOSIS — R200 Anesthesia of skin: Secondary | ICD-10-CM | POA: Diagnosis not present

## 2019-02-22 MED ORDER — IOHEXOL 350 MG/ML SOLN
100.0000 mL | Freq: Once | INTRAVENOUS | Status: AC | PRN
  Administered 2019-02-22: 100 mL via INTRAVENOUS

## 2019-02-22 NOTE — ED Provider Notes (Signed)
CT scan is negative for acute change Patient has been ambulatory.  No arm or leg drift.  Speech is fluent.  No facial droop Discussed the case with on-call neurology Dr. Rory Percy, he is reviewed imaging, no new meds, she can follow-up as an outpatient Strict return precautions with patient   Ripley Fraise, MD 02/22/19 [phone]

## 2019-02-22 NOTE — Discharge Instructions (Addendum)

## 2019-02-22 NOTE — ED Notes (Signed)
Pt ambulatory to the bathroom with steady gait and no assistance

## 2019-02-22 NOTE — ED Provider Notes (Signed)
[facility] DEPT Provider Note   CSN: [id number] Arrival date & time: 02/21/19  2016    History   Chief Complaint Chief Complaint  Patient presents with   Numbness    leftsided from sholders to feet started last night     HPI Kathleen Dunn is a 78 y.o. female.     78 year old female with extensive past medical history below including CVA, hypertension, thyroid disease who presents with numbness.  Last night, the patient began having numbness in her foot that progressed to her L side including leg, arm, and face.  When she woke up this morning, symptoms had mildly improved but were still present.  She later spoke with her PCP who encouraged her to be evaluated.  She states that the numbness is mild currently but still present.  She has not noticed any weakness, vision changes, headache, or other new symptoms. She is compliant with medications. She is scheduled to have repeat CTA soon due to basilar artery stenosis.  The history is provided by the patient.    Past Medical History:  Diagnosis Date   Asthma    Hypertension    Lung nodule < 6cm on CT    Stroke [facility])    Thyroid disease     Patient Active Problem List   Diagnosis Date Noted   Unsteady gait 08/18/2018   Tremor 08/18/2018   Nausea 08/18/2018   History of fall 08/18/2018   Dizziness 08/18/2018   Disorder of vision 08/18/2018   Disorder of lung 08/18/2018   Diarrhea 08/18/2018   Chronic constipation 08/18/2018   Common cold 08/18/2018   Blister of lip without infection 08/18/2018   Acute bronchitis 08/18/2018   Breast lump on right side at 1 o'clock position 03/23/2018   Breast pain 03/23/2018   Primary insomnia 05/06/2017   Essential tremor 05/06/2017   History of cerebellar stroke 05/06/2017   Basilar artery stenosis/occlusion with infarction ([place]) 03/26/2017   Cerebrovascular accident (CVA) due to thrombosis of precerebral artery ([place])    Mixed  hyperlipidemia    Basilar artery stenosis    Bilateral carotid artery stenosis    Cerebellar stroke ([place]) 03/15/2017   Patellofemoral arthritis 11/20/2016   Hypokalemia 04/05/2016   Near syncope 04/04/2016   GAD (generalized anxiety disorder) 11/06/2015   Coronary artery calcification 02/17/2015   IFG (impaired fasting glucose) 01/12/2015   Lung nodule < 6cm on CT 05/20/2014   Obesity (BMI 30.0-34.9) 08/26/2013   Chronic fatigue 02/18/2013   DEPRESSION 01/08/2008   FATIGUE 01/08/2008   Hypothyroidism 12/04/2007   HYPONATREMIA 12/04/2007   ABNORMAL MAMMOGRAM 08/25/2007   HYPERCHOLESTEROLEMIA 06/24/2006   PERNICIOUS ANEMIA 06/24/2006   HYPERTENSION, BENIGN SYSTEMIC 06/24/2006   LOW BACK PAIN 06/24/2006   TREMOR 06/24/2006    Past Surgical History:  Procedure Laterality Date   ABDOMINAL HYSTERECTOMY  age 60   oophorectomy   BREAST SURGERY     benign bx of left breast   IR ANGIO INTRA EXTRACRAN SEL COM CAROTID INNOMINATE BILAT MOD SED  03/20/2017   IR ANGIO INTRA EXTRACRAN SEL COM CAROTID INNOMINATE BILAT MOD SED  12/25/2017   IR ANGIO VERTEBRAL SEL SUBCLAVIAN INNOMINATE UNI R MOD SED  03/20/2017   IR ANGIO VERTEBRAL SEL SUBCLAVIAN INNOMINATE UNI R MOD SED  12/25/2017   IR ANGIO VERTEBRAL SEL VERTEBRAL UNI L MOD SED  03/20/2017   IR ANGIO VERTEBRAL SEL VERTEBRAL UNI L MOD SED  12/25/2017   IR PTA INTRACRANIAL  03/26/2017   IR RADIOLOGIST  EVAL & MGMT  04/14/2017   IR RADIOLOGIST EVAL & MGMT  09/24/2017   RADIOLOGY WITH ANESTHESIA N/A 03/26/2017   Procedure: RADIOLOGY WITH ANESTHESIA STENTING;  Surgeon: [name], MD;  Location: [facility];  Service: Radiology;  Laterality: N/A;   VENTRICULOPERITONEAL SHUNT       OB History   No obstetric history on file.      Home Medications    Prior to Admission medications   Medication Sig Start Date End Date Taking? Authorizing Provider  acetaminophen (TYLENOL) 500 MG tablet Take 500-1,000 mg by mouth  every 6 (six) hours as needed for headache (pain).     [provider]  aspirin EC 81 MG EC tablet Take 1 tablet (81 mg total) by mouth daily. 04/02/17   Mariel Aloe, MD  calcium carbonate (TUMS - DOSED IN MG ELEMENTAL CALCIUM) 500 MG chewable tablet Chew 1-2 tablets by mouth daily as needed for indigestion or heartburn.    [provider]  Cholecalciferol (VITAMIN D3) 5000 units CAPS Take 5,000 Units by mouth daily.     [provider]  clonazePAM (KLONOPIN) 1 MG tablet Take 1 tablet (1 mg total) by mouth at bedtime as needed (anxiety, insomnia). Patient taking differently: Take 1 mg 2 (two) times daily as needed by mouth (anxiety, insomnia).  05/06/17   Hali Marry, MD  Cyanocobalamin (B-12 COMPLIANCE INJECTION IJ) Inject 1,000 Units as directed. Every 1 to 2 weeks    [provider]  diclofenac sodium (VOLTAREN) 1 % GEL Apply 4 g 4 (four) times daily topically. Patient taking differently: Apply 4 g topically 4 (four) times daily as needed (pain).  08/01/17   Hali Marry, MD  ECHINACEA-GOLDEN SEAL PO Take 1 capsule by mouth 2 (two) times daily.     [provider]  estradiol cypionate (DEPO-ESTRADIOL) 5 MG/ML injection Inject into the muscle.    [provider]  ezetimibe (ZETIA) 10 MG tablet Take 1 tablet (10 mg total) by mouth daily. 05/23/17   Hali Marry, MD  fluticasone (FLONASE) 50 MCG/ACT nasal spray Place 2 sprays into both nostrils daily. Patient taking differently: Place 2 sprays daily as needed into both nostrils for allergies.  07/08/17   Hali Marry, MD  levothyroxine (SYNTHROID, LEVOTHROID) 50 MCG tablet Take 1 tablet (50 mcg total) by mouth daily. NEED FOLLOW UP VISIT FOR MORE REFILLS 08/06/16   Hali Marry, MD  losartan (COZAAR) 50 MG tablet Take 1 tablet (50 mg total) by mouth daily. Patient taking differently: Take 25 mg by mouth daily.  05/23/17   Hali Marry, MD    Miconazole Nitrate 2 % POWD Apply 1 application topically daily as needed (skin irritations).     [provider]  Omega-3 Fatty Acids (FISH OIL PO) Take 2,080 mg by mouth daily.    [provider]  ondansetron (ZOFRAN) 4 MG tablet Take 4 mg by mouth every 8 (eight) hours as needed for nausea or vomiting.    [provider]  rosuvastatin (CRESTOR) 40 MG tablet Take 1 tablet (40 mg total) by mouth daily at 6 PM. 05/23/17   Hali Marry, MD  senna-docusate (SENOKOT-S) 8.6-50 MG tablet Take 1-2 tablets by mouth daily.    [provider]  traMADol (ULTRAM) 50 MG tablet Take 1 tablet (50 mg total) every 12 (twelve) hours as needed by mouth (knee pain). Patient taking differently: Take 25-50 mg by mouth daily as needed (knee pain).  07/31/17  Hali Marry, MD  VITAMIN A PO Take 2,400 Units by mouth daily.    [provider]  vitamin C (ASCORBIC ACID) 500 MG tablet Take 500 mg by mouth daily.    [provider]    Family History Family History  Problem Relation Age of Onset   Heart attack Father     Social History Social History   Tobacco Use   Smoking status: Former Smoker    Last attempt to quit: 09/17/1971    Years since quitting: 47.4   Smokeless tobacco: Never Used  Substance Use Topics   Alcohol use: No   Drug use: No     Allergies   Codeine; Hydrocodone-acetaminophen; Morphine; Morphine and related; Tetracycline; Lipitor [atorvastatin]; Penicillins; Maxitrol [neomycin-polymyxin-dexameth]; Albuterol; Erythromycin; Hydroxyzine pamoate; Iodine; Simvastatin; and Sulfonamide derivatives   Review of Systems Review of Systems All other systems reviewed and are negative except that which was mentioned in HPI   Physical Exam Updated Vital Signs BP 111/66    Pulse (!) 53    Temp 98 F (36.7 C) (Oral)    Resp 17    Ht 5\' 1"  (1.549 m)    Wt 76.7 kg    SpO2 94%    BMI 31.93 kg/m   Physical Exam Vitals signs  and nursing note reviewed.  Constitutional:      General: She is not in acute distress.    Appearance: She is well-developed.     Comments: Awake, alert  HENT:     Head: Normocephalic and atraumatic.  Eyes:     Extraocular Movements: Extraocular movements intact.     Conjunctiva/sclera: Conjunctivae normal.     Pupils: Pupils are equal, round, and reactive to light.  Neck:     Musculoskeletal: Neck supple.  Cardiovascular:     Rate and Rhythm: Normal rate and regular rhythm.     Heart sounds: Normal heart sounds. No murmur.  Pulmonary:     Effort: Pulmonary effort is normal. No respiratory distress.     Breath sounds: Normal breath sounds.  Abdominal:     General: Bowel sounds are normal. There is no distension.     Palpations: Abdomen is soft.     Tenderness: There is no abdominal tenderness.  Skin:    General: Skin is warm and dry.  Neurological:     Mental Status: She is alert and oriented to person, place, and time.     Cranial Nerves: No cranial nerve deficit.     Motor: No abnormal muscle tone.     Deep Tendon Reflexes: Reflexes are normal and symmetric.     Comments: Fluent speech, normal finger-to-nose testing, negative pronator drift, no clonus 5/5 strength x all 4 extremities Mildly decreased sensation on left face, left upper and lower extremities  Psychiatric:        Thought Content: Thought content normal.        Judgment: Judgment normal.      ED Treatments / Results  Labs (all labs ordered are listed, but only abnormal results are displayed) Labs Reviewed  CBC - Abnormal; Notable for the following components:      Result Value   Hemoglobin 15.2 (*)    All other components within normal limits  COMPREHENSIVE METABOLIC PANEL - Abnormal; Notable for the following components:   Glucose, Bld 109 (*)    All other components within normal limits  URINALYSIS, ROUTINE W REFLEX MICROSCOPIC - Abnormal; Notable for the following components:   Color, Urine STRAW  (*)  Specific Gravity, Urine 1.004 (*)    Hgb urine dipstick SMALL (*)    Bacteria, UA RARE (*)    All other components within normal limits  DIFFERENTIAL    EKG EKG Interpretation  Date/Time:  Sunday February 21 2019 21:39:28 EDT Ventricular Rate:  65 PR Interval:    QRS Duration: 90 QT Interval:  419 QTC Calculation: 436 R Axis:   51 Text Interpretation:  Sinus rhythm Low voltage, precordial leads Minimal ST elevation, inferior leads Baseline wander in lead(s) II III aVF No significant change since last tracing Confirmed by Theotis Burrow [phone]) on 02/21/2019 10:08:43 PM   Radiology Ct Head Wo Contrast  Result Date: 02/21/2019 CLINICAL DATA:  78 year old female with left side numbness, multiple prior strokes. History of angioplastied basilar artery, right vertebral artery occlusion, right ICA stenosis. EXAM: CT HEAD WITHOUT CONTRAST TECHNIQUE: Contiguous axial images were obtained from the base of the skull through the vertex without intravenous contrast. COMPARISON:  CTA head and neck 07/30/2018 and earlier. FINDINGS: Brain: Stable cerebral volume. No midline shift, mass effect, or evidence of intracranial mass lesion. No ventriculomegaly. Small chronic cerebellar infarcts. Chronic cerebral white matter and right deep gray matter heterogeneity. Stable mild cortical encephalomalacia in the left occipital pole. No midline shift, ventriculomegaly, mass effect, evidence of mass lesion, intracranial hemorrhage or evidence of cortically based acute infarction. Vascular: Calcified atherosclerosis at the skull base. No suspicious intracranial vascular hyperdensity. Skull: No acute osseous abnormality identified. Sinuses/Orbits: Visualized paranasal sinuses and mastoids are stable and well pneumatized. Other: No acute orbit or scalp soft tissue finding. IMPRESSION: 1. No acute intracranial abnormality. 2. Stable non contrast CT appearance of chronic small and medium-sized vessel ischemic disease since  2019. Electronically Signed   By: Genevie Ann M.D.   On: 02/21/2019 22:43    Procedures Procedures (including critical care time)  Medications Ordered in ED Medications  sodium chloride (PF) 0.9 % injection (has no administration in time range)  iohexol (OMNIPAQUE) 350 MG/ML injection 100 mL (100 mLs Intravenous Contrast Given 02/22/19 0014)     Initial Impression / Assessment and Plan / ED Course  I have reviewed the triage vital signs and the nursing notes.  Pertinent labs & imaging results that were available during my care of the patient were reviewed by me and considered in my medical decision making (see chart for details).       Shin neurologically intact on exam aside from subjective decreased sensation on left side.  No other neurologic deficits noted.  Symptom onset was yesterday therefore outside of any TPA window.  Head CT negative acute.  Screening lab work unremarkable.  Given her extensive neurologic history, I discussed with neurologist Dr. Rory Percy, who recommended CTA of head and neck.  She is currently on aspirin only, not on antiplatelet medication.  I am signing out to oncoming provider pending imaging results and repeat discussion with neurology. Final Clinical Impressions(s) / ED Diagnoses   Final diagnoses:  Left sided numbness    ED Discharge Orders    None       Naleigha Raimondi, Wenda Overland, MD 02/22/19 [phone]

## 2019-03-10 ENCOUNTER — Telehealth (HOSPITAL_COMMUNITY): Payer: Self-pay

## 2019-03-10 NOTE — Telephone Encounter (Signed)
Called pt to schedule mri, no answer, left vm. AW

## 2019-03-17 DIAGNOSIS — I10 Essential (primary) hypertension: Secondary | ICD-10-CM | POA: Diagnosis not present

## 2019-03-17 DIAGNOSIS — W19XXXA Unspecified fall, initial encounter: Secondary | ICD-10-CM | POA: Diagnosis not present

## 2019-03-17 DIAGNOSIS — R110 Nausea: Secondary | ICD-10-CM | POA: Diagnosis not present

## 2019-03-17 DIAGNOSIS — R531 Weakness: Secondary | ICD-10-CM | POA: Diagnosis not present

## 2019-03-18 ENCOUNTER — Emergency Department (HOSPITAL_COMMUNITY)
Admission: EM | Admit: 2019-03-18 | Discharge: 2019-03-18 | Disposition: A | Discharge status: Home / Self Care | Payer: Medicare Other | Attending: Emergency Medicine | Admitting: Emergency Medicine

## 2019-03-18 ENCOUNTER — Emergency Department (HOSPITAL_COMMUNITY): Payer: Medicare Other

## 2019-03-18 ENCOUNTER — Other Ambulatory Visit: Payer: Self-pay

## 2019-03-18 DIAGNOSIS — R4182 Altered mental status, unspecified: Secondary | ICD-10-CM | POA: Diagnosis not present

## 2019-03-18 DIAGNOSIS — I10 Essential (primary) hypertension: Secondary | ICD-10-CM | POA: Diagnosis not present

## 2019-03-18 DIAGNOSIS — Z8673 Personal history of transient ischemic attack (TIA), and cerebral infarction without residual deficits: Secondary | ICD-10-CM | POA: Insufficient documentation

## 2019-03-18 DIAGNOSIS — R531 Weakness: Secondary | ICD-10-CM | POA: Insufficient documentation

## 2019-03-18 DIAGNOSIS — W01198A Fall on same level from slipping, tripping and stumbling with subsequent striking against other object, initial encounter: Secondary | ICD-10-CM | POA: Diagnosis not present

## 2019-03-18 DIAGNOSIS — J45909 Unspecified asthma, uncomplicated: Secondary | ICD-10-CM | POA: Insufficient documentation

## 2019-03-18 DIAGNOSIS — E039 Hypothyroidism, unspecified: Secondary | ICD-10-CM | POA: Diagnosis not present

## 2019-03-18 DIAGNOSIS — M79601 Pain in right arm: Secondary | ICD-10-CM | POA: Diagnosis not present

## 2019-03-18 DIAGNOSIS — W19XXXA Unspecified fall, initial encounter: Secondary | ICD-10-CM

## 2019-03-18 DIAGNOSIS — Z87891 Personal history of nicotine dependence: Secondary | ICD-10-CM | POA: Insufficient documentation

## 2019-03-18 DIAGNOSIS — R51 Headache: Secondary | ICD-10-CM | POA: Diagnosis not present

## 2019-03-18 LAB — COMPREHENSIVE METABOLIC PANEL
ALT: 19 U/L (ref 0–44)
ALT: 19 U/L (ref 0–44)
AST: 53 U/L — ABNORMAL HIGH (ref 15–41)
AST: 22 U/L (ref 15–41)
Albumin: 3.9 g/dL (ref 3.5–5.0)
Albumin: 3.8 g/dL (ref 3.5–5.0)
Alkaline Phosphatase: 40 U/L (ref 38–126)
Alkaline Phosphatase: 45 U/L (ref 38–126)
Anion gap: 11 (ref 5–15)
Anion gap: 11 (ref 5–15)
BUN: 9 mg/dL (ref 8–23)
BUN: 9 mg/dL (ref 8–23)
CO2: 23 mmol/L (ref 22–32)
CO2: 23 mmol/L (ref 22–32)
Calcium: 8.6 mg/dL — ABNORMAL LOW (ref 8.9–10.3)
Calcium: 9 mg/dL (ref 8.9–10.3)
Chloride: 96 mmol/L — ABNORMAL LOW (ref 98–111)
Chloride: 99 mmol/L (ref 98–111)
Creatinine, Ser: 1.02 mg/dL — ABNORMAL HIGH (ref 0.44–1.00)
Creatinine, Ser: 1.01 mg/dL — ABNORMAL HIGH (ref 0.44–1.00)
GFR calc Af Amer: >60 mL/min (ref >60)
GFR calc Af Amer: >60 mL/min (ref >60)
GFR calc non Af Amer: 53 mL/min — ABNORMAL LOW (ref >60)
GFR calc non Af Amer: 54 mL/min — ABNORMAL LOW (ref >60)
Glucose, Bld: 125 mg/dL — ABNORMAL HIGH (ref 70–99)
Glucose, Bld: 121 mg/dL — ABNORMAL HIGH (ref 70–99)
Potassium: 5.3 mmol/L — ABNORMAL HIGH (ref 3.5–5.1)
Potassium: 3.9 mmol/L (ref 3.5–5.1)
Sodium: 130 mmol/L — ABNORMAL LOW (ref 135–145)
Sodium: 133 mmol/L — ABNORMAL LOW (ref 135–145)
Total Bilirubin: 2.1 mg/dL — ABNORMAL HIGH (ref 0.3–1.2)
Total Bilirubin: 1.3 mg/dL — ABNORMAL HIGH (ref 0.3–1.2)
Total Protein: 6.4 g/dL — ABNORMAL LOW (ref 6.5–8.1)
Total Protein: 6.7 g/dL (ref 6.5–8.1)

## 2019-03-18 LAB — URINALYSIS, ROUTINE W REFLEX MICROSCOPIC
Bilirubin Urine: NEGATIVE
Glucose, UA: NEGATIVE mg/dL
Ketones, ur: NEGATIVE mg/dL
Leukocytes,Ua: NEGATIVE
Nitrite: NEGATIVE
Protein, ur: NEGATIVE mg/dL
Specific Gravity, Urine: 1.006 (ref 1.005–1.030)
pH: 7 (ref 5.0–8.0)

## 2019-03-18 LAB — CBC WITH DIFFERENTIAL/PLATELET
Abs Immature Granulocytes: 0.03 10*3/uL (ref 0.00–0.07)
Basophils Absolute: 0 10*3/uL (ref 0.0–0.1)
Basophils Relative: 0 %
Eosinophils Absolute: 0 10*3/uL (ref 0.0–0.5)
Eosinophils Relative: 0 %
HCT: 45.3 % (ref 36.0–46.0)
Hemoglobin: 15.4 g/dL — ABNORMAL HIGH (ref 12.0–15.0)
Immature Granulocytes: 0 %
Lymphocytes Relative: 11 %
Lymphs Abs: 1.2 10*3/uL (ref 0.7–4.0)
MCH: 30.9 pg (ref 26.0–34.0)
MCHC: 34 g/dL (ref 30.0–36.0)
MCV: 91 fL (ref 80.0–100.0)
Monocytes Absolute: 0.3 10*3/uL (ref 0.1–1.0)
Monocytes Relative: 3 %
Neutro Abs: 9.4 10*3/uL — ABNORMAL HIGH (ref 1.7–7.7)
Neutrophils Relative %: 86 %
Platelets: 213 10*3/uL (ref 150–400)
RBC: 4.98 MIL/uL (ref 3.87–5.11)
RDW: 13.2 % (ref 11.5–15.5)
WBC: 11 10*3/uL — ABNORMAL HIGH (ref 4.0–10.5)
nRBC: 0 % (ref 0.0–0.2)

## 2019-03-18 LAB — ETHANOL: Alcohol, Ethyl (B): <10 mg/dL (ref <10)

## 2019-03-18 LAB — CBG MONITORING, ED: Glucose-Capillary: 131 mg/dL — ABNORMAL HIGH (ref 70–99)

## 2019-03-18 MED ORDER — ONDANSETRON HCL 4 MG/2ML IJ SOLN: 4.0000 mg | Freq: Once | INTRAMUSCULAR | Status: DC

## 2019-03-18 NOTE — ED Notes (Signed)
Walked pt to bathroom to try and urinate. Pt couldn't urinate.

## 2019-03-18 NOTE — ED Provider Notes (Signed)
Emergency Department Provider Note   I have reviewed the triage vital signs and the nursing notes.   HISTORY  Chief Complaint Fall   HPI Kathleen Dunn is a 78 y.o. female with multiple medical problems documented below who presents to the emergency department today secondary to an episode of weakness and confusion.  Patient states that she was doing well around 6 but she felt very weak and she fell to the floor.  She states she hurt her shoulder and was very weak in took her a long time to get up.  When she got up she took a half a tramadol and became very nauseous and called EMS and brought here for further evaluation.  Was given some Zofran in route still feels some nausea but does state it somewhat improved.  At this time she feels like her mental status is more appropriate is complaining mostly of nausea and right upper arm pain.  No leg pain.  No recent urinary tract symptoms, respiratory symptoms or other associated symptoms.   No other associated or modifying symptoms.    Past Medical History:  Diagnosis Date  . Asthma   . Hypertension   . Lung nodule < 6cm on CT   . Stroke ([place])   . Thyroid disease     Patient Active Problem List   Diagnosis Date Noted  . Unsteady gait 08/18/2018  . Tremor 08/18/2018  . Nausea 08/18/2018  . History of fall 08/18/2018  . Dizziness 08/18/2018  . Disorder of vision 08/18/2018  . Disorder of lung 08/18/2018  . Diarrhea 08/18/2018  . Chronic constipation 08/18/2018  . Common cold 08/18/2018  . Blister of lip without infection 08/18/2018  . Acute bronchitis 08/18/2018  . Breast lump on right side at 1 o'clock position 03/23/2018  . Breast pain 03/23/2018  . Primary insomnia 05/06/2017  . Essential tremor 05/06/2017  . History of cerebellar stroke 05/06/2017  . Basilar artery stenosis/occlusion with infarction ([place]) 03/26/2017  . Cerebrovascular accident (CVA) due to thrombosis of precerebral artery ([place])   . Mixed  hyperlipidemia   . Basilar artery stenosis   . Bilateral carotid artery stenosis   . Cerebellar stroke ([place]) 03/15/2017  . 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 Hypokalemia 04/05/2016  . Near syncope 04/04/2016  . GAD (generalized anxiety disorder) 11/06/2015  . Coronary artery calcification 02/17/2015  . IFG (impaired fasting glucose) 01/12/2015  . Lung nodule < 6cm on CT 05/20/2014  . Obesity (BMI 30.0-34.9) 08/26/2013  . Chronic fatigue 02/18/2013  . DEPRESSION 01/08/2008  . FATIGUE 01/08/2008  . Hypothyroidism 12/04/2007  . HYPONATREMIA 12/04/2007  . ABNORMAL MAMMOGRAM 08/25/2007  . HYPERCHOLESTEROLEMIA 06/24/2006  . PERNICIOUS ANEMIA 06/24/2006  . HYPERTENSION, BENIGN SYSTEMIC 06/24/2006  . LOW BACK PAIN 06/24/2006  . TREMOR 06/24/2006    Past Surgical History:  Procedure Laterality Date  . ABDOMINAL HYSTERECTOMY  age 10   oophorectomy  . BREAST SURGERY     benign bx of left breast  . IR ANGIO INTRA EXTRACRAN SEL COM CAROTID INNOMINATE BILAT MOD SED  03/20/2017  . IR ANGIO INTRA EXTRACRAN SEL COM CAROTID INNOMINATE BILAT MOD SED  12/25/2017  . IR ANGIO VERTEBRAL SEL SUBCLAVIAN INNOMINATE UNI R MOD SED  03/20/2017  . IR ANGIO VERTEBRAL SEL SUBCLAVIAN INNOMINATE UNI R MOD SED  12/25/2017  . IR ANGIO VERTEBRAL SEL VERTEBRAL UNI L MOD SED  03/20/2017  . IR ANGIO VERTEBRAL SEL VERTEBRAL UNI L MOD SED  12/25/2017  . IR PTA INTRACRANIAL  03/26/2017  .  IR RADIOLOGIST EVAL & MGMT  04/14/2017  . IR RADIOLOGIST EVAL & MGMT  09/24/2017  . RADIOLOGY WITH ANESTHESIA N/A 03/26/2017   Procedure: RADIOLOGY WITH ANESTHESIA STENTING;  Surgeon: [name], MD;  Location: [facility];  Service: Radiology;  Laterality: N/A;  . VENTRICULOPERITONEAL SHUNT      Current Outpatient Rx  . Order #: [id number] Class: Historical Med  . Order #: [id number] Class: No Print  . Order #: [id number] Class: Historical Med  . Order #: [id number] Class: Historical Med  . Order #: [id number] Class: Print  . Order #:  [id number] Class: Historical Med  . Order #: [id number] Class: Normal  . Order #: [id number] Class: Historical Med  . Order #: [id number] Class: Historical Med  . Order #: [id number] Class: Historical Med  . Order #: [id number] Class: Normal  . Order #: [id number] Class: Normal  . Order #: [id number] Class: Normal  . Order #: [id number] Class: Historical Med  . Order #: [id number] Class: Historical Med  . Order #: [id number] Class: Historical Med  . Order #: [id number] Class: Normal  . Order #: [id number] Class: Historical Med  . Order #: [id number] Class: Print  . Order #: [id number] Class: Historical Med  . Order #: [id number] Class: Normal    Allergies Codeine, Hydrocodone-acetaminophen, Morphine, Morphine and related, Tetracycline, Lipitor [atorvastatin], Penicillins, Maxitrol [neomycin-polymyxin-dexameth], Albuterol, Erythromycin, Hydroxyzine pamoate, Iodine, Simvastatin, and Sulfonamide derivatives  Family History  Problem Relation Age of Onset  . Heart attack Father     Social History Social History   Tobacco Use  . Smoking status: Former Smoker    Quit date: 09/17/1971    Years since quitting: 47.5  . Smokeless tobacco: Never Used  Substance Use Topics  . Alcohol use: No  . Drug use: No    Review of Systems  All other systems negative except as documented in the HPI. All pertinent positives and negatives as reviewed in the HPI. ____________________________________________   PHYSICAL EXAM:  VITAL SIGNS: ED Triage Vitals  Enc Vitals Group     BP 03/18/19 0030 133/90     Pulse Rate 03/18/19 0030 83     Resp 03/18/19 0030 17     Temp 03/18/19 0027 98.1 F (36.7 C)     Temp Source 03/18/19 0027 Oral     SpO2 03/18/19 0030 99 %    Constitutional: Alert and oriented. Well appearing and in no acute distress. Eyes: Conjunctivae are normal. PERRL. EOMI. Head: Atraumatic. Nose: No congestion/rhinnorhea. Mouth/Throat: Mucous membranes are moist.  Oropharynx non-erythematous. Neck: No  stridor.  No meningeal signs.   Cardiovascular: Normal rate, regular rhythm. Good peripheral circulation. Grossly normal heart sounds.   Respiratory: Normal respiratory effort.  No retractions. Lungs CTAB. Gastrointestinal: Soft and nontender. No distention.  Musculoskeletal: No lower extremity tenderness nor edema. No gross deformities of extremities. Neurologic:  Normal speech and language.  Symmetric eyebrow raise, smile and tongue protrusion.  Pupils equal round reactive to light bilaterally with normal extraocular movements.  Intermittent horizontal nystagmus that extinguishes quickly.  Symmetric and normal grip strength, bicep strength, dorsiflexion and plantarflexion.  Symmetric sensation to light touch on both feet and hands. Skin:  Skin is warm, dry and intact. No rash noted.   ____________________________________________   LABS (all labs ordered are listed, but only abnormal results are displayed)  Labs Reviewed  CBC WITH DIFFERENTIAL/PLATELET - Abnormal; Notable for the following components:      Result Value   WBC 11.0 (*)    Hemoglobin 15.4 (*)    Neutro Abs 9.4 (*)    All other components within normal limits  COMPREHENSIVE METABOLIC PANEL - Abnormal; Notable for the following components:   Sodium 130 (*)    Potassium 5.3 (*)    Chloride 96 (*)    Glucose, Bld 125 (*)    Creatinine, Ser 1.02 (*)    Calcium 8.6 (*)    Total Protein 6.4 (*)    AST 53 (*)    Total Bilirubin 2.1 (*)    GFR calc non Af Amer 53 (*)    All other components within normal limits  URINALYSIS, ROUTINE W REFLEX MICROSCOPIC - Abnormal; Notable for the following components:   Hgb urine dipstick MODERATE (*)    Bacteria, UA RARE (*)    All other components within normal limits  COMPREHENSIVE METABOLIC PANEL - Abnormal; Notable for the following components:   Sodium 133 (*)    Glucose, Bld 121 (*)    Creatinine, Ser 1.01 (*)    Total Bilirubin 1.3 (*)    GFR calc non Af Amer 54 (*)    All  other components within normal limits  CBG MONITORING, ED - Abnormal; Notable for the following components:   Glucose-Capillary 131 (*)    All other components within normal limits  ETHANOL   ____________________________________________  EKG   EKG Interpretation  Date/Time:    Ventricular Rate:    PR Interval:    QRS Duration:   QT Interval:    QTC Calculation:   R Axis:     Text Interpretation:         ____________________________________________  RADIOLOGY  Ct Head Wo Contrast  Result Date: 03/18/2019 CLINICAL DATA:  Head trauma headache EXAM: CT HEAD WITHOUT CONTRAST TECHNIQUE: Contiguous axial images were obtained from the base of the skull through the vertex without intravenous contrast. COMPARISON:  CT 02/21/2019, 12/03/2017 FINDINGS: Brain: No acute territorial infarction, hemorrhage, or intracranial mass. Chronic cerebellar infarcts. Small focus of encephalomalacia left occipital lobe. Atrophy and small vessel ischemic changes of the white matter. Stable ventricle size. Vascular: No hyperdense vessels.  Carotid vascular calcification. Skull: Normal. Negative for fracture or focal lesion. Sinuses/Orbits: Mild mucosal thickening in the sinuses. Other: None. IMPRESSION: 1. No CT evidence for acute intracranial abnormality. 2. Atrophy and small vessel ischemic changes of the white matter. Chronic cerebellar and left occipital infarcts Electronically Signed   By: Donavan Foil M.D.   On: 03/18/2019 00:54   Dg Humerus Right  Result Date: 03/18/2019 CLINICAL DATA:  Fall.  Right arm pain EXAM: RIGHT HUMERUS - 2+ VIEW COMPARISON:  None. FINDINGS: There is no evidence of fracture or other focal bone lesions. Soft tissues are unremarkable. IMPRESSION: Negative. Electronically Signed   By: Rolm Baptise M.D.   On: 03/18/2019 01:13    ____________________________________________   PROCEDURES  Procedure(s) performed:   Procedures   ____________________________________________    INITIAL IMPRESSION / ASSESSMENT AND PLAN / ED COURSE  Generalized weakness and fall.  Will evaluate with head CT, EKG and labs.  Symptomatic treatment.  Patient and daughter don't think she is safe to go home 2/2 generalized weakness. Will consult CM/SW in AM for placement/safe discharge plan.    Pertinent labs & imaging results that were available during my care of the patient were reviewed by me and considered in my medical decision making (see chart for details).  A medical screening exam was performed and I feel the patient has had an appropriate workup for their chief complaint at this time and likelihood of emergent condition existing is low. They have been counseled on decision,  discharge, follow up and which symptoms necessitate immediate return to the emergency department. They or their family verbally stated understanding and agreement with plan and discharged in stable condition.   ____________________________________________  FINAL CLINICAL IMPRESSION(S) / ED DIAGNOSES  Final diagnoses:  None     MEDICATIONS GIVEN DURING THIS VISIT:  Medications  ondansetron (ZOFRAN) injection 4 mg (4 mg Intravenous Not Given 03/18/19 0221)     NEW OUTPATIENT MEDICATIONS STARTED DURING THIS VISIT:  New Prescriptions   No medications on file    Note:  This note was prepared with assistance of Dragon voice recognition software. Occasional wrong-word or sound-a-like substitutions may have occurred due to the inherent limitations of voice recognition software.   Ermal Brzozowski, Corene Cornea, MD 03/18/19 [phone]

## 2019-03-18 NOTE — ED Notes (Signed)
Pt's dtr on her way to pick up pt.

## 2019-03-18 NOTE — ED Notes (Signed)
Daughter, lavita, called saying pt cant leave, lives in a hotel by herself, and cant walk, haws concerns for her if she leaves. [phone]

## 2019-03-18 NOTE — Progress Notes (Signed)
CSW received consult for patient for assisting with a safe discharge plan. CSW met with patient at bedside to complete assessment. Patient reports that she has been living at [facility] by [place] for a while now. Patient reports feeling safe at the hotel that she lives, stating she has frequent visitors. Patient reports having two daughters, [name] and [place]. [name] resides in [place] and Grant lives in [place]. Patient reports that she had a stroke in 2018 that she stayed at [facility] for 3 weeks and one day after her stroke. Patient reports feeling confident in order to care for herself but requested CSW contact [name] to discuss all options.  CSW spoke with [name] at [phone] to discuss a discharge plan. [name] reports that she is willing to come and get her mother and spend the day with her making sure she has groceries, medications, and her other necessities. [name] reports that the patient cannot come to her home in [place] due to there being no space available for an extra person. [name] and CSW agreed to more frequent check ins, and additional visits from the general manager of the hotel. [name] reports that the hotel GM is a family friend who is familiar with the patient who is supportive and makes attempts to ensure the patient is safe. [name] reports that she will arrive at [facility] ED around 10:30am to pick up her mother.  CSW and patient discussed sending her home with DME to assist with her mobility and patient is agreeable.  CSW spoke with Armstead Peaks, [place] and [facility], RN regarding plan. PA to place DME order for patient. Rosendo Gros, RN CM aware and will assist with obtaining DME before discharge.  Madilyn Fireman, MSW, LCSW-A Clinical Social Worker [facility] [phone]

## 2019-03-18 NOTE — ED Notes (Signed)
Patient transported to CT 

## 2019-03-18 NOTE — TOC Progression Note (Signed)
Transition of Care [facility]) - Progression Note    Patient Details  Name: Kathleen Dunn MRN: [id number] Date of Birth: 1941/06/30  Transition [facility] Care [facility] CM/SW Contact  Fuller Mandril, RN Phone Number: 03/18/2019, 9:00 AM  Clinical Narrative:    [facility] spoke with EDP to place DME order for delivery to pt room prior to discharge home today.  EDCM contacted Zack with [facility] to deliver DME rolling walker to pt room prior to discharge home.  Expected Discharge Plan: Home/Self Care Barriers to Discharge: Equipment Delay  Expected Discharge Plan and Services Expected Discharge Plan: Home/Self Care In-house Referral: Clinical Social Work Discharge Planning Services: CM Consult Post Acute Care Choice: Durable Medical Equipment Living arrangements for the past 2 months: Apartment Expected Discharge Date: 03/18/19               DME Arranged: Gilford Rile rolling   Date DME Agency Contacted: 03/18/19 Time DME Agency Contacted: [phone] Representative spoke with at DME Agency: Zack             Social Determinants of Health ([place]) Interventions    Readmission Risk Interventions No flowsheet data found.

## 2019-03-18 NOTE — ED Triage Notes (Signed)
Pt arrives via [facility] from home. Confusion and nausea after fall at home. A/O x4 en route. Abrasion to the right arm after fall. 20 L AC, and Zofran 8mg  given en route.

## 2019-03-18 NOTE — ED Notes (Signed)
Pt sitting up eating breakfast.

## 2019-03-18 NOTE — TOC Initial Note (Signed)
Transition of Care [facility]) - Initial/Assessment Note    Patient Details  Name: Kathleen Dunn MRN: [id number] Date of Birth: [birth year]  Transition of Care [facility]) CM/SW Contact:    Fuller Mandril, RN Phone Number: 03/18/2019, 8:59 AM  Clinical Narrative:                 [facility] consulted regarding DME set-up.  Expected Discharge Plan: Home/Self Care Barriers to Discharge: Equipment Delay   Patient Goals and CMS Choice Patient states their goals for this hospitalization and ongoing recovery are:: be safe at home   Choice offered to / list presented to : Patient  Expected Discharge Plan and Services Expected Discharge Plan: Home/Self Care In-house Referral: Clinical Social Work Discharge Planning Services: CM Consult Post Acute Care Choice: Durable Medical Equipment Living arrangements for the past 2 months: Apartment Expected Discharge Date: 03/18/19               DME Arranged: Gilford Rile rolling   Date DME Agency Contacted: 03/18/19 Time DME Agency Contacted: [phone] Representative spoke with at DME Agency: Zack            Prior Living Arrangements/Services Living arrangements for the past 2 months: Apartment Lives with:: Self Patient language and need for interpreter reviewed:: Yes Do you feel safe going back to the place where you live?: Yes      Need for Family Participation in Patient Care: Yes (Comment) Care giver support system in place?: Yes (comment)   Criminal Activity/Legal Involvement Pertinent to Current Situation/Hospitalization: No - Comment as needed  Activities of Daily Living      Permission Sought/Granted Permission sought to share information with : Case Manager, Customer service manager Permission granted to share information with : Yes, Verbal Permission Granted  Share Information with NAME: Kathleen Dunn     Permission granted to share info w Relationship: daughters     Emotional Assessment Appearance:: Appears stated  age Attitude/Demeanor/Rapport: Engaged Affect (typically observed): Appropriate Orientation: : Oriented to Self, Oriented to Place, Oriented to  Time, Oriented to Situation Alcohol / Substance Use: Not Applicable Psych Involvement: No (comment)  Admission diagnosis:  nausea and confusion  Patient Active Problem List   Diagnosis Date Noted  . Unsteady gait 08/18/2018  . Tremor 08/18/2018  . Nausea 08/18/2018  . History of fall 08/18/2018  . Dizziness 08/18/2018  . Disorder of vision 08/18/2018  . Disorder of lung 08/18/2018  . Diarrhea 08/18/2018  . Chronic constipation 08/18/2018  . Common cold 08/18/2018  . Blister of lip without infection 08/18/2018  . Acute bronchitis 08/18/2018  . Breast lump on right side at 1 o'clock position 03/23/2018  . Breast pain 03/23/2018  . Primary insomnia 05/06/2017  . Essential tremor 05/06/2017  . History of cerebellar stroke 05/06/2017  . Basilar artery stenosis/occlusion with infarction ([place]) 03/26/2017  . Cerebrovascular accident (CVA) due to thrombosis of precerebral artery ([place])   . Mixed hyperlipidemia   . Basilar artery stenosis   . Bilateral carotid artery stenosis   . Cerebellar stroke ([place]) 03/15/2017  . 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 Hypokalemia 04/05/2016  . Near syncope 04/04/2016  . GAD (generalized anxiety disorder) 11/06/2015  . Coronary artery calcification 02/17/2015  . IFG (impaired fasting glucose) 01/12/2015  . Lung nodule < 6cm on CT 05/20/2014  . Obesity (BMI 30.0-34.9) 08/26/2013  . Chronic fatigue 02/18/2013  . DEPRESSION 01/08/2008  . FATIGUE 01/08/2008  . Hypothyroidism 12/04/2007  . HYPONATREMIA 12/04/2007  . ABNORMAL MAMMOGRAM 08/25/2007  . HYPERCHOLESTEROLEMIA 06/24/2006  .  PERNICIOUS ANEMIA 06/24/2006  . HYPERTENSION, BENIGN SYSTEMIC 06/24/2006  . LOW BACK PAIN 06/24/2006  . TREMOR 06/24/2006   PCP:  Hali Marry, MD Pharmacy:   [place], [place] - [address] [place] [address] Phone: [phone] Fax: [phone]     Social Determinants of Health (SDOH) Interventions    Readmission Risk Interventions No flowsheet data found.

## 2019-03-18 NOTE — ED Notes (Addendum)
Pt called 911. Pt said she called 911 because shes "burning all over."Tech went to check on pt and she said they she is "buring all over and it wont stop." Tech checked pt temperature and was 98.3. Pt was given cold rags to cool off. Tech helped pt get dressed. Pt is waiting on daughter to pick her up at this time.

## 2019-03-18 NOTE — ED Provider Notes (Addendum)
I was approached to discharge and provide prescription for walker for patient who was voiding overnight.  CSW spoke with family who decided that patient could go home with her and would do better about checking in on her.  They do not want to seek placement for SNF.  Patient is able to do her own ADLs, but is unsteady on her feet.  Prescription for walker ordered.  Patient will be discharged to family.   Frederica Kuster, PA-C 03/18/19 [id number]    Lacretia Leigh, MD 03/21/19 [phone]

## 2019-03-22 ENCOUNTER — Telehealth (HOSPITAL_COMMUNITY): Payer: Self-pay

## 2019-03-22 NOTE — Telephone Encounter (Signed)
Called daughter to schedule mri, no answer, left vm. AW

## 2019-03-29 ENCOUNTER — Telehealth (HOSPITAL_COMMUNITY): Payer: Self-pay

## 2019-03-29 NOTE — Telephone Encounter (Signed)
Returned pt's call, no answer, left vm. AW  

## 2019-04-05 ENCOUNTER — Other Ambulatory Visit (HOSPITAL_COMMUNITY): Payer: Self-pay | Admitting: Interventional Radiology

## 2019-04-05 DIAGNOSIS — I771 Stricture of artery: Secondary | ICD-10-CM

## 2019-04-05 DIAGNOSIS — R200 Anesthesia of skin: Secondary | ICD-10-CM

## 2019-04-05 DIAGNOSIS — R410 Disorientation, unspecified: Secondary | ICD-10-CM

## 2019-04-07 ENCOUNTER — Telehealth (HOSPITAL_COMMUNITY): Payer: Self-pay

## 2019-04-07 NOTE — Telephone Encounter (Signed)
Pt agreed to f/u with PCP and f/u with Dr. Estanislado Pandy in December for mri/mra. AW

## 2019-04-07 NOTE — Telephone Encounter (Signed)
Called to inform pt that we will schedule her f/u in 6 months. She should f/u with her neurologist or PCP for her other sx. Left message to call back with any questions. AW

## 2019-04-09 DIAGNOSIS — F411 Generalized anxiety disorder: Secondary | ICD-10-CM | POA: Diagnosis not present

## 2019-04-09 DIAGNOSIS — J209 Acute bronchitis, unspecified: Secondary | ICD-10-CM | POA: Diagnosis not present

## 2019-04-09 DIAGNOSIS — Z7902 Long term (current) use of antithrombotics/antiplatelets: Secondary | ICD-10-CM | POA: Diagnosis not present

## 2019-04-09 DIAGNOSIS — I10 Essential (primary) hypertension: Secondary | ICD-10-CM | POA: Diagnosis not present

## 2019-04-15 ENCOUNTER — Other Ambulatory Visit: Payer: Self-pay

## 2019-05-13 DIAGNOSIS — F411 Generalized anxiety disorder: Secondary | ICD-10-CM | POA: Diagnosis not present

## 2019-05-13 DIAGNOSIS — M6281 Muscle weakness (generalized): Secondary | ICD-10-CM | POA: Diagnosis not present

## 2019-05-13 DIAGNOSIS — H65116 Acute and subacute allergic otitis media (mucoid) (sanguinous) (serous), recurrent, bilateral: Secondary | ICD-10-CM | POA: Diagnosis not present

## 2019-05-13 DIAGNOSIS — Z7902 Long term (current) use of antithrombotics/antiplatelets: Secondary | ICD-10-CM | POA: Diagnosis not present

## 2019-05-13 DIAGNOSIS — I10 Essential (primary) hypertension: Secondary | ICD-10-CM | POA: Diagnosis not present

## 2019-05-20 DIAGNOSIS — M25562 Pain in left knee: Secondary | ICD-10-CM | POA: Diagnosis not present

## 2019-05-20 DIAGNOSIS — M25561 Pain in right knee: Secondary | ICD-10-CM | POA: Diagnosis not present

## 2019-05-20 DIAGNOSIS — M170 Bilateral primary osteoarthritis of knee: Secondary | ICD-10-CM | POA: Diagnosis not present

## 2019-07-08 DIAGNOSIS — D550 Anemia due to glucose-6-phosphate dehydrogenase [G6PD] deficiency: Secondary | ICD-10-CM | POA: Diagnosis not present

## 2019-07-12 DIAGNOSIS — M25561 Pain in right knee: Secondary | ICD-10-CM | POA: Diagnosis not present

## 2019-07-12 DIAGNOSIS — M25562 Pain in left knee: Secondary | ICD-10-CM | POA: Diagnosis not present

## 2019-07-12 DIAGNOSIS — M170 Bilateral primary osteoarthritis of knee: Secondary | ICD-10-CM | POA: Diagnosis not present

## 2019-07-15 DIAGNOSIS — M25562 Pain in left knee: Secondary | ICD-10-CM | POA: Diagnosis not present

## 2019-07-15 DIAGNOSIS — M25561 Pain in right knee: Secondary | ICD-10-CM | POA: Diagnosis not present

## 2019-07-15 DIAGNOSIS — M170 Bilateral primary osteoarthritis of knee: Secondary | ICD-10-CM | POA: Diagnosis not present

## 2019-07-19 DIAGNOSIS — M25561 Pain in right knee: Secondary | ICD-10-CM | POA: Diagnosis not present

## 2019-07-19 DIAGNOSIS — M25562 Pain in left knee: Secondary | ICD-10-CM | POA: Diagnosis not present

## 2019-07-19 DIAGNOSIS — M170 Bilateral primary osteoarthritis of knee: Secondary | ICD-10-CM | POA: Diagnosis not present

## 2019-08-18 ENCOUNTER — Telehealth (HOSPITAL_COMMUNITY): Payer: Self-pay

## 2019-08-18 NOTE — Telephone Encounter (Signed)
Called to schedule f/u mri, no answer, left vm. AW 

## 2019-08-23 ENCOUNTER — Telehealth (HOSPITAL_COMMUNITY): Payer: Self-pay

## 2019-08-23 NOTE — Telephone Encounter (Signed)
Returned pt's call, no answer, left vm. AW  

## 2019-11-03 ENCOUNTER — Telehealth: Payer: Self-pay

## 2019-11-03 NOTE — Telephone Encounter (Signed)
Kathleen Dunn called and left a message yesterday requesting a return call. I called and left her a message for patient to call back.

## 2019-11-18 DIAGNOSIS — R531 Weakness: Secondary | ICD-10-CM | POA: Diagnosis not present

## 2019-11-24 DIAGNOSIS — R531 Weakness: Secondary | ICD-10-CM | POA: Diagnosis not present

## 2019-11-30 DIAGNOSIS — R531 Weakness: Secondary | ICD-10-CM | POA: Diagnosis not present

## 2019-12-02 DIAGNOSIS — R531 Weakness: Secondary | ICD-10-CM | POA: Diagnosis not present

## 2019-12-22 ENCOUNTER — Telehealth (HOSPITAL_COMMUNITY): Payer: Self-pay

## 2019-12-22 NOTE — Telephone Encounter (Signed)
Called to schedule mri/mra, no answer, left vm. AW  

## 2019-12-23 ENCOUNTER — Telehealth (HOSPITAL_COMMUNITY): Payer: Self-pay

## 2019-12-23 NOTE — Telephone Encounter (Signed)
Pt called to see about scheduling her f/u. She is currently in [place]. I informed her that she was due for a f/u mri/mra. She will see if this is something her physician in [place] can order. I will call her back on Monday to f/u. AW

## 2019-12-30 ENCOUNTER — Telehealth (HOSPITAL_COMMUNITY): Payer: Self-pay

## 2019-12-30 NOTE — Telephone Encounter (Signed)
-----   Message from Ronney Lion, [place] sent at 12/27/2019 10:39 AM EDT ----- Regarding: RE: CTA vs. MRI [place],  I spoke with Dr. Estanislado Pandy about this. He says ok to proceed with CTA head/neck (with contrast). Please let me know if you have any questions.  Thanks! Ally ----- Message ----- From: Danielle Dess Sent: 12/27/2019   9:53 AM EDT To: Ronney Lion, PA-C Subject: CTA vs. MRI                                    Ally,   I spoke to Ms. Tye Savoy. She is due for a f/u mri/mra. She is extremely claustrophobic and would need anesthesia for the mri because oral sedation never works. She wants to know if she can have a CTA like she's done in the past as f/u instead. Please advise.  Thanks, Lia Foyer

## 2020-01-03 DIAGNOSIS — M5030 Other cervical disc degeneration, unspecified cervical region: Secondary | ICD-10-CM | POA: Diagnosis not present

## 2020-01-03 DIAGNOSIS — R269 Unspecified abnormalities of gait and mobility: Secondary | ICD-10-CM | POA: Diagnosis not present

## 2020-01-03 DIAGNOSIS — R296 Repeated falls: Secondary | ICD-10-CM | POA: Diagnosis not present

## 2020-02-15 DIAGNOSIS — I69398 Other sequelae of cerebral infarction: Secondary | ICD-10-CM | POA: Diagnosis not present

## 2020-02-22 DIAGNOSIS — R05 Cough: Secondary | ICD-10-CM | POA: Diagnosis not present

## 2020-02-24 ENCOUNTER — Other Ambulatory Visit (HOSPITAL_COMMUNITY): Payer: Self-pay | Admitting: Interventional Radiology

## 2020-02-24 ENCOUNTER — Ambulatory Visit
Admission: RE | Admit: 2020-02-24 | Discharge: 2020-02-24 | Disposition: A | Discharge status: Home / Self Care | Payer: Self-pay | Source: Ambulatory Visit | Attending: Interventional Radiology | Admitting: Interventional Radiology

## 2020-02-24 DIAGNOSIS — R52 Pain, unspecified: Secondary | ICD-10-CM

## 2020-02-28 ENCOUNTER — Telehealth (HOSPITAL_COMMUNITY): Payer: Self-pay

## 2020-02-28 NOTE — Telephone Encounter (Signed)
Called pt regarding recent cta, no answer, left vm. AW 

## 2020-03-13 ENCOUNTER — Telehealth (HOSPITAL_COMMUNITY): Payer: Self-pay

## 2020-03-13 NOTE — Telephone Encounter (Signed)
Pt agreed to f/u in 1 year with cta head/neck, she is not currently having any sx. AW

## 2020-03-16 DIAGNOSIS — M545 Low back pain: Secondary | ICD-10-CM | POA: Diagnosis not present

## 2020-03-16 DIAGNOSIS — I10 Essential (primary) hypertension: Secondary | ICD-10-CM | POA: Diagnosis not present

## 2020-03-16 DIAGNOSIS — R29898 Other symptoms and signs involving the musculoskeletal system: Secondary | ICD-10-CM | POA: Diagnosis not present

## 2020-03-16 DIAGNOSIS — G43909 Migraine, unspecified, not intractable, without status migrainosus: Secondary | ICD-10-CM | POA: Diagnosis not present

## 2020-03-16 DIAGNOSIS — Z7982 Long term (current) use of aspirin: Secondary | ICD-10-CM | POA: Diagnosis not present

## 2020-03-16 DIAGNOSIS — Z602 Problems related to living alone: Secondary | ICD-10-CM | POA: Diagnosis not present

## 2020-03-16 DIAGNOSIS — J45909 Unspecified asthma, uncomplicated: Secondary | ICD-10-CM | POA: Diagnosis not present

## 2020-03-16 DIAGNOSIS — I69398 Other sequelae of cerebral infarction: Secondary | ICD-10-CM | POA: Diagnosis not present

## 2020-03-16 DIAGNOSIS — Z9181 History of falling: Secondary | ICD-10-CM | POA: Diagnosis not present

## 2020-03-22 DIAGNOSIS — I10 Essential (primary) hypertension: Secondary | ICD-10-CM | POA: Diagnosis not present

## 2020-03-22 DIAGNOSIS — J45909 Unspecified asthma, uncomplicated: Secondary | ICD-10-CM | POA: Diagnosis not present

## 2020-03-22 DIAGNOSIS — G43909 Migraine, unspecified, not intractable, without status migrainosus: Secondary | ICD-10-CM | POA: Diagnosis not present

## 2020-03-22 DIAGNOSIS — I69398 Other sequelae of cerebral infarction: Secondary | ICD-10-CM | POA: Diagnosis not present

## 2020-03-22 DIAGNOSIS — R29898 Other symptoms and signs involving the musculoskeletal system: Secondary | ICD-10-CM | POA: Diagnosis not present

## 2020-03-22 DIAGNOSIS — M545 Low back pain: Secondary | ICD-10-CM | POA: Diagnosis not present

## 2020-03-24 DIAGNOSIS — J45909 Unspecified asthma, uncomplicated: Secondary | ICD-10-CM | POA: Diagnosis not present

## 2020-03-24 DIAGNOSIS — I69398 Other sequelae of cerebral infarction: Secondary | ICD-10-CM | POA: Diagnosis not present

## 2020-03-24 DIAGNOSIS — G43909 Migraine, unspecified, not intractable, without status migrainosus: Secondary | ICD-10-CM | POA: Diagnosis not present

## 2020-03-24 DIAGNOSIS — R29898 Other symptoms and signs involving the musculoskeletal system: Secondary | ICD-10-CM | POA: Diagnosis not present

## 2020-03-24 DIAGNOSIS — I10 Essential (primary) hypertension: Secondary | ICD-10-CM | POA: Diagnosis not present

## 2020-03-24 DIAGNOSIS — M545 Low back pain: Secondary | ICD-10-CM | POA: Diagnosis not present

## 2020-03-27 DIAGNOSIS — R29898 Other symptoms and signs involving the musculoskeletal system: Secondary | ICD-10-CM | POA: Diagnosis not present

## 2020-03-27 DIAGNOSIS — I69398 Other sequelae of cerebral infarction: Secondary | ICD-10-CM | POA: Diagnosis not present

## 2020-03-27 DIAGNOSIS — G43909 Migraine, unspecified, not intractable, without status migrainosus: Secondary | ICD-10-CM | POA: Diagnosis not present

## 2020-03-27 DIAGNOSIS — J45909 Unspecified asthma, uncomplicated: Secondary | ICD-10-CM | POA: Diagnosis not present

## 2020-03-27 DIAGNOSIS — M545 Low back pain: Secondary | ICD-10-CM | POA: Diagnosis not present

## 2020-03-27 DIAGNOSIS — I10 Essential (primary) hypertension: Secondary | ICD-10-CM | POA: Diagnosis not present

## 2020-03-30 DIAGNOSIS — G43909 Migraine, unspecified, not intractable, without status migrainosus: Secondary | ICD-10-CM | POA: Diagnosis not present

## 2020-03-30 DIAGNOSIS — J45909 Unspecified asthma, uncomplicated: Secondary | ICD-10-CM | POA: Diagnosis not present

## 2020-03-30 DIAGNOSIS — R29898 Other symptoms and signs involving the musculoskeletal system: Secondary | ICD-10-CM | POA: Diagnosis not present

## 2020-03-30 DIAGNOSIS — I10 Essential (primary) hypertension: Secondary | ICD-10-CM | POA: Diagnosis not present

## 2020-03-30 DIAGNOSIS — I69398 Other sequelae of cerebral infarction: Secondary | ICD-10-CM | POA: Diagnosis not present

## 2020-03-30 DIAGNOSIS — M545 Low back pain: Secondary | ICD-10-CM | POA: Diagnosis not present

## 2020-04-04 DIAGNOSIS — R29898 Other symptoms and signs involving the musculoskeletal system: Secondary | ICD-10-CM | POA: Diagnosis not present

## 2020-04-04 DIAGNOSIS — I69398 Other sequelae of cerebral infarction: Secondary | ICD-10-CM | POA: Diagnosis not present

## 2020-04-04 DIAGNOSIS — G43909 Migraine, unspecified, not intractable, without status migrainosus: Secondary | ICD-10-CM | POA: Diagnosis not present

## 2020-04-04 DIAGNOSIS — J45909 Unspecified asthma, uncomplicated: Secondary | ICD-10-CM | POA: Diagnosis not present

## 2020-04-04 DIAGNOSIS — I10 Essential (primary) hypertension: Secondary | ICD-10-CM | POA: Diagnosis not present

## 2020-04-04 DIAGNOSIS — M545 Low back pain: Secondary | ICD-10-CM | POA: Diagnosis not present

## 2020-04-06 DIAGNOSIS — M545 Low back pain: Secondary | ICD-10-CM | POA: Diagnosis not present

## 2020-04-06 DIAGNOSIS — J45909 Unspecified asthma, uncomplicated: Secondary | ICD-10-CM | POA: Diagnosis not present

## 2020-04-06 DIAGNOSIS — I69398 Other sequelae of cerebral infarction: Secondary | ICD-10-CM | POA: Diagnosis not present

## 2020-04-06 DIAGNOSIS — R29898 Other symptoms and signs involving the musculoskeletal system: Secondary | ICD-10-CM | POA: Diagnosis not present

## 2020-04-06 DIAGNOSIS — G43909 Migraine, unspecified, not intractable, without status migrainosus: Secondary | ICD-10-CM | POA: Diagnosis not present

## 2020-04-06 DIAGNOSIS — I10 Essential (primary) hypertension: Secondary | ICD-10-CM | POA: Diagnosis not present

## 2020-04-12 DIAGNOSIS — M545 Low back pain: Secondary | ICD-10-CM | POA: Diagnosis not present

## 2020-04-12 DIAGNOSIS — I69398 Other sequelae of cerebral infarction: Secondary | ICD-10-CM | POA: Diagnosis not present

## 2020-04-12 DIAGNOSIS — G43909 Migraine, unspecified, not intractable, without status migrainosus: Secondary | ICD-10-CM | POA: Diagnosis not present

## 2020-04-12 DIAGNOSIS — I10 Essential (primary) hypertension: Secondary | ICD-10-CM | POA: Diagnosis not present

## 2020-04-12 DIAGNOSIS — J45909 Unspecified asthma, uncomplicated: Secondary | ICD-10-CM | POA: Diagnosis not present

## 2020-04-12 DIAGNOSIS — R29898 Other symptoms and signs involving the musculoskeletal system: Secondary | ICD-10-CM | POA: Diagnosis not present

## 2020-04-13 DIAGNOSIS — I10 Essential (primary) hypertension: Secondary | ICD-10-CM | POA: Diagnosis not present

## 2020-04-13 DIAGNOSIS — I69398 Other sequelae of cerebral infarction: Secondary | ICD-10-CM | POA: Diagnosis not present

## 2020-04-13 DIAGNOSIS — R29898 Other symptoms and signs involving the musculoskeletal system: Secondary | ICD-10-CM | POA: Diagnosis not present

## 2020-04-13 DIAGNOSIS — J45909 Unspecified asthma, uncomplicated: Secondary | ICD-10-CM | POA: Diagnosis not present

## 2020-04-13 DIAGNOSIS — G43909 Migraine, unspecified, not intractable, without status migrainosus: Secondary | ICD-10-CM | POA: Diagnosis not present

## 2020-04-13 DIAGNOSIS — M545 Low back pain: Secondary | ICD-10-CM | POA: Diagnosis not present

## 2020-04-16 DIAGNOSIS — G43909 Migraine, unspecified, not intractable, without status migrainosus: Secondary | ICD-10-CM | POA: Diagnosis not present

## 2020-05-24 DIAGNOSIS — M170 Bilateral primary osteoarthritis of knee: Secondary | ICD-10-CM | POA: Diagnosis not present

## 2020-06-23 DIAGNOSIS — Z8673 Personal history of transient ischemic attack (TIA), and cerebral infarction without residual deficits: Secondary | ICD-10-CM | POA: Diagnosis not present

## 2020-06-23 DIAGNOSIS — Z9181 History of falling: Secondary | ICD-10-CM | POA: Diagnosis not present

## 2020-06-23 DIAGNOSIS — M545 Low back pain, unspecified: Secondary | ICD-10-CM | POA: Diagnosis not present

## 2020-06-23 DIAGNOSIS — G43909 Migraine, unspecified, not intractable, without status migrainosus: Secondary | ICD-10-CM | POA: Diagnosis not present

## 2020-06-23 DIAGNOSIS — I350 Nonrheumatic aortic (valve) stenosis: Secondary | ICD-10-CM | POA: Diagnosis not present

## 2020-06-23 DIAGNOSIS — K219 Gastro-esophageal reflux disease without esophagitis: Secondary | ICD-10-CM | POA: Diagnosis not present

## 2020-06-23 DIAGNOSIS — I10 Essential (primary) hypertension: Secondary | ICD-10-CM | POA: Diagnosis not present

## 2020-06-27 DIAGNOSIS — I350 Nonrheumatic aortic (valve) stenosis: Secondary | ICD-10-CM | POA: Diagnosis not present

## 2020-06-27 DIAGNOSIS — G43909 Migraine, unspecified, not intractable, without status migrainosus: Secondary | ICD-10-CM | POA: Diagnosis not present

## 2020-06-27 DIAGNOSIS — K219 Gastro-esophageal reflux disease without esophagitis: Secondary | ICD-10-CM | POA: Diagnosis not present

## 2020-06-27 DIAGNOSIS — M545 Low back pain, unspecified: Secondary | ICD-10-CM | POA: Diagnosis not present

## 2020-06-27 DIAGNOSIS — I10 Essential (primary) hypertension: Secondary | ICD-10-CM | POA: Diagnosis not present

## 2020-06-27 DIAGNOSIS — Z8673 Personal history of transient ischemic attack (TIA), and cerebral infarction without residual deficits: Secondary | ICD-10-CM | POA: Diagnosis not present

## 2020-06-29 DIAGNOSIS — Z8673 Personal history of transient ischemic attack (TIA), and cerebral infarction without residual deficits: Secondary | ICD-10-CM | POA: Diagnosis not present

## 2020-06-29 DIAGNOSIS — K219 Gastro-esophageal reflux disease without esophagitis: Secondary | ICD-10-CM | POA: Diagnosis not present

## 2020-06-29 DIAGNOSIS — I350 Nonrheumatic aortic (valve) stenosis: Secondary | ICD-10-CM | POA: Diagnosis not present

## 2020-06-29 DIAGNOSIS — I10 Essential (primary) hypertension: Secondary | ICD-10-CM | POA: Diagnosis not present

## 2020-06-29 DIAGNOSIS — M545 Low back pain, unspecified: Secondary | ICD-10-CM | POA: Diagnosis not present

## 2020-06-29 DIAGNOSIS — G43909 Migraine, unspecified, not intractable, without status migrainosus: Secondary | ICD-10-CM | POA: Diagnosis not present

## 2020-07-04 DIAGNOSIS — I350 Nonrheumatic aortic (valve) stenosis: Secondary | ICD-10-CM | POA: Diagnosis not present

## 2020-07-04 DIAGNOSIS — Z8673 Personal history of transient ischemic attack (TIA), and cerebral infarction without residual deficits: Secondary | ICD-10-CM | POA: Diagnosis not present

## 2020-07-04 DIAGNOSIS — G43909 Migraine, unspecified, not intractable, without status migrainosus: Secondary | ICD-10-CM | POA: Diagnosis not present

## 2020-07-04 DIAGNOSIS — M545 Low back pain, unspecified: Secondary | ICD-10-CM | POA: Diagnosis not present

## 2020-07-04 DIAGNOSIS — K219 Gastro-esophageal reflux disease without esophagitis: Secondary | ICD-10-CM | POA: Diagnosis not present

## 2020-07-04 DIAGNOSIS — I10 Essential (primary) hypertension: Secondary | ICD-10-CM | POA: Diagnosis not present

## 2020-07-06 DIAGNOSIS — K219 Gastro-esophageal reflux disease without esophagitis: Secondary | ICD-10-CM | POA: Diagnosis not present

## 2020-07-06 DIAGNOSIS — Z8673 Personal history of transient ischemic attack (TIA), and cerebral infarction without residual deficits: Secondary | ICD-10-CM | POA: Diagnosis not present

## 2020-07-06 DIAGNOSIS — M545 Low back pain, unspecified: Secondary | ICD-10-CM | POA: Diagnosis not present

## 2020-07-06 DIAGNOSIS — I350 Nonrheumatic aortic (valve) stenosis: Secondary | ICD-10-CM | POA: Diagnosis not present

## 2020-07-06 DIAGNOSIS — I10 Essential (primary) hypertension: Secondary | ICD-10-CM | POA: Diagnosis not present

## 2020-07-06 DIAGNOSIS — G43909 Migraine, unspecified, not intractable, without status migrainosus: Secondary | ICD-10-CM | POA: Diagnosis not present

## 2020-07-11 DIAGNOSIS — I350 Nonrheumatic aortic (valve) stenosis: Secondary | ICD-10-CM | POA: Diagnosis not present

## 2020-07-11 DIAGNOSIS — Z8673 Personal history of transient ischemic attack (TIA), and cerebral infarction without residual deficits: Secondary | ICD-10-CM | POA: Diagnosis not present

## 2020-07-11 DIAGNOSIS — I10 Essential (primary) hypertension: Secondary | ICD-10-CM | POA: Diagnosis not present

## 2020-07-11 DIAGNOSIS — G43909 Migraine, unspecified, not intractable, without status migrainosus: Secondary | ICD-10-CM | POA: Diagnosis not present

## 2020-07-11 DIAGNOSIS — K219 Gastro-esophageal reflux disease without esophagitis: Secondary | ICD-10-CM | POA: Diagnosis not present

## 2020-07-11 DIAGNOSIS — M545 Low back pain, unspecified: Secondary | ICD-10-CM | POA: Diagnosis not present

## 2020-07-13 DIAGNOSIS — M545 Low back pain, unspecified: Secondary | ICD-10-CM | POA: Diagnosis not present

## 2020-07-13 DIAGNOSIS — I350 Nonrheumatic aortic (valve) stenosis: Secondary | ICD-10-CM | POA: Diagnosis not present

## 2020-07-13 DIAGNOSIS — Z8673 Personal history of transient ischemic attack (TIA), and cerebral infarction without residual deficits: Secondary | ICD-10-CM | POA: Diagnosis not present

## 2020-07-13 DIAGNOSIS — I10 Essential (primary) hypertension: Secondary | ICD-10-CM | POA: Diagnosis not present

## 2020-07-13 DIAGNOSIS — G43909 Migraine, unspecified, not intractable, without status migrainosus: Secondary | ICD-10-CM | POA: Diagnosis not present

## 2020-07-13 DIAGNOSIS — K219 Gastro-esophageal reflux disease without esophagitis: Secondary | ICD-10-CM | POA: Diagnosis not present

## 2020-07-18 DIAGNOSIS — G43909 Migraine, unspecified, not intractable, without status migrainosus: Secondary | ICD-10-CM | POA: Diagnosis not present

## 2020-07-18 DIAGNOSIS — M545 Low back pain, unspecified: Secondary | ICD-10-CM | POA: Diagnosis not present

## 2020-07-18 DIAGNOSIS — K219 Gastro-esophageal reflux disease without esophagitis: Secondary | ICD-10-CM | POA: Diagnosis not present

## 2020-07-18 DIAGNOSIS — I350 Nonrheumatic aortic (valve) stenosis: Secondary | ICD-10-CM | POA: Diagnosis not present

## 2020-07-18 DIAGNOSIS — Z8673 Personal history of transient ischemic attack (TIA), and cerebral infarction without residual deficits: Secondary | ICD-10-CM | POA: Diagnosis not present

## 2020-07-18 DIAGNOSIS — I10 Essential (primary) hypertension: Secondary | ICD-10-CM | POA: Diagnosis not present

## 2020-07-20 DIAGNOSIS — Z8673 Personal history of transient ischemic attack (TIA), and cerebral infarction without residual deficits: Secondary | ICD-10-CM | POA: Diagnosis not present

## 2020-07-20 DIAGNOSIS — K219 Gastro-esophageal reflux disease without esophagitis: Secondary | ICD-10-CM | POA: Diagnosis not present

## 2020-07-20 DIAGNOSIS — M545 Low back pain, unspecified: Secondary | ICD-10-CM | POA: Diagnosis not present

## 2020-07-20 DIAGNOSIS — I350 Nonrheumatic aortic (valve) stenosis: Secondary | ICD-10-CM | POA: Diagnosis not present

## 2020-07-20 DIAGNOSIS — G43909 Migraine, unspecified, not intractable, without status migrainosus: Secondary | ICD-10-CM | POA: Diagnosis not present

## 2020-07-20 DIAGNOSIS — I10 Essential (primary) hypertension: Secondary | ICD-10-CM | POA: Diagnosis not present

## 2020-07-23 DIAGNOSIS — I350 Nonrheumatic aortic (valve) stenosis: Secondary | ICD-10-CM | POA: Diagnosis not present

## 2020-07-23 DIAGNOSIS — Z8673 Personal history of transient ischemic attack (TIA), and cerebral infarction without residual deficits: Secondary | ICD-10-CM | POA: Diagnosis not present

## 2020-07-23 DIAGNOSIS — M545 Low back pain, unspecified: Secondary | ICD-10-CM | POA: Diagnosis not present

## 2020-07-23 DIAGNOSIS — I10 Essential (primary) hypertension: Secondary | ICD-10-CM | POA: Diagnosis not present

## 2020-07-23 DIAGNOSIS — G43909 Migraine, unspecified, not intractable, without status migrainosus: Secondary | ICD-10-CM | POA: Diagnosis not present

## 2020-07-23 DIAGNOSIS — Z9181 History of falling: Secondary | ICD-10-CM | POA: Diagnosis not present

## 2020-07-23 DIAGNOSIS — K219 Gastro-esophageal reflux disease without esophagitis: Secondary | ICD-10-CM | POA: Diagnosis not present

## 2020-07-25 DIAGNOSIS — Z8673 Personal history of transient ischemic attack (TIA), and cerebral infarction without residual deficits: Secondary | ICD-10-CM | POA: Diagnosis not present

## 2020-07-25 DIAGNOSIS — M545 Low back pain, unspecified: Secondary | ICD-10-CM | POA: Diagnosis not present

## 2020-07-25 DIAGNOSIS — G43909 Migraine, unspecified, not intractable, without status migrainosus: Secondary | ICD-10-CM | POA: Diagnosis not present

## 2020-07-25 DIAGNOSIS — I350 Nonrheumatic aortic (valve) stenosis: Secondary | ICD-10-CM | POA: Diagnosis not present

## 2020-07-25 DIAGNOSIS — K219 Gastro-esophageal reflux disease without esophagitis: Secondary | ICD-10-CM | POA: Diagnosis not present

## 2020-07-25 DIAGNOSIS — I10 Essential (primary) hypertension: Secondary | ICD-10-CM | POA: Diagnosis not present

## 2020-07-26 DIAGNOSIS — Z6830 Body mass index (BMI) 30.0-30.9, adult: Secondary | ICD-10-CM | POA: Diagnosis not present

## 2020-07-26 DIAGNOSIS — M5416 Radiculopathy, lumbar region: Secondary | ICD-10-CM | POA: Diagnosis not present

## 2020-07-26 DIAGNOSIS — I10 Essential (primary) hypertension: Secondary | ICD-10-CM | POA: Diagnosis not present

## 2020-07-26 DIAGNOSIS — M170 Bilateral primary osteoarthritis of knee: Secondary | ICD-10-CM | POA: Diagnosis not present

## 2020-07-27 DIAGNOSIS — I10 Essential (primary) hypertension: Secondary | ICD-10-CM | POA: Diagnosis not present

## 2020-07-27 DIAGNOSIS — G43909 Migraine, unspecified, not intractable, without status migrainosus: Secondary | ICD-10-CM | POA: Diagnosis not present

## 2020-07-27 DIAGNOSIS — Z8673 Personal history of transient ischemic attack (TIA), and cerebral infarction without residual deficits: Secondary | ICD-10-CM | POA: Diagnosis not present

## 2020-07-27 DIAGNOSIS — I350 Nonrheumatic aortic (valve) stenosis: Secondary | ICD-10-CM | POA: Diagnosis not present

## 2020-07-27 DIAGNOSIS — M545 Low back pain, unspecified: Secondary | ICD-10-CM | POA: Diagnosis not present

## 2020-07-27 DIAGNOSIS — K219 Gastro-esophageal reflux disease without esophagitis: Secondary | ICD-10-CM | POA: Diagnosis not present

## 2020-08-01 DIAGNOSIS — M25562 Pain in left knee: Secondary | ICD-10-CM | POA: Diagnosis not present

## 2020-08-01 DIAGNOSIS — M25561 Pain in right knee: Secondary | ICD-10-CM | POA: Diagnosis not present

## 2020-08-02 DIAGNOSIS — Z8673 Personal history of transient ischemic attack (TIA), and cerebral infarction without residual deficits: Secondary | ICD-10-CM | POA: Diagnosis not present

## 2020-08-02 DIAGNOSIS — G43909 Migraine, unspecified, not intractable, without status migrainosus: Secondary | ICD-10-CM | POA: Diagnosis not present

## 2020-08-02 DIAGNOSIS — M545 Low back pain, unspecified: Secondary | ICD-10-CM | POA: Diagnosis not present

## 2020-08-02 DIAGNOSIS — I10 Essential (primary) hypertension: Secondary | ICD-10-CM | POA: Diagnosis not present

## 2020-08-02 DIAGNOSIS — K219 Gastro-esophageal reflux disease without esophagitis: Secondary | ICD-10-CM | POA: Diagnosis not present

## 2020-08-02 DIAGNOSIS — I350 Nonrheumatic aortic (valve) stenosis: Secondary | ICD-10-CM | POA: Diagnosis not present

## 2020-08-08 DIAGNOSIS — M170 Bilateral primary osteoarthritis of knee: Secondary | ICD-10-CM | POA: Diagnosis not present

## 2020-08-08 DIAGNOSIS — M1711 Unilateral primary osteoarthritis, right knee: Secondary | ICD-10-CM | POA: Diagnosis not present

## 2020-08-08 DIAGNOSIS — I350 Nonrheumatic aortic (valve) stenosis: Secondary | ICD-10-CM | POA: Diagnosis not present

## 2020-08-08 DIAGNOSIS — G43909 Migraine, unspecified, not intractable, without status migrainosus: Secondary | ICD-10-CM | POA: Diagnosis not present

## 2020-08-08 DIAGNOSIS — M545 Low back pain, unspecified: Secondary | ICD-10-CM | POA: Diagnosis not present

## 2020-08-08 DIAGNOSIS — K219 Gastro-esophageal reflux disease without esophagitis: Secondary | ICD-10-CM | POA: Diagnosis not present

## 2020-08-08 DIAGNOSIS — Z8673 Personal history of transient ischemic attack (TIA), and cerebral infarction without residual deficits: Secondary | ICD-10-CM | POA: Diagnosis not present

## 2020-08-08 DIAGNOSIS — M5416 Radiculopathy, lumbar region: Secondary | ICD-10-CM | POA: Diagnosis not present

## 2020-08-08 DIAGNOSIS — I10 Essential (primary) hypertension: Secondary | ICD-10-CM | POA: Diagnosis not present

## 2020-08-12 DIAGNOSIS — I350 Nonrheumatic aortic (valve) stenosis: Secondary | ICD-10-CM | POA: Diagnosis not present

## 2020-08-12 DIAGNOSIS — K219 Gastro-esophageal reflux disease without esophagitis: Secondary | ICD-10-CM | POA: Diagnosis not present

## 2020-08-12 DIAGNOSIS — G43909 Migraine, unspecified, not intractable, without status migrainosus: Secondary | ICD-10-CM | POA: Diagnosis not present

## 2020-08-12 DIAGNOSIS — M545 Low back pain, unspecified: Secondary | ICD-10-CM | POA: Diagnosis not present

## 2020-08-12 DIAGNOSIS — Z8673 Personal history of transient ischemic attack (TIA), and cerebral infarction without residual deficits: Secondary | ICD-10-CM | POA: Diagnosis not present

## 2020-08-12 DIAGNOSIS — I10 Essential (primary) hypertension: Secondary | ICD-10-CM | POA: Diagnosis not present

## 2020-08-15 DIAGNOSIS — K219 Gastro-esophageal reflux disease without esophagitis: Secondary | ICD-10-CM | POA: Diagnosis not present

## 2020-08-15 DIAGNOSIS — M545 Low back pain, unspecified: Secondary | ICD-10-CM | POA: Diagnosis not present

## 2020-08-15 DIAGNOSIS — Z8673 Personal history of transient ischemic attack (TIA), and cerebral infarction without residual deficits: Secondary | ICD-10-CM | POA: Diagnosis not present

## 2020-08-15 DIAGNOSIS — I350 Nonrheumatic aortic (valve) stenosis: Secondary | ICD-10-CM | POA: Diagnosis not present

## 2020-08-15 DIAGNOSIS — G43909 Migraine, unspecified, not intractable, without status migrainosus: Secondary | ICD-10-CM | POA: Diagnosis not present

## 2020-08-15 DIAGNOSIS — I10 Essential (primary) hypertension: Secondary | ICD-10-CM | POA: Diagnosis not present

## 2020-08-17 DIAGNOSIS — Z8673 Personal history of transient ischemic attack (TIA), and cerebral infarction without residual deficits: Secondary | ICD-10-CM | POA: Diagnosis not present

## 2020-08-17 DIAGNOSIS — G43909 Migraine, unspecified, not intractable, without status migrainosus: Secondary | ICD-10-CM | POA: Diagnosis not present

## 2020-08-17 DIAGNOSIS — I350 Nonrheumatic aortic (valve) stenosis: Secondary | ICD-10-CM | POA: Diagnosis not present

## 2020-08-17 DIAGNOSIS — M545 Low back pain, unspecified: Secondary | ICD-10-CM | POA: Diagnosis not present

## 2020-08-17 DIAGNOSIS — K219 Gastro-esophageal reflux disease without esophagitis: Secondary | ICD-10-CM | POA: Diagnosis not present

## 2020-08-17 DIAGNOSIS — I10 Essential (primary) hypertension: Secondary | ICD-10-CM | POA: Diagnosis not present

## 2020-08-22 DIAGNOSIS — Z9181 History of falling: Secondary | ICD-10-CM | POA: Diagnosis not present

## 2020-08-22 DIAGNOSIS — I350 Nonrheumatic aortic (valve) stenosis: Secondary | ICD-10-CM | POA: Diagnosis not present

## 2020-08-22 DIAGNOSIS — M545 Low back pain, unspecified: Secondary | ICD-10-CM | POA: Diagnosis not present

## 2020-08-22 DIAGNOSIS — I10 Essential (primary) hypertension: Secondary | ICD-10-CM | POA: Diagnosis not present

## 2020-08-22 DIAGNOSIS — E785 Hyperlipidemia, unspecified: Secondary | ICD-10-CM | POA: Diagnosis not present

## 2020-08-22 DIAGNOSIS — M170 Bilateral primary osteoarthritis of knee: Secondary | ICD-10-CM | POA: Diagnosis not present

## 2020-08-22 DIAGNOSIS — F419 Anxiety disorder, unspecified: Secondary | ICD-10-CM | POA: Diagnosis not present

## 2020-08-22 DIAGNOSIS — E559 Vitamin D deficiency, unspecified: Secondary | ICD-10-CM | POA: Diagnosis not present

## 2020-08-22 DIAGNOSIS — G43909 Migraine, unspecified, not intractable, without status migrainosus: Secondary | ICD-10-CM | POA: Diagnosis not present

## 2020-08-22 DIAGNOSIS — K219 Gastro-esophageal reflux disease without esophagitis: Secondary | ICD-10-CM | POA: Diagnosis not present

## 2020-08-22 DIAGNOSIS — M5410 Radiculopathy, site unspecified: Secondary | ICD-10-CM | POA: Diagnosis not present

## 2020-08-22 DIAGNOSIS — E079 Disorder of thyroid, unspecified: Secondary | ICD-10-CM | POA: Diagnosis not present

## 2020-08-22 DIAGNOSIS — Z87891 Personal history of nicotine dependence: Secondary | ICD-10-CM | POA: Diagnosis not present

## 2020-08-22 DIAGNOSIS — Z8673 Personal history of transient ischemic attack (TIA), and cerebral infarction without residual deficits: Secondary | ICD-10-CM | POA: Diagnosis not present

## 2020-08-22 DIAGNOSIS — G629 Polyneuropathy, unspecified: Secondary | ICD-10-CM | POA: Diagnosis not present

## 2020-08-29 DIAGNOSIS — K219 Gastro-esophageal reflux disease without esophagitis: Secondary | ICD-10-CM | POA: Diagnosis not present

## 2020-08-29 DIAGNOSIS — G43909 Migraine, unspecified, not intractable, without status migrainosus: Secondary | ICD-10-CM | POA: Diagnosis not present

## 2020-08-29 DIAGNOSIS — M545 Low back pain, unspecified: Secondary | ICD-10-CM | POA: Diagnosis not present

## 2020-08-29 DIAGNOSIS — I350 Nonrheumatic aortic (valve) stenosis: Secondary | ICD-10-CM | POA: Diagnosis not present

## 2020-08-29 DIAGNOSIS — I10 Essential (primary) hypertension: Secondary | ICD-10-CM | POA: Diagnosis not present

## 2020-08-29 DIAGNOSIS — M170 Bilateral primary osteoarthritis of knee: Secondary | ICD-10-CM | POA: Diagnosis not present

## 2020-08-31 DIAGNOSIS — G43909 Migraine, unspecified, not intractable, without status migrainosus: Secondary | ICD-10-CM | POA: Diagnosis not present

## 2020-08-31 DIAGNOSIS — M170 Bilateral primary osteoarthritis of knee: Secondary | ICD-10-CM | POA: Diagnosis not present

## 2020-08-31 DIAGNOSIS — M545 Low back pain, unspecified: Secondary | ICD-10-CM | POA: Diagnosis not present

## 2020-08-31 DIAGNOSIS — I350 Nonrheumatic aortic (valve) stenosis: Secondary | ICD-10-CM | POA: Diagnosis not present

## 2020-08-31 DIAGNOSIS — I10 Essential (primary) hypertension: Secondary | ICD-10-CM | POA: Diagnosis not present

## 2020-08-31 DIAGNOSIS — K219 Gastro-esophageal reflux disease without esophagitis: Secondary | ICD-10-CM | POA: Diagnosis not present

## 2020-09-05 DIAGNOSIS — M545 Low back pain, unspecified: Secondary | ICD-10-CM | POA: Diagnosis not present

## 2020-09-05 DIAGNOSIS — K219 Gastro-esophageal reflux disease without esophagitis: Secondary | ICD-10-CM | POA: Diagnosis not present

## 2020-09-05 DIAGNOSIS — G43909 Migraine, unspecified, not intractable, without status migrainosus: Secondary | ICD-10-CM | POA: Diagnosis not present

## 2020-09-05 DIAGNOSIS — I10 Essential (primary) hypertension: Secondary | ICD-10-CM | POA: Diagnosis not present

## 2020-09-05 DIAGNOSIS — I350 Nonrheumatic aortic (valve) stenosis: Secondary | ICD-10-CM | POA: Diagnosis not present

## 2020-09-05 DIAGNOSIS — M170 Bilateral primary osteoarthritis of knee: Secondary | ICD-10-CM | POA: Diagnosis not present

## 2020-09-06 DIAGNOSIS — K219 Gastro-esophageal reflux disease without esophagitis: Secondary | ICD-10-CM | POA: Diagnosis not present

## 2020-09-06 DIAGNOSIS — I10 Essential (primary) hypertension: Secondary | ICD-10-CM | POA: Diagnosis not present

## 2020-09-06 DIAGNOSIS — M545 Low back pain, unspecified: Secondary | ICD-10-CM | POA: Diagnosis not present

## 2020-09-06 DIAGNOSIS — G43909 Migraine, unspecified, not intractable, without status migrainosus: Secondary | ICD-10-CM | POA: Diagnosis not present

## 2020-09-06 DIAGNOSIS — M170 Bilateral primary osteoarthritis of knee: Secondary | ICD-10-CM | POA: Diagnosis not present

## 2020-09-06 DIAGNOSIS — I350 Nonrheumatic aortic (valve) stenosis: Secondary | ICD-10-CM | POA: Diagnosis not present

## 2020-09-13 DIAGNOSIS — I10 Essential (primary) hypertension: Secondary | ICD-10-CM | POA: Diagnosis not present

## 2020-09-13 DIAGNOSIS — M170 Bilateral primary osteoarthritis of knee: Secondary | ICD-10-CM | POA: Diagnosis not present

## 2020-09-13 DIAGNOSIS — K219 Gastro-esophageal reflux disease without esophagitis: Secondary | ICD-10-CM | POA: Diagnosis not present

## 2020-09-13 DIAGNOSIS — M545 Low back pain, unspecified: Secondary | ICD-10-CM | POA: Diagnosis not present

## 2020-09-13 DIAGNOSIS — G43909 Migraine, unspecified, not intractable, without status migrainosus: Secondary | ICD-10-CM | POA: Diagnosis not present

## 2020-09-13 DIAGNOSIS — I350 Nonrheumatic aortic (valve) stenosis: Secondary | ICD-10-CM | POA: Diagnosis not present

## 2020-09-19 DIAGNOSIS — I350 Nonrheumatic aortic (valve) stenosis: Secondary | ICD-10-CM | POA: Diagnosis not present

## 2020-09-19 DIAGNOSIS — M545 Low back pain, unspecified: Secondary | ICD-10-CM | POA: Diagnosis not present

## 2020-09-19 DIAGNOSIS — M170 Bilateral primary osteoarthritis of knee: Secondary | ICD-10-CM | POA: Diagnosis not present

## 2020-09-19 DIAGNOSIS — G43909 Migraine, unspecified, not intractable, without status migrainosus: Secondary | ICD-10-CM | POA: Diagnosis not present

## 2020-09-19 DIAGNOSIS — K219 Gastro-esophageal reflux disease without esophagitis: Secondary | ICD-10-CM | POA: Diagnosis not present

## 2020-09-19 DIAGNOSIS — I10 Essential (primary) hypertension: Secondary | ICD-10-CM | POA: Diagnosis not present

## 2020-09-21 DIAGNOSIS — E559 Vitamin D deficiency, unspecified: Secondary | ICD-10-CM | POA: Diagnosis not present

## 2020-09-21 DIAGNOSIS — Z8673 Personal history of transient ischemic attack (TIA), and cerebral infarction without residual deficits: Secondary | ICD-10-CM | POA: Diagnosis not present

## 2020-09-21 DIAGNOSIS — M5410 Radiculopathy, site unspecified: Secondary | ICD-10-CM | POA: Diagnosis not present

## 2020-09-21 DIAGNOSIS — I350 Nonrheumatic aortic (valve) stenosis: Secondary | ICD-10-CM | POA: Diagnosis not present

## 2020-09-21 DIAGNOSIS — G629 Polyneuropathy, unspecified: Secondary | ICD-10-CM | POA: Diagnosis not present

## 2020-09-21 DIAGNOSIS — E785 Hyperlipidemia, unspecified: Secondary | ICD-10-CM | POA: Diagnosis not present

## 2020-09-21 DIAGNOSIS — E079 Disorder of thyroid, unspecified: Secondary | ICD-10-CM | POA: Diagnosis not present

## 2020-09-21 DIAGNOSIS — F419 Anxiety disorder, unspecified: Secondary | ICD-10-CM | POA: Diagnosis not present

## 2020-09-21 DIAGNOSIS — G43909 Migraine, unspecified, not intractable, without status migrainosus: Secondary | ICD-10-CM | POA: Diagnosis not present

## 2020-09-21 DIAGNOSIS — M545 Low back pain, unspecified: Secondary | ICD-10-CM | POA: Diagnosis not present

## 2020-09-21 DIAGNOSIS — Z9181 History of falling: Secondary | ICD-10-CM | POA: Diagnosis not present

## 2020-09-21 DIAGNOSIS — K219 Gastro-esophageal reflux disease without esophagitis: Secondary | ICD-10-CM | POA: Diagnosis not present

## 2020-09-21 DIAGNOSIS — Z87891 Personal history of nicotine dependence: Secondary | ICD-10-CM | POA: Diagnosis not present

## 2020-09-21 DIAGNOSIS — I10 Essential (primary) hypertension: Secondary | ICD-10-CM | POA: Diagnosis not present

## 2020-09-21 DIAGNOSIS — M170 Bilateral primary osteoarthritis of knee: Secondary | ICD-10-CM | POA: Diagnosis not present

## 2020-09-22 DIAGNOSIS — M545 Low back pain, unspecified: Secondary | ICD-10-CM | POA: Diagnosis not present

## 2020-09-22 DIAGNOSIS — I10 Essential (primary) hypertension: Secondary | ICD-10-CM | POA: Diagnosis not present

## 2020-09-22 DIAGNOSIS — G43909 Migraine, unspecified, not intractable, without status migrainosus: Secondary | ICD-10-CM | POA: Diagnosis not present

## 2020-09-22 DIAGNOSIS — K219 Gastro-esophageal reflux disease without esophagitis: Secondary | ICD-10-CM | POA: Diagnosis not present

## 2020-09-22 DIAGNOSIS — I350 Nonrheumatic aortic (valve) stenosis: Secondary | ICD-10-CM | POA: Diagnosis not present

## 2020-09-22 DIAGNOSIS — M170 Bilateral primary osteoarthritis of knee: Secondary | ICD-10-CM | POA: Diagnosis not present

## 2020-09-26 DIAGNOSIS — I350 Nonrheumatic aortic (valve) stenosis: Secondary | ICD-10-CM | POA: Diagnosis not present

## 2020-09-26 DIAGNOSIS — I10 Essential (primary) hypertension: Secondary | ICD-10-CM | POA: Diagnosis not present

## 2020-09-26 DIAGNOSIS — K219 Gastro-esophageal reflux disease without esophagitis: Secondary | ICD-10-CM | POA: Diagnosis not present

## 2020-09-26 DIAGNOSIS — M170 Bilateral primary osteoarthritis of knee: Secondary | ICD-10-CM | POA: Diagnosis not present

## 2020-09-26 DIAGNOSIS — G43909 Migraine, unspecified, not intractable, without status migrainosus: Secondary | ICD-10-CM | POA: Diagnosis not present

## 2020-09-26 DIAGNOSIS — M545 Low back pain, unspecified: Secondary | ICD-10-CM | POA: Diagnosis not present

## 2020-09-27 DIAGNOSIS — M170 Bilateral primary osteoarthritis of knee: Secondary | ICD-10-CM | POA: Diagnosis not present

## 2020-09-27 DIAGNOSIS — M5416 Radiculopathy, lumbar region: Secondary | ICD-10-CM | POA: Diagnosis not present

## 2020-09-27 DIAGNOSIS — M1711 Unilateral primary osteoarthritis, right knee: Secondary | ICD-10-CM | POA: Diagnosis not present

## 2020-09-27 DIAGNOSIS — I10 Essential (primary) hypertension: Secondary | ICD-10-CM | POA: Diagnosis not present

## 2020-09-29 DIAGNOSIS — M545 Low back pain, unspecified: Secondary | ICD-10-CM | POA: Diagnosis not present

## 2020-09-29 DIAGNOSIS — K219 Gastro-esophageal reflux disease without esophagitis: Secondary | ICD-10-CM | POA: Diagnosis not present

## 2020-09-29 DIAGNOSIS — I10 Essential (primary) hypertension: Secondary | ICD-10-CM | POA: Diagnosis not present

## 2020-09-29 DIAGNOSIS — G43909 Migraine, unspecified, not intractable, without status migrainosus: Secondary | ICD-10-CM | POA: Diagnosis not present

## 2020-09-29 DIAGNOSIS — I350 Nonrheumatic aortic (valve) stenosis: Secondary | ICD-10-CM | POA: Diagnosis not present

## 2020-09-29 DIAGNOSIS — M170 Bilateral primary osteoarthritis of knee: Secondary | ICD-10-CM | POA: Diagnosis not present

## 2020-10-04 DIAGNOSIS — M170 Bilateral primary osteoarthritis of knee: Secondary | ICD-10-CM | POA: Diagnosis not present

## 2020-10-04 DIAGNOSIS — M545 Low back pain, unspecified: Secondary | ICD-10-CM | POA: Diagnosis not present

## 2020-10-04 DIAGNOSIS — I350 Nonrheumatic aortic (valve) stenosis: Secondary | ICD-10-CM | POA: Diagnosis not present

## 2020-10-04 DIAGNOSIS — I10 Essential (primary) hypertension: Secondary | ICD-10-CM | POA: Diagnosis not present

## 2020-10-04 DIAGNOSIS — K219 Gastro-esophageal reflux disease without esophagitis: Secondary | ICD-10-CM | POA: Diagnosis not present

## 2020-10-04 DIAGNOSIS — G43909 Migraine, unspecified, not intractable, without status migrainosus: Secondary | ICD-10-CM | POA: Diagnosis not present

## 2020-10-05 DIAGNOSIS — M545 Low back pain, unspecified: Secondary | ICD-10-CM | POA: Diagnosis not present

## 2020-10-05 DIAGNOSIS — I10 Essential (primary) hypertension: Secondary | ICD-10-CM | POA: Diagnosis not present

## 2020-10-05 DIAGNOSIS — I350 Nonrheumatic aortic (valve) stenosis: Secondary | ICD-10-CM | POA: Diagnosis not present

## 2020-10-05 DIAGNOSIS — K219 Gastro-esophageal reflux disease without esophagitis: Secondary | ICD-10-CM | POA: Diagnosis not present

## 2020-10-05 DIAGNOSIS — M170 Bilateral primary osteoarthritis of knee: Secondary | ICD-10-CM | POA: Diagnosis not present

## 2020-10-05 DIAGNOSIS — G43909 Migraine, unspecified, not intractable, without status migrainosus: Secondary | ICD-10-CM | POA: Diagnosis not present

## 2020-10-10 DIAGNOSIS — I10 Essential (primary) hypertension: Secondary | ICD-10-CM | POA: Diagnosis not present

## 2020-10-10 DIAGNOSIS — M170 Bilateral primary osteoarthritis of knee: Secondary | ICD-10-CM | POA: Diagnosis not present

## 2020-10-10 DIAGNOSIS — M545 Low back pain, unspecified: Secondary | ICD-10-CM | POA: Diagnosis not present

## 2020-10-10 DIAGNOSIS — G43909 Migraine, unspecified, not intractable, without status migrainosus: Secondary | ICD-10-CM | POA: Diagnosis not present

## 2020-10-10 DIAGNOSIS — I350 Nonrheumatic aortic (valve) stenosis: Secondary | ICD-10-CM | POA: Diagnosis not present

## 2020-10-10 DIAGNOSIS — K219 Gastro-esophageal reflux disease without esophagitis: Secondary | ICD-10-CM | POA: Diagnosis not present

## 2020-10-12 DIAGNOSIS — M545 Low back pain, unspecified: Secondary | ICD-10-CM | POA: Diagnosis not present

## 2020-10-12 DIAGNOSIS — K219 Gastro-esophageal reflux disease without esophagitis: Secondary | ICD-10-CM | POA: Diagnosis not present

## 2020-10-12 DIAGNOSIS — M170 Bilateral primary osteoarthritis of knee: Secondary | ICD-10-CM | POA: Diagnosis not present

## 2020-10-12 DIAGNOSIS — I10 Essential (primary) hypertension: Secondary | ICD-10-CM | POA: Diagnosis not present

## 2020-10-12 DIAGNOSIS — G43909 Migraine, unspecified, not intractable, without status migrainosus: Secondary | ICD-10-CM | POA: Diagnosis not present

## 2020-10-12 DIAGNOSIS — I350 Nonrheumatic aortic (valve) stenosis: Secondary | ICD-10-CM | POA: Diagnosis not present

## 2020-10-23 DIAGNOSIS — U071 COVID-19: Secondary | ICD-10-CM | POA: Diagnosis not present

## 2020-10-25 DIAGNOSIS — R509 Fever, unspecified: Secondary | ICD-10-CM | POA: Diagnosis not present

## 2020-10-25 DIAGNOSIS — R059 Cough, unspecified: Secondary | ICD-10-CM | POA: Diagnosis not present

## 2020-10-29 DIAGNOSIS — G43909 Migraine, unspecified, not intractable, without status migrainosus: Secondary | ICD-10-CM | POA: Diagnosis not present

## 2020-11-19 DIAGNOSIS — Z8616 Personal history of COVID-19: Secondary | ICD-10-CM | POA: Diagnosis not present

## 2020-12-12 DIAGNOSIS — I10 Essential (primary) hypertension: Secondary | ICD-10-CM | POA: Diagnosis not present

## 2020-12-12 DIAGNOSIS — R531 Weakness: Secondary | ICD-10-CM | POA: Diagnosis not present

## 2020-12-12 DIAGNOSIS — K219 Gastro-esophageal reflux disease without esophagitis: Secondary | ICD-10-CM | POA: Diagnosis not present

## 2020-12-12 DIAGNOSIS — M1990 Unspecified osteoarthritis, unspecified site: Secondary | ICD-10-CM | POA: Diagnosis not present

## 2020-12-12 DIAGNOSIS — Z8616 Personal history of COVID-19: Secondary | ICD-10-CM | POA: Diagnosis not present

## 2020-12-12 DIAGNOSIS — R0602 Shortness of breath: Secondary | ICD-10-CM | POA: Diagnosis not present

## 2020-12-12 DIAGNOSIS — U099 Post covid-19 condition, unspecified: Secondary | ICD-10-CM | POA: Diagnosis not present

## 2020-12-12 DIAGNOSIS — E039 Hypothyroidism, unspecified: Secondary | ICD-10-CM | POA: Diagnosis not present

## 2020-12-12 DIAGNOSIS — R5383 Other fatigue: Secondary | ICD-10-CM | POA: Diagnosis not present

## 2020-12-12 DIAGNOSIS — I639 Cerebral infarction, unspecified: Secondary | ICD-10-CM | POA: Diagnosis not present

## 2020-12-12 DIAGNOSIS — E785 Hyperlipidemia, unspecified: Secondary | ICD-10-CM | POA: Diagnosis not present

## 2020-12-26 DIAGNOSIS — Z9181 History of falling: Secondary | ICD-10-CM | POA: Diagnosis not present

## 2020-12-26 DIAGNOSIS — E785 Hyperlipidemia, unspecified: Secondary | ICD-10-CM | POA: Diagnosis not present

## 2020-12-26 DIAGNOSIS — E039 Hypothyroidism, unspecified: Secondary | ICD-10-CM | POA: Diagnosis not present

## 2020-12-26 DIAGNOSIS — Z8673 Personal history of transient ischemic attack (TIA), and cerebral infarction without residual deficits: Secondary | ICD-10-CM | POA: Diagnosis not present

## 2020-12-26 DIAGNOSIS — Z87891 Personal history of nicotine dependence: Secondary | ICD-10-CM | POA: Diagnosis not present

## 2020-12-26 DIAGNOSIS — K219 Gastro-esophageal reflux disease without esophagitis: Secondary | ICD-10-CM | POA: Diagnosis not present

## 2020-12-26 DIAGNOSIS — R351 Nocturia: Secondary | ICD-10-CM | POA: Diagnosis not present

## 2020-12-26 DIAGNOSIS — R0602 Shortness of breath: Secondary | ICD-10-CM | POA: Diagnosis not present

## 2020-12-26 DIAGNOSIS — M1990 Unspecified osteoarthritis, unspecified site: Secondary | ICD-10-CM | POA: Diagnosis not present

## 2020-12-26 DIAGNOSIS — U099 Post covid-19 condition, unspecified: Secondary | ICD-10-CM | POA: Diagnosis not present

## 2020-12-26 DIAGNOSIS — R5383 Other fatigue: Secondary | ICD-10-CM | POA: Diagnosis not present

## 2020-12-26 DIAGNOSIS — U071 COVID-19: Secondary | ICD-10-CM | POA: Diagnosis not present

## 2020-12-26 DIAGNOSIS — I10 Essential (primary) hypertension: Secondary | ICD-10-CM | POA: Diagnosis not present

## 2020-12-28 IMAGING — CT CT HEAD WITHOUT CONTRAST
3 series · 15 of 47 positions shown, 18 images · non-contrast
Comparison: CTA head and neck 07/30/2018 and earlier.

CLINICAL DATA: 77-year-old female with left side numbness, multiple
prior strokes. History of angioplastied basilar artery, right
vertebral artery occlusion, right ICA stenosis.

EXAM:
CT HEAD WITHOUT CONTRAST
TECHNIQUE: Contiguous axial images were obtained from the base of the skull
through the vertex without intravenous contrast.

[Series 2: head wo · axial · 0.45mm/px · z∈[-172,-47]mm · 9 of 31 slices shown, 12 images]
[im 3/31  brain]
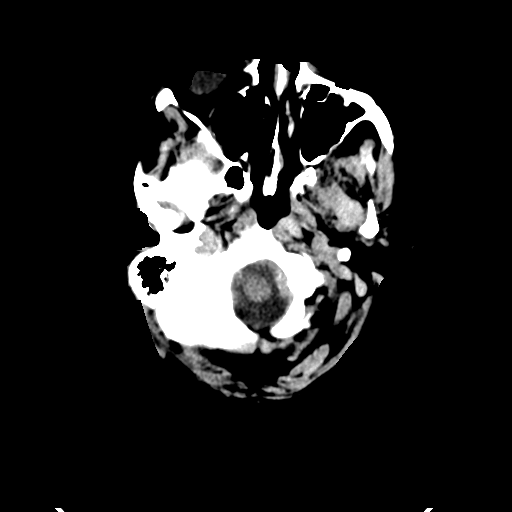
[im 3/31  bone]
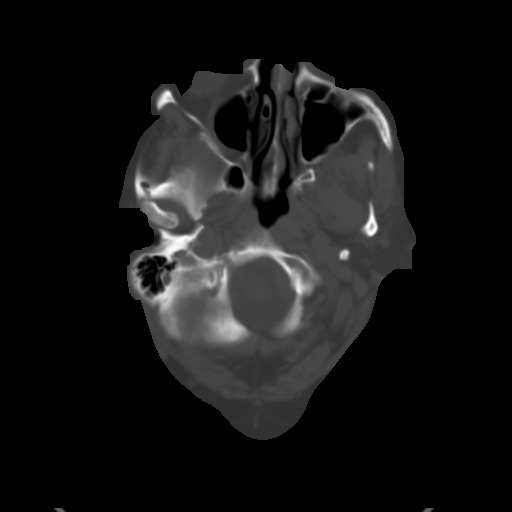
[im 6/31  brain]
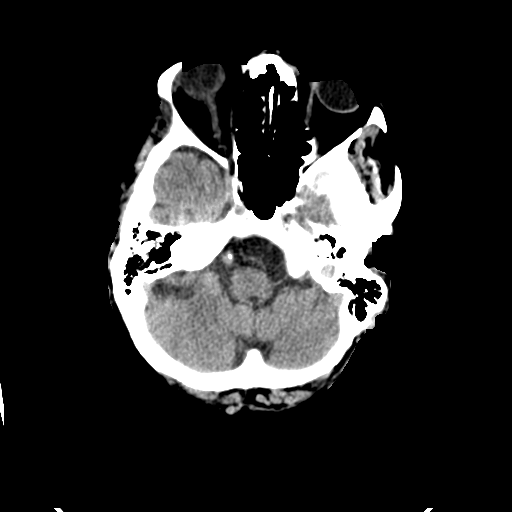
[im 9/31  brain]
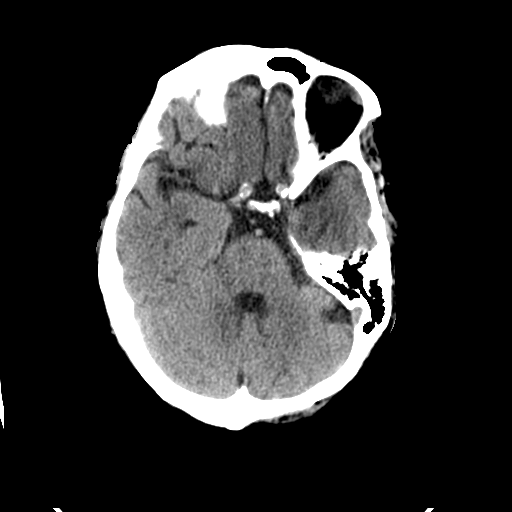
[im 12/31  brain]
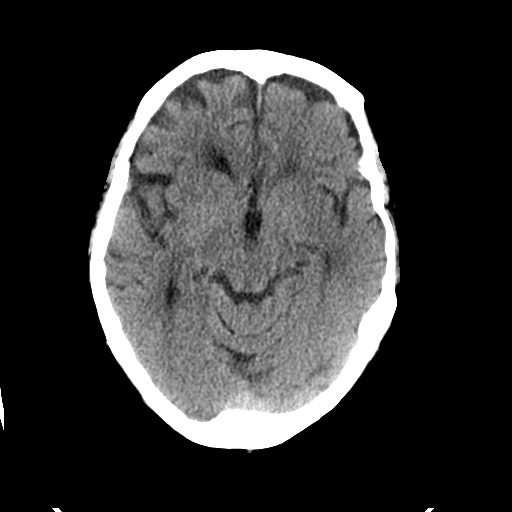
[im 16/31  brain]
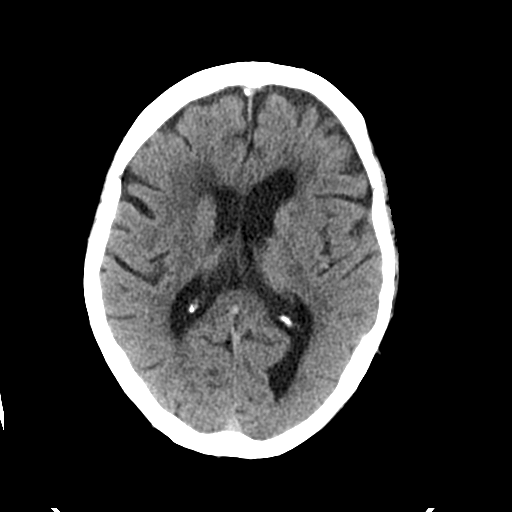
[im 16/31  bone]
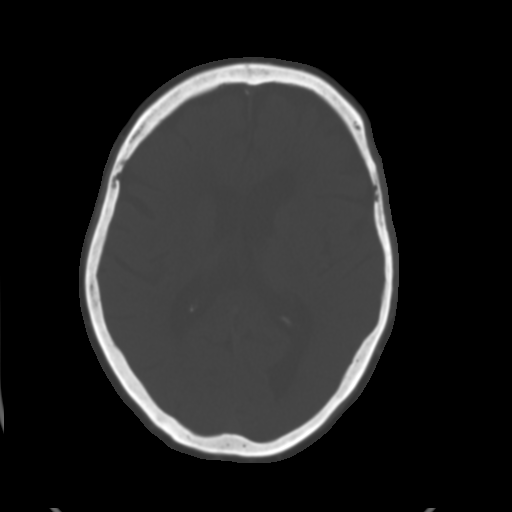
[im 19/31  brain]
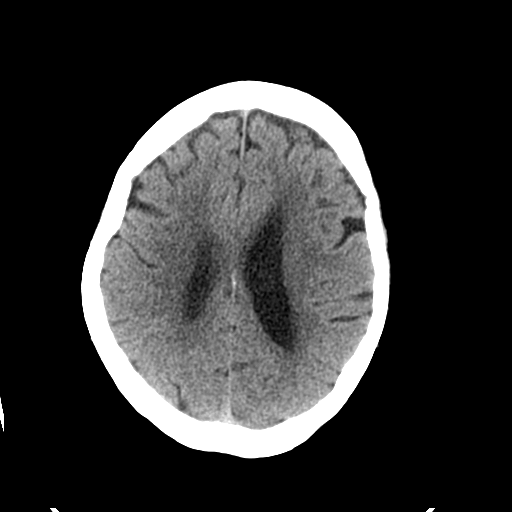
[im 22/31  brain]
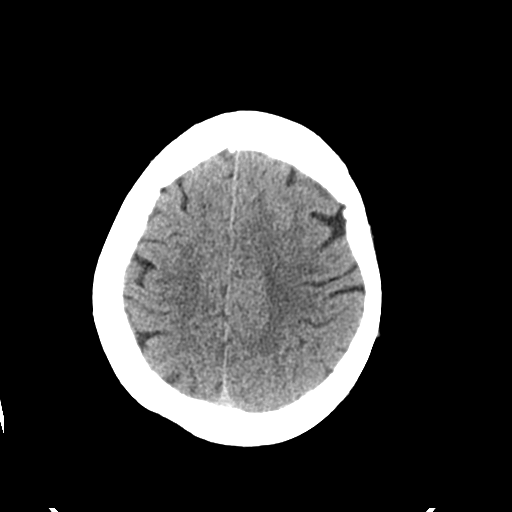
[im 25/31  brain]
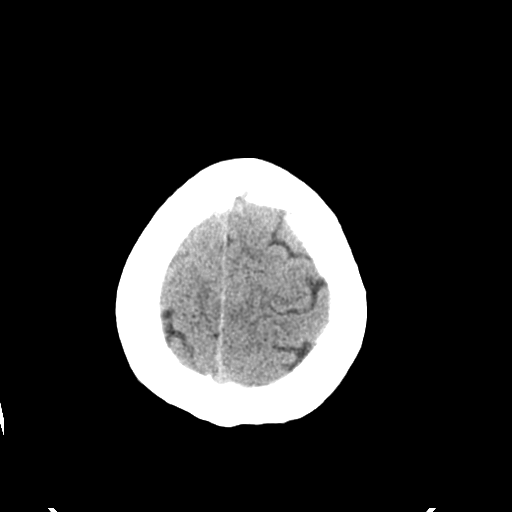
[im 28/31  brain]
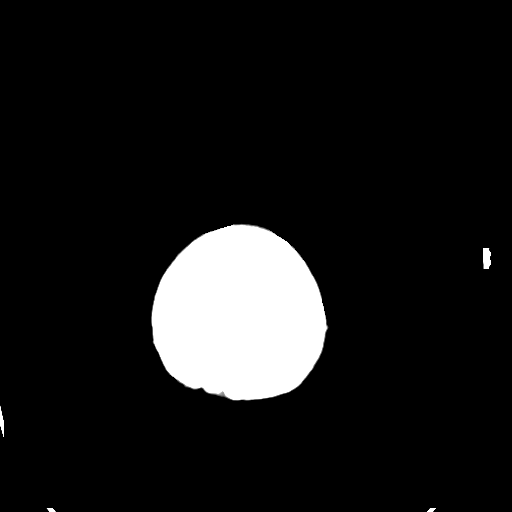
[im 28/31  bone]
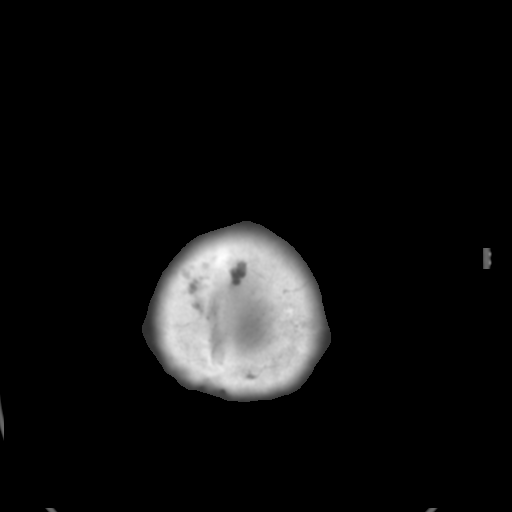

[Series 5: coronal soft tissue · coronal · 0.30mm/px · 3 of 68 slices shown]
[im 23/68  brain]
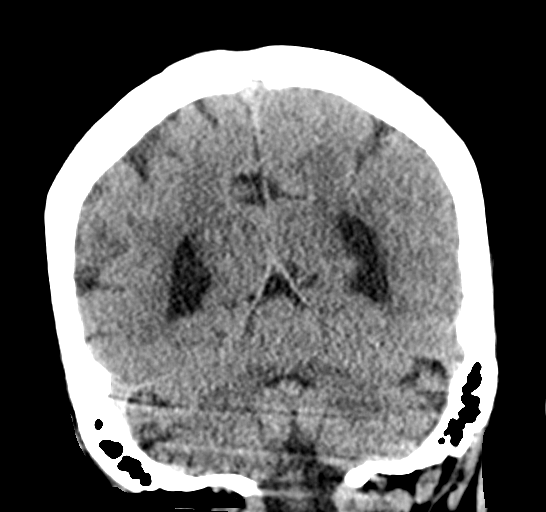
[im 30/68  brain]
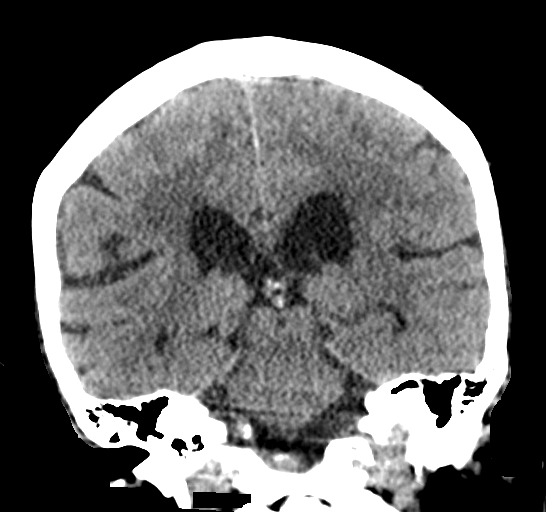
[im 38/68  brain]
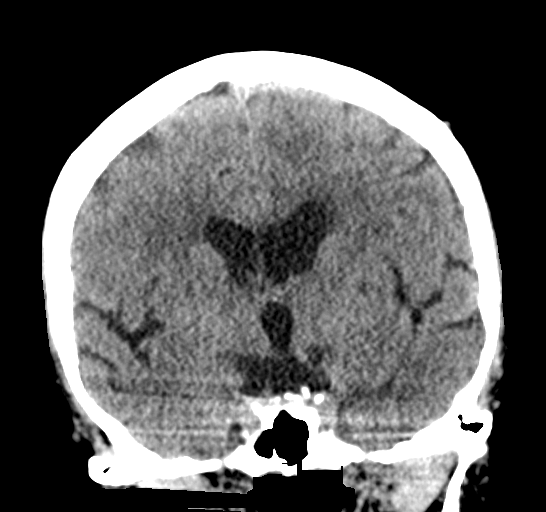

[Series 6: sagittal soft tissue · sagittal · 0.32mm/px · 3 of 53 slices shown]
[im 18/53  brain]
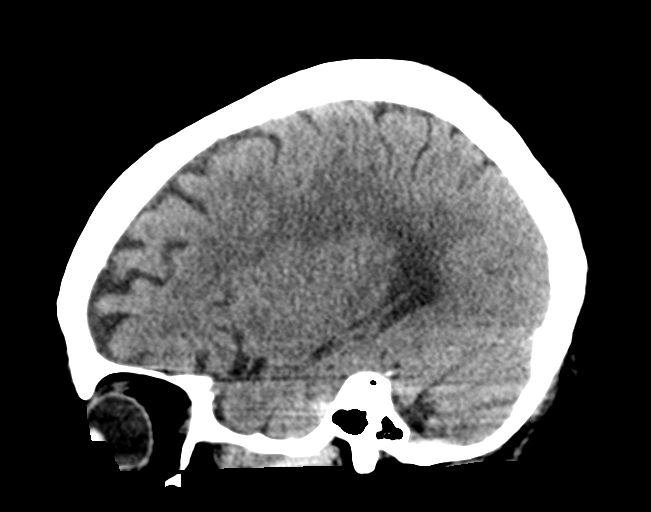
[im 27/53  brain]
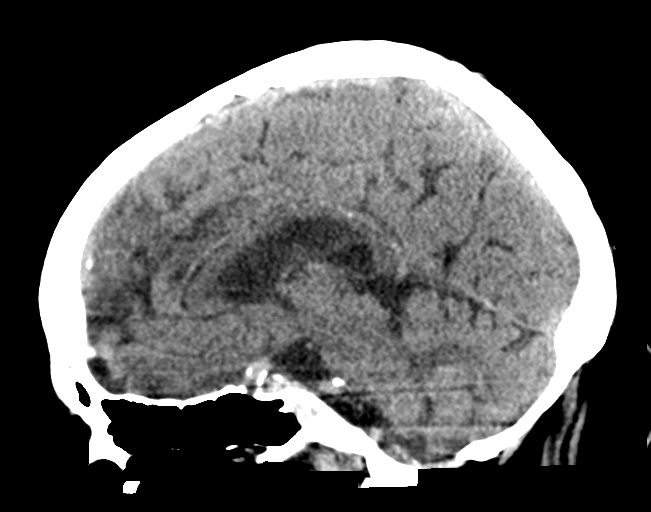
[im 35/53  brain]
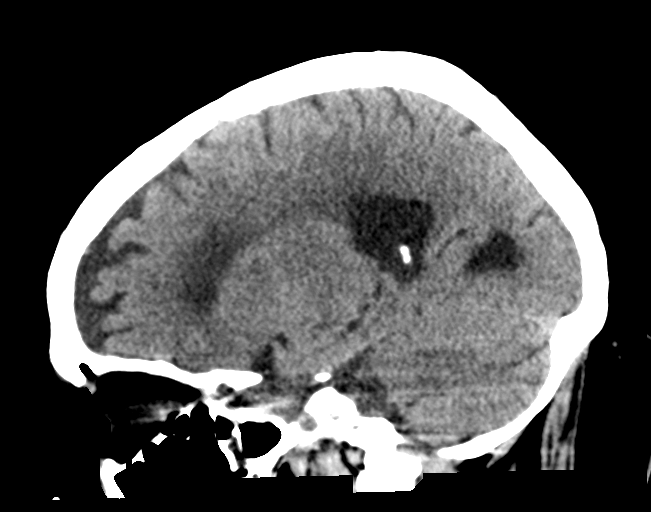

[15 of 47 positions shown; findings below may reference images not displayed]

FINDINGS: Brain: Stable cerebral volume. No midline shift, mass effect, or
evidence of intracranial mass lesion. No ventriculomegaly. Small
chronic cerebellar infarcts. Chronic cerebral white matter and right
deep gray matter heterogeneity. Stable mild cortical
encephalomalacia in the left occipital pole.

No midline shift, ventriculomegaly, mass effect, evidence of mass
lesion, intracranial hemorrhage or evidence of cortically based
acute infarction.

Vascular: Calcified atherosclerosis at the skull base. No suspicious
intracranial vascular hyperdensity.

Skull: No acute osseous abnormality identified.

Sinuses/Orbits: Visualized paranasal sinuses and mastoids are stable
and well pneumatized.

Other: No acute orbit or scalp soft tissue finding.
IMPRESSION: 1. No acute intracranial abnormality.
2. Stable non contrast CT appearance of chronic small and
medium-sized vessel ischemic disease since [DATE].

## 2020-12-29 DIAGNOSIS — I6521 Occlusion and stenosis of right carotid artery: Secondary | ICD-10-CM | POA: Diagnosis not present

## 2020-12-29 DIAGNOSIS — I639 Cerebral infarction, unspecified: Secondary | ICD-10-CM | POA: Diagnosis not present

## 2020-12-29 DIAGNOSIS — I651 Occlusion and stenosis of basilar artery: Secondary | ICD-10-CM | POA: Diagnosis not present

## 2020-12-29 DIAGNOSIS — I6501 Occlusion and stenosis of right vertebral artery: Secondary | ICD-10-CM | POA: Diagnosis not present

## 2020-12-29 DIAGNOSIS — I8290 Acute embolism and thrombosis of unspecified vein: Secondary | ICD-10-CM | POA: Diagnosis not present

## 2020-12-29 DIAGNOSIS — R269 Unspecified abnormalities of gait and mobility: Secondary | ICD-10-CM | POA: Diagnosis not present

## 2021-01-02 DIAGNOSIS — R0602 Shortness of breath: Secondary | ICD-10-CM | POA: Diagnosis not present

## 2021-01-02 DIAGNOSIS — E039 Hypothyroidism, unspecified: Secondary | ICD-10-CM | POA: Diagnosis not present

## 2021-01-02 DIAGNOSIS — U099 Post covid-19 condition, unspecified: Secondary | ICD-10-CM | POA: Diagnosis not present

## 2021-01-02 DIAGNOSIS — R351 Nocturia: Secondary | ICD-10-CM | POA: Diagnosis not present

## 2021-01-02 DIAGNOSIS — U071 COVID-19: Secondary | ICD-10-CM | POA: Diagnosis not present

## 2021-01-02 DIAGNOSIS — R5383 Other fatigue: Secondary | ICD-10-CM | POA: Diagnosis not present

## 2021-01-03 DIAGNOSIS — J9811 Atelectasis: Secondary | ICD-10-CM | POA: Diagnosis not present

## 2021-01-03 DIAGNOSIS — J986 Disorders of diaphragm: Secondary | ICD-10-CM | POA: Diagnosis not present

## 2021-01-03 DIAGNOSIS — I771 Stricture of artery: Secondary | ICD-10-CM | POA: Diagnosis not present

## 2021-01-05 DIAGNOSIS — U099 Post covid-19 condition, unspecified: Secondary | ICD-10-CM | POA: Diagnosis not present

## 2021-01-05 DIAGNOSIS — E039 Hypothyroidism, unspecified: Secondary | ICD-10-CM | POA: Diagnosis not present

## 2021-01-05 DIAGNOSIS — R351 Nocturia: Secondary | ICD-10-CM | POA: Diagnosis not present

## 2021-01-05 DIAGNOSIS — R0602 Shortness of breath: Secondary | ICD-10-CM | POA: Diagnosis not present

## 2021-01-05 DIAGNOSIS — U071 COVID-19: Secondary | ICD-10-CM | POA: Diagnosis not present

## 2021-01-05 DIAGNOSIS — R5383 Other fatigue: Secondary | ICD-10-CM | POA: Diagnosis not present

## 2021-01-09 DIAGNOSIS — R5383 Other fatigue: Secondary | ICD-10-CM | POA: Diagnosis not present

## 2021-01-09 DIAGNOSIS — R0602 Shortness of breath: Secondary | ICD-10-CM | POA: Diagnosis not present

## 2021-01-09 DIAGNOSIS — U099 Post covid-19 condition, unspecified: Secondary | ICD-10-CM | POA: Diagnosis not present

## 2021-01-09 DIAGNOSIS — U071 COVID-19: Secondary | ICD-10-CM | POA: Diagnosis not present

## 2021-01-09 DIAGNOSIS — R351 Nocturia: Secondary | ICD-10-CM | POA: Diagnosis not present

## 2021-01-09 DIAGNOSIS — E039 Hypothyroidism, unspecified: Secondary | ICD-10-CM | POA: Diagnosis not present

## 2021-01-11 DIAGNOSIS — U099 Post covid-19 condition, unspecified: Secondary | ICD-10-CM | POA: Diagnosis not present

## 2021-01-11 DIAGNOSIS — R5383 Other fatigue: Secondary | ICD-10-CM | POA: Diagnosis not present

## 2021-01-11 DIAGNOSIS — R351 Nocturia: Secondary | ICD-10-CM | POA: Diagnosis not present

## 2021-01-11 DIAGNOSIS — R0602 Shortness of breath: Secondary | ICD-10-CM | POA: Diagnosis not present

## 2021-01-11 DIAGNOSIS — U071 COVID-19: Secondary | ICD-10-CM | POA: Diagnosis not present

## 2021-01-11 DIAGNOSIS — E039 Hypothyroidism, unspecified: Secondary | ICD-10-CM | POA: Diagnosis not present

## 2021-01-15 DIAGNOSIS — R0602 Shortness of breath: Secondary | ICD-10-CM | POA: Diagnosis not present

## 2021-01-15 DIAGNOSIS — U071 COVID-19: Secondary | ICD-10-CM | POA: Diagnosis not present

## 2021-01-15 DIAGNOSIS — R5383 Other fatigue: Secondary | ICD-10-CM | POA: Diagnosis not present

## 2021-01-15 DIAGNOSIS — E039 Hypothyroidism, unspecified: Secondary | ICD-10-CM | POA: Diagnosis not present

## 2021-01-15 DIAGNOSIS — U099 Post covid-19 condition, unspecified: Secondary | ICD-10-CM | POA: Diagnosis not present

## 2021-01-15 DIAGNOSIS — R351 Nocturia: Secondary | ICD-10-CM | POA: Diagnosis not present

## 2021-01-20 DIAGNOSIS — E039 Hypothyroidism, unspecified: Secondary | ICD-10-CM | POA: Diagnosis not present

## 2021-01-20 DIAGNOSIS — R351 Nocturia: Secondary | ICD-10-CM | POA: Diagnosis not present

## 2021-01-20 DIAGNOSIS — U071 COVID-19: Secondary | ICD-10-CM | POA: Diagnosis not present

## 2021-01-20 DIAGNOSIS — R0602 Shortness of breath: Secondary | ICD-10-CM | POA: Diagnosis not present

## 2021-01-20 DIAGNOSIS — U099 Post covid-19 condition, unspecified: Secondary | ICD-10-CM | POA: Diagnosis not present

## 2021-01-20 DIAGNOSIS — R5383 Other fatigue: Secondary | ICD-10-CM | POA: Diagnosis not present

## 2021-01-23 DIAGNOSIS — R5383 Other fatigue: Secondary | ICD-10-CM | POA: Diagnosis not present

## 2021-01-23 DIAGNOSIS — U099 Post covid-19 condition, unspecified: Secondary | ICD-10-CM | POA: Diagnosis not present

## 2021-01-23 DIAGNOSIS — G629 Polyneuropathy, unspecified: Secondary | ICD-10-CM | POA: Diagnosis not present

## 2021-01-23 DIAGNOSIS — R351 Nocturia: Secondary | ICD-10-CM | POA: Diagnosis not present

## 2021-01-23 DIAGNOSIS — M5416 Radiculopathy, lumbar region: Secondary | ICD-10-CM | POA: Diagnosis not present

## 2021-01-23 DIAGNOSIS — I10 Essential (primary) hypertension: Secondary | ICD-10-CM | POA: Diagnosis not present

## 2021-01-23 DIAGNOSIS — E039 Hypothyroidism, unspecified: Secondary | ICD-10-CM | POA: Diagnosis not present

## 2021-01-23 DIAGNOSIS — U071 COVID-19: Secondary | ICD-10-CM | POA: Diagnosis not present

## 2021-01-23 DIAGNOSIS — M170 Bilateral primary osteoarthritis of knee: Secondary | ICD-10-CM | POA: Diagnosis not present

## 2021-01-23 DIAGNOSIS — R0602 Shortness of breath: Secondary | ICD-10-CM | POA: Diagnosis not present

## 2021-01-24 DIAGNOSIS — R5383 Other fatigue: Secondary | ICD-10-CM | POA: Diagnosis not present

## 2021-01-24 DIAGNOSIS — U071 COVID-19: Secondary | ICD-10-CM | POA: Diagnosis not present

## 2021-01-24 DIAGNOSIS — R0602 Shortness of breath: Secondary | ICD-10-CM | POA: Diagnosis not present

## 2021-01-24 DIAGNOSIS — E039 Hypothyroidism, unspecified: Secondary | ICD-10-CM | POA: Diagnosis not present

## 2021-01-24 DIAGNOSIS — R351 Nocturia: Secondary | ICD-10-CM | POA: Diagnosis not present

## 2021-01-24 DIAGNOSIS — U099 Post covid-19 condition, unspecified: Secondary | ICD-10-CM | POA: Diagnosis not present

## 2021-01-25 DIAGNOSIS — U071 COVID-19: Secondary | ICD-10-CM | POA: Diagnosis not present

## 2021-01-25 DIAGNOSIS — E039 Hypothyroidism, unspecified: Secondary | ICD-10-CM | POA: Diagnosis not present

## 2021-01-25 DIAGNOSIS — R5383 Other fatigue: Secondary | ICD-10-CM | POA: Diagnosis not present

## 2021-01-25 DIAGNOSIS — Z87891 Personal history of nicotine dependence: Secondary | ICD-10-CM | POA: Diagnosis not present

## 2021-01-25 DIAGNOSIS — K219 Gastro-esophageal reflux disease without esophagitis: Secondary | ICD-10-CM | POA: Diagnosis not present

## 2021-01-25 DIAGNOSIS — R0602 Shortness of breath: Secondary | ICD-10-CM | POA: Diagnosis not present

## 2021-01-25 DIAGNOSIS — Z8673 Personal history of transient ischemic attack (TIA), and cerebral infarction without residual deficits: Secondary | ICD-10-CM | POA: Diagnosis not present

## 2021-01-25 DIAGNOSIS — U099 Post covid-19 condition, unspecified: Secondary | ICD-10-CM | POA: Diagnosis not present

## 2021-01-25 DIAGNOSIS — M1990 Unspecified osteoarthritis, unspecified site: Secondary | ICD-10-CM | POA: Diagnosis not present

## 2021-01-25 DIAGNOSIS — E785 Hyperlipidemia, unspecified: Secondary | ICD-10-CM | POA: Diagnosis not present

## 2021-01-25 DIAGNOSIS — R351 Nocturia: Secondary | ICD-10-CM | POA: Diagnosis not present

## 2021-01-25 DIAGNOSIS — Z9181 History of falling: Secondary | ICD-10-CM | POA: Diagnosis not present

## 2021-01-25 DIAGNOSIS — I10 Essential (primary) hypertension: Secondary | ICD-10-CM | POA: Diagnosis not present

## 2021-02-01 DIAGNOSIS — R351 Nocturia: Secondary | ICD-10-CM | POA: Diagnosis not present

## 2021-02-01 DIAGNOSIS — U071 COVID-19: Secondary | ICD-10-CM | POA: Diagnosis not present

## 2021-02-01 DIAGNOSIS — R0602 Shortness of breath: Secondary | ICD-10-CM | POA: Diagnosis not present

## 2021-02-01 DIAGNOSIS — U099 Post covid-19 condition, unspecified: Secondary | ICD-10-CM | POA: Diagnosis not present

## 2021-02-01 DIAGNOSIS — E039 Hypothyroidism, unspecified: Secondary | ICD-10-CM | POA: Diagnosis not present

## 2021-02-01 DIAGNOSIS — R5383 Other fatigue: Secondary | ICD-10-CM | POA: Diagnosis not present

## 2021-02-02 DIAGNOSIS — R5383 Other fatigue: Secondary | ICD-10-CM | POA: Diagnosis not present

## 2021-02-02 DIAGNOSIS — U071 COVID-19: Secondary | ICD-10-CM | POA: Diagnosis not present

## 2021-02-02 DIAGNOSIS — R0602 Shortness of breath: Secondary | ICD-10-CM | POA: Diagnosis not present

## 2021-02-02 DIAGNOSIS — E039 Hypothyroidism, unspecified: Secondary | ICD-10-CM | POA: Diagnosis not present

## 2021-02-02 DIAGNOSIS — R351 Nocturia: Secondary | ICD-10-CM | POA: Diagnosis not present

## 2021-02-02 DIAGNOSIS — U099 Post covid-19 condition, unspecified: Secondary | ICD-10-CM | POA: Diagnosis not present

## 2021-02-06 DIAGNOSIS — E039 Hypothyroidism, unspecified: Secondary | ICD-10-CM | POA: Diagnosis not present

## 2021-02-06 DIAGNOSIS — R0602 Shortness of breath: Secondary | ICD-10-CM | POA: Diagnosis not present

## 2021-02-06 DIAGNOSIS — U099 Post covid-19 condition, unspecified: Secondary | ICD-10-CM | POA: Diagnosis not present

## 2021-02-06 DIAGNOSIS — U071 COVID-19: Secondary | ICD-10-CM | POA: Diagnosis not present

## 2021-02-06 DIAGNOSIS — R5383 Other fatigue: Secondary | ICD-10-CM | POA: Diagnosis not present

## 2021-02-06 DIAGNOSIS — R351 Nocturia: Secondary | ICD-10-CM | POA: Diagnosis not present

## 2021-02-09 DIAGNOSIS — R351 Nocturia: Secondary | ICD-10-CM | POA: Diagnosis not present

## 2021-02-09 DIAGNOSIS — R0602 Shortness of breath: Secondary | ICD-10-CM | POA: Diagnosis not present

## 2021-02-09 DIAGNOSIS — U099 Post covid-19 condition, unspecified: Secondary | ICD-10-CM | POA: Diagnosis not present

## 2021-02-09 DIAGNOSIS — U071 COVID-19: Secondary | ICD-10-CM | POA: Diagnosis not present

## 2021-02-09 DIAGNOSIS — E039 Hypothyroidism, unspecified: Secondary | ICD-10-CM | POA: Diagnosis not present

## 2021-02-09 DIAGNOSIS — R5383 Other fatigue: Secondary | ICD-10-CM | POA: Diagnosis not present

## 2021-02-13 DIAGNOSIS — E039 Hypothyroidism, unspecified: Secondary | ICD-10-CM | POA: Diagnosis not present

## 2021-02-13 DIAGNOSIS — U071 COVID-19: Secondary | ICD-10-CM | POA: Diagnosis not present

## 2021-02-13 DIAGNOSIS — R351 Nocturia: Secondary | ICD-10-CM | POA: Diagnosis not present

## 2021-02-13 DIAGNOSIS — U099 Post covid-19 condition, unspecified: Secondary | ICD-10-CM | POA: Diagnosis not present

## 2021-02-13 DIAGNOSIS — R5383 Other fatigue: Secondary | ICD-10-CM | POA: Diagnosis not present

## 2021-02-13 DIAGNOSIS — R0602 Shortness of breath: Secondary | ICD-10-CM | POA: Diagnosis not present

## 2021-02-15 DIAGNOSIS — R5383 Other fatigue: Secondary | ICD-10-CM | POA: Diagnosis not present

## 2021-02-15 DIAGNOSIS — U071 COVID-19: Secondary | ICD-10-CM | POA: Diagnosis not present

## 2021-02-15 DIAGNOSIS — U099 Post covid-19 condition, unspecified: Secondary | ICD-10-CM | POA: Diagnosis not present

## 2021-02-15 DIAGNOSIS — E039 Hypothyroidism, unspecified: Secondary | ICD-10-CM | POA: Diagnosis not present

## 2021-02-15 DIAGNOSIS — R351 Nocturia: Secondary | ICD-10-CM | POA: Diagnosis not present

## 2021-02-15 DIAGNOSIS — R0602 Shortness of breath: Secondary | ICD-10-CM | POA: Diagnosis not present

## 2021-02-20 DIAGNOSIS — R0602 Shortness of breath: Secondary | ICD-10-CM | POA: Diagnosis not present

## 2021-02-20 DIAGNOSIS — E039 Hypothyroidism, unspecified: Secondary | ICD-10-CM | POA: Diagnosis not present

## 2021-02-20 DIAGNOSIS — U099 Post covid-19 condition, unspecified: Secondary | ICD-10-CM | POA: Diagnosis not present

## 2021-02-20 DIAGNOSIS — U071 COVID-19: Secondary | ICD-10-CM | POA: Diagnosis not present

## 2021-02-20 DIAGNOSIS — R351 Nocturia: Secondary | ICD-10-CM | POA: Diagnosis not present

## 2021-02-20 DIAGNOSIS — R5383 Other fatigue: Secondary | ICD-10-CM | POA: Diagnosis not present

## 2021-02-22 DIAGNOSIS — U099 Post covid-19 condition, unspecified: Secondary | ICD-10-CM | POA: Diagnosis not present

## 2021-02-22 DIAGNOSIS — E039 Hypothyroidism, unspecified: Secondary | ICD-10-CM | POA: Diagnosis not present

## 2021-02-22 DIAGNOSIS — R0602 Shortness of breath: Secondary | ICD-10-CM | POA: Diagnosis not present

## 2021-02-22 DIAGNOSIS — R5383 Other fatigue: Secondary | ICD-10-CM | POA: Diagnosis not present

## 2021-02-22 DIAGNOSIS — R351 Nocturia: Secondary | ICD-10-CM | POA: Diagnosis not present

## 2021-02-22 DIAGNOSIS — U071 COVID-19: Secondary | ICD-10-CM | POA: Diagnosis not present

## 2021-02-24 DIAGNOSIS — M1990 Unspecified osteoarthritis, unspecified site: Secondary | ICD-10-CM | POA: Diagnosis not present

## 2021-02-24 DIAGNOSIS — R0602 Shortness of breath: Secondary | ICD-10-CM | POA: Diagnosis not present

## 2021-02-24 DIAGNOSIS — Z9181 History of falling: Secondary | ICD-10-CM | POA: Diagnosis not present

## 2021-02-24 DIAGNOSIS — I69393 Ataxia following cerebral infarction: Secondary | ICD-10-CM | POA: Diagnosis not present

## 2021-02-24 DIAGNOSIS — R41841 Cognitive communication deficit: Secondary | ICD-10-CM | POA: Diagnosis not present

## 2021-02-24 DIAGNOSIS — Z7982 Long term (current) use of aspirin: Secondary | ICD-10-CM | POA: Diagnosis not present

## 2021-02-24 DIAGNOSIS — I69398 Other sequelae of cerebral infarction: Secondary | ICD-10-CM | POA: Diagnosis not present

## 2021-02-24 DIAGNOSIS — E785 Hyperlipidemia, unspecified: Secondary | ICD-10-CM | POA: Diagnosis not present

## 2021-02-24 DIAGNOSIS — R531 Weakness: Secondary | ICD-10-CM | POA: Diagnosis not present

## 2021-02-24 DIAGNOSIS — I69320 Aphasia following cerebral infarction: Secondary | ICD-10-CM | POA: Diagnosis not present

## 2021-02-24 DIAGNOSIS — K219 Gastro-esophageal reflux disease without esophagitis: Secondary | ICD-10-CM | POA: Diagnosis not present

## 2021-02-24 DIAGNOSIS — U099 Post covid-19 condition, unspecified: Secondary | ICD-10-CM | POA: Diagnosis not present

## 2021-02-24 DIAGNOSIS — R351 Nocturia: Secondary | ICD-10-CM | POA: Diagnosis not present

## 2021-02-24 DIAGNOSIS — I10 Essential (primary) hypertension: Secondary | ICD-10-CM | POA: Diagnosis not present

## 2021-02-24 DIAGNOSIS — Z87891 Personal history of nicotine dependence: Secondary | ICD-10-CM | POA: Diagnosis not present

## 2021-02-24 DIAGNOSIS — E039 Hypothyroidism, unspecified: Secondary | ICD-10-CM | POA: Diagnosis not present

## 2021-02-24 DIAGNOSIS — R5383 Other fatigue: Secondary | ICD-10-CM | POA: Diagnosis not present

## 2021-02-27 DIAGNOSIS — I69320 Aphasia following cerebral infarction: Secondary | ICD-10-CM | POA: Diagnosis not present

## 2021-02-27 DIAGNOSIS — R531 Weakness: Secondary | ICD-10-CM | POA: Diagnosis not present

## 2021-02-27 DIAGNOSIS — R41841 Cognitive communication deficit: Secondary | ICD-10-CM | POA: Diagnosis not present

## 2021-02-27 DIAGNOSIS — R351 Nocturia: Secondary | ICD-10-CM | POA: Diagnosis not present

## 2021-02-27 DIAGNOSIS — I69393 Ataxia following cerebral infarction: Secondary | ICD-10-CM | POA: Diagnosis not present

## 2021-02-27 DIAGNOSIS — I69398 Other sequelae of cerebral infarction: Secondary | ICD-10-CM | POA: Diagnosis not present

## 2021-03-01 DIAGNOSIS — R531 Weakness: Secondary | ICD-10-CM | POA: Diagnosis not present

## 2021-03-01 DIAGNOSIS — I69398 Other sequelae of cerebral infarction: Secondary | ICD-10-CM | POA: Diagnosis not present

## 2021-03-01 DIAGNOSIS — I69393 Ataxia following cerebral infarction: Secondary | ICD-10-CM | POA: Diagnosis not present

## 2021-03-01 DIAGNOSIS — R351 Nocturia: Secondary | ICD-10-CM | POA: Diagnosis not present

## 2021-03-01 DIAGNOSIS — I69320 Aphasia following cerebral infarction: Secondary | ICD-10-CM | POA: Diagnosis not present

## 2021-03-01 DIAGNOSIS — R41841 Cognitive communication deficit: Secondary | ICD-10-CM | POA: Diagnosis not present

## 2021-03-03 DIAGNOSIS — R531 Weakness: Secondary | ICD-10-CM | POA: Diagnosis not present

## 2021-03-03 DIAGNOSIS — I69320 Aphasia following cerebral infarction: Secondary | ICD-10-CM | POA: Diagnosis not present

## 2021-03-03 DIAGNOSIS — I69393 Ataxia following cerebral infarction: Secondary | ICD-10-CM | POA: Diagnosis not present

## 2021-03-03 DIAGNOSIS — R41841 Cognitive communication deficit: Secondary | ICD-10-CM | POA: Diagnosis not present

## 2021-03-03 DIAGNOSIS — I69398 Other sequelae of cerebral infarction: Secondary | ICD-10-CM | POA: Diagnosis not present

## 2021-03-03 DIAGNOSIS — R351 Nocturia: Secondary | ICD-10-CM | POA: Diagnosis not present

## 2021-03-06 DIAGNOSIS — R531 Weakness: Secondary | ICD-10-CM | POA: Diagnosis not present

## 2021-03-06 DIAGNOSIS — I69393 Ataxia following cerebral infarction: Secondary | ICD-10-CM | POA: Diagnosis not present

## 2021-03-06 DIAGNOSIS — R351 Nocturia: Secondary | ICD-10-CM | POA: Diagnosis not present

## 2021-03-06 DIAGNOSIS — I69398 Other sequelae of cerebral infarction: Secondary | ICD-10-CM | POA: Diagnosis not present

## 2021-03-06 DIAGNOSIS — I69320 Aphasia following cerebral infarction: Secondary | ICD-10-CM | POA: Diagnosis not present

## 2021-03-06 DIAGNOSIS — R41841 Cognitive communication deficit: Secondary | ICD-10-CM | POA: Diagnosis not present

## 2021-03-07 DIAGNOSIS — R531 Weakness: Secondary | ICD-10-CM | POA: Diagnosis not present

## 2021-03-07 DIAGNOSIS — I69393 Ataxia following cerebral infarction: Secondary | ICD-10-CM | POA: Diagnosis not present

## 2021-03-07 DIAGNOSIS — I69320 Aphasia following cerebral infarction: Secondary | ICD-10-CM | POA: Diagnosis not present

## 2021-03-07 DIAGNOSIS — I69398 Other sequelae of cerebral infarction: Secondary | ICD-10-CM | POA: Diagnosis not present

## 2021-03-07 DIAGNOSIS — R351 Nocturia: Secondary | ICD-10-CM | POA: Diagnosis not present

## 2021-03-07 DIAGNOSIS — R41841 Cognitive communication deficit: Secondary | ICD-10-CM | POA: Diagnosis not present

## 2021-03-08 DIAGNOSIS — R351 Nocturia: Secondary | ICD-10-CM | POA: Diagnosis not present

## 2021-03-08 DIAGNOSIS — I69320 Aphasia following cerebral infarction: Secondary | ICD-10-CM | POA: Diagnosis not present

## 2021-03-08 DIAGNOSIS — R41841 Cognitive communication deficit: Secondary | ICD-10-CM | POA: Diagnosis not present

## 2021-03-08 DIAGNOSIS — I69393 Ataxia following cerebral infarction: Secondary | ICD-10-CM | POA: Diagnosis not present

## 2021-03-08 DIAGNOSIS — I69398 Other sequelae of cerebral infarction: Secondary | ICD-10-CM | POA: Diagnosis not present

## 2021-03-08 DIAGNOSIS — R531 Weakness: Secondary | ICD-10-CM | POA: Diagnosis not present

## 2021-03-14 DIAGNOSIS — R41841 Cognitive communication deficit: Secondary | ICD-10-CM | POA: Diagnosis not present

## 2021-03-14 DIAGNOSIS — R531 Weakness: Secondary | ICD-10-CM | POA: Diagnosis not present

## 2021-03-14 DIAGNOSIS — I69393 Ataxia following cerebral infarction: Secondary | ICD-10-CM | POA: Diagnosis not present

## 2021-03-14 DIAGNOSIS — I69320 Aphasia following cerebral infarction: Secondary | ICD-10-CM | POA: Diagnosis not present

## 2021-03-14 DIAGNOSIS — I69398 Other sequelae of cerebral infarction: Secondary | ICD-10-CM | POA: Diagnosis not present

## 2021-03-14 DIAGNOSIS — R351 Nocturia: Secondary | ICD-10-CM | POA: Diagnosis not present

## 2021-03-15 DIAGNOSIS — R41841 Cognitive communication deficit: Secondary | ICD-10-CM | POA: Diagnosis not present

## 2021-03-15 DIAGNOSIS — I69393 Ataxia following cerebral infarction: Secondary | ICD-10-CM | POA: Diagnosis not present

## 2021-03-15 DIAGNOSIS — R351 Nocturia: Secondary | ICD-10-CM | POA: Diagnosis not present

## 2021-03-15 DIAGNOSIS — I69320 Aphasia following cerebral infarction: Secondary | ICD-10-CM | POA: Diagnosis not present

## 2021-03-15 DIAGNOSIS — R531 Weakness: Secondary | ICD-10-CM | POA: Diagnosis not present

## 2021-03-15 DIAGNOSIS — I69398 Other sequelae of cerebral infarction: Secondary | ICD-10-CM | POA: Diagnosis not present

## 2021-03-20 DIAGNOSIS — R351 Nocturia: Secondary | ICD-10-CM | POA: Diagnosis not present

## 2021-03-20 DIAGNOSIS — R41841 Cognitive communication deficit: Secondary | ICD-10-CM | POA: Diagnosis not present

## 2021-03-20 DIAGNOSIS — I69393 Ataxia following cerebral infarction: Secondary | ICD-10-CM | POA: Diagnosis not present

## 2021-03-20 DIAGNOSIS — I69320 Aphasia following cerebral infarction: Secondary | ICD-10-CM | POA: Diagnosis not present

## 2021-03-20 DIAGNOSIS — R531 Weakness: Secondary | ICD-10-CM | POA: Diagnosis not present

## 2021-03-20 DIAGNOSIS — I69398 Other sequelae of cerebral infarction: Secondary | ICD-10-CM | POA: Diagnosis not present

## 2021-03-22 DIAGNOSIS — I69320 Aphasia following cerebral infarction: Secondary | ICD-10-CM | POA: Diagnosis not present

## 2021-03-22 DIAGNOSIS — R531 Weakness: Secondary | ICD-10-CM | POA: Diagnosis not present

## 2021-03-22 DIAGNOSIS — R351 Nocturia: Secondary | ICD-10-CM | POA: Diagnosis not present

## 2021-03-22 DIAGNOSIS — R41841 Cognitive communication deficit: Secondary | ICD-10-CM | POA: Diagnosis not present

## 2021-03-22 DIAGNOSIS — I69393 Ataxia following cerebral infarction: Secondary | ICD-10-CM | POA: Diagnosis not present

## 2021-03-22 DIAGNOSIS — I69398 Other sequelae of cerebral infarction: Secondary | ICD-10-CM | POA: Diagnosis not present

## 2021-03-26 DIAGNOSIS — R5383 Other fatigue: Secondary | ICD-10-CM | POA: Diagnosis not present

## 2021-03-26 DIAGNOSIS — R351 Nocturia: Secondary | ICD-10-CM | POA: Diagnosis not present

## 2021-03-26 DIAGNOSIS — E039 Hypothyroidism, unspecified: Secondary | ICD-10-CM | POA: Diagnosis not present

## 2021-03-26 DIAGNOSIS — U099 Post covid-19 condition, unspecified: Secondary | ICD-10-CM | POA: Diagnosis not present

## 2021-03-26 DIAGNOSIS — I69393 Ataxia following cerebral infarction: Secondary | ICD-10-CM | POA: Diagnosis not present

## 2021-03-26 DIAGNOSIS — M1990 Unspecified osteoarthritis, unspecified site: Secondary | ICD-10-CM | POA: Diagnosis not present

## 2021-03-26 DIAGNOSIS — Z7982 Long term (current) use of aspirin: Secondary | ICD-10-CM | POA: Diagnosis not present

## 2021-03-26 DIAGNOSIS — I69320 Aphasia following cerebral infarction: Secondary | ICD-10-CM | POA: Diagnosis not present

## 2021-03-26 DIAGNOSIS — K219 Gastro-esophageal reflux disease without esophagitis: Secondary | ICD-10-CM | POA: Diagnosis not present

## 2021-03-26 DIAGNOSIS — I10 Essential (primary) hypertension: Secondary | ICD-10-CM | POA: Diagnosis not present

## 2021-03-26 DIAGNOSIS — R531 Weakness: Secondary | ICD-10-CM | POA: Diagnosis not present

## 2021-03-26 DIAGNOSIS — Z9181 History of falling: Secondary | ICD-10-CM | POA: Diagnosis not present

## 2021-03-26 DIAGNOSIS — E785 Hyperlipidemia, unspecified: Secondary | ICD-10-CM | POA: Diagnosis not present

## 2021-03-26 DIAGNOSIS — I69398 Other sequelae of cerebral infarction: Secondary | ICD-10-CM | POA: Diagnosis not present

## 2021-03-26 DIAGNOSIS — R41841 Cognitive communication deficit: Secondary | ICD-10-CM | POA: Diagnosis not present

## 2021-03-26 DIAGNOSIS — Z87891 Personal history of nicotine dependence: Secondary | ICD-10-CM | POA: Diagnosis not present

## 2021-03-26 DIAGNOSIS — R0602 Shortness of breath: Secondary | ICD-10-CM | POA: Diagnosis not present

## 2021-03-27 DIAGNOSIS — R41841 Cognitive communication deficit: Secondary | ICD-10-CM | POA: Diagnosis not present

## 2021-03-27 DIAGNOSIS — R351 Nocturia: Secondary | ICD-10-CM | POA: Diagnosis not present

## 2021-03-27 DIAGNOSIS — I69320 Aphasia following cerebral infarction: Secondary | ICD-10-CM | POA: Diagnosis not present

## 2021-03-27 DIAGNOSIS — R531 Weakness: Secondary | ICD-10-CM | POA: Diagnosis not present

## 2021-03-27 DIAGNOSIS — I69393 Ataxia following cerebral infarction: Secondary | ICD-10-CM | POA: Diagnosis not present

## 2021-03-27 DIAGNOSIS — I69398 Other sequelae of cerebral infarction: Secondary | ICD-10-CM | POA: Diagnosis not present

## 2021-03-28 DIAGNOSIS — R531 Weakness: Secondary | ICD-10-CM | POA: Diagnosis not present

## 2021-03-28 DIAGNOSIS — I69320 Aphasia following cerebral infarction: Secondary | ICD-10-CM | POA: Diagnosis not present

## 2021-03-28 DIAGNOSIS — R351 Nocturia: Secondary | ICD-10-CM | POA: Diagnosis not present

## 2021-03-28 DIAGNOSIS — R41841 Cognitive communication deficit: Secondary | ICD-10-CM | POA: Diagnosis not present

## 2021-03-28 DIAGNOSIS — I69393 Ataxia following cerebral infarction: Secondary | ICD-10-CM | POA: Diagnosis not present

## 2021-03-28 DIAGNOSIS — I69398 Other sequelae of cerebral infarction: Secondary | ICD-10-CM | POA: Diagnosis not present

## 2021-03-29 DIAGNOSIS — R531 Weakness: Secondary | ICD-10-CM | POA: Diagnosis not present

## 2021-03-29 DIAGNOSIS — I69398 Other sequelae of cerebral infarction: Secondary | ICD-10-CM | POA: Diagnosis not present

## 2021-03-29 DIAGNOSIS — I69320 Aphasia following cerebral infarction: Secondary | ICD-10-CM | POA: Diagnosis not present

## 2021-03-29 DIAGNOSIS — R351 Nocturia: Secondary | ICD-10-CM | POA: Diagnosis not present

## 2021-03-29 DIAGNOSIS — R41841 Cognitive communication deficit: Secondary | ICD-10-CM | POA: Diagnosis not present

## 2021-03-29 DIAGNOSIS — I69393 Ataxia following cerebral infarction: Secondary | ICD-10-CM | POA: Diagnosis not present

## 2021-04-10 DIAGNOSIS — R41841 Cognitive communication deficit: Secondary | ICD-10-CM | POA: Diagnosis not present

## 2021-04-10 DIAGNOSIS — R351 Nocturia: Secondary | ICD-10-CM | POA: Diagnosis not present

## 2021-04-10 DIAGNOSIS — R531 Weakness: Secondary | ICD-10-CM | POA: Diagnosis not present

## 2021-04-10 DIAGNOSIS — I69398 Other sequelae of cerebral infarction: Secondary | ICD-10-CM | POA: Diagnosis not present

## 2021-04-10 DIAGNOSIS — I69393 Ataxia following cerebral infarction: Secondary | ICD-10-CM | POA: Diagnosis not present

## 2021-04-10 DIAGNOSIS — I69320 Aphasia following cerebral infarction: Secondary | ICD-10-CM | POA: Diagnosis not present

## 2021-04-18 DIAGNOSIS — I69398 Other sequelae of cerebral infarction: Secondary | ICD-10-CM | POA: Diagnosis not present

## 2021-04-18 DIAGNOSIS — R351 Nocturia: Secondary | ICD-10-CM | POA: Diagnosis not present

## 2021-04-18 DIAGNOSIS — R41841 Cognitive communication deficit: Secondary | ICD-10-CM | POA: Diagnosis not present

## 2021-04-18 DIAGNOSIS — R531 Weakness: Secondary | ICD-10-CM | POA: Diagnosis not present

## 2021-04-18 DIAGNOSIS — I69320 Aphasia following cerebral infarction: Secondary | ICD-10-CM | POA: Diagnosis not present

## 2021-04-18 DIAGNOSIS — I69393 Ataxia following cerebral infarction: Secondary | ICD-10-CM | POA: Diagnosis not present

## 2021-04-30 DIAGNOSIS — R262 Difficulty in walking, not elsewhere classified: Secondary | ICD-10-CM | POA: Diagnosis not present

## 2021-04-30 DIAGNOSIS — R2681 Unsteadiness on feet: Secondary | ICD-10-CM | POA: Diagnosis not present

## 2021-04-30 DIAGNOSIS — Z823 Family history of stroke: Secondary | ICD-10-CM | POA: Diagnosis not present

## 2021-04-30 DIAGNOSIS — U099 Post covid-19 condition, unspecified: Secondary | ICD-10-CM | POA: Diagnosis not present

## 2021-05-01 DIAGNOSIS — M170 Bilateral primary osteoarthritis of knee: Secondary | ICD-10-CM | POA: Diagnosis not present

## 2021-05-01 DIAGNOSIS — I10 Essential (primary) hypertension: Secondary | ICD-10-CM | POA: Diagnosis not present

## 2021-05-31 DIAGNOSIS — Z823 Family history of stroke: Secondary | ICD-10-CM | POA: Diagnosis not present

## 2021-05-31 DIAGNOSIS — R2681 Unsteadiness on feet: Secondary | ICD-10-CM | POA: Diagnosis not present

## 2021-05-31 DIAGNOSIS — R262 Difficulty in walking, not elsewhere classified: Secondary | ICD-10-CM | POA: Diagnosis not present

## 2021-05-31 DIAGNOSIS — U099 Post covid-19 condition, unspecified: Secondary | ICD-10-CM | POA: Diagnosis not present

## 2021-06-05 DIAGNOSIS — R262 Difficulty in walking, not elsewhere classified: Secondary | ICD-10-CM | POA: Diagnosis not present

## 2021-06-05 DIAGNOSIS — U099 Post covid-19 condition, unspecified: Secondary | ICD-10-CM | POA: Diagnosis not present

## 2021-06-05 DIAGNOSIS — R2681 Unsteadiness on feet: Secondary | ICD-10-CM | POA: Diagnosis not present

## 2021-06-05 DIAGNOSIS — Z823 Family history of stroke: Secondary | ICD-10-CM | POA: Diagnosis not present

## 2021-06-22 DIAGNOSIS — R2681 Unsteadiness on feet: Secondary | ICD-10-CM | POA: Diagnosis not present

## 2021-06-22 DIAGNOSIS — U099 Post covid-19 condition, unspecified: Secondary | ICD-10-CM | POA: Diagnosis not present

## 2021-06-22 DIAGNOSIS — Z823 Family history of stroke: Secondary | ICD-10-CM | POA: Diagnosis not present

## 2021-06-22 DIAGNOSIS — R262 Difficulty in walking, not elsewhere classified: Secondary | ICD-10-CM | POA: Diagnosis not present

## 2021-06-26 DIAGNOSIS — U099 Post covid-19 condition, unspecified: Secondary | ICD-10-CM | POA: Diagnosis not present

## 2021-06-26 DIAGNOSIS — R2681 Unsteadiness on feet: Secondary | ICD-10-CM | POA: Diagnosis not present

## 2021-06-26 DIAGNOSIS — R262 Difficulty in walking, not elsewhere classified: Secondary | ICD-10-CM | POA: Diagnosis not present

## 2021-06-26 DIAGNOSIS — Z823 Family history of stroke: Secondary | ICD-10-CM | POA: Diagnosis not present

## 2021-06-28 DIAGNOSIS — R262 Difficulty in walking, not elsewhere classified: Secondary | ICD-10-CM | POA: Diagnosis not present

## 2021-06-28 DIAGNOSIS — U099 Post covid-19 condition, unspecified: Secondary | ICD-10-CM | POA: Diagnosis not present

## 2021-06-28 DIAGNOSIS — Z823 Family history of stroke: Secondary | ICD-10-CM | POA: Diagnosis not present

## 2021-06-28 DIAGNOSIS — R2681 Unsteadiness on feet: Secondary | ICD-10-CM | POA: Diagnosis not present

## 2021-06-29 DIAGNOSIS — R262 Difficulty in walking, not elsewhere classified: Secondary | ICD-10-CM | POA: Diagnosis not present

## 2021-06-29 DIAGNOSIS — Z823 Family history of stroke: Secondary | ICD-10-CM | POA: Diagnosis not present

## 2021-06-29 DIAGNOSIS — U099 Post covid-19 condition, unspecified: Secondary | ICD-10-CM | POA: Diagnosis not present

## 2021-06-29 DIAGNOSIS — R2681 Unsteadiness on feet: Secondary | ICD-10-CM | POA: Diagnosis not present

## 2021-07-03 DIAGNOSIS — R262 Difficulty in walking, not elsewhere classified: Secondary | ICD-10-CM | POA: Diagnosis not present

## 2021-07-03 DIAGNOSIS — Z823 Family history of stroke: Secondary | ICD-10-CM | POA: Diagnosis not present

## 2021-07-03 DIAGNOSIS — R2681 Unsteadiness on feet: Secondary | ICD-10-CM | POA: Diagnosis not present

## 2021-07-03 DIAGNOSIS — U099 Post covid-19 condition, unspecified: Secondary | ICD-10-CM | POA: Diagnosis not present

## 2021-07-04 DIAGNOSIS — U099 Post covid-19 condition, unspecified: Secondary | ICD-10-CM | POA: Diagnosis not present

## 2021-07-04 DIAGNOSIS — Z823 Family history of stroke: Secondary | ICD-10-CM | POA: Diagnosis not present

## 2021-07-04 DIAGNOSIS — R2681 Unsteadiness on feet: Secondary | ICD-10-CM | POA: Diagnosis not present

## 2021-07-04 DIAGNOSIS — R262 Difficulty in walking, not elsewhere classified: Secondary | ICD-10-CM | POA: Diagnosis not present

## 2021-07-05 DIAGNOSIS — U099 Post covid-19 condition, unspecified: Secondary | ICD-10-CM | POA: Diagnosis not present

## 2021-07-05 DIAGNOSIS — R2681 Unsteadiness on feet: Secondary | ICD-10-CM | POA: Diagnosis not present

## 2021-07-05 DIAGNOSIS — Z823 Family history of stroke: Secondary | ICD-10-CM | POA: Diagnosis not present

## 2021-07-05 DIAGNOSIS — R262 Difficulty in walking, not elsewhere classified: Secondary | ICD-10-CM | POA: Diagnosis not present

## 2021-07-10 DIAGNOSIS — U099 Post covid-19 condition, unspecified: Secondary | ICD-10-CM | POA: Diagnosis not present

## 2021-07-10 DIAGNOSIS — R262 Difficulty in walking, not elsewhere classified: Secondary | ICD-10-CM | POA: Diagnosis not present

## 2021-07-10 DIAGNOSIS — R2681 Unsteadiness on feet: Secondary | ICD-10-CM | POA: Diagnosis not present

## 2021-07-10 DIAGNOSIS — Z823 Family history of stroke: Secondary | ICD-10-CM | POA: Diagnosis not present

## 2021-07-11 DIAGNOSIS — Z823 Family history of stroke: Secondary | ICD-10-CM | POA: Diagnosis not present

## 2021-07-11 DIAGNOSIS — U099 Post covid-19 condition, unspecified: Secondary | ICD-10-CM | POA: Diagnosis not present

## 2021-07-11 DIAGNOSIS — R262 Difficulty in walking, not elsewhere classified: Secondary | ICD-10-CM | POA: Diagnosis not present

## 2021-07-11 DIAGNOSIS — R2681 Unsteadiness on feet: Secondary | ICD-10-CM | POA: Diagnosis not present

## 2021-07-13 DIAGNOSIS — R262 Difficulty in walking, not elsewhere classified: Secondary | ICD-10-CM | POA: Diagnosis not present

## 2021-07-13 DIAGNOSIS — U099 Post covid-19 condition, unspecified: Secondary | ICD-10-CM | POA: Diagnosis not present

## 2021-07-13 DIAGNOSIS — Z823 Family history of stroke: Secondary | ICD-10-CM | POA: Diagnosis not present

## 2021-07-13 DIAGNOSIS — R2681 Unsteadiness on feet: Secondary | ICD-10-CM | POA: Diagnosis not present

## 2021-07-17 DIAGNOSIS — Z823 Family history of stroke: Secondary | ICD-10-CM | POA: Diagnosis not present

## 2021-07-17 DIAGNOSIS — R262 Difficulty in walking, not elsewhere classified: Secondary | ICD-10-CM | POA: Diagnosis not present

## 2021-07-17 DIAGNOSIS — R2681 Unsteadiness on feet: Secondary | ICD-10-CM | POA: Diagnosis not present

## 2021-07-17 DIAGNOSIS — U099 Post covid-19 condition, unspecified: Secondary | ICD-10-CM | POA: Diagnosis not present

## 2021-07-18 DIAGNOSIS — R262 Difficulty in walking, not elsewhere classified: Secondary | ICD-10-CM | POA: Diagnosis not present

## 2021-07-18 DIAGNOSIS — Z823 Family history of stroke: Secondary | ICD-10-CM | POA: Diagnosis not present

## 2021-07-18 DIAGNOSIS — U099 Post covid-19 condition, unspecified: Secondary | ICD-10-CM | POA: Diagnosis not present

## 2021-07-18 DIAGNOSIS — R2681 Unsteadiness on feet: Secondary | ICD-10-CM | POA: Diagnosis not present

## 2021-07-20 DIAGNOSIS — Z823 Family history of stroke: Secondary | ICD-10-CM | POA: Diagnosis not present

## 2021-07-20 DIAGNOSIS — U099 Post covid-19 condition, unspecified: Secondary | ICD-10-CM | POA: Diagnosis not present

## 2021-07-20 DIAGNOSIS — R262 Difficulty in walking, not elsewhere classified: Secondary | ICD-10-CM | POA: Diagnosis not present

## 2021-07-20 DIAGNOSIS — R2681 Unsteadiness on feet: Secondary | ICD-10-CM | POA: Diagnosis not present

## 2021-07-24 DIAGNOSIS — R2681 Unsteadiness on feet: Secondary | ICD-10-CM | POA: Diagnosis not present

## 2021-07-24 DIAGNOSIS — R262 Difficulty in walking, not elsewhere classified: Secondary | ICD-10-CM | POA: Diagnosis not present

## 2021-07-24 DIAGNOSIS — Z823 Family history of stroke: Secondary | ICD-10-CM | POA: Diagnosis not present

## 2021-07-24 DIAGNOSIS — U099 Post covid-19 condition, unspecified: Secondary | ICD-10-CM | POA: Diagnosis not present

## 2021-07-30 DIAGNOSIS — R262 Difficulty in walking, not elsewhere classified: Secondary | ICD-10-CM | POA: Diagnosis not present

## 2021-07-30 DIAGNOSIS — Z823 Family history of stroke: Secondary | ICD-10-CM | POA: Diagnosis not present

## 2021-07-30 DIAGNOSIS — R2681 Unsteadiness on feet: Secondary | ICD-10-CM | POA: Diagnosis not present

## 2021-07-30 DIAGNOSIS — U099 Post covid-19 condition, unspecified: Secondary | ICD-10-CM | POA: Diagnosis not present

## 2021-07-31 DIAGNOSIS — R2681 Unsteadiness on feet: Secondary | ICD-10-CM | POA: Diagnosis not present

## 2021-07-31 DIAGNOSIS — R262 Difficulty in walking, not elsewhere classified: Secondary | ICD-10-CM | POA: Diagnosis not present

## 2021-07-31 DIAGNOSIS — Z823 Family history of stroke: Secondary | ICD-10-CM | POA: Diagnosis not present

## 2021-07-31 DIAGNOSIS — U099 Post covid-19 condition, unspecified: Secondary | ICD-10-CM | POA: Diagnosis not present

## 2021-08-21 DIAGNOSIS — R2681 Unsteadiness on feet: Secondary | ICD-10-CM | POA: Diagnosis not present

## 2021-08-21 DIAGNOSIS — Z823 Family history of stroke: Secondary | ICD-10-CM | POA: Diagnosis not present

## 2021-08-21 DIAGNOSIS — U099 Post covid-19 condition, unspecified: Secondary | ICD-10-CM | POA: Diagnosis not present

## 2021-08-21 DIAGNOSIS — R262 Difficulty in walking, not elsewhere classified: Secondary | ICD-10-CM | POA: Diagnosis not present

## 2021-08-23 DIAGNOSIS — U099 Post covid-19 condition, unspecified: Secondary | ICD-10-CM | POA: Diagnosis not present

## 2021-08-23 DIAGNOSIS — Z823 Family history of stroke: Secondary | ICD-10-CM | POA: Diagnosis not present

## 2021-08-23 DIAGNOSIS — R2681 Unsteadiness on feet: Secondary | ICD-10-CM | POA: Diagnosis not present

## 2021-08-23 DIAGNOSIS — R262 Difficulty in walking, not elsewhere classified: Secondary | ICD-10-CM | POA: Diagnosis not present

## 2021-08-30 DIAGNOSIS — R262 Difficulty in walking, not elsewhere classified: Secondary | ICD-10-CM | POA: Diagnosis not present

## 2021-08-30 DIAGNOSIS — U099 Post covid-19 condition, unspecified: Secondary | ICD-10-CM | POA: Diagnosis not present

## 2021-08-30 DIAGNOSIS — Z823 Family history of stroke: Secondary | ICD-10-CM | POA: Diagnosis not present

## 2021-08-30 DIAGNOSIS — R2681 Unsteadiness on feet: Secondary | ICD-10-CM | POA: Diagnosis not present

## 2021-09-05 DIAGNOSIS — I10 Essential (primary) hypertension: Secondary | ICD-10-CM | POA: Diagnosis not present

## 2021-09-05 DIAGNOSIS — M170 Bilateral primary osteoarthritis of knee: Secondary | ICD-10-CM | POA: Diagnosis not present

## 2021-09-06 DIAGNOSIS — R2681 Unsteadiness on feet: Secondary | ICD-10-CM | POA: Diagnosis not present

## 2021-09-06 DIAGNOSIS — Z823 Family history of stroke: Secondary | ICD-10-CM | POA: Diagnosis not present

## 2021-09-06 DIAGNOSIS — U099 Post covid-19 condition, unspecified: Secondary | ICD-10-CM | POA: Diagnosis not present

## 2021-09-06 DIAGNOSIS — R262 Difficulty in walking, not elsewhere classified: Secondary | ICD-10-CM | POA: Diagnosis not present

## 2021-09-10 DIAGNOSIS — U099 Post covid-19 condition, unspecified: Secondary | ICD-10-CM | POA: Diagnosis not present

## 2021-09-10 DIAGNOSIS — R2681 Unsteadiness on feet: Secondary | ICD-10-CM | POA: Diagnosis not present

## 2021-09-10 DIAGNOSIS — R262 Difficulty in walking, not elsewhere classified: Secondary | ICD-10-CM | POA: Diagnosis not present

## 2021-09-10 DIAGNOSIS — Z823 Family history of stroke: Secondary | ICD-10-CM | POA: Diagnosis not present

## 2021-09-12 DIAGNOSIS — U099 Post covid-19 condition, unspecified: Secondary | ICD-10-CM | POA: Diagnosis not present

## 2021-09-12 DIAGNOSIS — R2681 Unsteadiness on feet: Secondary | ICD-10-CM | POA: Diagnosis not present

## 2021-09-12 DIAGNOSIS — Z823 Family history of stroke: Secondary | ICD-10-CM | POA: Diagnosis not present

## 2021-09-12 DIAGNOSIS — R262 Difficulty in walking, not elsewhere classified: Secondary | ICD-10-CM | POA: Diagnosis not present

## 2024-03-13 ENCOUNTER — Encounter (HOSPITAL_COMMUNITY): Payer: Self-pay | Admitting: Interventional Radiology
